# Patient Record
Sex: Female | Born: 1935 | Race: White | Hispanic: No | Smoking: Never smoker
Health system: Southern US, Community
[De-identification: ages and names within clinical notes are randomized; demographics above are authoritative.]

## PROBLEM LIST (undated history)

## (undated) DIAGNOSIS — C801 Malignant (primary) neoplasm, unspecified: Secondary | ICD-10-CM

## (undated) DIAGNOSIS — E785 Hyperlipidemia, unspecified: Secondary | ICD-10-CM

## (undated) DIAGNOSIS — Z973 Presence of spectacles and contact lenses: Secondary | ICD-10-CM

## (undated) DIAGNOSIS — K219 Gastro-esophageal reflux disease without esophagitis: Secondary | ICD-10-CM

## (undated) DIAGNOSIS — D649 Anemia, unspecified: Secondary | ICD-10-CM

## (undated) DIAGNOSIS — I10 Essential (primary) hypertension: Secondary | ICD-10-CM

## (undated) DIAGNOSIS — I2510 Atherosclerotic heart disease of native coronary artery without angina pectoris: Secondary | ICD-10-CM

## (undated) DIAGNOSIS — R0602 Shortness of breath: Secondary | ICD-10-CM

## (undated) DIAGNOSIS — I219 Acute myocardial infarction, unspecified: Secondary | ICD-10-CM

## (undated) HISTORY — PX: ABDOMINAL HYSTERECTOMY: SHX81

## (undated) HISTORY — DX: Acute myocardial infarction, unspecified: I21.9

## (undated) HISTORY — PX: APPENDECTOMY: SHX54

## (undated) HISTORY — DX: Hyperlipidemia, unspecified: E78.5

## (undated) HISTORY — DX: Anemia, unspecified: D64.9

## (undated) HISTORY — PX: TONSILLECTOMY: SUR1361

## (undated) HISTORY — PX: BACK SURGERY: SHX140

## (undated) HISTORY — PX: EYE SURGERY: SHX253

---

## 2001-05-16 HISTORY — PX: COLON SURGERY: SHX602

## 2001-08-14 HISTORY — PX: CORONARY ARTERY BYPASS GRAFT: SHX141

## 2007-06-19 ENCOUNTER — Ambulatory Visit: Payer: Self-pay | Admitting: Oncology

## 2007-06-28 LAB — CBC & DIFF AND RETIC
Basophils Absolute: 0 10*3/uL (ref 0.0–0.1)
Eosinophils Absolute: 0.1 10*3/uL (ref 0.0–0.5)
HGB: 8.9 g/dL — ABNORMAL LOW (ref 11.6–15.9)
IRF: 0.5 — ABNORMAL HIGH (ref 0.130–0.330)
MCV: 87.4 fL (ref 81.0–101.0)
MONO#: 0.8 10*3/uL (ref 0.1–0.9)
MONO%: 10.7 % (ref 0.0–13.0)
NEUT#: 4.3 10*3/uL (ref 1.5–6.5)
RDW: 20.5 % — ABNORMAL HIGH (ref 11.3–14.5)
RETIC #: 66 10*3/uL (ref 19.7–115.1)
Retic %: 2.3 % (ref 0.4–2.3)
WBC: 7.7 10*3/uL (ref 3.9–10.0)
lymph#: 2.4 10*3/uL (ref 0.9–3.3)

## 2007-06-28 LAB — CHCC SMEAR

## 2007-07-02 LAB — COMPREHENSIVE METABOLIC PANEL
ALT: 10 U/L (ref 0–35)
AST: 15 U/L (ref 0–37)
Albumin: 3.9 g/dL (ref 3.5–5.2)
Alkaline Phosphatase: 57 U/L (ref 39–117)
Calcium: 8.9 mg/dL (ref 8.4–10.5)
Chloride: 95 mEq/L — ABNORMAL LOW (ref 96–112)
Potassium: 5 mEq/L (ref 3.5–5.3)
Sodium: 128 mEq/L — ABNORMAL LOW (ref 135–145)
Total Protein: 7 g/dL (ref 6.0–8.3)

## 2007-07-02 LAB — IMMUNOFIXATION ELECTROPHORESIS
IgA: 1140 mg/dL — ABNORMAL HIGH (ref 68–378)
IgM, Serum: 8 mg/dL — ABNORMAL LOW (ref 60–263)
Total Protein, Serum Electrophoresis: 7 g/dL (ref 6.0–8.3)

## 2007-07-02 LAB — VITAMIN B12: Vitamin B-12: 506 pg/mL (ref 211–911)

## 2007-07-02 LAB — FERRITIN: Ferritin: 241 ng/mL (ref 10–291)

## 2007-07-19 LAB — CBC WITH DIFFERENTIAL/PLATELET
BASO%: 0.6 % (ref 0.0–2.0)
EOS%: 1.8 % (ref 0.0–7.0)
LYMPH%: 37.5 % (ref 14.0–48.0)
MCH: 30.4 pg (ref 26.0–34.0)
MCHC: 33.7 g/dL (ref 32.0–36.0)
MCV: 90.1 fL (ref 81.0–101.0)
MONO%: 8.5 % (ref 0.0–13.0)
Platelets: 236 10*3/uL (ref 145–400)
RBC: 3.31 10*6/uL — ABNORMAL LOW (ref 3.70–5.32)

## 2007-07-19 LAB — FECAL OCCULT BLOOD, GUAIAC: Occult Blood: NEGATIVE

## 2007-07-23 ENCOUNTER — Ambulatory Visit (HOSPITAL_COMMUNITY): Admission: RE | Admit: 2007-07-23 | Discharge: 2007-07-23 | Payer: Self-pay | Admitting: Oncology

## 2007-07-27 LAB — UIFE/LIGHT CHAINS/TP QN, 24-HR UR
Free Kappa Lt Chains,Ur: 5.39 mg/dL — ABNORMAL HIGH (ref 0.04–1.51)
Free Kappa/Lambda Ratio: 35.93 ratio — ABNORMAL HIGH (ref 0.46–4.00)
Free Lt Chn Excr Rate: 61.99 mg/d
Gamma Globulin, Urine: DETECTED — AB
Time: 24 hours
Total Protein, Urine: 6.1 mg/dL

## 2007-07-27 LAB — CREATININE CLEARANCE, URINE, 24 HOUR
Collection Interval-CRCL: 24 hours
Creatinine Clearance: 58 mL/min — ABNORMAL LOW (ref 75–115)
Creatinine, 24H Ur: 489 mg/d — ABNORMAL LOW (ref 700–1800)
Urine Total Volume-CRCL: 1150 mL

## 2007-08-16 ENCOUNTER — Ambulatory Visit: Payer: Self-pay | Admitting: Oncology

## 2007-08-20 LAB — CBC WITH DIFFERENTIAL/PLATELET
Basophils Absolute: 0 10*3/uL (ref 0.0–0.1)
EOS%: 1.8 % (ref 0.0–7.0)
HCT: 28.1 % — ABNORMAL LOW (ref 34.8–46.6)
HGB: 9.5 g/dL — ABNORMAL LOW (ref 11.6–15.9)
MCH: 30.1 pg (ref 26.0–34.0)
MONO#: 0.5 10*3/uL (ref 0.1–0.9)
NEUT#: 3.6 10*3/uL (ref 1.5–6.5)
NEUT%: 55.9 % (ref 39.6–76.8)
RDW: 20.7 % — ABNORMAL HIGH (ref 11.3–14.5)
WBC: 6.4 10*3/uL (ref 3.9–10.0)
lymph#: 2.2 10*3/uL (ref 0.9–3.3)

## 2007-08-20 LAB — ERYTHROCYTE SEDIMENTATION RATE: Sed Rate: 66 mm/hr — ABNORMAL HIGH (ref 0–30)

## 2007-08-22 LAB — COMPREHENSIVE METABOLIC PANEL
ALT: 10 U/L (ref 0–35)
Albumin: 4.1 g/dL (ref 3.5–5.2)
Alkaline Phosphatase: 46 U/L (ref 39–117)
CO2: 23 mEq/L (ref 19–32)
Potassium: 4.6 mEq/L (ref 3.5–5.3)
Sodium: 135 mEq/L (ref 135–145)
Total Bilirubin: 0.4 mg/dL (ref 0.3–1.2)
Total Protein: 7.1 g/dL (ref 6.0–8.3)

## 2007-08-22 LAB — IGG, IGA, IGM
IgA: 1250 mg/dL — ABNORMAL HIGH (ref 68–378)
IgM, Serum: 8 mg/dL — ABNORMAL LOW (ref 60–263)

## 2007-08-22 LAB — TRANSFERRIN RECEPTOR, SOLUABLE: Transferrin Receptor, Soluble: 43.5 nmol/L

## 2007-08-22 LAB — FERRITIN: Ferritin: 109 ng/mL (ref 10–291)

## 2007-08-22 LAB — IRON AND TIBC: %SAT: 28 % (ref 20–55)

## 2007-08-31 ENCOUNTER — Encounter (HOSPITAL_COMMUNITY): Payer: Self-pay | Admitting: Oncology

## 2007-08-31 ENCOUNTER — Ambulatory Visit (HOSPITAL_COMMUNITY): Admission: RE | Admit: 2007-08-31 | Discharge: 2007-08-31 | Payer: Self-pay | Admitting: Oncology

## 2007-08-31 ENCOUNTER — Ambulatory Visit: Payer: Self-pay | Admitting: Oncology

## 2007-09-20 LAB — CBC WITH DIFFERENTIAL/PLATELET
Basophils Absolute: 0 10*3/uL (ref 0.0–0.1)
Eosinophils Absolute: 0.1 10*3/uL (ref 0.0–0.5)
HGB: 9.3 g/dL — ABNORMAL LOW (ref 11.6–15.9)
MCV: 89.1 fL (ref 81.0–101.0)
MONO#: 0.6 10*3/uL (ref 0.1–0.9)
MONO%: 8.9 % (ref 0.0–13.0)
NEUT#: 3.5 10*3/uL (ref 1.5–6.5)
Platelets: 234 10*3/uL (ref 145–400)
RBC: 3.09 10*6/uL — ABNORMAL LOW (ref 3.70–5.32)
RDW: 20.4 % — ABNORMAL HIGH (ref 11.3–14.5)
WBC: 6.8 10*3/uL (ref 3.9–10.0)

## 2007-09-22 LAB — TRANSFERRIN RECEPTOR, SOLUABLE: Transferrin Receptor, Soluble: 44.1 nmol/L

## 2007-09-22 LAB — IRON AND TIBC
%SAT: 28 % (ref 20–55)
Iron: 77 ug/dL (ref 42–145)
UIBC: 197 ug/dL

## 2007-09-22 LAB — COMPREHENSIVE METABOLIC PANEL
Albumin: 3.9 g/dL (ref 3.5–5.2)
Alkaline Phosphatase: 61 U/L (ref 39–117)
BUN: 13 mg/dL (ref 6–23)
CO2: 22 mEq/L (ref 19–32)
Calcium: 8.8 mg/dL (ref 8.4–10.5)
Glucose, Bld: 109 mg/dL — ABNORMAL HIGH (ref 70–99)
Potassium: 5 mEq/L (ref 3.5–5.3)
Sodium: 132 mEq/L — ABNORMAL LOW (ref 135–145)
Total Protein: 7 g/dL (ref 6.0–8.3)

## 2007-09-22 LAB — FERRITIN: Ferritin: 114 ng/mL (ref 10–291)

## 2007-10-25 ENCOUNTER — Ambulatory Visit: Payer: Self-pay | Admitting: Oncology

## 2007-10-30 LAB — CBC WITH DIFFERENTIAL/PLATELET
Basophils Absolute: 0.1 10*3/uL (ref 0.0–0.1)
Eosinophils Absolute: 0.1 10*3/uL (ref 0.0–0.5)
HGB: 10.6 g/dL — ABNORMAL LOW (ref 11.6–15.9)
NEUT#: 3.4 10*3/uL (ref 1.5–6.5)
RBC: 3.49 10*6/uL — ABNORMAL LOW (ref 3.70–5.32)
RDW: 18 % — ABNORMAL HIGH (ref 11.3–14.5)
WBC: 6.6 10*3/uL (ref 3.9–10.0)
lymph#: 2.5 10*3/uL (ref 0.9–3.3)

## 2007-11-01 LAB — IGA: IgA: 1260 mg/dL — ABNORMAL HIGH (ref 68–378)

## 2007-11-01 LAB — COMPREHENSIVE METABOLIC PANEL
AST: 15 U/L (ref 0–37)
Alkaline Phosphatase: 52 U/L (ref 39–117)
BUN: 11 mg/dL (ref 6–23)
Creatinine, Ser: 0.64 mg/dL (ref 0.40–1.20)
Potassium: 4.8 mEq/L (ref 3.5–5.3)

## 2007-11-01 LAB — FERRITIN: Ferritin: 209 ng/mL (ref 10–291)

## 2007-11-01 LAB — IRON AND TIBC
%SAT: 31 % (ref 20–55)
Iron: 81 ug/dL (ref 42–145)
UIBC: 183 ug/dL

## 2007-11-01 LAB — TRANSFERRIN RECEPTOR, SOLUABLE: Transferrin Receptor, Soluble: 37 nmol/L

## 2007-11-22 LAB — CBC WITH DIFFERENTIAL/PLATELET
BASO%: 1.1 % (ref 0.0–2.0)
Basophils Absolute: 0.1 10*3/uL (ref 0.0–0.1)
EOS%: 1.8 % (ref 0.0–7.0)
HGB: 10.5 g/dL — ABNORMAL LOW (ref 11.6–15.9)
MCH: 30.7 pg (ref 26.0–34.0)
MCHC: 33.1 g/dL (ref 32.0–36.0)
MCV: 93 fL (ref 81.0–101.0)
MONO%: 8.2 % (ref 0.0–13.0)
RBC: 3.42 10*6/uL — ABNORMAL LOW (ref 3.70–5.32)
RDW: 18.2 % — ABNORMAL HIGH (ref 11.3–14.5)
lymph#: 2.1 10*3/uL (ref 0.9–3.3)

## 2007-11-22 LAB — COMPREHENSIVE METABOLIC PANEL
Albumin: 4 g/dL (ref 3.5–5.2)
Alkaline Phosphatase: 46 U/L (ref 39–117)
CO2: 23 mEq/L (ref 19–32)
Calcium: 9.1 mg/dL (ref 8.4–10.5)
Chloride: 102 mEq/L (ref 96–112)
Glucose, Bld: 158 mg/dL — ABNORMAL HIGH (ref 70–99)
Potassium: 4.4 mEq/L (ref 3.5–5.3)
Sodium: 134 mEq/L — ABNORMAL LOW (ref 135–145)
Total Protein: 7.1 g/dL (ref 6.0–8.3)

## 2007-11-22 LAB — IGA: IgA: 1150 mg/dL — ABNORMAL HIGH (ref 68–378)

## 2007-11-22 LAB — VITAMIN B12: Vitamin B-12: 353 pg/mL (ref 211–911)

## 2007-11-22 LAB — IRON AND TIBC: %SAT: 43 % (ref 20–55)

## 2007-11-22 LAB — LACTATE DEHYDROGENASE: LDH: 167 U/L (ref 94–250)

## 2007-12-17 ENCOUNTER — Ambulatory Visit: Payer: Self-pay | Admitting: Oncology

## 2007-12-20 LAB — CBC WITH DIFFERENTIAL/PLATELET
Basophils Absolute: 0 10*3/uL (ref 0.0–0.1)
EOS%: 0.9 % (ref 0.0–7.0)
Eosinophils Absolute: 0.1 10*3/uL (ref 0.0–0.5)
HGB: 10.8 g/dL — ABNORMAL LOW (ref 11.6–15.9)
MCV: 93.2 fL (ref 81.0–101.0)
MONO%: 12.1 % (ref 0.0–13.0)
NEUT#: 3.7 10*3/uL (ref 1.5–6.5)
RBC: 3.41 10*6/uL — ABNORMAL LOW (ref 3.70–5.32)
RDW: 19.8 % — ABNORMAL HIGH (ref 11.3–14.5)
lymph#: 1.8 10*3/uL (ref 0.9–3.3)

## 2007-12-20 LAB — VITAMIN B12: Vitamin B-12: 345 pg/mL (ref 211–911)

## 2007-12-20 LAB — COMPREHENSIVE METABOLIC PANEL
AST: 16 U/L (ref 0–37)
Albumin: 4.2 g/dL (ref 3.5–5.2)
Alkaline Phosphatase: 49 U/L (ref 39–117)
BUN: 15 mg/dL (ref 6–23)
Calcium: 8.7 mg/dL (ref 8.4–10.5)
Chloride: 98 mEq/L (ref 96–112)
Glucose, Bld: 138 mg/dL — ABNORMAL HIGH (ref 70–99)
Potassium: 4.3 mEq/L (ref 3.5–5.3)
Sodium: 128 mEq/L — ABNORMAL LOW (ref 135–145)
Total Protein: 7.4 g/dL (ref 6.0–8.3)

## 2007-12-20 LAB — FERRITIN: Ferritin: 589 ng/mL — ABNORMAL HIGH (ref 10–291)

## 2007-12-20 LAB — IRON AND TIBC
%SAT: 28 % (ref 20–55)
Iron: 69 ug/dL (ref 42–145)
TIBC: 246 ug/dL — ABNORMAL LOW (ref 250–470)

## 2008-01-18 LAB — COMPREHENSIVE METABOLIC PANEL
BUN: 9 mg/dL (ref 6–23)
CO2: 23 mEq/L (ref 19–32)
Calcium: 9.2 mg/dL (ref 8.4–10.5)
Chloride: 103 mEq/L (ref 96–112)
Creatinine, Ser: 0.66 mg/dL (ref 0.40–1.20)
Glucose, Bld: 207 mg/dL — ABNORMAL HIGH (ref 70–99)
Total Bilirubin: 0.4 mg/dL (ref 0.3–1.2)

## 2008-01-18 LAB — CBC WITH DIFFERENTIAL/PLATELET
Basophils Absolute: 0 10*3/uL (ref 0.0–0.1)
EOS%: 2.4 % (ref 0.0–7.0)
Eosinophils Absolute: 0.1 10*3/uL (ref 0.0–0.5)
HCT: 30.3 % — ABNORMAL LOW (ref 34.8–46.6)
HGB: 10.4 g/dL — ABNORMAL LOW (ref 11.6–15.9)
MCH: 32.5 pg (ref 26.0–34.0)
MONO#: 0.4 10*3/uL (ref 0.1–0.9)
NEUT#: 3 10*3/uL (ref 1.5–6.5)
NEUT%: 57.5 % (ref 39.6–76.8)
RDW: 19.3 % — ABNORMAL HIGH (ref 11.3–14.5)
lymph#: 1.6 10*3/uL (ref 0.9–3.3)

## 2008-01-18 LAB — LACTATE DEHYDROGENASE: LDH: 165 U/L (ref 94–250)

## 2008-01-18 LAB — IGA: IgA: 1090 mg/dL — ABNORMAL HIGH (ref 68–378)

## 2008-02-13 ENCOUNTER — Ambulatory Visit: Payer: Self-pay | Admitting: Oncology

## 2008-03-13 LAB — CBC WITH DIFFERENTIAL/PLATELET
Eosinophils Absolute: 0.1 10*3/uL (ref 0.0–0.5)
HCT: 27.8 % — ABNORMAL LOW (ref 34.8–46.6)
LYMPH%: 27.6 % (ref 14.0–48.0)
MONO#: 0.4 10*3/uL (ref 0.1–0.9)
NEUT#: 3.5 10*3/uL (ref 1.5–6.5)
NEUT%: 64 % (ref 39.6–76.8)
Platelets: 170 10*3/uL (ref 145–400)
WBC: 5.5 10*3/uL (ref 3.9–10.0)

## 2008-03-13 LAB — COMPREHENSIVE METABOLIC PANEL
ALT: 12 U/L (ref 0–35)
Albumin: 4.1 g/dL (ref 3.5–5.2)
Alkaline Phosphatase: 55 U/L (ref 39–117)
CO2: 23 mEq/L (ref 19–32)
Glucose, Bld: 206 mg/dL — ABNORMAL HIGH (ref 70–99)
Potassium: 4.6 mEq/L (ref 3.5–5.3)
Sodium: 134 mEq/L — ABNORMAL LOW (ref 135–145)
Total Protein: 7 g/dL (ref 6.0–8.3)

## 2008-03-13 LAB — IRON AND TIBC: UIBC: 141 ug/dL

## 2008-03-13 LAB — FERRITIN: Ferritin: 586 ng/mL — ABNORMAL HIGH (ref 10–291)

## 2008-03-13 LAB — LACTATE DEHYDROGENASE: LDH: 157 U/L (ref 94–250)

## 2008-03-13 LAB — IGA: IgA: 1240 mg/dL — ABNORMAL HIGH (ref 68–378)

## 2008-04-08 ENCOUNTER — Ambulatory Visit: Payer: Self-pay | Admitting: Oncology

## 2008-05-13 LAB — COMPREHENSIVE METABOLIC PANEL
Albumin: 4.2 g/dL (ref 3.5–5.2)
Alkaline Phosphatase: 57 U/L (ref 39–117)
BUN: 12 mg/dL (ref 6–23)
CO2: 22 mEq/L (ref 19–32)
Calcium: 8.7 mg/dL (ref 8.4–10.5)
Chloride: 103 mEq/L (ref 96–112)
Glucose, Bld: 202 mg/dL — ABNORMAL HIGH (ref 70–99)
Potassium: 4.4 mEq/L (ref 3.5–5.3)
Total Protein: 7.6 g/dL (ref 6.0–8.3)

## 2008-05-13 LAB — IRON AND TIBC
Iron: 79 ug/dL (ref 42–145)
UIBC: 160 ug/dL

## 2008-05-13 LAB — CBC WITH DIFFERENTIAL/PLATELET
Basophils Absolute: 0 10*3/uL (ref 0.0–0.1)
Eosinophils Absolute: 0.1 10*3/uL (ref 0.0–0.5)
HGB: 10.8 g/dL — ABNORMAL LOW (ref 11.6–15.9)
MCV: 95.9 fL (ref 81.0–101.0)
MONO#: 0.4 10*3/uL (ref 0.1–0.9)
MONO%: 7.4 % (ref 0.0–13.0)
NEUT#: 3.1 10*3/uL (ref 1.5–6.5)
RDW: 17.7 % — ABNORMAL HIGH (ref 11.3–14.5)
WBC: 5.2 10*3/uL (ref 3.9–10.0)

## 2008-05-13 LAB — LACTATE DEHYDROGENASE: LDH: 148 U/L (ref 94–250)

## 2008-05-13 LAB — FERRITIN: Ferritin: 449 ng/mL — ABNORMAL HIGH (ref 10–291)

## 2008-05-14 ENCOUNTER — Ambulatory Visit (HOSPITAL_COMMUNITY): Admission: RE | Admit: 2008-05-14 | Discharge: 2008-05-14 | Payer: Self-pay | Admitting: Oncology

## 2008-05-30 ENCOUNTER — Ambulatory Visit: Payer: Self-pay | Admitting: Oncology

## 2008-06-30 LAB — COMPREHENSIVE METABOLIC PANEL
ALT: 13 U/L (ref 0–35)
AST: 15 U/L (ref 0–37)
Albumin: 4.4 g/dL (ref 3.5–5.2)
Alkaline Phosphatase: 62 U/L (ref 39–117)
BUN: 13 mg/dL (ref 6–23)
CO2: 23 mEq/L (ref 19–32)
Calcium: 9.5 mg/dL (ref 8.4–10.5)
Creatinine, Ser: 0.77 mg/dL (ref 0.40–1.20)
Glucose, Bld: 186 mg/dL — ABNORMAL HIGH (ref 70–99)
Sodium: 136 mEq/L (ref 135–145)
Total Bilirubin: 0.4 mg/dL (ref 0.3–1.2)

## 2008-06-30 LAB — CBC WITH DIFFERENTIAL/PLATELET
Eosinophils Absolute: 0.1 10*3/uL (ref 0.0–0.5)
HCT: 30.6 % — ABNORMAL LOW (ref 34.8–46.6)
LYMPH%: 38.5 % (ref 14.0–48.0)
MCV: 94.6 fL (ref 81.0–101.0)
MONO#: 0.4 10*3/uL (ref 0.1–0.9)
MONO%: 8.4 % (ref 0.0–13.0)
NEUT#: 2.8 10*3/uL (ref 1.5–6.5)
NEUT%: 51.6 % (ref 39.6–76.8)
Platelets: 173 10*3/uL (ref 145–400)
RBC: 3.24 10*6/uL — ABNORMAL LOW (ref 3.70–5.32)

## 2008-06-30 LAB — LACTATE DEHYDROGENASE: LDH: 141 U/L (ref 94–250)

## 2008-07-25 ENCOUNTER — Ambulatory Visit: Payer: Self-pay | Admitting: Oncology

## 2008-07-29 LAB — CBC WITH DIFFERENTIAL/PLATELET
Basophils Absolute: 0 10*3/uL (ref 0.0–0.1)
Eosinophils Absolute: 0.1 10*3/uL (ref 0.0–0.5)
HCT: 29.2 % — ABNORMAL LOW (ref 34.8–46.6)
HGB: 9.8 g/dL — ABNORMAL LOW (ref 11.6–15.9)
LYMPH%: 35.3 % (ref 14.0–49.7)
MCV: 94.8 fL (ref 79.5–101.0)
MONO%: 9.2 % (ref 0.0–14.0)
NEUT#: 3.1 10*3/uL (ref 1.5–6.5)
NEUT%: 53.7 % (ref 38.4–76.8)
Platelets: 177 10*3/uL (ref 145–400)

## 2008-07-29 LAB — COMPREHENSIVE METABOLIC PANEL
ALT: 17 U/L (ref 0–35)
AST: 15 U/L (ref 0–37)
Albumin: 4.2 g/dL (ref 3.5–5.2)
Alkaline Phosphatase: 58 U/L (ref 39–117)
Calcium: 9.3 mg/dL (ref 8.4–10.5)
Chloride: 97 mEq/L (ref 96–112)
Potassium: 4.7 mEq/L (ref 3.5–5.3)

## 2008-08-26 LAB — COMPREHENSIVE METABOLIC PANEL
ALT: 17 U/L (ref 0–35)
Albumin: 3.5 g/dL (ref 3.5–5.2)
CO2: 26 mEq/L (ref 19–32)
Calcium: 9.1 mg/dL (ref 8.4–10.5)
Chloride: 97 mEq/L (ref 96–112)
Creatinine, Ser: 0.71 mg/dL (ref 0.40–1.20)
Total Protein: 7.6 g/dL (ref 6.0–8.3)

## 2008-08-26 LAB — CBC WITH DIFFERENTIAL/PLATELET
Basophils Absolute: 0 10*3/uL (ref 0.0–0.1)
EOS%: 2.2 % (ref 0.0–7.0)
Eosinophils Absolute: 0.1 10*3/uL (ref 0.0–0.5)
HCT: 27 % — ABNORMAL LOW (ref 34.8–46.6)
HGB: 9.3 g/dL — ABNORMAL LOW (ref 11.6–15.9)
MCH: 32.4 pg (ref 25.1–34.0)
MCV: 93.6 fL (ref 79.5–101.0)
MONO%: 8.6 % (ref 0.0–14.0)
NEUT%: 52.1 % (ref 38.4–76.8)

## 2008-08-27 LAB — IGA: IgA: 1630 mg/dL — ABNORMAL HIGH (ref 68–378)

## 2008-08-27 LAB — KAPPA/LAMBDA LIGHT CHAINS: Lambda Free Lght Chn: 0.74 mg/dL (ref 0.57–2.63)

## 2008-08-31 ENCOUNTER — Emergency Department (HOSPITAL_COMMUNITY): Admission: EM | Admit: 2008-08-31 | Discharge: 2008-08-31 | Payer: Self-pay | Admitting: Emergency Medicine

## 2008-09-23 ENCOUNTER — Ambulatory Visit: Payer: Self-pay | Admitting: Oncology

## 2008-09-25 LAB — CBC WITH DIFFERENTIAL/PLATELET
BASO%: 0.5 % (ref 0.0–2.0)
MCHC: 33.7 g/dL (ref 31.5–36.0)
MONO#: 0.5 10*3/uL (ref 0.1–0.9)
RBC: 2.77 10*6/uL — ABNORMAL LOW (ref 3.70–5.45)
WBC: 5.4 10*3/uL (ref 3.9–10.3)
lymph#: 1.7 10*3/uL (ref 0.9–3.3)

## 2008-09-26 LAB — COMPREHENSIVE METABOLIC PANEL
Albumin: 4 g/dL (ref 3.5–5.2)
Alkaline Phosphatase: 50 U/L (ref 39–117)
BUN: 7 mg/dL (ref 6–23)
CO2: 24 mEq/L (ref 19–32)
Chloride: 100 mEq/L (ref 96–112)
Creatinine, Ser: 0.64 mg/dL (ref 0.40–1.20)
Glucose, Bld: 156 mg/dL — ABNORMAL HIGH (ref 70–99)
Sodium: 134 mEq/L — ABNORMAL LOW (ref 135–145)

## 2008-09-26 LAB — IGA: IgA: 1540 mg/dL — ABNORMAL HIGH (ref 68–378)

## 2008-09-26 LAB — BETA 2 MICROGLOBULIN, SERUM: Beta-2 Microglobulin: 3.42 mg/L — ABNORMAL HIGH (ref 1.01–1.73)

## 2008-10-16 LAB — CBC WITH DIFFERENTIAL/PLATELET
Basophils Absolute: 0.1 10*3/uL (ref 0.0–0.1)
EOS%: 1.8 % (ref 0.0–7.0)
Eosinophils Absolute: 0.1 10*3/uL (ref 0.0–0.5)
HGB: 10.2 g/dL — ABNORMAL LOW (ref 11.6–15.9)
LYMPH%: 34.7 % (ref 14.0–49.7)
MCH: 30.3 pg (ref 25.1–34.0)
MCV: 94.1 fL (ref 79.5–101.0)
MONO%: 8.3 % (ref 0.0–14.0)
NEUT#: 4 10*3/uL (ref 1.5–6.5)
Platelets: 148 10*3/uL (ref 145–400)
RBC: 3.37 10*6/uL — ABNORMAL LOW (ref 3.70–5.45)

## 2008-11-03 ENCOUNTER — Ambulatory Visit (HOSPITAL_COMMUNITY): Admission: RE | Admit: 2008-11-03 | Discharge: 2008-11-03 | Payer: Self-pay | Admitting: Oncology

## 2008-11-04 ENCOUNTER — Ambulatory Visit: Payer: Self-pay | Admitting: Oncology

## 2008-11-06 LAB — CBC WITH DIFFERENTIAL/PLATELET
BASO%: 0.5 % (ref 0.0–2.0)
EOS%: 0.8 % (ref 0.0–7.0)
HCT: 30.8 % — ABNORMAL LOW (ref 34.8–46.6)
LYMPH%: 25.2 % (ref 14.0–49.7)
MCH: 29.6 pg (ref 25.1–34.0)
MCHC: 32.1 g/dL (ref 31.5–36.0)
MCV: 92.2 fL (ref 79.5–101.0)
MONO#: 0.6 10*3/uL (ref 0.1–0.9)
MONO%: 10.3 % (ref 0.0–14.0)
NEUT%: 63.2 % (ref 38.4–76.8)
Platelets: 120 10*3/uL — ABNORMAL LOW (ref 145–400)

## 2008-11-07 LAB — COMPREHENSIVE METABOLIC PANEL
ALT: 12 U/L (ref 0–35)
CO2: 22 mEq/L (ref 19–32)
Creatinine, Ser: 0.62 mg/dL (ref 0.40–1.20)
Total Bilirubin: 0.4 mg/dL (ref 0.3–1.2)

## 2008-11-07 LAB — LACTATE DEHYDROGENASE: LDH: 136 U/L (ref 94–250)

## 2008-11-07 LAB — KAPPA/LAMBDA LIGHT CHAINS
Kappa:Lambda Ratio: 15.12 — ABNORMAL HIGH (ref 0.26–1.65)
Lambda Free Lght Chn: 0.84 mg/dL (ref 0.57–2.63)

## 2008-11-10 LAB — UIFE/LIGHT CHAINS/TP QN, 24-HR UR
Free Kappa Lt Chains,Ur: 2.07 mg/dL — ABNORMAL HIGH (ref 0.04–1.51)
Free Kappa/Lambda Ratio: 34.5 ratio — ABNORMAL HIGH (ref 0.46–4.00)
Free Lt Chn Excr Rate: 32.09 mg/d
Total Protein, Urine: 2.6 mg/dL

## 2008-11-10 LAB — CREATININE CLEARANCE, URINE, 24 HOUR
Collection Interval-CRCL: 24 hours
Creatinine Clearance: 65 mL/min — ABNORMAL LOW (ref 75–115)
Creatinine, 24H Ur: 559 mg/d — ABNORMAL LOW (ref 700–1800)
Creatinine, Urine: 36.1 mg/dL

## 2008-11-27 LAB — CBC WITH DIFFERENTIAL/PLATELET
BASO%: 0.3 % (ref 0.0–2.0)
Basophils Absolute: 0 10*3/uL (ref 0.0–0.1)
EOS%: 1.8 % (ref 0.0–7.0)
HCT: 30.9 % — ABNORMAL LOW (ref 34.8–46.6)
HGB: 10.4 g/dL — ABNORMAL LOW (ref 11.6–15.9)
LYMPH%: 39 % (ref 14.0–49.7)
MCH: 30.8 pg (ref 25.1–34.0)
MCHC: 33.7 g/dL (ref 31.5–36.0)
NEUT%: 49 % (ref 38.4–76.8)
Platelets: 157 10*3/uL (ref 145–400)
lymph#: 1.9 10*3/uL (ref 0.9–3.3)

## 2008-12-16 ENCOUNTER — Ambulatory Visit: Payer: Self-pay | Admitting: Oncology

## 2008-12-18 LAB — CBC WITH DIFFERENTIAL/PLATELET
BASO%: 0.4 % (ref 0.0–2.0)
Basophils Absolute: 0 10*3/uL (ref 0.0–0.1)
HCT: 30 % — ABNORMAL LOW (ref 34.8–46.6)
HGB: 10.2 g/dL — ABNORMAL LOW (ref 11.6–15.9)
MCHC: 34 g/dL (ref 31.5–36.0)
MONO#: 0.3 10*3/uL (ref 0.1–0.9)
NEUT#: 3 10*3/uL (ref 1.5–6.5)
NEUT%: 56.7 % (ref 38.4–76.8)
WBC: 5.3 10*3/uL (ref 3.9–10.3)
lymph#: 1.8 10*3/uL (ref 0.9–3.3)

## 2008-12-18 LAB — COMPREHENSIVE METABOLIC PANEL
ALT: 12 U/L (ref 0–35)
CO2: 23 mEq/L (ref 19–32)
Calcium: 9.5 mg/dL (ref 8.4–10.5)
Chloride: 97 mEq/L (ref 96–112)
Creatinine, Ser: 0.64 mg/dL (ref 0.40–1.20)

## 2008-12-18 LAB — KAPPA/LAMBDA LIGHT CHAINS: Kappa:Lambda Ratio: 20.21 — ABNORMAL HIGH (ref 0.26–1.65)

## 2008-12-18 LAB — LACTATE DEHYDROGENASE: LDH: 188 U/L (ref 94–250)

## 2009-01-08 LAB — CBC WITH DIFFERENTIAL/PLATELET
BASO%: 0.4 % (ref 0.0–2.0)
Basophils Absolute: 0 10*3/uL (ref 0.0–0.1)
Eosinophils Absolute: 0.1 10*3/uL (ref 0.0–0.5)
HCT: 27.8 % — ABNORMAL LOW (ref 34.8–46.6)
HGB: 9.4 g/dL — ABNORMAL LOW (ref 11.6–15.9)
LYMPH%: 37.7 % (ref 14.0–49.7)
MONO#: 0.5 10*3/uL (ref 0.1–0.9)
NEUT#: 3.1 10*3/uL (ref 1.5–6.5)
NEUT%: 51.8 % (ref 38.4–76.8)
Platelets: 130 10*3/uL — ABNORMAL LOW (ref 145–400)
WBC: 6 10*3/uL (ref 3.9–10.3)
lymph#: 2.3 10*3/uL (ref 0.9–3.3)

## 2009-01-27 ENCOUNTER — Ambulatory Visit: Payer: Self-pay | Admitting: Oncology

## 2009-01-29 LAB — CBC WITH DIFFERENTIAL/PLATELET
BASO%: 2.4 % — ABNORMAL HIGH (ref 0.0–2.0)
Eosinophils Absolute: 0.1 10*3/uL (ref 0.0–0.5)
HCT: 29.7 % — ABNORMAL LOW (ref 34.8–46.6)
MCHC: 32.3 g/dL (ref 31.5–36.0)
MONO#: 0.5 10*3/uL (ref 0.1–0.9)
NEUT#: 2.5 10*3/uL (ref 1.5–6.5)
NEUT%: 50 % (ref 38.4–76.8)
RBC: 3.3 10*6/uL — ABNORMAL LOW (ref 3.70–5.45)
WBC: 5.1 10*3/uL (ref 3.9–10.3)
lymph#: 1.8 10*3/uL (ref 0.9–3.3)
nRBC: 9 % — ABNORMAL HIGH (ref 0–0)

## 2009-01-30 LAB — COMPREHENSIVE METABOLIC PANEL
ALT: 14 U/L (ref 0–35)
AST: 19 U/L (ref 0–37)
Albumin: 4.2 g/dL (ref 3.5–5.2)
Alkaline Phosphatase: 54 U/L (ref 39–117)
Potassium: 4.7 mEq/L (ref 3.5–5.3)
Sodium: 133 mEq/L — ABNORMAL LOW (ref 135–145)
Total Protein: 7.4 g/dL (ref 6.0–8.3)

## 2009-01-30 LAB — VITAMIN B12: Vitamin B-12: 374 pg/mL (ref 211–911)

## 2009-02-19 LAB — CBC WITH DIFFERENTIAL/PLATELET
Eosinophils Absolute: 0.1 10*3/uL (ref 0.0–0.5)
HCT: 28.4 % — ABNORMAL LOW (ref 34.8–46.6)
LYMPH%: 35.5 % (ref 14.0–49.7)
MCHC: 33.2 g/dL (ref 31.5–36.0)
MCV: 89.8 fL (ref 79.5–101.0)
MONO%: 6.1 % (ref 0.0–14.0)
NEUT#: 2.9 10*3/uL (ref 1.5–6.5)
NEUT%: 56.5 % (ref 38.4–76.8)
Platelets: 130 10*3/uL — ABNORMAL LOW (ref 145–400)
RBC: 3.16 10*6/uL — ABNORMAL LOW (ref 3.70–5.45)

## 2009-03-09 ENCOUNTER — Ambulatory Visit: Payer: Self-pay | Admitting: Oncology

## 2009-03-12 LAB — CBC WITH DIFFERENTIAL/PLATELET
BASO%: 0.7 % (ref 0.0–2.0)
EOS%: 1.4 % (ref 0.0–7.0)
HCT: 29 % — ABNORMAL LOW (ref 34.8–46.6)
LYMPH%: 33 % (ref 14.0–49.7)
MCH: 30.3 pg (ref 25.1–34.0)
MCHC: 33.4 g/dL (ref 31.5–36.0)
MCV: 90.7 fL (ref 79.5–101.0)
MONO#: 0.4 10*3/uL (ref 0.1–0.9)
MONO%: 7.9 % (ref 0.0–14.0)
NEUT%: 57 % (ref 38.4–76.8)
Platelets: 159 10*3/uL (ref 145–400)
RBC: 3.2 10*6/uL — ABNORMAL LOW (ref 3.70–5.45)
WBC: 5.4 10*3/uL (ref 3.9–10.3)

## 2009-03-12 LAB — COMPREHENSIVE METABOLIC PANEL
AST: 15 U/L (ref 0–37)
Albumin: 4.3 g/dL (ref 3.5–5.2)
Alkaline Phosphatase: 56 U/L (ref 39–117)
BUN: 10 mg/dL (ref 6–23)
Potassium: 4.4 mEq/L (ref 3.5–5.3)

## 2009-03-12 LAB — KAPPA/LAMBDA LIGHT CHAINS
Kappa:Lambda Ratio: 11.76 — ABNORMAL HIGH (ref 0.26–1.65)
Lambda Free Lght Chn: 0.83 mg/dL (ref 0.57–2.63)

## 2009-03-12 LAB — VITAMIN B12: Vitamin B-12: 384 pg/mL (ref 211–911)

## 2009-04-02 ENCOUNTER — Ambulatory Visit: Payer: Self-pay | Admitting: Oncology

## 2009-04-07 LAB — CBC WITH DIFFERENTIAL/PLATELET
Basophils Absolute: 0 10*3/uL (ref 0.0–0.1)
Eosinophils Absolute: 0.1 10*3/uL (ref 0.0–0.5)
HGB: 9.5 g/dL — ABNORMAL LOW (ref 11.6–15.9)
LYMPH%: 35.1 % (ref 14.0–49.7)
MCV: 91.1 fL (ref 79.5–101.0)
MONO#: 0.3 10*3/uL (ref 0.1–0.9)
MONO%: 6.4 % (ref 0.0–14.0)
NEUT#: 3.1 10*3/uL (ref 1.5–6.5)
Platelets: 167 10*3/uL (ref 145–400)

## 2009-05-05 ENCOUNTER — Ambulatory Visit: Payer: Self-pay | Admitting: Oncology

## 2009-05-05 LAB — CBC WITH DIFFERENTIAL/PLATELET
Basophils Absolute: 0 10*3/uL (ref 0.0–0.1)
Eosinophils Absolute: 0.1 10*3/uL (ref 0.0–0.5)
HCT: 28.3 % — ABNORMAL LOW (ref 34.8–46.6)
HGB: 9.4 g/dL — ABNORMAL LOW (ref 11.6–15.9)
LYMPH%: 35 % (ref 14.0–49.7)
MONO#: 0.4 10*3/uL (ref 0.1–0.9)
NEUT#: 2.9 10*3/uL (ref 1.5–6.5)
NEUT%: 55.4 % (ref 38.4–76.8)
Platelets: 151 10*3/uL (ref 145–400)
WBC: 5.2 10*3/uL (ref 3.9–10.3)
lymph#: 1.8 10*3/uL (ref 0.9–3.3)

## 2009-05-05 LAB — COMPREHENSIVE METABOLIC PANEL
ALT: 12 U/L (ref 0–35)
Albumin: 4.1 g/dL (ref 3.5–5.2)
CO2: 22 mEq/L (ref 19–32)
Calcium: 9.1 mg/dL (ref 8.4–10.5)
Chloride: 100 mEq/L (ref 96–112)
Potassium: 4.5 mEq/L (ref 3.5–5.3)
Sodium: 135 mEq/L (ref 135–145)
Total Protein: 7.7 g/dL (ref 6.0–8.3)

## 2009-05-05 LAB — IGG, IGA, IGM: IgG (Immunoglobin G), Serum: 408 mg/dL — ABNORMAL LOW (ref 694–1618)

## 2009-06-02 LAB — CBC WITH DIFFERENTIAL/PLATELET
Basophils Absolute: 0 10*3/uL (ref 0.0–0.1)
EOS%: 1.1 % (ref 0.0–7.0)
HGB: 9.1 g/dL — ABNORMAL LOW (ref 11.6–15.9)
MCH: 30.2 pg (ref 25.1–34.0)
MONO#: 0.3 10*3/uL (ref 0.1–0.9)
NEUT#: 2.9 10*3/uL (ref 1.5–6.5)
RDW: 21.9 % — ABNORMAL HIGH (ref 11.2–14.5)
WBC: 5.3 10*3/uL (ref 3.9–10.3)
lymph#: 2 10*3/uL (ref 0.9–3.3)

## 2009-06-03 LAB — IGG, IGA, IGM: IgA: 1850 mg/dL — ABNORMAL HIGH (ref 68–378)

## 2009-06-03 LAB — COMPREHENSIVE METABOLIC PANEL
ALT: 10 U/L (ref 0–35)
AST: 15 U/L (ref 0–37)
Albumin: 4.3 g/dL (ref 3.5–5.2)
BUN: 14 mg/dL (ref 6–23)
Calcium: 8.9 mg/dL (ref 8.4–10.5)
Chloride: 97 mEq/L (ref 96–112)
Potassium: 4.6 mEq/L (ref 3.5–5.3)

## 2009-06-03 LAB — KAPPA/LAMBDA LIGHT CHAINS: Kappa:Lambda Ratio: 23.88 — ABNORMAL HIGH (ref 0.26–1.65)

## 2009-06-26 ENCOUNTER — Ambulatory Visit: Payer: Self-pay | Admitting: Oncology

## 2009-06-30 LAB — CBC WITH DIFFERENTIAL/PLATELET
BASO%: 0.5 % (ref 0.0–2.0)
EOS%: 1.7 % (ref 0.0–7.0)
HCT: 28.4 % — ABNORMAL LOW (ref 34.8–46.6)
LYMPH%: 35.9 % (ref 14.0–49.7)
MCH: 30.3 pg (ref 25.1–34.0)
MCHC: 33.6 g/dL (ref 31.5–36.0)
MONO#: 0.4 10*3/uL (ref 0.1–0.9)
NEUT%: 54.9 % (ref 38.4–76.8)
Platelets: 166 10*3/uL (ref 145–400)
RBC: 3.15 10*6/uL — ABNORMAL LOW (ref 3.70–5.45)
WBC: 5.3 10*3/uL (ref 3.9–10.3)

## 2009-07-01 LAB — COMPREHENSIVE METABOLIC PANEL
ALT: 11 U/L (ref 0–35)
AST: 17 U/L (ref 0–37)
Alkaline Phosphatase: 57 U/L (ref 39–117)
BUN: 10 mg/dL (ref 6–23)
Creatinine, Ser: 0.61 mg/dL (ref 0.40–1.20)
Total Bilirubin: 0.5 mg/dL (ref 0.3–1.2)

## 2009-07-01 LAB — LACTATE DEHYDROGENASE: LDH: 219 U/L (ref 94–250)

## 2009-07-01 LAB — KAPPA/LAMBDA LIGHT CHAINS: Lambda Free Lght Chn: <0.47 mg/dL — ABNORMAL LOW (ref 0.57–2.63)

## 2009-07-01 LAB — VITAMIN B12: Vitamin B-12: 450 pg/mL (ref 211–911)

## 2009-07-24 ENCOUNTER — Ambulatory Visit: Payer: Self-pay | Admitting: Oncology

## 2009-07-28 LAB — COMPREHENSIVE METABOLIC PANEL
ALT: 12 U/L (ref 0–35)
AST: 17 U/L (ref 0–37)
Albumin: 4.2 g/dL (ref 3.5–5.2)
BUN: 10 mg/dL (ref 6–23)
Calcium: 8.7 mg/dL (ref 8.4–10.5)
Chloride: 100 mEq/L (ref 96–112)
Glucose, Bld: 155 mg/dL — ABNORMAL HIGH (ref 70–99)
Potassium: 4.4 mEq/L (ref 3.5–5.3)
Sodium: 133 mEq/L — ABNORMAL LOW (ref 135–145)
Total Bilirubin: 0.5 mg/dL (ref 0.3–1.2)
Total Protein: 7.2 g/dL (ref 6.0–8.3)

## 2009-07-28 LAB — CBC WITH DIFFERENTIAL/PLATELET
Eosinophils Absolute: 0.1 10*3/uL (ref 0.0–0.5)
HCT: 27.5 % — ABNORMAL LOW (ref 34.8–46.6)
HGB: 8.8 g/dL — ABNORMAL LOW (ref 11.6–15.9)
LYMPH%: 33.5 % (ref 14.0–49.7)
MONO#: 0.5 10*3/uL (ref 0.1–0.9)
NEUT#: 2.4 10*3/uL (ref 1.5–6.5)
NEUT%: 52 % (ref 38.4–76.8)
Platelets: 110 10*3/uL — ABNORMAL LOW (ref 145–400)
WBC: 4.7 10*3/uL (ref 3.9–10.3)

## 2009-07-28 LAB — VITAMIN B12: Vitamin B-12: 443 pg/mL (ref 211–911)

## 2009-07-29 LAB — KAPPA/LAMBDA LIGHT CHAINS: Kappa free light chain: 11.3 mg/dL — ABNORMAL HIGH (ref 0.33–1.94)

## 2009-08-05 ENCOUNTER — Ambulatory Visit: Payer: Self-pay | Admitting: Oncology

## 2009-08-05 ENCOUNTER — Ambulatory Visit (HOSPITAL_COMMUNITY): Admission: RE | Admit: 2009-08-05 | Discharge: 2009-08-05 | Payer: Self-pay | Admitting: Oncology

## 2009-08-11 LAB — CBC WITH DIFFERENTIAL/PLATELET
BASO%: 1.8 % (ref 0.0–2.0)
EOS%: 1.1 % (ref 0.0–7.0)
HCT: 26.9 % — ABNORMAL LOW (ref 34.8–46.6)
LYMPH%: 34.9 % (ref 14.0–49.7)
MCH: 28.4 pg (ref 25.1–34.0)
MCHC: 32 g/dL (ref 31.5–36.0)
MCV: 88.8 fL (ref 79.5–101.0)
MONO#: 0.7 10*3/uL (ref 0.1–0.9)
MONO%: 11.3 % (ref 0.0–14.0)
NEUT%: 50.9 % (ref 38.4–76.8)
Platelets: 109 10*3/uL — ABNORMAL LOW (ref 145–400)
RBC: 3.03 10*6/uL — ABNORMAL LOW (ref 3.70–5.45)

## 2009-08-11 LAB — COMPREHENSIVE METABOLIC PANEL
Albumin: 4.1 g/dL (ref 3.5–5.2)
BUN: 11 mg/dL (ref 6–23)
CO2: 20 mEq/L (ref 19–32)
Glucose, Bld: 122 mg/dL — ABNORMAL HIGH (ref 70–99)
Sodium: 130 mEq/L — ABNORMAL LOW (ref 135–145)
Total Bilirubin: 0.6 mg/dL (ref 0.3–1.2)
Total Protein: 7.7 g/dL (ref 6.0–8.3)

## 2009-08-11 LAB — IGA: IgA: 2080 mg/dL — ABNORMAL HIGH (ref 68–378)

## 2009-08-13 LAB — CREATININE CLEARANCE, URINE, 24 HOUR
Creatinine, Urine: 45.5 mg/dL
Creatinine: 0.69 mg/dL (ref 0.40–1.20)
Urine Total Volume-CRCL: 1200 mL

## 2009-08-13 LAB — UIFE/LIGHT CHAINS/TP QN, 24-HR UR
Albumin, U: DETECTED
Alpha 1, Urine: DETECTED — AB
Free Kappa/Lambda Ratio: 82 ratio — ABNORMAL HIGH (ref 0.46–4.00)
Free Lambda Excretion/Day: 1.08 mg/d
Time: 24 hours
Total Protein, Urine: 8 mg/dL

## 2009-08-21 LAB — CBC WITH DIFFERENTIAL/PLATELET
Eosinophils Absolute: 0.1 10*3/uL (ref 0.0–0.5)
HCT: 29.3 % — ABNORMAL LOW (ref 34.8–46.6)
HGB: 9.8 g/dL — ABNORMAL LOW (ref 11.6–15.9)
LYMPH%: 28.2 % (ref 14.0–49.7)
MONO#: 0.5 10*3/uL (ref 0.1–0.9)
NEUT#: 3.5 10*3/uL (ref 1.5–6.5)
NEUT%: 60.3 % (ref 38.4–76.8)
Platelets: 129 10*3/uL — ABNORMAL LOW (ref 145–400)
WBC: 5.9 10*3/uL (ref 3.9–10.3)
lymph#: 1.7 10*3/uL (ref 0.9–3.3)

## 2009-08-23 LAB — FERRITIN: Ferritin: 278 ng/mL (ref 10–291)

## 2009-08-23 LAB — TRANSFERRIN RECEPTOR, SOLUABLE: Transferrin Receptor, Soluble: 60.6 nmol/L

## 2009-08-23 LAB — IRON AND TIBC
%SAT: 27 % (ref 20–55)
Iron: 83 ug/dL (ref 42–145)
UIBC: 226 ug/dL

## 2009-08-25 ENCOUNTER — Ambulatory Visit: Payer: Self-pay | Admitting: Oncology

## 2009-08-27 LAB — CBC WITH DIFFERENTIAL/PLATELET
Basophils Absolute: 0 10*3/uL (ref 0.0–0.1)
EOS%: 1.4 % (ref 0.0–7.0)
Eosinophils Absolute: 0.1 10*3/uL (ref 0.0–0.5)
HGB: 9.9 g/dL — ABNORMAL LOW (ref 11.6–15.9)
LYMPH%: 35.3 % (ref 14.0–49.7)
MCH: 28.5 pg (ref 25.1–34.0)
MCV: 86.2 fL (ref 79.5–101.0)
MONO%: 15 % — ABNORMAL HIGH (ref 0.0–14.0)
NEUT#: 2 10*3/uL (ref 1.5–6.5)
Platelets: 118 10*3/uL — ABNORMAL LOW (ref 145–400)
RBC: 3.47 10*6/uL — ABNORMAL LOW (ref 3.70–5.45)
RDW: 19.9 % — ABNORMAL HIGH (ref 11.2–14.5)

## 2009-08-27 LAB — TECHNOLOGIST REVIEW

## 2009-09-03 LAB — CBC WITH DIFFERENTIAL/PLATELET
BASO%: 1.2 % (ref 0.0–2.0)
Basophils Absolute: 0 10*3/uL (ref 0.0–0.1)
EOS%: 2.7 % (ref 0.0–7.0)
HCT: 28.7 % — ABNORMAL LOW (ref 34.8–46.6)
HGB: 9.3 g/dL — ABNORMAL LOW (ref 11.6–15.9)
LYMPH%: 47.7 % (ref 14.0–49.7)
MCH: 28.6 pg (ref 25.1–34.0)
MCHC: 32.4 g/dL (ref 31.5–36.0)
MCV: 88.3 fL (ref 79.5–101.0)
MONO%: 12.4 % (ref 0.0–14.0)
NEUT%: 36 % — ABNORMAL LOW (ref 38.4–76.8)

## 2009-09-03 LAB — TECHNOLOGIST REVIEW

## 2009-09-06 LAB — COMPREHENSIVE METABOLIC PANEL
ALT: 15 U/L (ref 0–35)
CO2: 22 mEq/L (ref 19–32)
Calcium: 8.4 mg/dL (ref 8.4–10.5)
Chloride: 97 mEq/L (ref 96–112)
Creatinine, Ser: 0.64 mg/dL (ref 0.40–1.20)
Sodium: 131 mEq/L — ABNORMAL LOW (ref 135–145)
Total Protein: 6.5 g/dL (ref 6.0–8.3)

## 2009-09-06 LAB — LACTATE DEHYDROGENASE: LDH: 124 U/L (ref 94–250)

## 2009-09-14 LAB — CBC WITH DIFFERENTIAL/PLATELET
BASO%: 1.1 % (ref 0.0–2.0)
LYMPH%: 41.1 % (ref 14.0–49.7)
MCHC: 33.5 g/dL (ref 31.5–36.0)
MONO#: 0.7 10*3/uL (ref 0.1–0.9)
MONO%: 12.7 % (ref 0.0–14.0)
Platelets: 171 10*3/uL (ref 145–400)
RBC: 3.18 10*6/uL — ABNORMAL LOW (ref 3.70–5.45)
WBC: 5.2 10*3/uL (ref 3.9–10.3)

## 2009-09-22 LAB — CBC WITH DIFFERENTIAL/PLATELET
BASO%: 0.7 % (ref 0.0–2.0)
HCT: 27.5 % — ABNORMAL LOW (ref 34.8–46.6)
MCHC: 33.7 g/dL (ref 31.5–36.0)
MONO#: 0.3 10*3/uL (ref 0.1–0.9)
RBC: 3.05 10*6/uL — ABNORMAL LOW (ref 3.70–5.45)
WBC: 4.5 10*3/uL (ref 3.9–10.3)
lymph#: 1.4 10*3/uL (ref 0.9–3.3)

## 2009-09-23 LAB — VITAMIN D 25 HYDROXY (VIT D DEFICIENCY, FRACTURES): Vit D, 25-Hydroxy: 35 ng/mL (ref 30–89)

## 2009-09-23 LAB — COMPREHENSIVE METABOLIC PANEL
ALT: 10 U/L (ref 0–35)
AST: 13 U/L (ref 0–37)
Albumin: 4 g/dL (ref 3.5–5.2)
Calcium: 8.6 mg/dL (ref 8.4–10.5)
Chloride: 100 mEq/L (ref 96–112)
Potassium: 4.5 mEq/L (ref 3.5–5.3)
Sodium: 136 mEq/L (ref 135–145)

## 2009-09-23 LAB — KAPPA/LAMBDA LIGHT CHAINS: Lambda Free Lght Chn: 0.43 mg/dL — ABNORMAL LOW (ref 0.57–2.63)

## 2009-09-30 ENCOUNTER — Ambulatory Visit: Payer: Self-pay | Admitting: Oncology

## 2009-10-01 LAB — CBC WITH DIFFERENTIAL/PLATELET
Basophils Absolute: 0.1 10*3/uL (ref 0.0–0.1)
Eosinophils Absolute: 0.2 10*3/uL (ref 0.0–0.5)
HGB: 9.5 g/dL — ABNORMAL LOW (ref 11.6–15.9)
MCV: 89.4 fL (ref 79.5–101.0)
MONO#: 0.4 10*3/uL (ref 0.1–0.9)
MONO%: 7.6 % (ref 0.0–14.0)
NEUT#: 3.1 10*3/uL (ref 1.5–6.5)
RBC: 3.29 10*6/uL — ABNORMAL LOW (ref 3.70–5.45)
RDW: 20.3 % — ABNORMAL HIGH (ref 11.2–14.5)
WBC: 5.8 10*3/uL (ref 3.9–10.3)
lymph#: 2 10*3/uL (ref 0.9–3.3)
nRBC: 3 % — ABNORMAL HIGH (ref 0–0)

## 2009-10-22 LAB — CBC WITH DIFFERENTIAL/PLATELET
BASO%: 0.9 % (ref 0.0–2.0)
Basophils Absolute: 0 10*3/uL (ref 0.0–0.1)
EOS%: 2.3 % (ref 0.0–7.0)
Eosinophils Absolute: 0.1 10*3/uL (ref 0.0–0.5)
HCT: 29 % — ABNORMAL LOW (ref 34.8–46.6)
HGB: 9.8 g/dL — ABNORMAL LOW (ref 11.6–15.9)
LYMPH%: 40.7 % (ref 14.0–49.7)
MCH: 30.6 pg (ref 25.1–34.0)
MCHC: 33.7 g/dL (ref 31.5–36.0)
MCV: 90.7 fL (ref 79.5–101.0)
MONO#: 0.3 10*3/uL (ref 0.1–0.9)
MONO%: 6.4 % (ref 0.0–14.0)
NEUT#: 2 10*3/uL (ref 1.5–6.5)
NEUT%: 49.7 % (ref 38.4–76.8)
Platelets: 187 10*3/uL (ref 145–400)
RBC: 3.2 10*6/uL — ABNORMAL LOW (ref 3.70–5.45)
RDW: 21.7 % — ABNORMAL HIGH (ref 11.2–14.5)
WBC: 4 10*3/uL (ref 3.9–10.3)
lymph#: 1.6 10*3/uL (ref 0.9–3.3)

## 2009-10-22 LAB — COMPREHENSIVE METABOLIC PANEL
ALT: 14 U/L (ref 0–35)
AST: 15 U/L (ref 0–37)
Albumin: 4 g/dL (ref 3.5–5.2)
Alkaline Phosphatase: 50 U/L (ref 39–117)
BUN: 10 mg/dL (ref 6–23)
CO2: 23 mEq/L (ref 19–32)
Calcium: 8.7 mg/dL (ref 8.4–10.5)
Chloride: 95 mEq/L — ABNORMAL LOW (ref 96–112)
Creatinine, Ser: 0.71 mg/dL (ref 0.40–1.20)
Glucose, Bld: 122 mg/dL — ABNORMAL HIGH (ref 70–99)
Potassium: 4.6 mEq/L (ref 3.5–5.3)
Sodium: 129 mEq/L — ABNORMAL LOW (ref 135–145)
Total Bilirubin: 0.4 mg/dL (ref 0.3–1.2)
Total Protein: 6.9 g/dL (ref 6.0–8.3)

## 2009-10-22 LAB — IGA: IgA: 1010 mg/dL — ABNORMAL HIGH (ref 68–378)

## 2009-10-22 LAB — LACTATE DEHYDROGENASE: LDH: 115 U/L (ref 94–250)

## 2009-10-23 LAB — KAPPA/LAMBDA LIGHT CHAINS
Kappa:Lambda Ratio: 7.07 — ABNORMAL HIGH (ref 0.26–1.65)
Lambda Free Lght Chn: 1 mg/dL (ref 0.57–2.63)

## 2009-11-03 ENCOUNTER — Ambulatory Visit: Payer: Self-pay | Admitting: Oncology

## 2009-11-05 LAB — CBC WITH DIFFERENTIAL/PLATELET
Eosinophils Absolute: 0.1 10*3/uL (ref 0.0–0.5)
LYMPH%: 49.2 % (ref 14.0–49.7)
MCH: 30.2 pg (ref 25.1–34.0)
MCHC: 33.3 g/dL (ref 31.5–36.0)
MCV: 90.5 fL (ref 79.5–101.0)
MONO%: 17.2 % — ABNORMAL HIGH (ref 0.0–14.0)
NEUT#: 1.4 10*3/uL — ABNORMAL LOW (ref 1.5–6.5)
Platelets: 122 10*3/uL — ABNORMAL LOW (ref 145–400)
RBC: 3.2 10*6/uL — ABNORMAL LOW (ref 3.70–5.45)

## 2009-11-19 LAB — CBC WITH DIFFERENTIAL/PLATELET
Basophils Absolute: 0 10*3/uL (ref 0.0–0.1)
Eosinophils Absolute: 0.1 10*3/uL (ref 0.0–0.5)
HCT: 32.6 % — ABNORMAL LOW (ref 34.8–46.6)
HGB: 10.9 g/dL — ABNORMAL LOW (ref 11.6–15.9)
MCH: 30.4 pg (ref 25.1–34.0)
MCV: 90.9 fL (ref 79.5–101.0)
MONO%: 11.7 % (ref 0.0–14.0)
NEUT#: 2.4 10*3/uL (ref 1.5–6.5)
NEUT%: 51.3 % (ref 38.4–76.8)
RDW: 21.2 % — ABNORMAL HIGH (ref 11.2–14.5)

## 2009-11-19 LAB — COMPREHENSIVE METABOLIC PANEL
Albumin: 4.3 g/dL (ref 3.5–5.2)
BUN: 12 mg/dL (ref 6–23)
Calcium: 9 mg/dL (ref 8.4–10.5)
Chloride: 99 mEq/L (ref 96–112)
Creatinine, Ser: 0.75 mg/dL (ref 0.40–1.20)
Glucose, Bld: 169 mg/dL — ABNORMAL HIGH (ref 70–99)
Potassium: 4.4 mEq/L (ref 3.5–5.3)

## 2009-11-19 LAB — IGA: IgA: 885 mg/dL — ABNORMAL HIGH (ref 68–378)

## 2009-12-03 ENCOUNTER — Ambulatory Visit: Payer: Self-pay | Admitting: Oncology

## 2009-12-03 LAB — CBC WITH DIFFERENTIAL/PLATELET
Basophils Absolute: 0 10*3/uL (ref 0.0–0.1)
EOS%: 3.5 % (ref 0.0–7.0)
Eosinophils Absolute: 0.2 10*3/uL (ref 0.0–0.5)
HGB: 10.3 g/dL — ABNORMAL LOW (ref 11.6–15.9)
LYMPH%: 43.6 % (ref 14.0–49.7)
MCH: 29.4 pg (ref 25.1–34.0)
MCV: 91.4 fL (ref 79.5–101.0)
MONO%: 7.3 % (ref 0.0–14.0)
NEUT%: 44.9 % (ref 38.4–76.8)
Platelets: 167 10*3/uL (ref 145–400)
RDW: 20.3 % — ABNORMAL HIGH (ref 11.2–14.5)

## 2009-12-22 LAB — CBC WITH DIFFERENTIAL/PLATELET
BASO%: 0.8 % (ref 0.0–2.0)
EOS%: 4.4 % (ref 0.0–7.0)
LYMPH%: 45.3 % (ref 14.0–49.7)
MCH: 31 pg (ref 25.1–34.0)
MCHC: 33.9 g/dL (ref 31.5–36.0)
MCV: 91.4 fL (ref 79.5–101.0)
MONO%: 8 % (ref 0.0–14.0)
NEUT#: 1.4 10*3/uL — ABNORMAL LOW (ref 1.5–6.5)
Platelets: 160 10*3/uL (ref 145–400)
RBC: 3.39 10*6/uL — ABNORMAL LOW (ref 3.70–5.45)
RDW: 20.2 % — ABNORMAL HIGH (ref 11.2–14.5)

## 2009-12-23 LAB — COMPREHENSIVE METABOLIC PANEL
ALT: 17 U/L (ref 0–35)
AST: 19 U/L (ref 0–37)
Albumin: 4.4 g/dL (ref 3.5–5.2)
Alkaline Phosphatase: 40 U/L (ref 39–117)
Glucose, Bld: 123 mg/dL — ABNORMAL HIGH (ref 70–99)
Potassium: 3.9 mEq/L (ref 3.5–5.3)
Sodium: 134 mEq/L — ABNORMAL LOW (ref 135–145)
Total Bilirubin: 0.4 mg/dL (ref 0.3–1.2)
Total Protein: 6.9 g/dL (ref 6.0–8.3)

## 2009-12-23 LAB — IGA: IgA: 872 mg/dL — ABNORMAL HIGH (ref 68–378)

## 2010-01-12 ENCOUNTER — Ambulatory Visit: Payer: Self-pay | Admitting: Oncology

## 2010-01-14 LAB — CBC WITH DIFFERENTIAL/PLATELET
BASO%: 0.8 % (ref 0.0–2.0)
EOS%: 3.5 % (ref 0.0–7.0)
HCT: 33.1 % — ABNORMAL LOW (ref 34.8–46.6)
MCH: 29 pg (ref 25.1–34.0)
MCHC: 31.4 g/dL — ABNORMAL LOW (ref 31.5–36.0)
MONO%: 8 % (ref 0.0–14.0)
NEUT%: 50.9 % (ref 38.4–76.8)
lymph#: 1.5 10*3/uL (ref 0.9–3.3)

## 2010-02-09 ENCOUNTER — Ambulatory Visit (HOSPITAL_COMMUNITY): Admission: RE | Admit: 2010-02-09 | Discharge: 2010-02-09 | Payer: Self-pay | Admitting: Oncology

## 2010-02-09 LAB — COMPREHENSIVE METABOLIC PANEL
ALT: 17 U/L (ref 0–35)
AST: 19 U/L (ref 0–37)
Albumin: 3.7 g/dL (ref 3.5–5.2)
CO2: 25 mEq/L (ref 19–32)
Calcium: 8.5 mg/dL (ref 8.4–10.5)
Chloride: 92 mEq/L — ABNORMAL LOW (ref 96–112)
Creatinine, Ser: 0.67 mg/dL (ref 0.40–1.20)
Potassium: 4.7 mEq/L (ref 3.5–5.3)

## 2010-02-09 LAB — CBC WITH DIFFERENTIAL/PLATELET
BASO%: 0.5 % (ref 0.0–2.0)
Eosinophils Absolute: 0.1 10*3/uL (ref 0.0–0.5)
HCT: 27.9 % — ABNORMAL LOW (ref 34.8–46.6)
LYMPH%: 31.5 % (ref 14.0–49.7)
MONO#: 0.5 10*3/uL (ref 0.1–0.9)
NEUT#: 3.1 10*3/uL (ref 1.5–6.5)
NEUT%: 56 % (ref 38.4–76.8)
Platelets: 183 10*3/uL (ref 145–400)
WBC: 5.4 10*3/uL (ref 3.9–10.3)
lymph#: 1.7 10*3/uL (ref 0.9–3.3)

## 2010-02-09 LAB — IGA: IgA: 655 mg/dL — ABNORMAL HIGH (ref 68–378)

## 2010-02-09 LAB — LACTATE DEHYDROGENASE: LDH: 99 U/L (ref 94–250)

## 2010-03-09 ENCOUNTER — Ambulatory Visit: Payer: Self-pay | Admitting: Oncology

## 2010-03-11 LAB — CBC WITH DIFFERENTIAL/PLATELET
Basophils Absolute: 0 10*3/uL (ref 0.0–0.1)
HCT: 30.1 % — ABNORMAL LOW (ref 34.8–46.6)
HGB: 10.2 g/dL — ABNORMAL LOW (ref 11.6–15.9)
MONO#: 0.6 10*3/uL (ref 0.1–0.9)
NEUT%: 31.7 % — ABNORMAL LOW (ref 38.4–76.8)
WBC: 4 10*3/uL (ref 3.9–10.3)
lymph#: 2.1 10*3/uL (ref 0.9–3.3)

## 2010-03-11 LAB — COMPREHENSIVE METABOLIC PANEL
ALT: 19 U/L (ref 0–35)
BUN: 10 mg/dL (ref 6–23)
CO2: 24 mEq/L (ref 19–32)
Calcium: 8.8 mg/dL (ref 8.4–10.5)
Chloride: 102 mEq/L (ref 96–112)
Creatinine, Ser: 0.67 mg/dL (ref 0.40–1.20)

## 2010-03-11 LAB — LACTATE DEHYDROGENASE: LDH: 105 U/L (ref 94–250)

## 2010-03-12 LAB — IGA: IgA: 680 mg/dL — ABNORMAL HIGH (ref 68–378)

## 2010-03-12 LAB — VITAMIN D 25 HYDROXY (VIT D DEFICIENCY, FRACTURES): Vit D, 25-Hydroxy: 34 ng/mL (ref 30–89)

## 2010-03-18 LAB — CBC WITH DIFFERENTIAL/PLATELET
BASO%: 1.1 % (ref 0.0–2.0)
Eosinophils Absolute: 0.1 10*3/uL (ref 0.0–0.5)
HCT: 32.4 % — ABNORMAL LOW (ref 34.8–46.6)
LYMPH%: 37.2 % (ref 14.0–49.7)
MCHC: 34 g/dL (ref 31.5–36.0)
MONO#: 0.8 10*3/uL (ref 0.1–0.9)
NEUT#: 2.8 10*3/uL (ref 1.5–6.5)
NEUT%: 46.8 % (ref 38.4–76.8)
Platelets: 221 10*3/uL (ref 145–400)
WBC: 5.9 10*3/uL (ref 3.9–10.3)
lymph#: 2.2 10*3/uL (ref 0.9–3.3)

## 2010-04-09 ENCOUNTER — Ambulatory Visit: Payer: Self-pay | Admitting: Oncology

## 2010-04-13 LAB — CBC WITH DIFFERENTIAL/PLATELET
Basophils Absolute: 0 10*3/uL (ref 0.0–0.1)
HCT: 32.8 % — ABNORMAL LOW (ref 34.8–46.6)
HGB: 10.7 g/dL — ABNORMAL LOW (ref 11.6–15.9)
LYMPH%: 34.4 % (ref 14.0–49.7)
MCH: 30.5 pg (ref 25.1–34.0)
MCHC: 32.6 g/dL (ref 31.5–36.0)
MONO#: 0.8 10*3/uL (ref 0.1–0.9)
NEUT%: 46.7 % (ref 38.4–76.8)
Platelets: 186 10*3/uL (ref 145–400)
WBC: 5.1 10*3/uL (ref 3.9–10.3)
lymph#: 1.8 10*3/uL (ref 0.9–3.3)

## 2010-04-13 LAB — COMPREHENSIVE METABOLIC PANEL
ALT: 20 U/L (ref 0–35)
Albumin: 3.5 g/dL (ref 3.5–5.2)
CO2: 25 mEq/L (ref 19–32)
Calcium: 8.5 mg/dL (ref 8.4–10.5)
Chloride: 102 mEq/L (ref 96–112)
Glucose, Bld: 135 mg/dL — ABNORMAL HIGH (ref 70–99)
Sodium: 135 mEq/L (ref 135–145)
Total Protein: 6.8 g/dL (ref 6.0–8.3)

## 2010-04-13 LAB — LACTATE DEHYDROGENASE: LDH: 317 U/L — ABNORMAL HIGH (ref 94–250)

## 2010-04-14 LAB — VITAMIN D 25 HYDROXY (VIT D DEFICIENCY, FRACTURES): Vit D, 25-Hydroxy: 32 ng/mL (ref 30–89)

## 2010-04-29 LAB — CBC WITH DIFFERENTIAL/PLATELET
Basophils Absolute: 0.1 10*3/uL (ref 0.0–0.1)
Eosinophils Absolute: 0.1 10*3/uL (ref 0.0–0.5)
HGB: 9.8 g/dL — ABNORMAL LOW (ref 11.6–15.9)
MCV: 90.6 fL (ref 79.5–101.0)
MONO#: 1.1 10*3/uL — ABNORMAL HIGH (ref 0.1–0.9)
NEUT#: 1.8 10*3/uL (ref 1.5–6.5)
RBC: 3.31 10*6/uL — ABNORMAL LOW (ref 3.70–5.45)
RDW: 16.9 % — ABNORMAL HIGH (ref 11.2–14.5)
WBC: 5.4 10*3/uL (ref 3.9–10.3)
lymph#: 2.3 10*3/uL (ref 0.9–3.3)
nRBC: 0 % (ref 0–0)

## 2010-05-12 ENCOUNTER — Ambulatory Visit: Payer: Self-pay | Admitting: Oncology

## 2010-05-14 LAB — CBC WITH DIFFERENTIAL/PLATELET
BASO%: 1.9 % (ref 0.0–2.0)
EOS%: 3.8 % (ref 0.0–7.0)
LYMPH%: 38.1 % (ref 14.0–49.7)
MCH: 29.6 pg (ref 25.1–34.0)
MCHC: 32.4 g/dL (ref 31.5–36.0)
MONO#: 0.7 10*3/uL (ref 0.1–0.9)
Platelets: 176 10*3/uL (ref 145–400)
RBC: 3.61 10*6/uL — ABNORMAL LOW (ref 3.70–5.45)
WBC: 4.2 10*3/uL (ref 3.9–10.3)

## 2010-05-14 LAB — BASIC METABOLIC PANEL
CO2: 21 mEq/L (ref 19–32)
Calcium: 9.1 mg/dL (ref 8.4–10.5)
Creatinine, Ser: 0.81 mg/dL (ref 0.40–1.20)
Sodium: 137 mEq/L (ref 135–145)

## 2010-05-14 LAB — SEDIMENTATION RATE: Sed Rate: 42 mm/hr — ABNORMAL HIGH (ref 0–22)

## 2010-05-20 LAB — CBC WITH DIFFERENTIAL/PLATELET
BASO%: 0.5 % (ref 0.0–2.0)
Basophils Absolute: 0 10*3/uL (ref 0.0–0.1)
EOS%: 2.9 % (ref 0.0–7.0)
Eosinophils Absolute: 0.1 10*3/uL (ref 0.0–0.5)
HCT: 31.9 % — ABNORMAL LOW (ref 34.8–46.6)
HGB: 10.2 g/dL — ABNORMAL LOW (ref 11.6–15.9)
LYMPH%: 37.9 % (ref 14.0–49.7)
MCH: 29.3 pg (ref 25.1–34.0)
MCHC: 32 g/dL (ref 31.5–36.0)
MCV: 91.7 fL (ref 79.5–101.0)
MONO#: 0.8 10*3/uL (ref 0.1–0.9)
MONO%: 19.9 % — ABNORMAL HIGH (ref 0.0–14.0)
NEUT#: 1.6 10*3/uL (ref 1.5–6.5)
NEUT%: 38.8 % (ref 38.4–76.8)
Platelets: 99 10*3/uL — ABNORMAL LOW (ref 145–400)
RBC: 3.48 10*6/uL — ABNORMAL LOW (ref 3.70–5.45)
RDW: 18 % — ABNORMAL HIGH (ref 11.2–14.5)
WBC: 4.2 10*3/uL (ref 3.9–10.3)
lymph#: 1.6 10*3/uL (ref 0.9–3.3)

## 2010-05-21 LAB — COMPREHENSIVE METABOLIC PANEL
ALT: 17 U/L (ref 0–35)
AST: 15 U/L (ref 0–37)
Albumin: 4.1 g/dL (ref 3.5–5.2)
Alkaline Phosphatase: 60 U/L (ref 39–117)
BUN: 13 mg/dL (ref 6–23)
CO2: 23 mEq/L (ref 19–32)
Calcium: 8.4 mg/dL (ref 8.4–10.5)
Chloride: 103 mEq/L (ref 96–112)
Creatinine, Ser: 0.81 mg/dL (ref 0.40–1.20)
Glucose, Bld: 186 mg/dL — ABNORMAL HIGH (ref 70–99)
Potassium: 4.6 mEq/L (ref 3.5–5.3)
Sodium: 137 mEq/L (ref 135–145)
Total Bilirubin: 0.4 mg/dL (ref 0.3–1.2)
Total Protein: 6.9 g/dL (ref 6.0–8.3)

## 2010-05-21 LAB — LACTATE DEHYDROGENASE: LDH: 128 U/L (ref 94–250)

## 2010-05-21 LAB — VITAMIN D 25 HYDROXY (VIT D DEFICIENCY, FRACTURES): Vit D, 25-Hydroxy: 36 ng/mL (ref 30–89)

## 2010-05-21 LAB — IGA: IgA: 574 mg/dL — ABNORMAL HIGH (ref 68–378)

## 2010-05-21 LAB — VITAMIN B12: Vitamin B-12: 869 pg/mL (ref 211–911)

## 2010-06-06 ENCOUNTER — Encounter (HOSPITAL_COMMUNITY): Payer: Self-pay | Admitting: Oncology

## 2010-06-08 LAB — CBC WITH DIFFERENTIAL/PLATELET
BASO%: 0.4 % (ref 0.0–2.0)
EOS%: 2.6 % (ref 0.0–7.0)
MCH: 29.3 pg (ref 25.1–34.0)
MCHC: 32.7 g/dL (ref 31.5–36.0)
NEUT%: 40.3 % (ref 38.4–76.8)
RBC: 3.34 10*6/uL — ABNORMAL LOW (ref 3.70–5.45)
RDW: 17.5 % — ABNORMAL HIGH (ref 11.2–14.5)
WBC: 6.9 10*3/uL (ref 3.9–10.3)
lymph#: 2.9 10*3/uL (ref 0.9–3.3)

## 2010-06-22 ENCOUNTER — Encounter (HOSPITAL_BASED_OUTPATIENT_CLINIC_OR_DEPARTMENT_OTHER): Payer: Medicare Other | Admitting: Oncology

## 2010-06-22 ENCOUNTER — Other Ambulatory Visit (HOSPITAL_COMMUNITY): Payer: Self-pay | Admitting: Oncology

## 2010-06-22 DIAGNOSIS — R197 Diarrhea, unspecified: Secondary | ICD-10-CM

## 2010-06-22 DIAGNOSIS — D509 Iron deficiency anemia, unspecified: Secondary | ICD-10-CM

## 2010-06-22 DIAGNOSIS — E538 Deficiency of other specified B group vitamins: Secondary | ICD-10-CM

## 2010-06-22 DIAGNOSIS — C9000 Multiple myeloma not having achieved remission: Secondary | ICD-10-CM

## 2010-06-22 LAB — CBC WITH DIFFERENTIAL/PLATELET
BASO%: 0.7 % (ref 0.0–2.0)
EOS%: 3.5 % (ref 0.0–7.0)
Eosinophils Absolute: 0.3 10*3/uL (ref 0.0–0.5)
MCH: 29.6 pg (ref 25.1–34.0)
MCHC: 32.3 g/dL (ref 31.5–36.0)
MCV: 91.7 fL (ref 79.5–101.0)
MONO%: 4.7 % (ref 0.0–14.0)
NEUT#: 4.6 10*3/uL (ref 1.5–6.5)
RBC: 3.51 10*6/uL — ABNORMAL LOW (ref 3.70–5.45)
RDW: 18.6 % — ABNORMAL HIGH (ref 11.2–14.5)

## 2010-06-30 ENCOUNTER — Other Ambulatory Visit (HOSPITAL_COMMUNITY): Payer: Self-pay | Admitting: Oncology

## 2010-06-30 ENCOUNTER — Ambulatory Visit (HOSPITAL_COMMUNITY)
Admission: RE | Admit: 2010-06-30 | Discharge: 2010-06-30 | Disposition: A | Discharge status: Home / Self Care | Payer: Medicare Other | Source: Ambulatory Visit | Attending: Oncology | Admitting: Oncology

## 2010-06-30 DIAGNOSIS — C9000 Multiple myeloma not having achieved remission: Secondary | ICD-10-CM

## 2010-06-30 DIAGNOSIS — M129 Arthropathy, unspecified: Secondary | ICD-10-CM | POA: Insufficient documentation

## 2010-06-30 DIAGNOSIS — M19019 Primary osteoarthritis, unspecified shoulder: Secondary | ICD-10-CM | POA: Insufficient documentation

## 2010-06-30 DIAGNOSIS — M5030 Other cervical disc degeneration, unspecified cervical region: Secondary | ICD-10-CM | POA: Insufficient documentation

## 2010-06-30 DIAGNOSIS — M47812 Spondylosis without myelopathy or radiculopathy, cervical region: Secondary | ICD-10-CM | POA: Insufficient documentation

## 2010-06-30 DIAGNOSIS — Q762 Congenital spondylolisthesis: Secondary | ICD-10-CM | POA: Insufficient documentation

## 2010-07-06 ENCOUNTER — Encounter (HOSPITAL_BASED_OUTPATIENT_CLINIC_OR_DEPARTMENT_OTHER): Payer: Medicare Other | Admitting: Oncology

## 2010-07-06 ENCOUNTER — Other Ambulatory Visit (HOSPITAL_COMMUNITY): Payer: Self-pay | Admitting: Oncology

## 2010-07-06 DIAGNOSIS — R197 Diarrhea, unspecified: Secondary | ICD-10-CM

## 2010-07-06 DIAGNOSIS — D630 Anemia in neoplastic disease: Secondary | ICD-10-CM

## 2010-07-06 DIAGNOSIS — D509 Iron deficiency anemia, unspecified: Secondary | ICD-10-CM

## 2010-07-06 DIAGNOSIS — C9000 Multiple myeloma not having achieved remission: Secondary | ICD-10-CM

## 2010-07-06 DIAGNOSIS — E538 Deficiency of other specified B group vitamins: Secondary | ICD-10-CM

## 2010-07-06 LAB — CBC WITH DIFFERENTIAL/PLATELET
BASO%: 0.8 % (ref 0.0–2.0)
Eosinophils Absolute: 0.1 10*3/uL (ref 0.0–0.5)
MCHC: 31.7 g/dL (ref 31.5–36.0)
MONO#: 0.8 10*3/uL (ref 0.1–0.9)
NEUT#: 1.2 10*3/uL — ABNORMAL LOW (ref 1.5–6.5)
RBC: 3.05 10*6/uL — ABNORMAL LOW (ref 3.70–5.45)
RDW: 17.9 % — ABNORMAL HIGH (ref 11.2–14.5)
WBC: 4 10*3/uL (ref 3.9–10.3)
lymph#: 1.9 10*3/uL (ref 0.9–3.3)

## 2010-07-06 LAB — COMPREHENSIVE METABOLIC PANEL
Albumin: 3.3 g/dL — ABNORMAL LOW (ref 3.5–5.2)
Alkaline Phosphatase: 52 U/L (ref 39–117)
BUN: 12 mg/dL (ref 6–23)
Calcium: 8.5 mg/dL (ref 8.4–10.5)
Chloride: 99 mEq/L (ref 96–112)
Glucose, Bld: 134 mg/dL — ABNORMAL HIGH (ref 70–99)
Potassium: 4 mEq/L (ref 3.5–5.3)
Sodium: 134 mEq/L — ABNORMAL LOW (ref 135–145)
Total Protein: 6.3 g/dL (ref 6.0–8.3)

## 2010-07-06 LAB — VITAMIN D 25 HYDROXY (VIT D DEFICIENCY, FRACTURES): Vit D, 25-Hydroxy: 36 ng/mL (ref 30–89)

## 2010-07-13 ENCOUNTER — Other Ambulatory Visit (HOSPITAL_COMMUNITY): Payer: Self-pay | Admitting: Oncology

## 2010-07-13 ENCOUNTER — Encounter (HOSPITAL_BASED_OUTPATIENT_CLINIC_OR_DEPARTMENT_OTHER): Payer: Medicare Other | Admitting: Oncology

## 2010-07-13 DIAGNOSIS — E538 Deficiency of other specified B group vitamins: Secondary | ICD-10-CM

## 2010-07-13 DIAGNOSIS — D630 Anemia in neoplastic disease: Secondary | ICD-10-CM

## 2010-07-13 LAB — CBC WITH DIFFERENTIAL/PLATELET
Basophils Absolute: 0.1 10*3/uL (ref 0.0–0.1)
Eosinophils Absolute: 0.1 10*3/uL (ref 0.0–0.5)
HGB: 9 g/dL — ABNORMAL LOW (ref 11.6–15.9)
LYMPH%: 35.4 % (ref 14.0–49.7)
MCV: 92.5 fL (ref 79.5–101.0)
MONO%: 6.8 % (ref 0.0–14.0)
NEUT#: 3.7 10*3/uL (ref 1.5–6.5)
NEUT%: 54.1 % (ref 38.4–76.8)
Platelets: 259 10*3/uL (ref 145–400)

## 2010-07-16 ENCOUNTER — Encounter (HOSPITAL_COMMUNITY): Payer: Self-pay | Admitting: Radiology

## 2010-07-16 ENCOUNTER — Emergency Department (HOSPITAL_COMMUNITY): Payer: Medicare Other

## 2010-07-16 ENCOUNTER — Emergency Department (HOSPITAL_COMMUNITY)
Admission: EM | Admit: 2010-07-16 | Discharge: 2010-07-16 | Disposition: A | Discharge status: Home / Self Care | Payer: Medicare Other | Attending: Emergency Medicine | Admitting: Emergency Medicine

## 2010-07-16 DIAGNOSIS — S0003XA Contusion of scalp, initial encounter: Secondary | ICD-10-CM | POA: Insufficient documentation

## 2010-07-16 DIAGNOSIS — Z87898 Personal history of other specified conditions: Secondary | ICD-10-CM | POA: Insufficient documentation

## 2010-07-16 DIAGNOSIS — S7000XA Contusion of unspecified hip, initial encounter: Secondary | ICD-10-CM | POA: Insufficient documentation

## 2010-07-16 DIAGNOSIS — E119 Type 2 diabetes mellitus without complications: Secondary | ICD-10-CM | POA: Insufficient documentation

## 2010-07-16 DIAGNOSIS — Y92009 Unspecified place in unspecified non-institutional (private) residence as the place of occurrence of the external cause: Secondary | ICD-10-CM | POA: Insufficient documentation

## 2010-07-16 DIAGNOSIS — W010XXA Fall on same level from slipping, tripping and stumbling without subsequent striking against object, initial encounter: Secondary | ICD-10-CM | POA: Insufficient documentation

## 2010-07-16 DIAGNOSIS — I10 Essential (primary) hypertension: Secondary | ICD-10-CM | POA: Insufficient documentation

## 2010-07-16 DIAGNOSIS — I25810 Atherosclerosis of coronary artery bypass graft(s) without angina pectoris: Secondary | ICD-10-CM | POA: Insufficient documentation

## 2010-07-16 HISTORY — DX: Malignant (primary) neoplasm, unspecified: C80.1

## 2010-07-16 HISTORY — DX: Essential (primary) hypertension: I10

## 2010-08-08 LAB — DIFFERENTIAL
Basophils Absolute: 0 10*3/uL (ref 0.0–0.1)
Eosinophils Relative: 1 % (ref 0–5)
Lymphocytes Relative: 27 % (ref 12–46)
Monocytes Relative: 8 % (ref 3–12)
Neutrophils Relative %: 64 % (ref 43–77)

## 2010-08-08 LAB — CBC
HCT: 27.3 % — ABNORMAL LOW (ref 36.0–46.0)
Hemoglobin: 8.8 g/dL — ABNORMAL LOW (ref 12.0–15.0)
MCHC: 32.4 g/dL (ref 30.0–36.0)
MCV: 90.6 fL (ref 78.0–100.0)
RBC: 3.01 MIL/uL — ABNORMAL LOW (ref 3.87–5.11)

## 2010-08-08 LAB — BONE MARROW EXAM

## 2010-08-10 ENCOUNTER — Other Ambulatory Visit (HOSPITAL_COMMUNITY): Payer: Self-pay | Admitting: Oncology

## 2010-08-10 ENCOUNTER — Encounter (HOSPITAL_BASED_OUTPATIENT_CLINIC_OR_DEPARTMENT_OTHER): Payer: Medicare Other | Admitting: Oncology

## 2010-08-10 DIAGNOSIS — D630 Anemia in neoplastic disease: Secondary | ICD-10-CM

## 2010-08-10 DIAGNOSIS — D649 Anemia, unspecified: Secondary | ICD-10-CM

## 2010-08-10 DIAGNOSIS — E538 Deficiency of other specified B group vitamins: Secondary | ICD-10-CM

## 2010-08-10 DIAGNOSIS — C9000 Multiple myeloma not having achieved remission: Secondary | ICD-10-CM

## 2010-08-10 DIAGNOSIS — R634 Abnormal weight loss: Secondary | ICD-10-CM

## 2010-08-10 LAB — CBC WITH DIFFERENTIAL/PLATELET
BASO%: 1.1 % (ref 0.0–2.0)
Basophils Absolute: 0.1 10*3/uL (ref 0.0–0.1)
EOS%: 1.8 % (ref 0.0–7.0)
HCT: 30.2 % — ABNORMAL LOW (ref 34.8–46.6)
HGB: 9.8 g/dL — ABNORMAL LOW (ref 11.6–15.9)
MCH: 29.2 pg (ref 25.1–34.0)
MONO#: 0.4 10*3/uL (ref 0.1–0.9)
NEUT#: 3.1 10*3/uL (ref 1.5–6.5)
NEUT%: 56.3 % (ref 38.4–76.8)
RDW: 18.4 % — ABNORMAL HIGH (ref 11.2–14.5)
WBC: 5.5 10*3/uL (ref 3.9–10.3)
lymph#: 1.9 10*3/uL (ref 0.9–3.3)

## 2010-08-11 LAB — VITAMIN D 25 HYDROXY (VIT D DEFICIENCY, FRACTURES): Vit D, 25-Hydroxy: 40 ng/mL (ref 30–89)

## 2010-08-11 LAB — COMPREHENSIVE METABOLIC PANEL
AST: 15 U/L (ref 0–37)
Albumin: 4 g/dL (ref 3.5–5.2)
Alkaline Phosphatase: 78 U/L (ref 39–117)
BUN: 22 mg/dL (ref 6–23)
Calcium: 8.7 mg/dL (ref 8.4–10.5)
Chloride: 101 mEq/L (ref 96–112)
Glucose, Bld: 108 mg/dL — ABNORMAL HIGH (ref 70–99)
Potassium: 5.3 mEq/L (ref 3.5–5.3)
Sodium: 133 mEq/L — ABNORMAL LOW (ref 135–145)
Total Protein: 7.1 g/dL (ref 6.0–8.3)

## 2010-08-11 LAB — IGA: IgA: 685 mg/dL — ABNORMAL HIGH (ref 68–378)

## 2010-08-17 ENCOUNTER — Other Ambulatory Visit (HOSPITAL_COMMUNITY): Payer: Self-pay | Admitting: Oncology

## 2010-08-17 ENCOUNTER — Ambulatory Visit (HOSPITAL_COMMUNITY)
Admission: RE | Admit: 2010-08-17 | Discharge: 2010-08-17 | Disposition: A | Discharge status: Home / Self Care | Payer: Medicare Other | Source: Ambulatory Visit | Attending: Oncology | Admitting: Oncology

## 2010-08-17 DIAGNOSIS — C9000 Multiple myeloma not having achieved remission: Secondary | ICD-10-CM

## 2010-08-18 ENCOUNTER — Ambulatory Visit (HOSPITAL_COMMUNITY)
Admission: RE | Admit: 2010-08-18 | Discharge: 2010-08-18 | Disposition: A | Discharge status: Home / Self Care | Payer: Medicare Other | Source: Ambulatory Visit | Attending: Oncology | Admitting: Oncology

## 2010-08-18 DIAGNOSIS — R29898 Other symptoms and signs involving the musculoskeletal system: Secondary | ICD-10-CM | POA: Insufficient documentation

## 2010-08-18 DIAGNOSIS — C9000 Multiple myeloma not having achieved remission: Secondary | ICD-10-CM | POA: Insufficient documentation

## 2010-08-20 ENCOUNTER — Other Ambulatory Visit (HOSPITAL_COMMUNITY): Payer: Medicare Other

## 2010-09-07 ENCOUNTER — Encounter (HOSPITAL_BASED_OUTPATIENT_CLINIC_OR_DEPARTMENT_OTHER): Payer: Medicare Other | Admitting: Oncology

## 2010-09-07 ENCOUNTER — Other Ambulatory Visit (HOSPITAL_COMMUNITY): Payer: Self-pay | Admitting: Oncology

## 2010-09-07 DIAGNOSIS — E538 Deficiency of other specified B group vitamins: Secondary | ICD-10-CM

## 2010-09-07 DIAGNOSIS — C9000 Multiple myeloma not having achieved remission: Secondary | ICD-10-CM

## 2010-09-07 LAB — CBC WITH DIFFERENTIAL/PLATELET
BASO%: 0.5 % (ref 0.0–2.0)
Basophils Absolute: 0 10*3/uL (ref 0.0–0.1)
EOS%: 3.1 % (ref 0.0–7.0)
HCT: 32.1 % — ABNORMAL LOW (ref 34.8–46.6)
HGB: 10.7 g/dL — ABNORMAL LOW (ref 11.6–15.9)
LYMPH%: 35 % (ref 14.0–49.7)
MCH: 31.1 pg (ref 25.1–34.0)
MCHC: 33.3 g/dL (ref 31.5–36.0)
MCV: 93.5 fL (ref 79.5–101.0)
MONO%: 11.3 % (ref 0.0–14.0)
NEUT%: 50.1 % (ref 38.4–76.8)

## 2010-10-05 ENCOUNTER — Other Ambulatory Visit (HOSPITAL_COMMUNITY): Payer: Self-pay | Admitting: Oncology

## 2010-10-05 ENCOUNTER — Encounter (HOSPITAL_BASED_OUTPATIENT_CLINIC_OR_DEPARTMENT_OTHER): Payer: Medicare Other | Admitting: Oncology

## 2010-10-05 DIAGNOSIS — D649 Anemia, unspecified: Secondary | ICD-10-CM

## 2010-10-05 DIAGNOSIS — C9000 Multiple myeloma not having achieved remission: Secondary | ICD-10-CM

## 2010-10-05 LAB — CBC WITH DIFFERENTIAL/PLATELET
BASO%: 1 % (ref 0.0–2.0)
Basophils Absolute: 0.1 10*3/uL (ref 0.0–0.1)
EOS%: 2.2 % (ref 0.0–7.0)
MCH: 29.5 pg (ref 25.1–34.0)
MCHC: 31.3 g/dL — ABNORMAL LOW (ref 31.5–36.0)
MCV: 94 fL (ref 79.5–101.0)
MONO%: 17.4 % — ABNORMAL HIGH (ref 0.0–14.0)
RBC: 3.02 10*6/uL — ABNORMAL LOW (ref 3.70–5.45)
RDW: 18.3 % — ABNORMAL HIGH (ref 11.2–14.5)
lymph#: 2.4 10*3/uL (ref 0.9–3.3)
nRBC: 0 % (ref 0–0)

## 2010-10-05 LAB — COMPREHENSIVE METABOLIC PANEL
ALT: 15 U/L (ref 0–35)
Alkaline Phosphatase: 48 U/L (ref 39–117)
CO2: 22 mEq/L (ref 19–32)
Creatinine, Ser: 1.11 mg/dL (ref 0.40–1.20)
Total Bilirubin: 0.3 mg/dL (ref 0.3–1.2)

## 2010-10-05 LAB — VITAMIN D 25 HYDROXY (VIT D DEFICIENCY, FRACTURES): Vit D, 25-Hydroxy: 39 ng/mL (ref 30–89)

## 2010-10-05 LAB — LACTATE DEHYDROGENASE: LDH: 154 U/L (ref 94–250)

## 2010-10-05 LAB — MAGNESIUM: Magnesium: 1 mg/dL — ABNORMAL LOW (ref 1.5–2.5)

## 2010-10-14 ENCOUNTER — Other Ambulatory Visit (HOSPITAL_COMMUNITY): Payer: Self-pay | Admitting: Oncology

## 2010-10-14 ENCOUNTER — Encounter (HOSPITAL_BASED_OUTPATIENT_CLINIC_OR_DEPARTMENT_OTHER): Payer: Medicare Other | Admitting: Oncology

## 2010-10-14 DIAGNOSIS — D649 Anemia, unspecified: Secondary | ICD-10-CM

## 2010-10-14 DIAGNOSIS — R634 Abnormal weight loss: Secondary | ICD-10-CM

## 2010-10-14 DIAGNOSIS — E538 Deficiency of other specified B group vitamins: Secondary | ICD-10-CM

## 2010-10-14 DIAGNOSIS — D509 Iron deficiency anemia, unspecified: Secondary | ICD-10-CM

## 2010-10-14 DIAGNOSIS — C9000 Multiple myeloma not having achieved remission: Secondary | ICD-10-CM

## 2010-10-14 LAB — COMPREHENSIVE METABOLIC PANEL
ALT: 18 U/L (ref 0–35)
AST: 15 U/L (ref 0–37)
Albumin: 3.9 g/dL (ref 3.5–5.2)
Alkaline Phosphatase: 56 U/L (ref 39–117)
BUN: 12 mg/dL (ref 6–23)
Creatinine, Ser: 0.97 mg/dL (ref 0.40–1.20)
Potassium: 4.6 mEq/L (ref 3.5–5.3)

## 2010-10-14 LAB — CBC WITH DIFFERENTIAL/PLATELET
BASO%: 0.7 % (ref 0.0–2.0)
HCT: 30.5 % — ABNORMAL LOW (ref 34.8–46.6)
MCHC: 32.9 g/dL (ref 31.5–36.0)
MONO#: 0.4 10*3/uL (ref 0.1–0.9)
NEUT#: 3.1 10*3/uL (ref 1.5–6.5)
RBC: 3.16 10*6/uL — ABNORMAL LOW (ref 3.70–5.45)
WBC: 6.2 10*3/uL (ref 3.9–10.3)
lymph#: 2.5 10*3/uL (ref 0.9–3.3)

## 2010-11-02 ENCOUNTER — Other Ambulatory Visit (HOSPITAL_COMMUNITY): Payer: Self-pay | Admitting: Oncology

## 2010-11-02 ENCOUNTER — Encounter (HOSPITAL_BASED_OUTPATIENT_CLINIC_OR_DEPARTMENT_OTHER): Payer: Medicare Other | Admitting: Oncology

## 2010-11-02 DIAGNOSIS — C189 Malignant neoplasm of colon, unspecified: Secondary | ICD-10-CM

## 2010-11-02 LAB — CBC WITH DIFFERENTIAL/PLATELET
Basophils Absolute: 0 10*3/uL (ref 0.0–0.1)
Eosinophils Absolute: 0.1 10*3/uL (ref 0.0–0.5)
HGB: 10.1 g/dL — ABNORMAL LOW (ref 11.6–15.9)
LYMPH%: 57.6 % — ABNORMAL HIGH (ref 14.0–49.7)
MCV: 95.3 fL (ref 79.5–101.0)
MONO%: 8.8 % (ref 0.0–14.0)
NEUT#: 0.8 10*3/uL — ABNORMAL LOW (ref 1.5–6.5)
Platelets: 79 10*3/uL — ABNORMAL LOW (ref 145–400)

## 2010-11-09 ENCOUNTER — Other Ambulatory Visit (HOSPITAL_COMMUNITY): Payer: Self-pay | Admitting: Oncology

## 2010-11-09 ENCOUNTER — Encounter (HOSPITAL_BASED_OUTPATIENT_CLINIC_OR_DEPARTMENT_OTHER): Payer: Medicare Other | Admitting: Oncology

## 2010-11-09 DIAGNOSIS — C9000 Multiple myeloma not having achieved remission: Secondary | ICD-10-CM

## 2010-11-09 LAB — CBC WITH DIFFERENTIAL/PLATELET
BASO%: 1.1 % (ref 0.0–2.0)
EOS%: 2.8 % (ref 0.0–7.0)
HCT: 30.1 % — ABNORMAL LOW (ref 34.8–46.6)
LYMPH%: 58.1 % — ABNORMAL HIGH (ref 14.0–49.7)
MCH: 30.5 pg (ref 25.1–34.0)
MCHC: 32.2 g/dL (ref 31.5–36.0)
MCV: 94.7 fL (ref 79.5–101.0)
MONO%: 10 % (ref 0.0–14.0)
NEUT%: 28 % — ABNORMAL LOW (ref 38.4–76.8)
Platelets: 156 10*3/uL (ref 145–400)

## 2010-11-16 ENCOUNTER — Encounter (HOSPITAL_BASED_OUTPATIENT_CLINIC_OR_DEPARTMENT_OTHER): Payer: Medicare Other | Admitting: Oncology

## 2010-11-16 ENCOUNTER — Other Ambulatory Visit (HOSPITAL_COMMUNITY): Payer: Self-pay | Admitting: Oncology

## 2010-11-16 DIAGNOSIS — C9000 Multiple myeloma not having achieved remission: Secondary | ICD-10-CM

## 2010-11-16 LAB — CBC WITH DIFFERENTIAL/PLATELET
Basophils Absolute: 0 10*3/uL (ref 0.0–0.1)
EOS%: 0.7 % (ref 0.0–7.0)
HGB: 10.2 g/dL — ABNORMAL LOW (ref 11.6–15.9)
MCH: 31 pg (ref 25.1–34.0)
MCV: 94.5 fL (ref 79.5–101.0)
MONO%: 9.5 % (ref 0.0–14.0)
RDW: 16.9 % — ABNORMAL HIGH (ref 11.2–14.5)

## 2010-11-23 ENCOUNTER — Other Ambulatory Visit (HOSPITAL_COMMUNITY): Payer: Self-pay | Admitting: Oncology

## 2010-11-23 ENCOUNTER — Encounter (HOSPITAL_BASED_OUTPATIENT_CLINIC_OR_DEPARTMENT_OTHER): Payer: Medicare Other | Admitting: Oncology

## 2010-11-23 DIAGNOSIS — C9000 Multiple myeloma not having achieved remission: Secondary | ICD-10-CM

## 2010-11-23 LAB — CBC WITH DIFFERENTIAL/PLATELET
BASO%: 0.5 % (ref 0.0–2.0)
EOS%: 2 % (ref 0.0–7.0)
HCT: 28.1 % — ABNORMAL LOW (ref 34.8–46.6)
MCH: 32.5 pg (ref 25.1–34.0)
MCHC: 33.9 g/dL (ref 31.5–36.0)
MONO#: 0.4 10*3/uL (ref 0.1–0.9)
NEUT%: 47.9 % (ref 38.4–76.8)
RBC: 2.93 10*6/uL — ABNORMAL LOW (ref 3.70–5.45)
RDW: 18.6 % — ABNORMAL HIGH (ref 11.2–14.5)
WBC: 4.7 10*3/uL (ref 3.9–10.3)
lymph#: 1.9 10*3/uL (ref 0.9–3.3)

## 2010-11-30 ENCOUNTER — Other Ambulatory Visit (HOSPITAL_COMMUNITY): Payer: Self-pay | Admitting: Oncology

## 2010-11-30 ENCOUNTER — Encounter (HOSPITAL_BASED_OUTPATIENT_CLINIC_OR_DEPARTMENT_OTHER): Payer: Medicare Other | Admitting: Oncology

## 2010-11-30 DIAGNOSIS — D630 Anemia in neoplastic disease: Secondary | ICD-10-CM

## 2010-11-30 DIAGNOSIS — R634 Abnormal weight loss: Secondary | ICD-10-CM

## 2010-11-30 DIAGNOSIS — E538 Deficiency of other specified B group vitamins: Secondary | ICD-10-CM

## 2010-11-30 DIAGNOSIS — C9000 Multiple myeloma not having achieved remission: Secondary | ICD-10-CM

## 2010-11-30 DIAGNOSIS — D649 Anemia, unspecified: Secondary | ICD-10-CM

## 2010-11-30 LAB — CBC WITH DIFFERENTIAL/PLATELET
BASO%: 0.4 % (ref 0.0–2.0)
Basophils Absolute: 0 10*3/uL (ref 0.0–0.1)
EOS%: 2.4 % (ref 0.0–7.0)
HGB: 9.4 g/dL — ABNORMAL LOW (ref 11.6–15.9)
MCH: 32.7 pg (ref 25.1–34.0)
MCHC: 33.6 g/dL (ref 31.5–36.0)
MONO#: 0.7 10*3/uL (ref 0.1–0.9)
RDW: 18 % — ABNORMAL HIGH (ref 11.2–14.5)
WBC: 4.8 10*3/uL (ref 3.9–10.3)
lymph#: 1.6 10*3/uL (ref 0.9–3.3)

## 2010-12-01 LAB — COMPREHENSIVE METABOLIC PANEL
Alkaline Phosphatase: 48 U/L (ref 39–117)
CO2: 21 mEq/L (ref 19–32)
Creatinine, Ser: 0.99 mg/dL (ref 0.50–1.10)
Glucose, Bld: 167 mg/dL — ABNORMAL HIGH (ref 70–99)
Sodium: 134 mEq/L — ABNORMAL LOW (ref 135–145)
Total Bilirubin: 0.3 mg/dL (ref 0.3–1.2)
Total Protein: 6.5 g/dL (ref 6.0–8.3)

## 2010-12-01 LAB — VITAMIN B12: Vitamin B-12: 524 pg/mL (ref 211–911)

## 2010-12-01 LAB — VITAMIN D 25 HYDROXY (VIT D DEFICIENCY, FRACTURES): Vit D, 25-Hydroxy: 39 ng/mL (ref 30–89)

## 2010-12-01 LAB — LACTATE DEHYDROGENASE: LDH: 113 U/L (ref 94–250)

## 2010-12-01 LAB — MAGNESIUM: Magnesium: 1.3 mg/dL — ABNORMAL LOW (ref 1.5–2.5)

## 2010-12-14 ENCOUNTER — Encounter (HOSPITAL_BASED_OUTPATIENT_CLINIC_OR_DEPARTMENT_OTHER): Payer: Medicare Other | Admitting: Oncology

## 2010-12-14 ENCOUNTER — Other Ambulatory Visit (HOSPITAL_COMMUNITY): Payer: Self-pay | Admitting: Oncology

## 2010-12-14 DIAGNOSIS — M810 Age-related osteoporosis without current pathological fracture: Secondary | ICD-10-CM

## 2010-12-14 DIAGNOSIS — C9000 Multiple myeloma not having achieved remission: Secondary | ICD-10-CM

## 2010-12-14 DIAGNOSIS — D649 Anemia, unspecified: Secondary | ICD-10-CM

## 2010-12-14 LAB — CBC WITH DIFFERENTIAL/PLATELET
Basophils Absolute: 0 10*3/uL (ref 0.0–0.1)
Eosinophils Absolute: 0.1 10*3/uL (ref 0.0–0.5)
HGB: 9.9 g/dL — ABNORMAL LOW (ref 11.6–15.9)
MONO#: 0.7 10*3/uL (ref 0.1–0.9)
MONO%: 13.5 % (ref 0.0–14.0)
NEUT#: 2.3 10*3/uL (ref 1.5–6.5)
RBC: 3.05 10*6/uL — ABNORMAL LOW (ref 3.70–5.45)
RDW: 18.8 % — ABNORMAL HIGH (ref 11.2–14.5)
WBC: 5.1 10*3/uL (ref 3.9–10.3)
lymph#: 2 10*3/uL (ref 0.9–3.3)

## 2010-12-14 LAB — MAGNESIUM: Magnesium: 1.4 mg/dL — ABNORMAL LOW (ref 1.5–2.5)

## 2010-12-14 LAB — BASIC METABOLIC PANEL
BUN: 12 mg/dL (ref 6–23)
Chloride: 99 mEq/L (ref 96–112)
Creatinine, Ser: 0.79 mg/dL (ref 0.50–1.10)
Glucose, Bld: 143 mg/dL — ABNORMAL HIGH (ref 70–99)

## 2010-12-28 ENCOUNTER — Encounter (HOSPITAL_BASED_OUTPATIENT_CLINIC_OR_DEPARTMENT_OTHER): Payer: Medicare Other | Admitting: Oncology

## 2010-12-28 ENCOUNTER — Other Ambulatory Visit (HOSPITAL_COMMUNITY): Payer: Self-pay | Admitting: Oncology

## 2010-12-28 DIAGNOSIS — C189 Malignant neoplasm of colon, unspecified: Secondary | ICD-10-CM

## 2010-12-28 LAB — CBC WITH DIFFERENTIAL/PLATELET
BASO%: 0.4 % (ref 0.0–2.0)
Eosinophils Absolute: 0.1 10*3/uL (ref 0.0–0.5)
MCHC: 33 g/dL (ref 31.5–36.0)
MCV: 97.6 fL (ref 79.5–101.0)
MONO%: 13.3 % (ref 0.0–14.0)
NEUT#: 2.8 10*3/uL (ref 1.5–6.5)
RBC: 3.46 10*6/uL — ABNORMAL LOW (ref 3.70–5.45)
RDW: 18 % — ABNORMAL HIGH (ref 11.2–14.5)
WBC: 5 10*3/uL (ref 3.9–10.3)

## 2010-12-28 LAB — BASIC METABOLIC PANEL
Glucose, Bld: 222 mg/dL — ABNORMAL HIGH (ref 70–99)
Potassium: 4.3 mEq/L (ref 3.5–5.3)
Sodium: 134 mEq/L — ABNORMAL LOW (ref 135–145)

## 2011-01-11 ENCOUNTER — Other Ambulatory Visit (HOSPITAL_COMMUNITY): Payer: Self-pay | Admitting: Oncology

## 2011-01-11 ENCOUNTER — Encounter (HOSPITAL_BASED_OUTPATIENT_CLINIC_OR_DEPARTMENT_OTHER): Payer: Medicare Other | Admitting: Oncology

## 2011-01-11 DIAGNOSIS — C9000 Multiple myeloma not having achieved remission: Secondary | ICD-10-CM

## 2011-01-11 LAB — CBC WITH DIFFERENTIAL/PLATELET
BASO%: 0.5 % (ref 0.0–2.0)
Basophils Absolute: 0 10*3/uL (ref 0.0–0.1)
EOS%: 1.6 % (ref 0.0–7.0)
HCT: 32.2 % — ABNORMAL LOW (ref 34.8–46.6)
HGB: 10.4 g/dL — ABNORMAL LOW (ref 11.6–15.9)
LYMPH%: 32.5 % (ref 14.0–49.7)
MCH: 30.5 pg (ref 25.1–34.0)
MCHC: 32.3 g/dL (ref 31.5–36.0)
MONO#: 1.1 10*3/uL — ABNORMAL HIGH (ref 0.1–0.9)
NEUT%: 48.9 % (ref 38.4–76.8)
Platelets: 163 10*3/uL (ref 145–400)
lymph#: 2.1 10*3/uL (ref 0.9–3.3)

## 2011-01-11 LAB — BASIC METABOLIC PANEL
BUN: 19 mg/dL (ref 6–23)
Calcium: 9.3 mg/dL (ref 8.4–10.5)
Glucose, Bld: 156 mg/dL — ABNORMAL HIGH (ref 70–99)
Potassium: 5.2 mEq/L (ref 3.5–5.3)

## 2011-01-24 ENCOUNTER — Other Ambulatory Visit (HOSPITAL_COMMUNITY): Payer: Self-pay | Admitting: Oncology

## 2011-01-24 ENCOUNTER — Encounter (HOSPITAL_BASED_OUTPATIENT_CLINIC_OR_DEPARTMENT_OTHER): Payer: Medicare Other | Admitting: Oncology

## 2011-01-24 DIAGNOSIS — D649 Anemia, unspecified: Secondary | ICD-10-CM

## 2011-01-24 DIAGNOSIS — M810 Age-related osteoporosis without current pathological fracture: Secondary | ICD-10-CM

## 2011-01-24 DIAGNOSIS — C9000 Multiple myeloma not having achieved remission: Secondary | ICD-10-CM

## 2011-01-24 LAB — CBC WITH DIFFERENTIAL/PLATELET
BASO%: 0.7 % (ref 0.0–2.0)
Eosinophils Absolute: 0.2 10*3/uL (ref 0.0–0.5)
MCHC: 34.1 g/dL (ref 31.5–36.0)
MONO#: 0.5 10*3/uL (ref 0.1–0.9)
NEUT#: 4.3 10*3/uL (ref 1.5–6.5)
Platelets: 187 10*3/uL (ref 145–400)
RBC: 3.25 10*6/uL — ABNORMAL LOW (ref 3.70–5.45)
RDW: 17.5 % — ABNORMAL HIGH (ref 11.2–14.5)
WBC: 7 10*3/uL (ref 3.9–10.3)
lymph#: 2 10*3/uL (ref 0.9–3.3)

## 2011-01-24 LAB — LACTATE DEHYDROGENASE: LDH: 146 U/L (ref 94–250)

## 2011-01-24 LAB — COMPREHENSIVE METABOLIC PANEL
ALT: 17 U/L (ref 0–35)
AST: 18 U/L (ref 0–37)
Albumin: 3.7 g/dL (ref 3.5–5.2)
CO2: 23 mEq/L (ref 19–32)
Calcium: 9.8 mg/dL (ref 8.4–10.5)
Chloride: 92 mEq/L — ABNORMAL LOW (ref 96–112)
Creatinine, Ser: 0.89 mg/dL (ref 0.50–1.10)
Potassium: 4.8 mEq/L (ref 3.5–5.3)
Sodium: 125 mEq/L — ABNORMAL LOW (ref 135–145)
Total Protein: 7.3 g/dL (ref 6.0–8.3)

## 2011-01-24 LAB — URIC ACID: Uric Acid, Serum: 4.2 mg/dL (ref 2.4–7.0)

## 2011-01-24 LAB — MAGNESIUM: Magnesium: 1.6 mg/dL (ref 1.5–2.5)

## 2011-01-25 LAB — IGA: IgA: 831 mg/dL — ABNORMAL HIGH (ref 69–380)

## 2011-02-07 ENCOUNTER — Other Ambulatory Visit (HOSPITAL_COMMUNITY): Payer: Self-pay | Admitting: Oncology

## 2011-02-07 ENCOUNTER — Encounter (HOSPITAL_BASED_OUTPATIENT_CLINIC_OR_DEPARTMENT_OTHER): Payer: Medicare Other | Admitting: Oncology

## 2011-02-07 DIAGNOSIS — M810 Age-related osteoporosis without current pathological fracture: Secondary | ICD-10-CM

## 2011-02-07 DIAGNOSIS — C9000 Multiple myeloma not having achieved remission: Secondary | ICD-10-CM

## 2011-02-07 DIAGNOSIS — D649 Anemia, unspecified: Secondary | ICD-10-CM

## 2011-02-07 LAB — CBC WITH DIFFERENTIAL/PLATELET
Basophils Absolute: 0 10*3/uL (ref 0.0–0.1)
EOS%: 3.1 % (ref 0.0–7.0)
HGB: 10.6 g/dL — ABNORMAL LOW (ref 11.6–15.9)
MCH: 30.9 pg (ref 25.1–34.0)
MCV: 91.5 fL (ref 79.5–101.0)
MONO%: 14.5 % — ABNORMAL HIGH (ref 0.0–14.0)
RBC: 3.43 10*6/uL — ABNORMAL LOW (ref 3.70–5.45)
RDW: 16 % — ABNORMAL HIGH (ref 11.2–14.5)

## 2011-02-08 LAB — DIFFERENTIAL
Basophils Absolute: 0
Basophils Relative: 0
Eosinophils Absolute: 0.2
Eosinophils Relative: 2
Monocytes Absolute: 0.8
Monocytes Relative: 10

## 2011-02-08 LAB — CBC
Hemoglobin: 9.5 — ABNORMAL LOW
MCHC: 33
MCV: 89.5
RBC: 3.21 — ABNORMAL LOW
RDW: 20.3 — ABNORMAL HIGH

## 2011-02-14 ENCOUNTER — Other Ambulatory Visit: Payer: Self-pay | Admitting: Physician Assistant

## 2011-02-14 ENCOUNTER — Encounter (HOSPITAL_BASED_OUTPATIENT_CLINIC_OR_DEPARTMENT_OTHER): Payer: Medicare Other | Admitting: Oncology

## 2011-02-14 DIAGNOSIS — C9000 Multiple myeloma not having achieved remission: Secondary | ICD-10-CM

## 2011-02-14 DIAGNOSIS — D630 Anemia in neoplastic disease: Secondary | ICD-10-CM

## 2011-02-14 DIAGNOSIS — M810 Age-related osteoporosis without current pathological fracture: Secondary | ICD-10-CM

## 2011-02-14 LAB — CBC WITH DIFFERENTIAL/PLATELET
Basophils Absolute: 0 10*3/uL (ref 0.0–0.1)
EOS%: 3.4 % (ref 0.0–7.0)
HCT: 28.4 % — ABNORMAL LOW (ref 34.8–46.6)
HGB: 9.3 g/dL — ABNORMAL LOW (ref 11.6–15.9)
LYMPH%: 41.4 % (ref 14.0–49.7)
MCH: 30.1 pg (ref 25.1–34.0)
MCHC: 32.7 g/dL (ref 31.5–36.0)
MCV: 91.9 fL (ref 79.5–101.0)
MONO%: 6.6 % (ref 0.0–14.0)
NEUT%: 48.3 % (ref 38.4–76.8)
Platelets: 139 10*3/uL — ABNORMAL LOW (ref 145–400)
lymph#: 1.5 10*3/uL (ref 0.9–3.3)

## 2011-02-14 LAB — COMPREHENSIVE METABOLIC PANEL
AST: 18 U/L (ref 0–37)
BUN: 18 mg/dL (ref 6–23)
Calcium: 8.7 mg/dL (ref 8.4–10.5)
Chloride: 96 mEq/L (ref 96–112)
Creatinine, Ser: 1.31 mg/dL — ABNORMAL HIGH (ref 0.50–1.10)
Total Bilirubin: 0.4 mg/dL (ref 0.3–1.2)

## 2011-02-22 ENCOUNTER — Encounter (HOSPITAL_BASED_OUTPATIENT_CLINIC_OR_DEPARTMENT_OTHER): Payer: Medicare Other | Admitting: Oncology

## 2011-02-22 ENCOUNTER — Other Ambulatory Visit (HOSPITAL_COMMUNITY): Payer: Self-pay | Admitting: Oncology

## 2011-02-22 DIAGNOSIS — C9000 Multiple myeloma not having achieved remission: Secondary | ICD-10-CM

## 2011-02-22 DIAGNOSIS — D649 Anemia, unspecified: Secondary | ICD-10-CM

## 2011-02-22 DIAGNOSIS — M810 Age-related osteoporosis without current pathological fracture: Secondary | ICD-10-CM

## 2011-02-22 LAB — CBC WITH DIFFERENTIAL/PLATELET
Eosinophils Absolute: 0.1 10*3/uL (ref 0.0–0.5)
HGB: 8.9 g/dL — ABNORMAL LOW (ref 11.6–15.9)
MONO#: 0.5 10*3/uL (ref 0.1–0.9)
NEUT#: 2 10*3/uL (ref 1.5–6.5)
Platelets: 145 10*3/uL (ref 145–400)
RBC: 2.79 10*6/uL — ABNORMAL LOW (ref 3.70–5.45)
RDW: 18.1 % — ABNORMAL HIGH (ref 11.2–14.5)
WBC: 3.9 10*3/uL (ref 3.9–10.3)

## 2011-02-22 LAB — BASIC METABOLIC PANEL
CO2: 24 mEq/L (ref 19–32)
Glucose, Bld: 120 mg/dL — ABNORMAL HIGH (ref 70–99)
Potassium: 4.4 mEq/L (ref 3.5–5.3)
Sodium: 135 mEq/L (ref 135–145)

## 2011-03-08 ENCOUNTER — Encounter (HOSPITAL_BASED_OUTPATIENT_CLINIC_OR_DEPARTMENT_OTHER): Payer: Medicare Other | Admitting: Oncology

## 2011-03-08 ENCOUNTER — Other Ambulatory Visit (HOSPITAL_COMMUNITY): Payer: Self-pay | Admitting: Oncology

## 2011-03-08 DIAGNOSIS — C9000 Multiple myeloma not having achieved remission: Secondary | ICD-10-CM

## 2011-03-08 DIAGNOSIS — E538 Deficiency of other specified B group vitamins: Secondary | ICD-10-CM

## 2011-03-08 DIAGNOSIS — M810 Age-related osteoporosis without current pathological fracture: Secondary | ICD-10-CM

## 2011-03-08 DIAGNOSIS — D649 Anemia, unspecified: Secondary | ICD-10-CM

## 2011-03-08 LAB — CBC WITH DIFFERENTIAL/PLATELET
BASO%: 0.6 % (ref 0.0–2.0)
EOS%: 1.1 % (ref 0.0–7.0)
HCT: 29 % — ABNORMAL LOW (ref 34.8–46.6)
LYMPH%: 29.6 % (ref 14.0–49.7)
MCH: 31.9 pg (ref 25.1–34.0)
MCHC: 32.7 g/dL (ref 31.5–36.0)
MCV: 97.6 fL (ref 79.5–101.0)
NEUT%: 61.2 % (ref 38.4–76.8)
Platelets: 200 10*3/uL (ref 145–400)

## 2011-03-09 LAB — LACTATE DEHYDROGENASE: LDH: 120 U/L (ref 94–250)

## 2011-03-09 LAB — COMPREHENSIVE METABOLIC PANEL
ALT: 10 U/L (ref 0–35)
AST: 16 U/L (ref 0–37)
Albumin: 3.9 g/dL (ref 3.5–5.2)
Alkaline Phosphatase: 47 U/L (ref 39–117)
Chloride: 102 mEq/L (ref 96–112)
Potassium: 4.5 mEq/L (ref 3.5–5.3)
Sodium: 135 mEq/L (ref 135–145)
Total Protein: 6.9 g/dL (ref 6.0–8.3)

## 2011-03-09 LAB — KAPPA/LAMBDA LIGHT CHAINS: Kappa:Lambda Ratio: 11.74 — ABNORMAL HIGH (ref 0.26–1.65)

## 2011-03-14 ENCOUNTER — Other Ambulatory Visit (HOSPITAL_COMMUNITY): Payer: Self-pay | Admitting: Oncology

## 2011-03-14 ENCOUNTER — Encounter (HOSPITAL_BASED_OUTPATIENT_CLINIC_OR_DEPARTMENT_OTHER): Payer: Medicare Other | Admitting: Oncology

## 2011-03-14 DIAGNOSIS — C9000 Multiple myeloma not having achieved remission: Secondary | ICD-10-CM

## 2011-03-14 DIAGNOSIS — D649 Anemia, unspecified: Secondary | ICD-10-CM

## 2011-03-14 DIAGNOSIS — M810 Age-related osteoporosis without current pathological fracture: Secondary | ICD-10-CM

## 2011-03-14 LAB — CBC WITH DIFFERENTIAL/PLATELET
Basophils Absolute: 0 10*3/uL (ref 0.0–0.1)
Eosinophils Absolute: 0.2 10*3/uL (ref 0.0–0.5)
HGB: 10.4 g/dL — ABNORMAL LOW (ref 11.6–15.9)
MCV: 97.9 fL (ref 79.5–101.0)
MONO%: 7.5 % (ref 0.0–14.0)
NEUT#: 2.4 10*3/uL (ref 1.5–6.5)
RDW: 20.5 % — ABNORMAL HIGH (ref 11.2–14.5)
lymph#: 1.8 10*3/uL (ref 0.9–3.3)

## 2011-03-22 ENCOUNTER — Ambulatory Visit: Payer: Medicare Other

## 2011-03-22 ENCOUNTER — Other Ambulatory Visit (HOSPITAL_COMMUNITY): Payer: Self-pay | Admitting: Oncology

## 2011-03-22 ENCOUNTER — Other Ambulatory Visit (HOSPITAL_BASED_OUTPATIENT_CLINIC_OR_DEPARTMENT_OTHER): Payer: Medicare Other | Admitting: Lab

## 2011-03-22 ENCOUNTER — Other Ambulatory Visit: Payer: Self-pay | Admitting: Medical Oncology

## 2011-03-22 DIAGNOSIS — C9000 Multiple myeloma not having achieved remission: Secondary | ICD-10-CM

## 2011-03-22 DIAGNOSIS — D649 Anemia, unspecified: Secondary | ICD-10-CM

## 2011-03-22 LAB — CBC WITH DIFFERENTIAL/PLATELET
Basophils Absolute: 0 10*3/uL (ref 0.0–0.1)
Eosinophils Absolute: 0.1 10*3/uL (ref 0.0–0.5)
HGB: 10.6 g/dL — ABNORMAL LOW (ref 11.6–15.9)
LYMPH%: 25.5 % (ref 14.0–49.7)
MCV: 96.4 fL (ref 79.5–101.0)
MONO%: 17.1 % — ABNORMAL HIGH (ref 0.0–14.0)
NEUT#: 3.4 10*3/uL (ref 1.5–6.5)
NEUT%: 55.4 % (ref 38.4–76.8)
Platelets: 141 10*3/uL — ABNORMAL LOW (ref 145–400)

## 2011-03-22 LAB — BASIC METABOLIC PANEL
CO2: 24 mEq/L (ref 19–32)
Calcium: 9 mg/dL (ref 8.4–10.5)
Chloride: 101 mEq/L (ref 96–112)
Glucose, Bld: 159 mg/dL — ABNORMAL HIGH (ref 70–99)
Sodium: 134 mEq/L — ABNORMAL LOW (ref 135–145)

## 2011-03-22 MED ORDER — DARBEPOETIN ALFA-POLYSORBATE 300 MCG/0.6ML IJ SOLN
300.0000 ug | Freq: Once | INTRAMUSCULAR | Status: DC
  Filled 2011-03-22: qty 0.6

## 2011-03-29 ENCOUNTER — Other Ambulatory Visit (HOSPITAL_BASED_OUTPATIENT_CLINIC_OR_DEPARTMENT_OTHER): Payer: Medicare Other | Admitting: Lab

## 2011-03-29 ENCOUNTER — Other Ambulatory Visit (HOSPITAL_COMMUNITY): Payer: Self-pay | Admitting: Oncology

## 2011-03-29 DIAGNOSIS — D649 Anemia, unspecified: Secondary | ICD-10-CM

## 2011-03-29 DIAGNOSIS — M810 Age-related osteoporosis without current pathological fracture: Secondary | ICD-10-CM

## 2011-03-29 DIAGNOSIS — C9000 Multiple myeloma not having achieved remission: Secondary | ICD-10-CM

## 2011-03-29 LAB — CBC WITH DIFFERENTIAL/PLATELET
BASO%: 0.5 % (ref 0.0–2.0)
Basophils Absolute: 0 10*3/uL (ref 0.0–0.1)
EOS%: 4.1 % (ref 0.0–7.0)
HCT: 32.8 % — ABNORMAL LOW (ref 34.8–46.6)
LYMPH%: 44.4 % (ref 14.0–49.7)
MCH: 31.5 pg (ref 25.1–34.0)
MCHC: 32.7 g/dL (ref 31.5–36.0)
MCV: 96.6 fL (ref 79.5–101.0)
MONO%: 6.3 % (ref 0.0–14.0)
NEUT%: 44.7 % (ref 38.4–76.8)
Platelets: 134 10*3/uL — ABNORMAL LOW (ref 145–400)
lymph#: 1.8 10*3/uL (ref 0.9–3.3)

## 2011-03-31 ENCOUNTER — Telehealth: Payer: Self-pay | Admitting: *Deleted

## 2011-03-31 NOTE — Telephone Encounter (Signed)
RECEIVED A FAX FROM [facility] CONCERNING A PRESCRIPTION REFILL REQUEST FOR REVLIMID. THIS REQUEST WAS GIVEN TO DR.MURINSON'S NURSE, KATHY BUYCK,RN. 

## 2011-04-01 ENCOUNTER — Other Ambulatory Visit: Payer: Self-pay

## 2011-04-01 ENCOUNTER — Telehealth: Payer: Self-pay

## 2011-04-01 DIAGNOSIS — D630 Anemia in neoplastic disease: Secondary | ICD-10-CM

## 2011-04-01 MED ORDER — LENALIDOMIDE 10 MG PO CAPS: 10.0000 mg | ORAL_CAPSULE | ORAL | Status: DC

## 2011-04-01 NOTE — Telephone Encounter (Signed)
Christina Hopkins called to pt is due to start Revlimid next Tues and have not received call yet from pharmacy. Told her Dr Arline Asp signed the form today and she should be getting call today or Monday.

## 2011-04-02 ENCOUNTER — Other Ambulatory Visit: Payer: Self-pay | Admitting: Oncology

## 2011-04-02 DIAGNOSIS — D649 Anemia, unspecified: Secondary | ICD-10-CM

## 2011-04-02 DIAGNOSIS — C9000 Multiple myeloma not having achieved remission: Secondary | ICD-10-CM | POA: Insufficient documentation

## 2011-04-02 DIAGNOSIS — D509 Iron deficiency anemia, unspecified: Secondary | ICD-10-CM

## 2011-04-02 DIAGNOSIS — D638 Anemia in other chronic diseases classified elsewhere: Secondary | ICD-10-CM | POA: Insufficient documentation

## 2011-04-02 DIAGNOSIS — E559 Vitamin D deficiency, unspecified: Secondary | ICD-10-CM | POA: Insufficient documentation

## 2011-04-02 DIAGNOSIS — D519 Vitamin B12 deficiency anemia, unspecified: Secondary | ICD-10-CM | POA: Insufficient documentation

## 2011-04-04 ENCOUNTER — Encounter: Payer: Self-pay | Admitting: Medical Oncology

## 2011-04-05 ENCOUNTER — Other Ambulatory Visit: Payer: Self-pay | Admitting: Medical Oncology

## 2011-04-05 ENCOUNTER — Ambulatory Visit (HOSPITAL_BASED_OUTPATIENT_CLINIC_OR_DEPARTMENT_OTHER): Payer: Medicare Other | Admitting: Oncology

## 2011-04-05 ENCOUNTER — Other Ambulatory Visit (HOSPITAL_COMMUNITY): Payer: Self-pay | Admitting: Oncology

## 2011-04-05 ENCOUNTER — Ambulatory Visit (HOSPITAL_BASED_OUTPATIENT_CLINIC_OR_DEPARTMENT_OTHER): Payer: Medicare Other

## 2011-04-05 ENCOUNTER — Other Ambulatory Visit (HOSPITAL_BASED_OUTPATIENT_CLINIC_OR_DEPARTMENT_OTHER): Payer: Medicare Other

## 2011-04-05 ENCOUNTER — Encounter: Payer: Self-pay | Admitting: *Deleted

## 2011-04-05 DIAGNOSIS — C9000 Multiple myeloma not having achieved remission: Secondary | ICD-10-CM

## 2011-04-05 DIAGNOSIS — E119 Type 2 diabetes mellitus without complications: Secondary | ICD-10-CM | POA: Insufficient documentation

## 2011-04-05 DIAGNOSIS — E785 Hyperlipidemia, unspecified: Secondary | ICD-10-CM

## 2011-04-05 DIAGNOSIS — I2510 Atherosclerotic heart disease of native coronary artery without angina pectoris: Secondary | ICD-10-CM | POA: Insufficient documentation

## 2011-04-05 DIAGNOSIS — D509 Iron deficiency anemia, unspecified: Secondary | ICD-10-CM

## 2011-04-05 DIAGNOSIS — D519 Vitamin B12 deficiency anemia, unspecified: Secondary | ICD-10-CM

## 2011-04-05 DIAGNOSIS — E538 Deficiency of other specified B group vitamins: Secondary | ICD-10-CM

## 2011-04-05 DIAGNOSIS — I10 Essential (primary) hypertension: Secondary | ICD-10-CM

## 2011-04-05 DIAGNOSIS — E612 Magnesium deficiency: Secondary | ICD-10-CM

## 2011-04-05 LAB — CBC WITH DIFFERENTIAL/PLATELET
Basophils Absolute: 0 10*3/uL (ref 0.0–0.1)
EOS%: 1.4 % (ref 0.0–7.0)
HCT: 32.6 % — ABNORMAL LOW (ref 34.8–46.6)
HGB: 10.4 g/dL — ABNORMAL LOW (ref 11.6–15.9)
MCH: 30.2 pg (ref 25.1–34.0)
MONO#: 0.9 10*3/uL (ref 0.1–0.9)
NEUT#: 2.8 10*3/uL (ref 1.5–6.5)
RDW: 16.5 % — ABNORMAL HIGH (ref 11.2–14.5)
WBC: 6.3 10*3/uL (ref 3.9–10.3)
lymph#: 2.6 10*3/uL (ref 0.9–3.3)

## 2011-04-05 MED ORDER — ZOLEDRONIC ACID 4 MG/5ML IV CONC
3.5000 mg | Freq: Once | INTRAVENOUS | Status: AC
  Administered 2011-04-05: 3.5 mg via INTRAVENOUS
  Filled 2011-04-05: qty 4.38

## 2011-04-05 MED ORDER — PROCHLORPERAZINE MALEATE 10 MG PO TABS: 10.0000 mg | ORAL_TABLET | Freq: Four times a day (QID) | ORAL | Status: DC | PRN

## 2011-04-05 NOTE — Progress Notes (Signed)
RECEIVED A FAX FROM [facility] CONCERNING A CONFIRMATION OF FACSIMILE RECEIPT AND PRESCRIPTION SHIPMENT. THESE NOTICES WERE GIVEN TO DR.MURINSON'S NURSE, ROBIN BASS,RN.

## 2011-04-05 NOTE — Patient Instructions (Signed)
Pt discharged at 1410.  Reviewed AVS with patient.

## 2011-04-05 NOTE — Progress Notes (Signed)
CC:   [name], M.D.  HISTORY:  Christina Hopkins was seen today for followup of her IgA kappa multiple myeloma.  Christina Hopkins is accompanied by her daughter, Christina Hopkins.  She was last seen by [place] on 03/08/2011.  There has not been much change in the patient's overall condition.  She takes Revlimid 10 mg 1 week on 1 week off.  Unfortunately, there been some delays in the patient's Revlimid treatments due to some vacation time, and the patient is reluctant to take the Revlimid during or near those times.  I do not think she has significant side effects from the Revlimid, but I do recall that she sometimes feels tired with not very much energy.  Other treatments consist of Zometa 3.5 mg being given every 2 months, also Aranesp 300 mcg subcutaneous every 2 weeks whenever the hemoglobin is less than 10.  Christina Hopkins denies any major changes in her condition.  MEDICAL PROBLEMS:  Are as follows: 1. Multiple myeloma initially presenting as an IgA kappa monoclonal     gammopathy in February 2009.  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  Bone marrow on 08/31/2007 showed     46% plasma cells.  There was 73% plasma cells on repeat bone marrow     08/05/2009.  We initiated treatment with Revlimid, Aranesp, and     subsequently Zometa in April of 2011.  The patient also had     received vitamin B12 shots for a while, but she is now taking oral     vitamin B12.  It appears the patient may have started getting     Zometa in October 2011. 2. Anemia secondary vitamin B12 deficiency. 3. Anemia secondary to iron deficiency. 4. Vitamin D deficiency. 5. Magnesium deficiency. 6. Diabetes mellitus. 7. Hypertension. 8. Dyslipidemia. 9. Coronary artery disease.  MEDICINES:  Reviewed and recorded.  The patient is taking vitamin B12 1000 mcg daily, vitamin D at least 1000 units by mouth, Aldactone 25 mg as  needed, magnesium oxide 400 mg 4 times a day, and of course, Revlimid 10 mg daily 1 week on 1 week off.  PHYSICAL EXAMINATION:  General: The patient shows little change.  Vital signs: Weight is 117 pounds 9.6 ounces, height 4 feet 10 inches, body surface area 1.48 m squared.  Blood pressure 111/61 on the right arm sitting.  Other vital signs are normal.  HEENT: There is no scleral icterus.  Mouth and pharynx are benign.  No peripheral adenopathy palpable.  Heart and lungs are normal.  The patient has a marked dorsal kyphosis.  Abdomen:  Benign.  Extremities: No peripheral edema.  The patient uses a 4-pronged cane which she holds in the right hand.  LABORATORY DATA:  Today, white count 6.3, ANC 2.8, hemoglobin 10.4, hematocrit 32.6, and platelets a 143,000.  Chemistries today are pending.  Chemistries from 03/08/2011 notable for a magnesium of 1.2 and a glucose of 148, otherwise normal.  Calcium was 9.4, total protein 6.9, and albumin 3.9.  The patient has had low magnesium levels in the past. On 01/24/2011, her magnesium was 1.6.  IgA level, unfortunately, has been rising with a level of 938 on 03/08/2011.  Prior to starting treatment on this patient her IgA level had been over 2000.  On 03/08/2011 the kappa lambda ratio was 11.74.  On 11/07/2010, 24-hour urine protein  was 40 mg.  Urine immunofixation electrophoresis was positive for monoclonal protein.  We have a urine kappa lambda ratio of 34.5.  In the urine, the immunofixation showed a monoclonal IgA heavy chain with associated kappa light chains.  The most recent metastatic bone survey goes back to 08/05/2009, although we do have other x-rays available.  IMPRESSION AND PLAN:  Christina Hopkins's clinical condition seems to be stable.  We are going to go ahead with Zometa today 3.5 mg IV.  The patient does not need Aranesp.  She apparently has started taking Revlimid again 10 mg daily 1 week on, 1 week off.  She is going to start her  Revlimid tonight.  I am concerned about the rising IgA level.  If this continues then the patient will more than likely need a program consisting of Velcade and possibly Decadron.  Other options would include melphalan, Cytoxan, and Doxil.  Aranesp is being given 300 mcg subcutaneous for hemoglobin less than 10, and because the patient's hemoglobin today is 10.4 we can hold off on Aranesp.  We will plan to see Christina Hopkins again in 4 weeks which should be around December 18th.  At that time we will check CBC, Chemistries, LDH, IgA level, magnesium, uric acid, vitamin B12 level, and serum kappa lambda light chains.    ______________________________ Samul Dada, M.D. DSM/MEDQ  D:  04/05/2011  T:  04/05/2011  Job:  [id number]

## 2011-04-05 NOTE — Telephone Encounter (Signed)
gve the pt's dtr the dec,jan 2013 appt calendar.

## 2011-04-05 NOTE — Progress Notes (Signed)
This office note has been dictated.  #[id number]

## 2011-04-08 LAB — IGA: IgA: 940 mg/dL — ABNORMAL HIGH (ref 69–380)

## 2011-04-08 LAB — KAPPA/LAMBDA LIGHT CHAINS
Kappa:Lambda Ratio: 6.52 — ABNORMAL HIGH (ref 0.26–1.65)
Lambda Free Lght Chn: 1.58 mg/dL (ref 0.57–2.63)

## 2011-04-08 LAB — COMPREHENSIVE METABOLIC PANEL
ALT: 12 U/L (ref 0–35)
AST: 21 U/L (ref 0–37)
Calcium: 9.4 mg/dL (ref 8.4–10.5)
Chloride: 98 mEq/L (ref 96–112)
Creatinine, Ser: 0.95 mg/dL (ref 0.50–1.10)
Potassium: 5 mEq/L (ref 3.5–5.3)
Sodium: 132 mEq/L — ABNORMAL LOW (ref 135–145)
Total Protein: 7 g/dL (ref 6.0–8.3)

## 2011-04-14 ENCOUNTER — Other Ambulatory Visit: Payer: Self-pay

## 2011-04-14 DIAGNOSIS — C189 Malignant neoplasm of colon, unspecified: Secondary | ICD-10-CM

## 2011-04-14 MED ORDER — MAGNESIUM OXIDE -MG SUPPLEMENT 400 MG PO CAPS: 400.0000 mg | ORAL_CAPSULE | Freq: Four times a day (QID) | ORAL | Status: DC

## 2011-04-15 ENCOUNTER — Other Ambulatory Visit: Payer: Self-pay | Admitting: Nurse Practitioner

## 2011-04-19 ENCOUNTER — Other Ambulatory Visit (HOSPITAL_BASED_OUTPATIENT_CLINIC_OR_DEPARTMENT_OTHER): Payer: Medicare Other | Admitting: Lab

## 2011-04-19 ENCOUNTER — Ambulatory Visit (HOSPITAL_BASED_OUTPATIENT_CLINIC_OR_DEPARTMENT_OTHER): Payer: Medicare Other

## 2011-04-19 ENCOUNTER — Other Ambulatory Visit (HOSPITAL_COMMUNITY): Payer: Self-pay | Admitting: Oncology

## 2011-04-19 DIAGNOSIS — D649 Anemia, unspecified: Secondary | ICD-10-CM

## 2011-04-19 DIAGNOSIS — C9000 Multiple myeloma not having achieved remission: Secondary | ICD-10-CM

## 2011-04-19 LAB — CBC WITH DIFFERENTIAL/PLATELET
BASO%: 0.7 % (ref 0.0–2.0)
Basophils Absolute: 0.1 10*3/uL (ref 0.0–0.1)
EOS%: 1.9 % (ref 0.0–7.0)
HGB: 9.6 g/dL — ABNORMAL LOW (ref 11.6–15.9)
MCH: 30.1 pg (ref 25.1–34.0)
MCHC: 32.1 g/dL (ref 31.5–36.0)
RDW: 16.6 % — ABNORMAL HIGH (ref 11.2–14.5)
lymph#: 2.4 10*3/uL (ref 0.9–3.3)

## 2011-04-19 MED ORDER — DARBEPOETIN ALFA-POLYSORBATE 500 MCG/ML IJ SOLN
300.0000 ug | Freq: Once | INTRAMUSCULAR | Status: AC
  Administered 2011-04-19: 300 ug via SUBCUTANEOUS
  Filled 2011-04-19: qty 1

## 2011-04-25 ENCOUNTER — Other Ambulatory Visit: Payer: Self-pay | Admitting: *Deleted

## 2011-04-25 NOTE — Telephone Encounter (Signed)
THIS REQUEST WAS GIVEN TO DR.MURINSON'S NURSE, ROBIN BASS,RN.

## 2011-04-26 ENCOUNTER — Encounter: Payer: Self-pay | Admitting: *Deleted

## 2011-04-26 ENCOUNTER — Other Ambulatory Visit: Payer: Self-pay | Admitting: *Deleted

## 2011-04-26 DIAGNOSIS — D630 Anemia in neoplastic disease: Secondary | ICD-10-CM

## 2011-04-26 MED ORDER — LENALIDOMIDE 10 MG PO CAPS: 10.0000 mg | ORAL_CAPSULE | ORAL | Status: DC

## 2011-04-26 NOTE — Progress Notes (Signed)
RECEIVED A FAX FROM [facility] CONCERNING A CONFIRMATION OF FACSIMILE RECEIPT. THIS NOTICE WAS GIVEN TO DR. MURINSON'S NURSE, ROBIN BASS,RN. 

## 2011-04-28 ENCOUNTER — Encounter: Payer: Self-pay | Admitting: *Deleted

## 2011-04-28 NOTE — Progress Notes (Signed)
RECEIVED A FAX FROM [facility] CONCERNING A CONFIRMATION OF PRESCRIPTION SHIPMENT. THIS NOTICE WAS GIVEN TO DR.MURINSON'S NURSE, KATHY BUYCK,RN. 

## 2011-05-03 ENCOUNTER — Telehealth: Payer: Self-pay | Admitting: Medical Oncology

## 2011-05-03 ENCOUNTER — Other Ambulatory Visit (HOSPITAL_COMMUNITY): Payer: Self-pay | Admitting: Oncology

## 2011-05-03 ENCOUNTER — Ambulatory Visit (HOSPITAL_BASED_OUTPATIENT_CLINIC_OR_DEPARTMENT_OTHER): Payer: Medicare Other | Admitting: Oncology

## 2011-05-03 ENCOUNTER — Other Ambulatory Visit: Payer: Self-pay | Admitting: Oncology

## 2011-05-03 ENCOUNTER — Other Ambulatory Visit (HOSPITAL_BASED_OUTPATIENT_CLINIC_OR_DEPARTMENT_OTHER): Payer: Medicare Other | Admitting: Lab

## 2011-05-03 DIAGNOSIS — C9000 Multiple myeloma not having achieved remission: Secondary | ICD-10-CM

## 2011-05-03 DIAGNOSIS — E559 Vitamin D deficiency, unspecified: Secondary | ICD-10-CM

## 2011-05-03 DIAGNOSIS — E612 Magnesium deficiency: Secondary | ICD-10-CM

## 2011-05-03 DIAGNOSIS — E538 Deficiency of other specified B group vitamins: Secondary | ICD-10-CM

## 2011-05-03 LAB — CBC WITH DIFFERENTIAL/PLATELET
BASO%: 0.9 % (ref 0.0–2.0)
Basophils Absolute: 0 10*3/uL (ref 0.0–0.1)
EOS%: 3.7 % (ref 0.0–7.0)
HGB: 10.4 g/dL — ABNORMAL LOW (ref 11.6–15.9)
MCH: 31.8 pg (ref 25.1–34.0)
MCHC: 33.3 g/dL (ref 31.5–36.0)
MCV: 95.5 fL (ref 79.5–101.0)
MONO%: 12 % (ref 0.0–14.0)
NEUT%: 43.7 % (ref 38.4–76.8)
RDW: 18.7 % — ABNORMAL HIGH (ref 11.2–14.5)
lymph#: 2.2 10*3/uL (ref 0.9–3.3)

## 2011-05-03 NOTE — Progress Notes (Signed)
Gave excuse to Dian Situ, Mrs Kilduff's daughter that she accompanied pt to her appt today

## 2011-05-03 NOTE — Progress Notes (Signed)
ONCOLOGY/HEMATOLOGY PROGRESS NOTE  Christina Hopkins  77 y.o. female  PRESENT HISTORY:      HISTORY: Christina Hopkins was seen today for followup of her IgA kappa multiple myeloma. Christina Hopkins is accompanied by her daughter, Christina Hopkins. She was last seen by [place] on 04/05/11. There has not been much change in the patient's overall condition. She takes Revlimid 10 mg 1 week on 1 week off.  She is due to start taking her Revlimid tonight.  Other  treatments consist of Zometa 3.5 mg IV being given every 2 months and last received on 04/05/11, also Aranesp 300 mcg subcutaneous every 2 weeks whenever the hemoglobin is less than 10. Christina Hopkins denies any major changes in her condition. She has some fatigue while taking Revlimid. She is without significant musculoskeletal pain. She has mild chronic diarrhea which is stable. This may be contributing to her magnesium deficiency state.  Christina Hopkins had an episode of bronchitis about November 30th and her symptoms lasted for about 2 weeks. She was treated with antibiotics. She denies any fever during that time.   PROBLEM LIST:  1. Multiple myeloma initially presenting as an IgA kappa monoclonal  gammopathy in February 2009. 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 Bone marrow on 08/31/2007 showed  46% plasma cells. There was 73% plasma cells on repeat bone marrow  08/05/2009. We initiated treatment with Revlimid and Aranesp in April of 2011. The patient also had received vitamin B12 shots for a while, but she is now taking oral vitamin B12. The patient started receiving Zometa in October 2011.  2. Anemia secondary vitamin B12 deficiency.  3. Anemia secondary to iron deficiency.  4. Vitamin D deficiency.  5. Magnesium deficiency.  6. Diabetes mellitus.  7. Hypertension.  8. Dyslipidemia.  9. Coronary artery disease. 10.  Right anterior thigh mass, most likely a  lipoma.  MEDICATIONS:  Reviewed and recorded. The patient is taking vitamin B12  1000 mcg daily, vitamin D at least 1000 units by mouth, Aldactone 25 mg  as needed, magnesium oxide 400 mg 4 times a day, and of course, Revlimid 10 mg daily 1 week on 1 week off.  She believes she received pneumovax a few years ago. A flu shot was received on 01/27/11.  ALLERGIES:   Allergies  Allergen Reactions  . Gemfibrozil Nausea And Vomiting  . Nifedipine Hives     PHYSICAL EXAM:  Vital signs:  BP 107/61  Pulse 76  Temp(Src) 97.7 F (36.5 C) (Oral)  Ht 4\' 10"  (1.473 m)  Wt 116 lb 11.2 oz (52.935 kg)  BMI 24.39 kg/m2  Wt Readings from Last 3 Encounters:  05/03/11 116 lb 11.2 oz (52.935 kg)  04/05/11 117 lb 9.6 oz (53.343 kg)  04/02/11 116 lb 14.4 oz (53.025 kg)    Body surface area is 1.47 meters squared.  Patient is frail and elderly.  Voice is somewhat hoarse. HEENT: There is no scleral icterus. Mouth and pharynx are benign. No peripheral adenopathy palpable. Heart and lungs are normal. The patient has a marked dorsal kyphosis. Abdomen: Benign. Extremities: No peripheral edema. The patient uses a 4-pronged cane which she holds in the right hand.   LABS:    CBC   Lab 05/03/11 1534  WBC 5.5  HGB 10.4*  HCT 31.4*  PLT 158  MCV 95.5  MCH 31.8  MCHC 33.3  RDW 18.7*  LYMPHSABS 2.2  MONOABS 0.7  EOSABS 0.2  BASOSABS 0.0  BANDABS --     Chemistry:   No results found for this basename: NA:5,K:5,CL:5,CO2:5,GLUCOSE:5,BUN:5,CREATININE:5,GFRCGP,:5,CALCIUM:5,MG:5,AST:5,ALT:5,ALKPHOS:5,BILITOT:5,LDH:5 in the last 168 hours  No results found for this basename: INR:5,PROTIME:5 in the last 168 hours  Chemistries, LDH, IgA level, magnesium, uric acid, vitamin B12 level and serum kappa:lambda light chains from today are all pending.   Chemistries from 04/05/2011 were notable for magnesium of 1.4. BUN is 15 creatinine 0.95. IgA level was 940. Prior to starting treatment he IgA  level had been over 2000. Kappa to lambda ratio on 04/05/2011 was 6.52 as compared with 16.60 on 09/22/2009. Vitamin B 12 level on 04/05/2011 784. Vitamin D level on 01/24/2011 was 32. On 11/07/2010, 24-hour urine protein was 40 mg. Urine immunofixation electrophoresis was positive for monoclonal protein. We have a urine kappa lambda ratio of 34.5. In the urine, the immunofixation showed a monoclonal IgA heavy chain with associated kappa light chains.   Imaging studies:  No results found. The most recent metastatic bone survey goes back to 08/05/2009, although we do have other x-rays available.   ASSESSMENT and PLAN:  Christina Hopkins condition remains stable on the current treatment program with Revlimid 10 mg daily, 1 week on and one-week off, Zometa 3.5 mg IV every 2 months and Aranesp 300 mcg subcutaneous every 2 weeks for hemoglobin less than 10.  We'll check a CBC in 2 weeks around January 2 with Aranesp needed. Will plan to see Christina Hopkins again on 05/31/2011 at which time to check the chemistries, LDH, magnesium, vitamin D level, IgA level and serum light chains. Patient will also be due for Zometa 3.5 mg IV.  I am concerned about the rising IgA level. If the IgA level continues to rise, we will give consideration to adding subcutaneous Velcade to the current treatment program.    Samul Dada, MD 05/03/2011 , 6:38 PM

## 2011-05-04 LAB — MAGNESIUM: Magnesium: 1.4 mg/dL — ABNORMAL LOW (ref 1.5–2.5)

## 2011-05-04 LAB — KAPPA/LAMBDA LIGHT CHAINS: Kappa free light chain: 19.4 mg/dL — ABNORMAL HIGH (ref 0.33–1.94)

## 2011-05-04 LAB — URIC ACID: Uric Acid, Serum: 3.8 mg/dL (ref 2.4–7.0)

## 2011-05-04 LAB — LACTATE DEHYDROGENASE: LDH: 127 U/L (ref 94–250)

## 2011-05-13 ENCOUNTER — Other Ambulatory Visit: Payer: Self-pay | Admitting: *Deleted

## 2011-05-13 NOTE — Telephone Encounter (Signed)
Received faxed request to refill flexeril.  Last prescribed in march 2012.   Called pt. And she has had ongoing back pain, possibly from osteoporosis, however she is not sure.  Actually, she is feeling better this afternoon.  Dr. Arline Asp is out of the office today.  Pt. Will contact her PCP on Monday (Dr. Tiburcio Pea) if she wants more flexeril.

## 2011-05-16 ENCOUNTER — Other Ambulatory Visit: Payer: Self-pay | Admitting: Oncology

## 2011-05-18 ENCOUNTER — Ambulatory Visit (HOSPITAL_BASED_OUTPATIENT_CLINIC_OR_DEPARTMENT_OTHER): Payer: Medicare Other

## 2011-05-18 ENCOUNTER — Telehealth: Payer: Self-pay

## 2011-05-18 ENCOUNTER — Other Ambulatory Visit: Payer: Medicare Other

## 2011-05-18 ENCOUNTER — Ambulatory Visit: Payer: Medicare Other

## 2011-05-18 DIAGNOSIS — D649 Anemia, unspecified: Secondary | ICD-10-CM

## 2011-05-18 DIAGNOSIS — C9000 Multiple myeloma not having achieved remission: Secondary | ICD-10-CM

## 2011-05-18 LAB — CBC WITH DIFFERENTIAL/PLATELET
BASO%: 0.8 % (ref 0.0–2.0)
Eosinophils Absolute: 0.2 10*3/uL (ref 0.0–0.5)
HCT: 30.2 % — ABNORMAL LOW (ref 34.8–46.6)
LYMPH%: 45.6 % (ref 14.0–49.7)
MCHC: 32.1 g/dL (ref 31.5–36.0)
MCV: 95.9 fL (ref 79.5–101.0)
MONO#: 0.7 10*3/uL (ref 0.1–0.9)
MONO%: 14.5 % — ABNORMAL HIGH (ref 0.0–14.0)
NEUT%: 35.4 % — ABNORMAL LOW (ref 38.4–76.8)
Platelets: 121 10*3/uL — ABNORMAL LOW (ref 145–400)
WBC: 4.8 10*3/uL (ref 3.9–10.3)

## 2011-05-18 MED ORDER — DARBEPOETIN ALFA-POLYSORBATE 500 MCG/ML IJ SOLN
300.0000 ug | Freq: Once | INTRAMUSCULAR | Status: AC
  Administered 2011-05-18: 300 ug via SUBCUTANEOUS
  Filled 2011-05-18: qty 1

## 2011-05-18 NOTE — Telephone Encounter (Signed)
S/w Christina Hopkins and said that per DSM the pt's labs were good and it was OK to continue with this cycle of Revlimid. Pt started 1 wk on on 05/17/11.

## 2011-05-24 ENCOUNTER — Other Ambulatory Visit: Payer: Self-pay | Admitting: *Deleted

## 2011-05-24 DIAGNOSIS — D630 Anemia in neoplastic disease: Secondary | ICD-10-CM

## 2011-05-24 MED ORDER — LENALIDOMIDE 10 MG PO CAPS: 10.0000 mg | ORAL_CAPSULE | ORAL | Status: DC

## 2011-05-24 NOTE — Telephone Encounter (Signed)
Addended by: Arvilla Meres on: 05/24/2011 03:47 PM   Modules accepted: Orders

## 2011-05-24 NOTE — Telephone Encounter (Signed)
THIS REQUEST WAS GIVEN TO DR.MURINSON'S NURSE, ROBIN BASS,RN. 

## 2011-05-27 ENCOUNTER — Encounter: Payer: Self-pay | Admitting: *Deleted

## 2011-05-27 NOTE — Progress Notes (Signed)
RECEIVED A FAX FROM [facility] CONCERNING A CONFIRMATION OF PRESCRIPTION SHIPMENT FOR REVLIMID. THIS NOTICE WAS GIVEN TO DR.MURINSON'S NURSE, KATHY BUYCK,RN.

## 2011-06-01 ENCOUNTER — Other Ambulatory Visit: Payer: Self-pay | Admitting: Oncology

## 2011-06-01 ENCOUNTER — Other Ambulatory Visit: Payer: Self-pay

## 2011-06-02 ENCOUNTER — Other Ambulatory Visit (HOSPITAL_BASED_OUTPATIENT_CLINIC_OR_DEPARTMENT_OTHER): Payer: Medicare Other | Admitting: Lab

## 2011-06-02 ENCOUNTER — Ambulatory Visit: Payer: Medicare Other

## 2011-06-02 ENCOUNTER — Other Ambulatory Visit: Payer: Self-pay | Admitting: Oncology

## 2011-06-02 ENCOUNTER — Ambulatory Visit (HOSPITAL_BASED_OUTPATIENT_CLINIC_OR_DEPARTMENT_OTHER): Payer: Medicare Other | Admitting: Oncology

## 2011-06-02 ENCOUNTER — Other Ambulatory Visit: Payer: Self-pay | Admitting: Medical Oncology

## 2011-06-02 ENCOUNTER — Telehealth: Payer: Self-pay | Admitting: Oncology

## 2011-06-02 ENCOUNTER — Ambulatory Visit (HOSPITAL_BASED_OUTPATIENT_CLINIC_OR_DEPARTMENT_OTHER): Payer: Medicare Other

## 2011-06-02 ENCOUNTER — Encounter: Payer: Self-pay | Admitting: Oncology

## 2011-06-02 DIAGNOSIS — R197 Diarrhea, unspecified: Secondary | ICD-10-CM

## 2011-06-02 DIAGNOSIS — C9000 Multiple myeloma not having achieved remission: Secondary | ICD-10-CM

## 2011-06-02 DIAGNOSIS — E612 Magnesium deficiency: Secondary | ICD-10-CM

## 2011-06-02 DIAGNOSIS — D649 Anemia, unspecified: Secondary | ICD-10-CM

## 2011-06-02 DIAGNOSIS — D519 Vitamin B12 deficiency anemia, unspecified: Secondary | ICD-10-CM

## 2011-06-02 DIAGNOSIS — E559 Vitamin D deficiency, unspecified: Secondary | ICD-10-CM

## 2011-06-02 LAB — CBC WITH DIFFERENTIAL/PLATELET
Eosinophils Absolute: 0.2 10*3/uL (ref 0.0–0.5)
LYMPH%: 39.5 % (ref 14.0–49.7)
MONO#: 0.7 10*3/uL (ref 0.1–0.9)
NEUT#: 1.7 10*3/uL (ref 1.5–6.5)
Platelets: 111 10*3/uL — ABNORMAL LOW (ref 145–400)
RBC: 3.35 10*6/uL — ABNORMAL LOW (ref 3.70–5.45)
WBC: 4.3 10*3/uL (ref 3.9–10.3)
nRBC: 0 % (ref 0–0)

## 2011-06-02 MED ORDER — DARBEPOETIN ALFA-POLYSORBATE 300 MCG/0.6ML IJ SOLN
300.0000 ug | Freq: Once | INTRAMUSCULAR | Status: DC
  Filled 2011-06-02: qty 1

## 2011-06-02 MED ORDER — ZOLEDRONIC ACID 4 MG/5ML IV CONC
3.5000 mg | Freq: Once | INTRAVENOUS | Status: AC
  Administered 2011-06-02: 3.5 mg via INTRAVENOUS
  Filled 2011-06-02: qty 4.38

## 2011-06-02 MED ORDER — HYDROCODONE-ACETAMINOPHEN 5-500 MG PO TABS: 1.0000 | ORAL_TABLET | Freq: Four times a day (QID) | ORAL | Status: DC | PRN

## 2011-06-02 NOTE — Progress Notes (Signed)
CC:   [name], M.D.  HISTORY:  I saw Demyah Tashanna Dolin today for followup of her IgG kappa multiple myeloma.  Ms. Menge is accompanied by her daughter, [name].  She was last seen by [place] on 05/03/2011.  There has been no significant change in her condition.  She continues to take Revlimid 10 mg 1 week on, 1 week off.  This was started on Tuesday, January 15th.  The patient also receives Zometa 3.5 mg IV every 2 months.  This was last given on 04/05/2011 and is due to be given today.  She also receives Aranesp 300 mcg subcu every 2 weeks whenever the hemoglobin is less than 10.  There have been no major changes in her condition.  She continues to have chronic diarrhea and low back pain.  The bronchitis that she was having has resolved.  PROBLEM LIST: 1. Multiple myeloma, initially presenting as an IgA kappa monoclonal     gammopathy in February 2009.  She had 13q- chromosomal abnormality,     which is felt to be an adverse prognostic determinant.  Bone marrow     08/31/2007 showed 46% plasma cells.  A repeat bone marrow on     08/05/2009 showed 73% plasma cells.  Treatment with Revlimid and     Aranesp were started in April 2011.  Zometa was started in October     2011.  The patient's last metastatic bone survey was on 08/05/2009.     Other x-rays are available.  Maximum IgA level was 2080 on     08/11/2009.  The lowest IgA level following treatment was 489 on     10/05/2010. 2. Anemia secondary to vitamin B12 deficiency.  The patient had     received vitamin B12 shots, but is now on oral vitamin B12. 3. Anemia secondary to iron deficiency. 4. Vitamin D deficiency. 5. Magnesium deficiency. 6. Diabetes mellitus. 7. Hypertension. 8. Dyslipidemia. 9. Coronary artery disease. 10.Right anterior thigh mass, most likely a lipoma.  MEDICATIONS: 1. Aspirin 81 mg daily. 2. Lipitor 40 mg daily. 3. Coreg 50 mg daily. 4. Vitamin D 3000 international units daily. 5. Vitamin B12  1000 mcg daily. 6. Vicodin 5/500 as needed. 7. Revlimid 10 mg daily 1 week on, 1 week off. 8. Lisinopril 10 mg daily. 9. Magnesium oxide 400 mg 4 times a day. 10.Glucophage 500 mg twice a day. 11.Remeron 15 mg at bedtime. 12.Multivitamins daily. 13.Protonix 40 mg daily. 14.Compazine 10 mg as needed. 15.Aldactone 25 mg every other day as needed.  PHYSICAL EXAMINATION:  General:  Ms. Fujita looks well.  Vital Signs: Weight is 118 pounds 6.4 ounces, height 4 feet 10 inches, body surface area 1.48 sq m .  Blood pressure 98/56.  Blood pressure is fairly stable.  Other vital signs are normal.  HEENT:  There is no scleral icterus.  Mouth and pharynx benign.  No peripheral adenopathy palpable. Heart and Lungs:  Normal.  The patient has a marked dorsal kyphosis. Abdomen:  Benign.  Extremities:  No peripheral edema.  The patient uses a 4-prong cane, which she holds in her right hand.  LABORATORY DATA:  Today white count 4.3, ANC 1.7, hemoglobin 10.3, hematocrit 32.2, and platelets 111,000.  Chemistries, LDH, magnesium, vitamin D level, IgA level, and serum for light chains are all pending. Chemistries from 04/05/2011 were normal except for a sodium of 132, magnesium 1.4.  IgA level on 12/18 was 892, as compared with 940 on 04/05/2011.  Vitamin B12  level on 12/18 was 382.  Kappa/lambda ratio on 12/18 was 10.90.  On 11/20 kappa/lambda ratio was 6.52.  On 04/05/2011 vitamin B12 level was 784.  IMAGING STUDIES: 1. Most recent metastatic bone survey was on 08/05/2009.  Prior to     that, we had a metastatic bone survey from 11/03/2008 and     07/23/2007.  The metastatic bone survey from 08/05/2009 stated that     there were no findings to strongly suggest metastatic disease.     There were multiple sites of degenerative change. 2. Chest x-ray from 02/09/2010 showed new or increased T6 and T7     compression fractures compared with the skeletal survey of     08/05/2009. 3. CT scan of head  without IV contrast showed no acute findings.  The     calvarium was intact.  There was a probable old lacunar infarct in     the right basal ganglia. 4. There are x-rays of the left hip and lumbar spine from 07/16/2010.     There are x-rays of the left humerus and cervical spine from     06/30/2010. 5. An ultrasound of the right lower extremity  was carried out and did     not show a discrete mass in the right thigh.  IMPRESSION AND PLAN:  Ms. Hoch condition remains generally stable on the current treatment program, specifically Revlimid 10 mg daily 1 week on, 1 week off, Zometa 3.5 mg IV every 2 months, and Aranesp 300 mcg subcu every 2 weeks for hemoglobin less than 10.  As stated, if the IgA level seems to be rising, then we will consider giving the patient subcutaneous Velcade as part of her treatment program, perhaps every week or 2.  We will plan to check CBCs every 2 weeks and give Aranesp as needed as described above.  The patient and [name] would like to get back to a Tuesday schedule.  We will plan to see Ms. Cho again in 2 months, which will be around March 12, at which time we will check CBC, chemistries, LDH, magnesium, and vitamin D levels, IgA level, and serum light chains.  The patient will also be due for Zometa 3.5 mg IV on that date.    ______________________________ Samul Dada, M.D. DSM/MEDQ  D:  06/02/2011  T:  06/02/2011  Job:  [id number]

## 2011-06-02 NOTE — Telephone Encounter (Signed)
gv pt appt schedule for jan thru march. Per DM ok to move lb/inj appts to Tuesday.

## 2011-06-02 NOTE — Progress Notes (Signed)
This office note has been dictated.  #[id number]

## 2011-06-03 ENCOUNTER — Encounter: Payer: Self-pay | Admitting: Oncology

## 2011-06-03 ENCOUNTER — Other Ambulatory Visit: Payer: Self-pay | Admitting: Oncology

## 2011-06-03 LAB — COMPREHENSIVE METABOLIC PANEL
ALT: 16 U/L (ref 0–35)
Albumin: 4.4 g/dL (ref 3.5–5.2)
CO2: 23 mEq/L (ref 19–32)
Calcium: 8.8 mg/dL (ref 8.4–10.5)
Chloride: 103 mEq/L (ref 96–112)
Creatinine, Ser: 0.95 mg/dL (ref 0.50–1.10)
Potassium: 4.3 mEq/L (ref 3.5–5.3)
Sodium: 137 mEq/L (ref 135–145)
Total Protein: 7.4 g/dL (ref 6.0–8.3)

## 2011-06-03 LAB — KAPPA/LAMBDA LIGHT CHAINS: Lambda Free Lght Chn: 1.5 mg/dL (ref 0.57–2.63)

## 2011-06-03 LAB — LACTATE DEHYDROGENASE: LDH: 150 U/L (ref 94–250)

## 2011-06-03 NOTE — Progress Notes (Signed)
IgA from 05/1711 was 1080--rising.  To start velcade sub q  2.0 mg every 2 weeks, starting on Tuesday 1/29.  To start acyclovir 400mg  2x/day.

## 2011-06-06 ENCOUNTER — Encounter: Payer: Self-pay | Admitting: Medical Oncology

## 2011-06-06 ENCOUNTER — Telehealth: Payer: Self-pay | Admitting: Medical Oncology

## 2011-06-06 NOTE — Telephone Encounter (Signed)
I attempted to call Mary-daughter to let her know about starting velcade. No one available. Will try to call later.

## 2011-06-07 ENCOUNTER — Telehealth: Payer: Self-pay | Admitting: Medical Oncology

## 2011-06-07 DIAGNOSIS — C9000 Multiple myeloma not having achieved remission: Secondary | ICD-10-CM

## 2011-06-07 MED ORDER — ACYCLOVIR 400 MG PO TABS: 400.0000 mg | ORAL_TABLET | Freq: Two times a day (BID) | ORAL | Status: AC

## 2011-06-07 NOTE — Telephone Encounter (Signed)
I called Christina Hopkins daughter to let her know that her mother's protein levels have increased. Dr.Murinson is going to start her on velcade but not until the 29 th. Christina Hopkins is in [place] and will call me to further discuss when she gets back.

## 2011-06-07 NOTE — Telephone Encounter (Signed)
I spoke with pt to inform her protein levels have increased. Dr.Murinson had discussed with patient and daughter [name] about starting velcade if they continues to rise. Dr.Murinson is going to start her on velcade 06/14/11. She already has an appointment for lab and injection. The schedulers should call her with an infusion appointment. I also let her know we will be calling Zovirax 400mg  BID. Pt finishes her revlimid today.She also wanted Dr. Arline Asp to know that she called her primary Dr. Tiburcio Pea and they can not find any record of her getting the pneumonia vaccine. I will let Dr. Arline Asp know and we can give that to her in our office.

## 2011-06-14 ENCOUNTER — Other Ambulatory Visit (HOSPITAL_BASED_OUTPATIENT_CLINIC_OR_DEPARTMENT_OTHER): Payer: Medicare Other | Admitting: Lab

## 2011-06-14 ENCOUNTER — Ambulatory Visit (HOSPITAL_BASED_OUTPATIENT_CLINIC_OR_DEPARTMENT_OTHER): Payer: Medicare Other

## 2011-06-14 DIAGNOSIS — C9000 Multiple myeloma not having achieved remission: Secondary | ICD-10-CM

## 2011-06-14 DIAGNOSIS — Z5112 Encounter for antineoplastic immunotherapy: Secondary | ICD-10-CM

## 2011-06-14 LAB — CBC WITH DIFFERENTIAL/PLATELET
BASO%: 0.6 % (ref 0.0–2.0)
Basophils Absolute: 0 10*3/uL (ref 0.0–0.1)
EOS%: 2 % (ref 0.0–7.0)
HGB: 10.2 g/dL — ABNORMAL LOW (ref 11.6–15.9)
MCH: 31.5 pg (ref 25.1–34.0)
RDW: 17.7 % — ABNORMAL HIGH (ref 11.2–14.5)
lymph#: 2.1 10*3/uL (ref 0.9–3.3)

## 2011-06-14 MED ORDER — BORTEZOMIB CHEMO SQ INJECTION 3.5 MG (2.5MG/ML)
1.3000 mg/m2 | Freq: Once | INTRAMUSCULAR | Status: AC
  Administered 2011-06-14: 2 mg via SUBCUTANEOUS
  Filled 2011-06-14: qty 2

## 2011-06-14 MED ORDER — ONDANSETRON HCL 8 MG PO TABS
8.0000 mg | ORAL_TABLET | Freq: Once | ORAL | Status: AC
  Administered 2011-06-14: 8 mg via ORAL

## 2011-06-15 ENCOUNTER — Telehealth: Payer: Self-pay | Admitting: *Deleted

## 2011-06-15 NOTE — Telephone Encounter (Signed)
Message copied by Augusto Garbe on Wed Jun 15, 2011 10:33 AM ------      Message from: [place], [place] P      Created: Tue Jun 14, 2011  3:35 PM      Regarding: Chemo Follow-up call      Contact: [phone]       1st Velcade SQ  Dr. Arline Asp

## 2011-06-15 NOTE — Telephone Encounter (Signed)
Spoke with patient who says she feels fine.  She has eaten and drinking fluids with no emesis.  Does feel nauseated.  Has compazine on hand but hasn't taken this yet.  Encouraged to take this if nauseated.  Denies pain.  Did experience abdominal cramps but they went away.  Denies constipation or diarrhea as bowels have moved today.  Denies questions so I encouraged her to call if any changes.

## 2011-06-16 ENCOUNTER — Ambulatory Visit: Payer: Medicare Other

## 2011-06-17 ENCOUNTER — Telehealth: Payer: Self-pay | Admitting: Oncology

## 2011-06-17 NOTE — Telephone Encounter (Signed)
Spoke with ms. Christina Hopkins today explained to her about the [employer], she and her daughter will go online and fill out application for co pay assistance, spoke also with her daughter Christina Hopkins and she did assure me that they would do this As soon  As possible.

## 2011-06-22 ENCOUNTER — Telehealth: Payer: Self-pay | Admitting: Medical Oncology

## 2011-06-22 ENCOUNTER — Other Ambulatory Visit: Payer: Self-pay | Admitting: Medical Oncology

## 2011-06-22 DIAGNOSIS — D630 Anemia in neoplastic disease: Secondary | ICD-10-CM

## 2011-06-22 MED ORDER — LENALIDOMIDE 10 MG PO CAPS: 10.0000 mg | ORAL_CAPSULE | ORAL | Status: DC

## 2011-06-22 NOTE — Telephone Encounter (Signed)
Revlimid authorization number [id number]. Faxed to [employer]

## 2011-06-22 NOTE — Telephone Encounter (Signed)
I spoke with Christina Hopkins regarding the assistance for velcade. She was asking does she include her income when it asks for household.  I spoke with [name] in financial counseling and she said to only list the pt's income. Pt is staying with her daughter.. She is going on line to apply and she just does no want to  Interfere with the assistance her mother gets for revlimid as well.

## 2011-06-22 NOTE — Telephone Encounter (Signed)
Christina Hopkins called and left a message stating  she went on line to fill out form for financial assistance for velcade. She has some questions and asked if we could call her back.

## 2011-06-24 ENCOUNTER — Encounter: Payer: Self-pay | Admitting: *Deleted

## 2011-06-24 NOTE — Progress Notes (Signed)
RECEIVED A FAX FROM [employer] CONCERNING A CONFIRMATION OF PRESCRIPTION SHIPMENT FOR REVLIMID. 

## 2011-06-27 ENCOUNTER — Other Ambulatory Visit: Payer: Self-pay | Admitting: Medical Oncology

## 2011-06-27 ENCOUNTER — Encounter: Payer: Self-pay | Admitting: Medical Oncology

## 2011-06-28 ENCOUNTER — Ambulatory Visit (HOSPITAL_BASED_OUTPATIENT_CLINIC_OR_DEPARTMENT_OTHER): Payer: Medicare Other

## 2011-06-28 ENCOUNTER — Other Ambulatory Visit: Payer: Medicare Other | Admitting: Lab

## 2011-06-28 DIAGNOSIS — Z5112 Encounter for antineoplastic immunotherapy: Secondary | ICD-10-CM

## 2011-06-28 DIAGNOSIS — C9000 Multiple myeloma not having achieved remission: Secondary | ICD-10-CM

## 2011-06-28 LAB — CBC WITH DIFFERENTIAL/PLATELET
Basophils Absolute: 0 10*3/uL (ref 0.0–0.1)
Eosinophils Absolute: 0.1 10*3/uL (ref 0.0–0.5)
HGB: 10 g/dL — ABNORMAL LOW (ref 11.6–15.9)
LYMPH%: 37.5 % (ref 14.0–49.7)
MCV: 93.6 fL (ref 79.5–101.0)
MONO#: 1 10*3/uL — ABNORMAL HIGH (ref 0.1–0.9)
NEUT#: 2.5 10*3/uL (ref 1.5–6.5)
Platelets: 121 10*3/uL — ABNORMAL LOW (ref 145–400)
RBC: 3.27 10*6/uL — ABNORMAL LOW (ref 3.70–5.45)
WBC: 5.9 10*3/uL (ref 3.9–10.3)
nRBC: 0 % (ref 0–0)

## 2011-06-28 MED ORDER — BORTEZOMIB CHEMO SQ INJECTION 3.5 MG (2.5MG/ML)
1.3000 mg/m2 | Freq: Once | INTRAMUSCULAR | Status: AC
  Administered 2011-06-28: 2 mg via SUBCUTANEOUS
  Filled 2011-06-28: qty 2

## 2011-06-28 MED ORDER — ONDANSETRON HCL 8 MG PO TABS
8.0000 mg | ORAL_TABLET | Freq: Once | ORAL | Status: AC
  Administered 2011-06-28: 8 mg via ORAL

## 2011-06-28 NOTE — Patient Instructions (Signed)
[facility] Discharge Instructions for Patients Receiving Chemotherapy  Today you received the following chemotherapy agents Velcade  If you develop nausea and vomiting that is not controlled by your nausea medication, call the clinic. If it is after clinic hours your family physician or the after hours number for the clinic or go to the Emergency Department.   BELOW ARE SYMPTOMS THAT SHOULD BE REPORTED IMMEDIATELY:  *FEVER GREATER THAN 100.5 F  *CHILLS WITH OR WITHOUT FEVER  NAUSEA AND VOMITING THAT IS NOT CONTROLLED WITH YOUR NAUSEA MEDICATION  *UNUSUAL SHORTNESS OF BREATH  *UNUSUAL BRUISING OR BLEEDING  TENDERNESS IN MOUTH AND THROAT WITH OR WITHOUT PRESENCE OF ULCERS  *URINARY PROBLEMS  *BOWEL PROBLEMS  UNUSUAL RASH Items with * indicate a potential emergency and should be followed up as soon as possible.   The clinic phone number is [phone].   I have been informed and understand all the instructions given to me. I know to contact the clinic, my physician, or go to the Emergency Department if any problems should occur. I do not have any questions at this time, but understand that I may call the clinic during office hours   should I have any questions or need assistance in obtaining follow up care.    __________________________________________  _____________  __________ Signature of Patient or Authorized Representative            Date                   Time    __________________________________________ Nurse's Signature

## 2011-06-30 ENCOUNTER — Ambulatory Visit: Payer: Medicare Other

## 2011-07-07 ENCOUNTER — Encounter: Payer: Self-pay | Admitting: Oncology

## 2011-07-07 NOTE — Progress Notes (Signed)
Called Christina Hopkins to check if she submitted her income information to [name]. Christina Hopkins stated she faxed her Social Security Award letter and [phone] to [name] on Monday July 04, 2011.

## 2011-07-12 ENCOUNTER — Other Ambulatory Visit: Payer: Medicare Other

## 2011-07-12 ENCOUNTER — Other Ambulatory Visit: Payer: Self-pay

## 2011-07-12 ENCOUNTER — Ambulatory Visit (HOSPITAL_BASED_OUTPATIENT_CLINIC_OR_DEPARTMENT_OTHER): Payer: Medicare Other

## 2011-07-12 ENCOUNTER — Telehealth: Payer: Self-pay

## 2011-07-12 DIAGNOSIS — Z5112 Encounter for antineoplastic immunotherapy: Secondary | ICD-10-CM

## 2011-07-12 DIAGNOSIS — C9000 Multiple myeloma not having achieved remission: Secondary | ICD-10-CM

## 2011-07-12 LAB — CBC WITH DIFFERENTIAL/PLATELET
Basophils Absolute: 0 10*3/uL (ref 0.0–0.1)
HCT: 27.7 % — ABNORMAL LOW (ref 34.8–46.6)
HGB: 8.9 g/dL — ABNORMAL LOW (ref 11.6–15.9)
LYMPH%: 41.6 % (ref 14.0–49.7)
MCH: 30.2 pg (ref 25.1–34.0)
MONO#: 0.8 10*3/uL (ref 0.1–0.9)
NEUT%: 38.4 % (ref 38.4–76.8)
Platelets: 128 10*3/uL — ABNORMAL LOW (ref 145–400)
WBC: 4.7 10*3/uL (ref 3.9–10.3)
lymph#: 2 10*3/uL (ref 0.9–3.3)

## 2011-07-12 MED ORDER — BORTEZOMIB CHEMO SQ INJECTION 3.5 MG (2.5MG/ML)
1.3000 mg/m2 | Freq: Once | INTRAMUSCULAR | Status: AC
  Administered 2011-07-12: 2 mg via SUBCUTANEOUS
  Filled 2011-07-12: qty 2

## 2011-07-12 MED ORDER — DARBEPOETIN ALFA-POLYSORBATE 300 MCG/0.6ML IJ SOLN
300.0000 ug | Freq: Once | INTRAMUSCULAR | Status: AC
  Administered 2011-07-12: 300 ug via SUBCUTANEOUS
  Filled 2011-07-12: qty 0.6

## 2011-07-12 MED ORDER — ONDANSETRON HCL 8 MG PO TABS
8.0000 mg | ORAL_TABLET | Freq: Once | ORAL | Status: AC
  Administered 2011-07-12: 8 mg via ORAL

## 2011-07-12 NOTE — Telephone Encounter (Signed)
S/w pt that Dr Arline Asp said to continue with starting Revlimid tonight. Dr Arline Asp wants her to have an extra CBC drawn next week. Pt expressed understanding. Call forwarded to scheduler

## 2011-07-13 ENCOUNTER — Telehealth: Payer: Self-pay | Admitting: Oncology

## 2011-07-13 NOTE — Telephone Encounter (Signed)
Christina Hopkins l.m to set up 3/5 lab,called and sch for 3/5 at 1:15  aom

## 2011-07-14 ENCOUNTER — Other Ambulatory Visit: Payer: Self-pay

## 2011-07-14 DIAGNOSIS — D630 Anemia in neoplastic disease: Secondary | ICD-10-CM

## 2011-07-14 MED ORDER — LENALIDOMIDE 10 MG PO CAPS: 10.0000 mg | ORAL_CAPSULE | ORAL | Status: DC

## 2011-07-19 ENCOUNTER — Other Ambulatory Visit (HOSPITAL_BASED_OUTPATIENT_CLINIC_OR_DEPARTMENT_OTHER): Payer: Medicare Other | Admitting: Lab

## 2011-07-19 DIAGNOSIS — C9000 Multiple myeloma not having achieved remission: Secondary | ICD-10-CM

## 2011-07-19 LAB — CBC WITH DIFFERENTIAL/PLATELET
BASO%: 0.5 % (ref 0.0–2.0)
Basophils Absolute: 0 10*3/uL (ref 0.0–0.1)
HCT: 28 % — ABNORMAL LOW (ref 34.8–46.6)
HGB: 9.3 g/dL — ABNORMAL LOW (ref 11.6–15.9)
LYMPH%: 42.5 % (ref 14.0–49.7)
MCHC: 33 g/dL (ref 31.5–36.0)
MONO#: 0.2 10*3/uL (ref 0.1–0.9)
NEUT%: 47.7 % (ref 38.4–76.8)
Platelets: 84 10*3/uL — ABNORMAL LOW (ref 145–400)
WBC: 3.4 10*3/uL — ABNORMAL LOW (ref 3.9–10.3)

## 2011-07-21 ENCOUNTER — Encounter: Payer: Self-pay | Admitting: *Deleted

## 2011-07-21 NOTE — Progress Notes (Signed)
RECEIVED A FAX FROM [employer] CONCERNING A CONFIRMATION OF PRESCRIPTION SHIPMENT. 

## 2011-07-26 ENCOUNTER — Encounter: Payer: Self-pay | Admitting: Oncology

## 2011-07-26 ENCOUNTER — Other Ambulatory Visit (HOSPITAL_BASED_OUTPATIENT_CLINIC_OR_DEPARTMENT_OTHER): Payer: Medicare Other | Admitting: Lab

## 2011-07-26 ENCOUNTER — Other Ambulatory Visit: Payer: Self-pay | Admitting: Oncology

## 2011-07-26 ENCOUNTER — Ambulatory Visit (HOSPITAL_BASED_OUTPATIENT_CLINIC_OR_DEPARTMENT_OTHER): Payer: Medicare Other | Admitting: Oncology

## 2011-07-26 ENCOUNTER — Ambulatory Visit (HOSPITAL_BASED_OUTPATIENT_CLINIC_OR_DEPARTMENT_OTHER): Payer: Medicare Other

## 2011-07-26 ENCOUNTER — Ambulatory Visit: Payer: Medicare Other

## 2011-07-26 DIAGNOSIS — E559 Vitamin D deficiency, unspecified: Secondary | ICD-10-CM

## 2011-07-26 DIAGNOSIS — D518 Other vitamin B12 deficiency anemias: Secondary | ICD-10-CM

## 2011-07-26 DIAGNOSIS — E612 Magnesium deficiency: Secondary | ICD-10-CM

## 2011-07-26 DIAGNOSIS — D509 Iron deficiency anemia, unspecified: Secondary | ICD-10-CM

## 2011-07-26 DIAGNOSIS — Z5112 Encounter for antineoplastic immunotherapy: Secondary | ICD-10-CM

## 2011-07-26 DIAGNOSIS — R634 Abnormal weight loss: Secondary | ICD-10-CM

## 2011-07-26 DIAGNOSIS — C9000 Multiple myeloma not having achieved remission: Secondary | ICD-10-CM

## 2011-07-26 DIAGNOSIS — D649 Anemia, unspecified: Secondary | ICD-10-CM

## 2011-07-26 DIAGNOSIS — D519 Vitamin B12 deficiency anemia, unspecified: Secondary | ICD-10-CM

## 2011-07-26 LAB — CBC WITH DIFFERENTIAL/PLATELET
BASO%: 0.2 % (ref 0.0–2.0)
EOS%: 1.4 % (ref 0.0–7.0)
HCT: 29.4 % — ABNORMAL LOW (ref 34.8–46.6)
LYMPH%: 38.5 % (ref 14.0–49.7)
MCH: 32.5 pg (ref 25.1–34.0)
MCHC: 33.3 g/dL (ref 31.5–36.0)
MCV: 97.5 fL (ref 79.5–101.0)
MONO%: 14.3 % — ABNORMAL HIGH (ref 0.0–14.0)
NEUT%: 45.6 % (ref 38.4–76.8)
Platelets: 140 10*3/uL — ABNORMAL LOW (ref 145–400)
lymph#: 1.7 10*3/uL (ref 0.9–3.3)

## 2011-07-26 MED ORDER — ONDANSETRON HCL 8 MG PO TABS
8.0000 mg | ORAL_TABLET | Freq: Once | ORAL | Status: AC
  Administered 2011-07-26: 8 mg via ORAL

## 2011-07-26 MED ORDER — ZOLEDRONIC ACID 4 MG/5ML IV CONC
3.5000 mg | Freq: Once | INTRAVENOUS | Status: AC
  Administered 2011-07-26: 3.5 mg via INTRAVENOUS
  Filled 2011-07-26: qty 4.38

## 2011-07-26 MED ORDER — DARBEPOETIN ALFA-POLYSORBATE 300 MCG/0.6ML IJ SOLN
300.0000 ug | Freq: Once | INTRAMUSCULAR | Status: AC
  Administered 2011-07-26: 300 ug via SUBCUTANEOUS
  Filled 2011-07-26: qty 0.6

## 2011-07-26 MED ORDER — BORTEZOMIB CHEMO SQ INJECTION 3.5 MG (2.5MG/ML)
1.3000 mg/m2 | Freq: Once | INTRAMUSCULAR | Status: AC
  Administered 2011-07-26: 2 mg via SUBCUTANEOUS
  Filled 2011-07-26: qty 0.8

## 2011-07-26 NOTE — Progress Notes (Signed)
This office note has been dictated.  #[id number]

## 2011-07-26 NOTE — Progress Notes (Signed)
CC:   Christina Hopkins, M.D.  PROBLEM LIST: 1. Multiple myeloma, initially presenting as an IgA kappa monoclonal     gammopathy in February 2009.  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  This was felt to be an adverse prognostic     determinant.  Bone marrow on 08/31/2007 showed 46% plasma cells.  A     repeat bone marrow on 08/05/2009 showed 73% plasma cells.     Treatment with Revlimid was started in April 2011.  Zometa was     started in October 2011.  We had previously started Aranesp for the     patient's anemia in May of 2010.  Most recent metastatic bone     survey was on 08/05/2009.  Other x-rays since then are available.     Maximum IgA level was 2080 on 08/11/2009.  The lowest IgA level     following treatment was 489 on 10/05/2010. 2. Anemia secondary to vitamin B12 deficiency. The patient had  received vitamin B12 shots, but is now on oral vitamin B12.  3. Anemia secondary to iron deficiency.  4. Vitamin D deficiency.  5. Magnesium deficiency.  6. Diabetes mellitus.  7. Hypertension.  8. Dyslipidemia.  9. Coronary artery disease.  10.Right anterior thigh mass, most likely a lipoma.    MEDICATIONS:  1. Aspirin 81 mg daily.  2. Lipitor 40 mg daily.  3. Coreg 50 mg daily.  4. Vitamin D 3000 international units daily.  5. Vitamin B12 1000 mcg daily.  6. Vicodin 5/500 as needed.  7. Revlimid 10 mg daily 1 week on, 1 week off.  8. Lisinopril 10 mg daily.  9. Magnesium oxide 400 mg 4 times a day.  10.Glucophage 500 mg twice a day.  11.Remeron 15 mg at bedtime.  12.Multivitamins daily.  13.Protonix 40 mg daily.  14.Compazine 10 mg as needed.  15.Aldactone 25 mg every other day as needed.   HISTORY:  Christina Hopkins was seen today for followup of her IgG kappa multiple myeloma.  Ms. Christina Hopkins was accompanied by her daughter, Christina Hopkins.  She was last seen by [place] on 06/02/2011.  Following that visit the patient's IgA level came back at 1080.  The IgA level has  progressively risen.  It was at that point that we decided to add Velcade subcutaneously to the patient's treatment program which consists of Revlimid p.o. 10 mg daily 1 week on/1 week off, Zometa given approximately every 2 months in a slightly modified dose of 3.5 mg and Aranesp 300 mcg subcu given every 2 weeks for hemoglobin less than or equal to 10.  Ms. Christina Hopkins received her first dose of Velcade at a dose of 1.3 mg per meter squared equal to 2 mg on 01/29 with subsequent doses on 02/12, 02/26.  These are being given every 2 weeks.  The patient seems to be tolerating her treatments well.  There is a little bit of fatigue and decreased energy.  The patient thinks she may be having some nausea which she relates to starting the acyclovir with her Velcade treatments.  Aside from this, there have been no significant changes.  The patient has no new musculoskeletal pains.  In general, she seems to be tolerating the treatment program well.  She is due to start her Revlimid today for the next cycle which is 2 weeks i.e., 1 week on/1 week off.  PHYSICAL EXAMINATION:  There is little change.  Weight is 119.6 pounds, height  4 feet 10 inches.  Body surface area 1.49 meter squared.  Blood pressure 112/59.  Other vital signs are normal.  There is thinning of the scalp hair.  No scleral icterus.  Mouth and pharynx are benign.  No peripheral adenopathy palpable.  Lungs:  There were some bibasilar rales.  Cardiac:  Regular rhythm without murmur or rub.  The patient has a marked dorsal kyphosis.  Abdomen:  Benign.  Extremities:  No peripheral edema.  The patient uses a 4 pronged cane which she holds in the right hand.  LABORATORY DATA:  Today, white count 4.5, ANC 2.1, hemoglobin 9.8, hematocrit 29.4, platelets 140,000.  On 03/05 platelet count was 84,000 and hemoglobin 9.3, ANC 1.6.  White count 3.4.  On 02/26 the hemoglobin was 8.9, hematocrit 27.7, platelets 128,000, white count 4.7, ANC  1.8. Chemistries, LDH, magnesium, IgA, kappa lambda ratio, vitamin D level and vitamin B12 level are all pending today.  Chemistries from 06/02/2011 notable for BUN of 13, creatinine 0.95, glucose slightly elevated at 116, magnesium slightly low at 1.4.  Albumin was 4.4, liver function tests were normal including an LDH of 150.  IgA level was 1080. Kappa lambda ratio was 10.33.  Vitamin D level was 32 on 01/17.  Vitamin B12 level was 382 on 05/03/2011.   IMAGING STUDIES:  1. Most recent metastatic bone survey was on 08/05/2009. Prior to  that, we had a metastatic bone survey from 11/03/2008 and  07/23/2007. The metastatic bone survey from 08/05/2009 stated that  there were no findings to strongly suggest metastatic disease.  There were multiple sites of degenerative change.  2. Chest x-ray from 02/09/2010 showed new or increased T6 and T7  compression fractures compared with the skeletal survey of  08/05/2009.  3. CT scan of head without IV contrast showed no acute findings. The  calvarium was intact. There was a probable old lacunar infarct in  the right basal ganglia.  4. There are x-rays of the left hip and lumbar spine from 07/16/2010.  There are x-rays of the left humerus and cervical spine from  06/30/2010.  5. An ultrasound of the right lower extremity was carried out and did  not show a discrete mass in the right thigh.    IMPRESSION AND PLAN:  Ms. Christina Hopkins condition remains stable.  We await with interest today's IgA level to see if the addition of Velcade has been beneficial.  Today the patient will start her next cycle of Revlimid 10 mg daily 1 week on/1 week off.  She will receive Zometa 3.5 mg IV, and we will continue this every 2 months.  The patient will also receive subcutaneous Velcade 2.0 mg today and this will be continued every 2 weeks.  The patient will receive Aranesp today 300 mcg subcu in view of her hemoglobin that is 9.8.  Ms. Christina Hopkins will come in every  2 weeks for CBC and Velcade 2 mg subcu and Aranesp 300 mcg subcu if the hemoglobin is less than or equal to 10. Every 2 weeks she will also start her Revlimid at 10 mg daily.  We will plan to see Ms. Christina Hopkins again in 8 weeks which will be around May 7, at which time we will check CBC, chemistries, LDH, magnesium IgA level, serum kappa lambda ratio, vitamin B12 level and vitamin D level.    ______________________________ Samul Dada, M.D. DSM/MEDQ  D:  07/26/2011  T:  07/26/2011  Job:  [id number]

## 2011-07-26 NOTE — Progress Notes (Signed)
Patient was seen today.  She has been taking oral vitamin B12 daily for a few months.  Vitamin B12 level have been gradually dropping, today 284.  I have ordered vitamin B12 shots for her--3/26, 4/23 and 5/21.

## 2011-07-26 NOTE — Patient Instructions (Signed)
Call if any problems. Pt and daughter aware of next appt

## 2011-07-27 ENCOUNTER — Telehealth: Payer: Self-pay | Admitting: Medical Oncology

## 2011-07-27 ENCOUNTER — Encounter: Payer: Self-pay | Admitting: Medical Oncology

## 2011-07-27 LAB — COMPREHENSIVE METABOLIC PANEL
ALT: 12 U/L (ref 0–35)
AST: 16 U/L (ref 0–37)
Alkaline Phosphatase: 42 U/L (ref 39–117)
BUN: 14 mg/dL (ref 6–23)
Creatinine, Ser: 1.05 mg/dL (ref 0.50–1.10)
Total Bilirubin: 0.4 mg/dL (ref 0.3–1.2)

## 2011-07-27 LAB — LACTATE DEHYDROGENASE: LDH: 122 U/L (ref 94–250)

## 2011-07-27 LAB — VITAMIN B12: Vitamin B-12: 284 pg/mL (ref 211–911)

## 2011-07-27 LAB — KAPPA/LAMBDA LIGHT CHAINS
Kappa free light chain: 11.6 mg/dL — ABNORMAL HIGH (ref 0.33–1.94)
Lambda Free Lght Chn: 1.17 mg/dL (ref 0.57–2.63)

## 2011-07-27 NOTE — Telephone Encounter (Signed)
I called pt per Dr. Arline Asp to let her know that her Vit B 12 level is low. He is going to give her Vit B 12 injections with her treatment 3/26,4/23 and 5/21. I also informed her that her IgA level dropped from 1080 to 695. I explained that Dr. Arline Asp is very pleased and pt was happy also.

## 2011-07-27 NOTE — Progress Notes (Signed)
Labs from 07/26/11 visit faxed to Dr. Leron Croak  fx [phone] per pt request

## 2011-08-09 ENCOUNTER — Other Ambulatory Visit: Payer: Medicare Other | Admitting: Lab

## 2011-08-09 ENCOUNTER — Ambulatory Visit (HOSPITAL_BASED_OUTPATIENT_CLINIC_OR_DEPARTMENT_OTHER): Payer: Medicare Other

## 2011-08-09 DIAGNOSIS — Z5112 Encounter for antineoplastic immunotherapy: Secondary | ICD-10-CM

## 2011-08-09 DIAGNOSIS — C9000 Multiple myeloma not having achieved remission: Secondary | ICD-10-CM

## 2011-08-09 LAB — CBC WITH DIFFERENTIAL/PLATELET
BASO%: 0.4 % (ref 0.0–2.0)
EOS%: 1.8 % (ref 0.0–7.0)
HCT: 31.6 % — ABNORMAL LOW (ref 34.8–46.6)
LYMPH%: 41.1 % (ref 14.0–49.7)
MCH: 32.8 pg (ref 25.1–34.0)
MCHC: 33 g/dL (ref 31.5–36.0)
MCV: 99.5 fL (ref 79.5–101.0)
MONO%: 14.2 % — ABNORMAL HIGH (ref 0.0–14.0)
NEUT%: 42.5 % (ref 38.4–76.8)
lymph#: 2 10*3/uL (ref 0.9–3.3)

## 2011-08-09 MED ORDER — BORTEZOMIB CHEMO SQ INJECTION 3.5 MG (2.5MG/ML)
1.3000 mg/m2 | Freq: Once | INTRAMUSCULAR | Status: AC
  Administered 2011-08-09: 2 mg via SUBCUTANEOUS
  Filled 2011-08-09: qty 2

## 2011-08-09 MED ORDER — ONDANSETRON HCL 8 MG PO TABS
8.0000 mg | ORAL_TABLET | Freq: Once | ORAL | Status: AC
  Administered 2011-08-09: 8 mg via ORAL

## 2011-08-10 ENCOUNTER — Other Ambulatory Visit: Payer: Self-pay | Admitting: *Deleted

## 2011-08-10 DIAGNOSIS — D630 Anemia in neoplastic disease: Secondary | ICD-10-CM

## 2011-08-10 MED ORDER — LENALIDOMIDE 10 MG PO CAPS: 10.0000 mg | ORAL_CAPSULE | ORAL | Status: DC

## 2011-08-11 ENCOUNTER — Encounter: Payer: Self-pay | Admitting: *Deleted

## 2011-08-11 NOTE — Progress Notes (Signed)
RECEIVED A FAX FROM [facility] CONCERNING A CONFIRMATION OF FACSIMILE.

## 2011-08-17 ENCOUNTER — Other Ambulatory Visit: Payer: Self-pay | Admitting: *Deleted

## 2011-08-22 ENCOUNTER — Telehealth: Payer: Self-pay | Admitting: Medical Oncology

## 2011-08-22 ENCOUNTER — Other Ambulatory Visit: Payer: Self-pay | Admitting: Medical Oncology

## 2011-08-22 NOTE — Telephone Encounter (Signed)
I left Christina Hopkins a message that Dr. Arline Asp would like to get a MRI of her Mother's lumbar spine to evaluate the pain. He feels the MRI will be a better study than just basic xray studies. We will set this up and call her with appointments.

## 2011-08-22 NOTE — Telephone Encounter (Signed)
Christina Hopkins-daughter called stating that her mother has been having low back pain over the week-end. She has been taking her flexeril and her vicodin for pain but nothing seems to help. She did apply cold. She is not sure if Dr. Arline Asp would like to get some x-ray studies to evaluate. I will speak with Dr. Arline Asp and call her back.

## 2011-08-23 ENCOUNTER — Encounter: Payer: Self-pay | Admitting: Medical Oncology

## 2011-08-23 ENCOUNTER — Telehealth: Payer: Self-pay | Admitting: Oncology

## 2011-08-23 ENCOUNTER — Ambulatory Visit (HOSPITAL_BASED_OUTPATIENT_CLINIC_OR_DEPARTMENT_OTHER): Payer: Medicare Other

## 2011-08-23 ENCOUNTER — Other Ambulatory Visit (HOSPITAL_BASED_OUTPATIENT_CLINIC_OR_DEPARTMENT_OTHER): Payer: Medicare Other

## 2011-08-23 DIAGNOSIS — C9000 Multiple myeloma not having achieved remission: Secondary | ICD-10-CM

## 2011-08-23 DIAGNOSIS — D509 Iron deficiency anemia, unspecified: Secondary | ICD-10-CM

## 2011-08-23 DIAGNOSIS — Z5112 Encounter for antineoplastic immunotherapy: Secondary | ICD-10-CM

## 2011-08-23 DIAGNOSIS — D518 Other vitamin B12 deficiency anemias: Secondary | ICD-10-CM

## 2011-08-23 LAB — CBC WITH DIFFERENTIAL/PLATELET
BASO%: 0.6 % (ref 0.0–2.0)
Basophils Absolute: 0 10*3/uL (ref 0.0–0.1)
EOS%: 1.1 % (ref 0.0–7.0)
HCT: 29.9 % — ABNORMAL LOW (ref 34.8–46.6)
HGB: 9.8 g/dL — ABNORMAL LOW (ref 11.6–15.9)
LYMPH%: 30 % (ref 14.0–49.7)
MCH: 31.4 pg (ref 25.1–34.0)
MCHC: 32.8 g/dL (ref 31.5–36.0)
MCV: 95.8 fL (ref 79.5–101.0)
MONO%: 17.5 % — ABNORMAL HIGH (ref 0.0–14.0)
NEUT%: 50.8 % (ref 38.4–76.8)
lymph#: 2 10*3/uL (ref 0.9–3.3)

## 2011-08-23 MED ORDER — ONDANSETRON HCL 8 MG PO TABS
8.0000 mg | ORAL_TABLET | Freq: Once | ORAL | Status: AC
  Administered 2011-08-23: 8 mg via ORAL

## 2011-08-23 MED ORDER — DARBEPOETIN ALFA-POLYSORBATE 500 MCG/ML IJ SOLN
300.0000 ug | Freq: Once | INTRAMUSCULAR | Status: AC
  Administered 2011-08-23: 300 ug via SUBCUTANEOUS
  Filled 2011-08-23: qty 1

## 2011-08-23 MED ORDER — BORTEZOMIB CHEMO SQ INJECTION 3.5 MG (2.5MG/ML)
1.3000 mg/m2 | Freq: Once | INTRAMUSCULAR | Status: AC
  Administered 2011-08-23: 2 mg via SUBCUTANEOUS
  Filled 2011-08-23: qty 2

## 2011-08-23 NOTE — Telephone Encounter (Signed)
S/w the pt's dtr mary and she is aware of the mri appt on 08/27/2011@1 :45pm at [facility]

## 2011-08-25 ENCOUNTER — Other Ambulatory Visit: Payer: Self-pay | Admitting: Medical Oncology

## 2011-08-25 ENCOUNTER — Encounter: Payer: Self-pay | Admitting: Medical Oncology

## 2011-08-25 MED ORDER — CYCLOBENZAPRINE HCL 10 MG PO TABS: 10.0000 mg | ORAL_TABLET | Freq: Three times a day (TID) | ORAL | Status: DC | PRN

## 2011-08-27 ENCOUNTER — Ambulatory Visit (HOSPITAL_COMMUNITY)
Admission: RE | Admit: 2011-08-27 | Discharge: 2011-08-27 | Disposition: A | Discharge status: Home / Self Care | Payer: Medicare Other | Source: Ambulatory Visit | Attending: Oncology | Admitting: Oncology

## 2011-08-27 ENCOUNTER — Other Ambulatory Visit: Payer: Self-pay | Admitting: Oncology

## 2011-08-27 DIAGNOSIS — M8448XA Pathological fracture, other site, initial encounter for fracture: Secondary | ICD-10-CM | POA: Insufficient documentation

## 2011-08-27 DIAGNOSIS — M47817 Spondylosis without myelopathy or radiculopathy, lumbosacral region: Secondary | ICD-10-CM | POA: Insufficient documentation

## 2011-08-27 DIAGNOSIS — S32009A Unspecified fracture of unspecified lumbar vertebra, initial encounter for closed fracture: Secondary | ICD-10-CM | POA: Insufficient documentation

## 2011-08-27 DIAGNOSIS — X58XXXA Exposure to other specified factors, initial encounter: Secondary | ICD-10-CM | POA: Insufficient documentation

## 2011-08-27 DIAGNOSIS — M48061 Spinal stenosis, lumbar region without neurogenic claudication: Secondary | ICD-10-CM | POA: Insufficient documentation

## 2011-08-27 DIAGNOSIS — M40299 Other kyphosis, site unspecified: Secondary | ICD-10-CM | POA: Insufficient documentation

## 2011-08-27 DIAGNOSIS — C9000 Multiple myeloma not having achieved remission: Secondary | ICD-10-CM | POA: Insufficient documentation

## 2011-08-27 DIAGNOSIS — S22009A Unspecified fracture of unspecified thoracic vertebra, initial encounter for closed fracture: Secondary | ICD-10-CM | POA: Insufficient documentation

## 2011-08-27 MED ORDER — GADOBENATE DIMEGLUMINE 529 MG/ML IV SOLN
10.0000 mL | Freq: Once | INTRAVENOUS | Status: AC | PRN
  Administered 2011-08-27: 10 mL via INTRAVENOUS

## 2011-08-31 ENCOUNTER — Other Ambulatory Visit: Payer: Self-pay | Admitting: Medical Oncology

## 2011-08-31 ENCOUNTER — Telehealth: Payer: Self-pay | Admitting: Medical Oncology

## 2011-08-31 NOTE — Telephone Encounter (Signed)
I spoke with Christina Hopkins-daughter per Dr. Arline Asp to let her know that the MRI shows compression fractures L5 and T 11 and T12. Dr. Arline Asp is going to refer her to Dr. Corliss Skains in IR to see if she is a candidate for a vertebroplasty. She is aware that our office will set this up and call her with appointment.

## 2011-09-01 ENCOUNTER — Other Ambulatory Visit: Payer: Self-pay

## 2011-09-05 ENCOUNTER — Telehealth (HOSPITAL_COMMUNITY): Payer: Self-pay

## 2011-09-05 NOTE — Telephone Encounter (Signed)
I spoke with Mrs. Devine and her daughter to set up her consult appt with Dr. Corliss Skains for Thursday 25th/

## 2011-09-06 ENCOUNTER — Ambulatory Visit (HOSPITAL_BASED_OUTPATIENT_CLINIC_OR_DEPARTMENT_OTHER): Payer: Medicare Other

## 2011-09-06 ENCOUNTER — Other Ambulatory Visit (HOSPITAL_BASED_OUTPATIENT_CLINIC_OR_DEPARTMENT_OTHER): Payer: Medicare Other | Admitting: Lab

## 2011-09-06 DIAGNOSIS — D509 Iron deficiency anemia, unspecified: Secondary | ICD-10-CM

## 2011-09-06 DIAGNOSIS — D518 Other vitamin B12 deficiency anemias: Secondary | ICD-10-CM

## 2011-09-06 DIAGNOSIS — C9000 Multiple myeloma not having achieved remission: Secondary | ICD-10-CM

## 2011-09-06 DIAGNOSIS — D519 Vitamin B12 deficiency anemia, unspecified: Secondary | ICD-10-CM

## 2011-09-06 LAB — CBC WITH DIFFERENTIAL/PLATELET
BASO%: 0.4 % (ref 0.0–2.0)
Basophils Absolute: 0 10*3/uL (ref 0.0–0.1)
EOS%: 1 % (ref 0.0–7.0)
MCH: 31.5 pg (ref 25.1–34.0)
MCHC: 32.8 g/dL (ref 31.5–36.0)
MCV: 96.1 fL (ref 79.5–101.0)
MONO%: 17 % — ABNORMAL HIGH (ref 0.0–14.0)
NEUT%: 63 % (ref 38.4–76.8)
RDW: 17.4 % — ABNORMAL HIGH (ref 11.2–14.5)
lymph#: 1.5 10*3/uL (ref 0.9–3.3)

## 2011-09-06 MED ORDER — BORTEZOMIB CHEMO SQ INJECTION 3.5 MG (2.5MG/ML)
1.3000 mg/m2 | Freq: Once | INTRAMUSCULAR | Status: AC
  Administered 2011-09-06: 2 mg via SUBCUTANEOUS
  Filled 2011-09-06: qty 2

## 2011-09-06 MED ORDER — CYANOCOBALAMIN 1000 MCG/ML IJ SOLN
1000.0000 ug | Freq: Once | INTRAMUSCULAR | Status: AC
  Administered 2011-09-06: 1000 ug via INTRAMUSCULAR

## 2011-09-06 MED ORDER — ONDANSETRON HCL 8 MG PO TABS
8.0000 mg | ORAL_TABLET | Freq: Once | ORAL | Status: AC
  Administered 2011-09-06: 8 mg via ORAL

## 2011-09-08 ENCOUNTER — Ambulatory Visit (HOSPITAL_COMMUNITY)
Admission: RE | Admit: 2011-09-08 | Discharge: 2011-09-08 | Disposition: A | Discharge status: Home / Self Care | Payer: Medicare Other | Source: Ambulatory Visit | Attending: Oncology | Admitting: Oncology

## 2011-09-14 ENCOUNTER — Other Ambulatory Visit: Payer: Self-pay | Admitting: *Deleted

## 2011-09-14 DIAGNOSIS — D630 Anemia in neoplastic disease: Secondary | ICD-10-CM

## 2011-09-14 MED ORDER — LENALIDOMIDE 10 MG PO CAPS: 10.0000 mg | ORAL_CAPSULE | ORAL | Status: DC

## 2011-09-14 NOTE — Telephone Encounter (Signed)
Called pt. And requested that she call [employer] and do the patient survey.  Gave her the [employer] phone number.

## 2011-09-14 NOTE — Telephone Encounter (Signed)
[facility] faxed confirmation of facsimile receipt for revlimid refill.

## 2011-09-20 ENCOUNTER — Ambulatory Visit: Payer: Medicare Other | Admitting: Oncology

## 2011-09-20 ENCOUNTER — Ambulatory Visit (HOSPITAL_BASED_OUTPATIENT_CLINIC_OR_DEPARTMENT_OTHER): Payer: Medicare Other

## 2011-09-20 ENCOUNTER — Other Ambulatory Visit (HOSPITAL_BASED_OUTPATIENT_CLINIC_OR_DEPARTMENT_OTHER): Payer: Medicare Other | Admitting: Lab

## 2011-09-20 ENCOUNTER — Encounter: Payer: Self-pay | Admitting: Oncology

## 2011-09-20 DIAGNOSIS — D519 Vitamin B12 deficiency anemia, unspecified: Secondary | ICD-10-CM

## 2011-09-20 DIAGNOSIS — E612 Magnesium deficiency: Secondary | ICD-10-CM

## 2011-09-20 DIAGNOSIS — E559 Vitamin D deficiency, unspecified: Secondary | ICD-10-CM

## 2011-09-20 DIAGNOSIS — Z5112 Encounter for antineoplastic immunotherapy: Secondary | ICD-10-CM

## 2011-09-20 DIAGNOSIS — C9000 Multiple myeloma not having achieved remission: Secondary | ICD-10-CM

## 2011-09-20 DIAGNOSIS — D518 Other vitamin B12 deficiency anemias: Secondary | ICD-10-CM

## 2011-09-20 LAB — CBC WITH DIFFERENTIAL/PLATELET
BASO%: 0.7 % (ref 0.0–2.0)
Eosinophils Absolute: 0.1 10*3/uL (ref 0.0–0.5)
LYMPH%: 27.4 % (ref 14.0–49.7)
MCHC: 32.7 g/dL (ref 31.5–36.0)
MCV: 98.3 fL (ref 79.5–101.0)
MONO%: 10.4 % (ref 0.0–14.0)
NEUT#: 3.1 10*3/uL (ref 1.5–6.5)
Platelets: 145 10*3/uL (ref 145–400)
RBC: 3.43 10*6/uL — ABNORMAL LOW (ref 3.70–5.45)
RDW: 18.5 % — ABNORMAL HIGH (ref 11.2–14.5)
WBC: 5.1 10*3/uL (ref 3.9–10.3)
nRBC: 0 % (ref 0–0)

## 2011-09-20 MED ORDER — ONDANSETRON HCL 8 MG PO TABS
8.0000 mg | ORAL_TABLET | Freq: Once | ORAL | Status: AC
  Administered 2011-09-20: 8 mg via ORAL

## 2011-09-20 MED ORDER — ZOLEDRONIC ACID 4 MG/5ML IV CONC
3.5000 mg | Freq: Once | INTRAVENOUS | Status: AC
  Administered 2011-09-20: 3.5 mg via INTRAVENOUS
  Filled 2011-09-20: qty 4.38

## 2011-09-20 MED ORDER — BORTEZOMIB CHEMO SQ INJECTION 3.5 MG (2.5MG/ML)
1.3000 mg/m2 | Freq: Once | INTRAMUSCULAR | Status: AC
  Administered 2011-09-20: 2 mg via SUBCUTANEOUS
  Filled 2011-09-20: qty 2

## 2011-09-20 NOTE — Progress Notes (Signed)
CC:   [name], M.D.   PROBLEM LIST:  1. Multiple myeloma, initially presenting as an IgA kappa monoclonal  gammopathy in February 2009. 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 This was felt to be an adverse prognostic  determinant. Bone marrow on 08/31/2007 showed 46% plasma cells. A  repeat bone marrow on 08/05/2009 showed 73% plasma cells.  Treatment with Revlimid was started in April 2011. Zometa was  started in October 2011. We had previously started Aranesp for the  patient's anemia in May of 2010. Most recent metastatic bone  survey was on 08/05/2009. Other x-rays since then are available.  Maximum IgA level was 2080 on 08/11/2009. The lowest IgA level  following treatment was 489 on 10/05/2010. Subcutaneous Velcade was added to the patient's treatment program on 06/14/2011 because of her rising IgA level 1080 on 06/02/2011.  The patient is receiving Velcade every 2 weeks in combination with Revlimid being given 10 mg 1 week on 1 week off, Zometa 3.5 mg IV every 2 months and Aranesp 300 mcg subcu every 2 weeks for hemoglobin less than or equal to 10.    2. Anemia secondary to vitamin B12 deficiency.   The patient had been on oral vitamin B12.  However, her vitamin B12 level fell to 284 on 07/26/2011 and vitamin B12 shots 1000 mcg given IM every month was resumed on 09/06/2011. 3. Anemia secondary to iron deficiency.  4. Vitamin D deficiency.  5. Magnesium deficiency.  6. Diabetes mellitus.  7. Hypertension.  8. Dyslipidemia.  9. Coronary artery disease.  10.Right anterior thigh mass, most likely a lipoma.  11.Multiple vertebral compression fractures as seen on MRI of the lumbar spine on 08/27/2011 with vertebroplasty planned for May 24.   MEDICATIONS:  1. Aspirin 81 mg daily.  2. Lipitor 40 mg daily.  3. Coreg 50 mg daily.  4. Vitamin D 3000 international units daily.  5. Vitamin B12 1000 mcg IM monthly, restarted on 09/06/11. 6. Vicodin 5/500  as needed.  7. Acyclovir 400 mg p.o. b.i.d..  8. Lisinopril 10 mg daily.  9. Magnesium oxide 400 mg 4 times a day.  10.Glucophage 500 mg twice a day.  11.Remeron 15 mg at bedtime.  12.Multivitamins daily.  13.Protonix 40 mg daily.  14.Compazine 10 mg as needed.  15.Aldactone 25 mg every other day as needed 16.Revlimid 10 mg daily 1 week on, 1 week off 17.Velcade 2 mg subcu every 2 weeks started on 06/14/2011, on     09/20/2011 the patient will be receiving her eighth Velcade     injection. 18.Aranesp 300 mcg subcu every 2 weeks for hemoglobin less than or     equal to 10. 19.Zometa 3.5 mg IV every 2 months.   HISTORY:  I saw Christina Hopkins today for followup of her IgG kappa multiple myeloma.  [name] was accompanied by her daughter, Christina Hopkins.  She was last seen by [place] on 07/26/2011.  She has continued to undergo her treatments with oral Revlimid 10 mg daily 1 week on 1 week off, subcutaneous Velcade every 2 weeks and Zometa 3.5 mg IV every 2 months. The patient is due to restart her Revlimid today.  In general, she is tolerating her treatments well.  She has some fatigue for a couple of days after her Velcade injections.  She may be having a little bit more neuropathy in her legs.  That is currently unclear.  Her overall clinical status is unchanged.  We restarted her  on vitamin B12 shots a few weeks ago because her vitamin B12 level came back 284 on 04/23 after the patient had been on oral vitamin B12.  She has also undergone an MRI of the lumbar spine on 08/27/2011 which showed compression fractures. She has seen Dr. Julieanne Cotton on 09/08/2011 and will be undergoing vertebroplasty of T11, T12 and L5 planned for May 24.  All things  considered, the patient is doing well.  She is here today for subcutaneous Velcade 2 mg and Zometa 3.5 mg IV.  In view of her hemoglobin of 11.0 she does not require Aranesp today.  PHYSICAL EXAMINATION:  [name] looks well.  Weight is 117.4  pounds, height 4 feet 10 inches, body surface area 1.48 meters squared.  Blood pressure 130/70.  Other vital signs are normal.  There is no scleral icterus.  Mouth and pharynx are benign.  No peripheral adenopathy palpable.  Heart and lungs were normal.  The patient has a marked dorsal kyphosis.  No Port-A-Cath or central catheter.  Abdomen:  Benign. Extremities:  No peripheral edema.  She has what appears to be a lipoma in the right lower proximal thigh.  She uses a 4 pronged cane which she holds in her right hand.  LABORATORY DATA:  Today, white count 5.1, ANC 3.1, hemoglobin 11.0, hematocrit 33.7, platelets 145,000.  Chemistries, vitamin D level, vitamin B12 level, IgA level, magnesium, LDH and kappa light chains are pending.  Chemistries from 07/26/2011 notable for a glucose of 162, magnesium 1.4 with normal being 1.5-2.5.  BUN was 14, creatinine 1.05, albumin 3.9.  Liver function tests including LDH were normal.  IgA level on 03/12 had fallen to 695 from 1080 on 06/02/2011 prior to starting subcutaneous Velcade.  Kappa lambda ratio was 9.91.  Vitamin D level 36, vitamin B12 284, all on 07/26/2011.  IMAGING STUDIES:  1. Most recent metastatic bone survey was on 08/05/2009. Prior to  that, we had a metastatic bone survey from 11/03/2008 and  07/23/2007. The metastatic bone survey from 08/05/2009 stated that  there were no findings to strongly suggest metastatic disease.  There were multiple sites of degenerative change.  2. Chest x-ray from 02/09/2010 showed new or increased T6 and T7  compression fractures compared with the skeletal survey of  08/05/2009.  3. CT scan of head without IV contrast showed no acute findings. The  calvarium was intact. There was a probable old lacunar infarct in  the right basal ganglia.  4. There are x-rays of the left hip and lumbar spine from 07/16/2010.  There are x-rays of the left humerus and cervical spine from  06/30/2010.  5. An ultrasound  of the right lower extremity was carried out on 08/18/2010  and did not show a discrete mass in the right thigh. 6. Screening mammogram from 02/01/2011 carried out at [facility] was negative. 7. MRI of the lumbar spine with and without IV contrast on 08/27/2011     showed acute/subacute superior endplate fracture of L5 with minimal     loss of vertebral body height and no significant retropulsion.     There was a nonhealed superior endplate compression fracture at T11     and T12 with associated kyphosis.  There was central canal stenosis     greatest at L4-5, foraminal stenosis greatest at L2-3 and L3-4, and     nonhealed sacral insufficiency fractures.  There was multilevel     spondylosis of the lumbar  spine.  IMPRESSION AND PLAN:  Ms. Reis condition remains stable.  Since starting subcutaneous Velcade in combination with Revlimid and Zometa her IgA level has decreased.  Velcade was started on 06/14/2011.  The patient will receive subcutaneous Velcade and IV Zometa today.  She is scheduled for vertebroplasty with Dr. Corliss Skains on 10/07/2011.  She is scheduled to restart her Revlimid today.  We will check CBC, and the patient will have subcutaneous Velcade, Aranesp as needed on May 21 and again on June 4.  Will plan to see Ms. Gilkey again on June 18 at which time she is again scheduled for subcutaneous Velcade.  She is scheduled for intramuscular vitamin B12 on May 21 and June 18 and every 4 weeks thereafter.  On June 18 will be checking CBC, chemistries, LDH, magnesium, vitamin B12 and IgA levels.  We will give consideration to stretching out the treatments with subcutaneous Velcade if the patient seems to be doing well and the IgA level is dropping.  We will consider increasing the interval between treatments with Velcade to every 3 or 4 weeks.  Clinically the patient continues to do well.    ______________________________ Samul Dada, M.D. DSM/MEDQ  D:   09/20/2011  T:  09/20/2011  Job:  [id number]

## 2011-09-20 NOTE — Progress Notes (Signed)
This office note has been dictated.  #[id number]

## 2011-09-21 ENCOUNTER — Telehealth: Payer: Self-pay | Admitting: *Deleted

## 2011-09-21 ENCOUNTER — Telehealth: Payer: Self-pay | Admitting: Oncology

## 2011-09-21 LAB — COMPREHENSIVE METABOLIC PANEL
ALT: 9 U/L (ref 0–35)
AST: 14 U/L (ref 0–37)
Alkaline Phosphatase: 44 U/L (ref 39–117)
Creatinine, Ser: 0.75 mg/dL (ref 0.50–1.10)
Sodium: 134 mEq/L — ABNORMAL LOW (ref 135–145)
Total Bilirubin: 0.4 mg/dL (ref 0.3–1.2)
Total Protein: 6 g/dL (ref 6.0–8.3)

## 2011-09-21 LAB — KAPPA/LAMBDA LIGHT CHAINS
Kappa free light chain: 6.38 mg/dL — ABNORMAL HIGH (ref 0.33–1.94)
Kappa:Lambda Ratio: 2.16 — ABNORMAL HIGH (ref 0.26–1.65)
Lambda Free Lght Chn: 2.95 mg/dL — ABNORMAL HIGH (ref 0.57–2.63)

## 2011-09-21 LAB — VITAMIN D 25 HYDROXY (VIT D DEFICIENCY, FRACTURES): Vit D, 25-Hydroxy: 37 ng/mL (ref 30–89)

## 2011-09-21 LAB — VITAMIN B12: Vitamin B-12: 691 pg/mL (ref 211–911)

## 2011-09-21 NOTE — Telephone Encounter (Signed)
pt aware of 5/21 appts and will p/u a sch for all other appts at that time  aom

## 2011-09-21 NOTE — Telephone Encounter (Signed)
Per staff message from Anne, I have scheduled treatment appts for the patient. Appts in computer and Anne aware.  [name]  

## 2011-09-22 ENCOUNTER — Telehealth: Payer: Self-pay

## 2011-09-22 NOTE — Telephone Encounter (Signed)
Increase her magnesium to qid from tid per her [facility] and Dr Arline Asp

## 2011-09-23 ENCOUNTER — Other Ambulatory Visit (HOSPITAL_COMMUNITY): Payer: Self-pay | Admitting: Interventional Radiology

## 2011-09-23 DIAGNOSIS — IMO0002 Reserved for concepts with insufficient information to code with codable children: Secondary | ICD-10-CM

## 2011-09-27 ENCOUNTER — Encounter (HOSPITAL_COMMUNITY): Payer: Self-pay | Admitting: Pharmacy Technician

## 2011-09-27 ENCOUNTER — Telehealth (HOSPITAL_COMMUNITY): Payer: Self-pay

## 2011-09-27 NOTE — Telephone Encounter (Signed)
09-27-11  I spoke with Christina Hopkins today.  She stated that her pain level was at a 5 or a 6.  She is still taking her pain medication but, she isn't seeing much relief.  She stated that if she doesn't take it the pain is sharper.  I told the pt that I would make Dr. Corliss Skains aware of what she has stated.

## 2011-09-28 ENCOUNTER — Other Ambulatory Visit: Payer: Self-pay | Admitting: Radiology

## 2011-10-04 ENCOUNTER — Telehealth: Payer: Self-pay | Admitting: Oncology

## 2011-10-04 ENCOUNTER — Other Ambulatory Visit (HOSPITAL_BASED_OUTPATIENT_CLINIC_OR_DEPARTMENT_OTHER): Payer: Medicare Other | Admitting: Lab

## 2011-10-04 ENCOUNTER — Ambulatory Visit (HOSPITAL_BASED_OUTPATIENT_CLINIC_OR_DEPARTMENT_OTHER): Payer: Medicare Other

## 2011-10-04 DIAGNOSIS — D518 Other vitamin B12 deficiency anemias: Secondary | ICD-10-CM

## 2011-10-04 DIAGNOSIS — D519 Vitamin B12 deficiency anemia, unspecified: Secondary | ICD-10-CM

## 2011-10-04 DIAGNOSIS — C9000 Multiple myeloma not having achieved remission: Secondary | ICD-10-CM

## 2011-10-04 DIAGNOSIS — Z5112 Encounter for antineoplastic immunotherapy: Secondary | ICD-10-CM

## 2011-10-04 LAB — CBC WITH DIFFERENTIAL/PLATELET
BASO%: 0.5 % (ref 0.0–2.0)
EOS%: 1.8 % (ref 0.0–7.0)
HCT: 33.1 % — ABNORMAL LOW (ref 34.8–46.6)
MCH: 31.6 pg (ref 25.1–34.0)
MCHC: 32.6 g/dL (ref 31.5–36.0)
NEUT%: 54.2 % (ref 38.4–76.8)
RBC: 3.42 10*6/uL — ABNORMAL LOW (ref 3.70–5.45)
WBC: 5.7 10*3/uL (ref 3.9–10.3)
lymph#: 1.6 10*3/uL (ref 0.9–3.3)
nRBC: 0 % (ref 0–0)

## 2011-10-04 MED ORDER — CYANOCOBALAMIN 1000 MCG/ML IJ SOLN
1000.0000 ug | Freq: Once | INTRAMUSCULAR | Status: AC
  Administered 2011-10-04: 1000 ug via INTRAMUSCULAR

## 2011-10-04 MED ORDER — ONDANSETRON HCL 8 MG PO TABS
8.0000 mg | ORAL_TABLET | Freq: Once | ORAL | Status: AC
  Administered 2011-10-04: 8 mg via ORAL

## 2011-10-04 MED ORDER — BORTEZOMIB CHEMO SQ INJECTION 3.5 MG (2.5MG/ML)
1.3000 mg/m2 | Freq: Once | INTRAMUSCULAR | Status: AC
  Administered 2011-10-04: 2 mg via SUBCUTANEOUS
  Filled 2011-10-04: qty 2

## 2011-10-04 NOTE — Telephone Encounter (Signed)
Gave appt calendar to pt's daughter today for May, June  And July 2013

## 2011-10-05 ENCOUNTER — Other Ambulatory Visit: Payer: Self-pay | Admitting: Physician Assistant

## 2011-10-06 ENCOUNTER — Encounter (HOSPITAL_COMMUNITY): Payer: Self-pay | Admitting: Pharmacy Technician

## 2011-10-07 ENCOUNTER — Ambulatory Visit (HOSPITAL_COMMUNITY)
Admission: RE | Admit: 2011-10-07 | Discharge: 2011-10-07 | Disposition: A | Discharge status: Home / Self Care | Payer: Medicare Other | Source: Ambulatory Visit | Attending: Interventional Radiology | Admitting: Interventional Radiology

## 2011-10-07 ENCOUNTER — Other Ambulatory Visit (HOSPITAL_COMMUNITY): Payer: Self-pay | Admitting: Interventional Radiology

## 2011-10-07 ENCOUNTER — Encounter (HOSPITAL_COMMUNITY): Payer: Self-pay

## 2011-10-07 DIAGNOSIS — IMO0002 Reserved for concepts with insufficient information to code with codable children: Secondary | ICD-10-CM

## 2011-10-07 DIAGNOSIS — S32009A Unspecified fracture of unspecified lumbar vertebra, initial encounter for closed fracture: Secondary | ICD-10-CM | POA: Insufficient documentation

## 2011-10-07 DIAGNOSIS — D649 Anemia, unspecified: Secondary | ICD-10-CM | POA: Insufficient documentation

## 2011-10-07 DIAGNOSIS — Z951 Presence of aortocoronary bypass graft: Secondary | ICD-10-CM | POA: Insufficient documentation

## 2011-10-07 DIAGNOSIS — E785 Hyperlipidemia, unspecified: Secondary | ICD-10-CM | POA: Insufficient documentation

## 2011-10-07 DIAGNOSIS — I10 Essential (primary) hypertension: Secondary | ICD-10-CM | POA: Insufficient documentation

## 2011-10-07 DIAGNOSIS — X58XXXA Exposure to other specified factors, initial encounter: Secondary | ICD-10-CM | POA: Insufficient documentation

## 2011-10-07 DIAGNOSIS — I252 Old myocardial infarction: Secondary | ICD-10-CM | POA: Insufficient documentation

## 2011-10-07 DIAGNOSIS — Z79899 Other long term (current) drug therapy: Secondary | ICD-10-CM | POA: Insufficient documentation

## 2011-10-07 DIAGNOSIS — M810 Age-related osteoporosis without current pathological fracture: Secondary | ICD-10-CM | POA: Insufficient documentation

## 2011-10-07 DIAGNOSIS — Z85038 Personal history of other malignant neoplasm of large intestine: Secondary | ICD-10-CM | POA: Insufficient documentation

## 2011-10-07 DIAGNOSIS — S22009A Unspecified fracture of unspecified thoracic vertebra, initial encounter for closed fracture: Secondary | ICD-10-CM | POA: Insufficient documentation

## 2011-10-07 DIAGNOSIS — E119 Type 2 diabetes mellitus without complications: Secondary | ICD-10-CM | POA: Insufficient documentation

## 2011-10-07 DIAGNOSIS — Z90710 Acquired absence of both cervix and uterus: Secondary | ICD-10-CM | POA: Insufficient documentation

## 2011-10-07 LAB — BASIC METABOLIC PANEL
BUN: 13 mg/dL (ref 6–23)
CO2: 24 mEq/L (ref 19–32)
Chloride: 95 mEq/L — ABNORMAL LOW (ref 96–112)
Creatinine, Ser: 0.94 mg/dL (ref 0.50–1.10)
GFR calc Af Amer: 63 mL/min — ABNORMAL LOW (ref >90)
Glucose, Bld: 129 mg/dL — ABNORMAL HIGH (ref 70–99)

## 2011-10-07 LAB — CBC
HCT: 30.8 % — ABNORMAL LOW (ref 36.0–46.0)
Hemoglobin: 10.2 g/dL — ABNORMAL LOW (ref 12.0–15.0)
MCH: 31.5 pg (ref 26.0–34.0)
MCHC: 33.1 g/dL (ref 30.0–36.0)
MCV: 95.1 fL (ref 78.0–100.0)
RDW: 16.4 % — ABNORMAL HIGH (ref 11.5–15.5)

## 2011-10-07 LAB — APTT: aPTT: 29 seconds (ref 24–37)

## 2011-10-07 LAB — GLUCOSE, CAPILLARY: Glucose-Capillary: 109 mg/dL — ABNORMAL HIGH (ref 70–99)

## 2011-10-07 MED ORDER — MIDAZOLAM HCL 5 MG/5ML IJ SOLN
INTRAMUSCULAR | Status: DC | PRN
  Administered 2011-10-07 (×2): 1 mg via INTRAVENOUS

## 2011-10-07 MED ORDER — NALOXONE HCL 0.4 MG/ML IJ SOLN
INTRAMUSCULAR | Status: DC | PRN
  Administered 2011-10-07: 0.2 mg via INTRAVENOUS

## 2011-10-07 MED ORDER — TOBRAMYCIN SULFATE 1.2 G IJ SOLR
INTRAMUSCULAR | Status: AC
  Filled 2011-10-07: qty 1.2

## 2011-10-07 MED ORDER — MIDAZOLAM HCL 2 MG/2ML IJ SOLN
INTRAMUSCULAR | Status: AC
  Filled 2011-10-07: qty 6

## 2011-10-07 MED ORDER — FENTANYL CITRATE 0.05 MG/ML IJ SOLN
INTRAMUSCULAR | Status: AC
  Filled 2011-10-07: qty 6

## 2011-10-07 MED ORDER — CEFAZOLIN SODIUM 1-5 GM-% IV SOLN
INTRAVENOUS | Status: AC
  Filled 2011-10-07: qty 50

## 2011-10-07 MED ORDER — SODIUM CHLORIDE 0.9 % IV SOLN: Freq: Once | INTRAVENOUS | Status: DC

## 2011-10-07 MED ORDER — HYDROMORPHONE HCL PF 1 MG/ML IJ SOLN
INTRAMUSCULAR | Status: AC
  Filled 2011-10-07: qty 2

## 2011-10-07 MED ORDER — SODIUM CHLORIDE 0.9 % IV SOLN: INTRAVENOUS | Status: AC

## 2011-10-07 MED ORDER — CEFAZOLIN SODIUM 1-5 GM-% IV SOLN
1.0000 g | Freq: Once | INTRAVENOUS | Status: AC
  Administered 2011-10-07: 1 g via INTRAVENOUS

## 2011-10-07 MED ORDER — IOHEXOL 300 MG/ML  SOLN: 50.0000 mL | Freq: Once | INTRAMUSCULAR | Status: AC | PRN

## 2011-10-07 MED ORDER — FLUMAZENIL 0.5 MG/5ML IV SOLN
INTRAVENOUS | Status: AC
  Filled 2011-10-07: qty 5

## 2011-10-07 MED ORDER — FENTANYL CITRATE 0.05 MG/ML IJ SOLN
INTRAMUSCULAR | Status: DC | PRN
  Administered 2011-10-07 (×2): 25 ug via INTRAVENOUS

## 2011-10-07 MED ORDER — NALOXONE HCL 0.4 MG/ML IJ SOLN
INTRAMUSCULAR | Status: AC
  Filled 2011-10-07: qty 1

## 2011-10-07 MED ORDER — HYDROMORPHONE HCL PF 1 MG/ML IJ SOLN
INTRAMUSCULAR | Status: DC | PRN
  Administered 2011-10-07: 1 mg

## 2011-10-07 NOTE — Discharge Instructions (Signed)
1. Use walker for 2 weeks. 2.No stooping,bending,lifting more than 10 lbs for 2 weeks .3.No driving for 2 weeks.4RTC in 2  Weeks.      KYPHOPLASTY/VERTEBROPLASTY DISCHARGE INSTRUCTIONS  Medications: (check all that apply)     Resume all home medications as before procedure.       Resume your (aspirin/Plavix/Coumadin) on 10/07/11.                  Continue your pain medications as prescribed as needed.  Over the next 3-5 days, decrease your pain medication as tolerated.  Over the counter medications (i.e. Tylenol, ibuprofen, and aleve) may be substituted once severe/moderate pain symptoms have subsided.   Wound Care:   Bandages may be removed the day following your procedure.  You may get your incision wet once bandages are removed.  Bandaids may be used to cover the incisions until scab formation.  Topical ointments are optional.    If you develop a fever greater than 101 degrees, have increased skin redness at the incision sites or pus-like oozing from incisions occurring within 1 week of the procedure, contact radiology at [phone] or [phone].    Ice pack to back for 15-20 minutes 2-3 time per day for first 2-3 days post procedure.  The ice will expedite muscle healing and help with the pain from the incisions.   Activity:   Bedrest today with limited activity for 24 hours post procedure.    No driving for 48 hours.    Increase your activity as tolerated after bedrest (with assistance if necessary).    Refrain from any strenuous activity or heavy lifting (greater than 10 lbs.).   Follow up:   Contact radiology at [phone] or [phone] if any questions/concerns.    A physician assistant from radiology will contact you in approximately 1 week.    If a biopsy was performed at the time of your procedure, your referring physician should receive the results in usually 2-3 days.

## 2011-10-07 NOTE — H&P (Signed)
Chief Complaint: Multiple vertebral fractures Referring Physician:Dr. Murinson HPI: Christina Hopkins is an 78 y.o. female with hx of multiple myeloma and back pain found to have comp fx at T11, T12, and L5 levels. Saw Dr. Corliss Skains in office who offered treatment. She initially felt okay with her pain level but after increasing her activity, her pain level increased. She has now decided to proceed with treatment.  She is otherwise well, no recent illnesses, diarrhea, UTI No recent CP, SOB, palpitations.  Past Medical History:  Past Medical History  Diagnosis Date  . colon ca dx'd 2003    surg only  . Multiple myeloma dx'd 2009    oral chemo ongoing  . Hypertension   . Diabetes mellitus   . Anemia   . Osteoporosis   . Dyslipidemia   . Myocardial infarction     Past Surgical History:  Past Surgical History  Procedure Date  . Coronary artery bypass graft 08/2001    4 vessel  . Colon surgery 2003  . Tonsillectomy   . Appendectomy   . Abdominal hysterectomy     Family History:  Family History  Problem Relation Age of Onset  . Heart disease Brother   . Diabetes Brother     Social History:  reports that she has never smoked. She does not have any smokeless tobacco history on file. She reports that she drinks about .6 ounces of alcohol per week. Her drug history not on file.  Allergies:  Allergies  Allergen Reactions  . Gemfibrozil Nausea And Vomiting  . Nifedipine Hives    Medications: acyclovir (ZOVIRAX) 400 MG tablet (Taking) Sig - Route: Take 400 mg by mouth 2 (two) times daily. - aspirin 81 MG tablet (Taking) Sig - Route: Take 81 mg by mouth daily. -  atorvastatin (LIPITOR) 40 MG tablet (Taking) Sig - Route: Take 40 mg by mouth daily. -carvedilol (COREG) 25 MG tablet (Taking) Sig - Route: Take 25 mg by mouth 2 (two) times daily with a meal. -  cholecalciferol (VITAMIN D) 1000 UNITS tablet (Taking) Sig - Route: Take 1,000 Units by mouth 3 (three) times daily. -   cyanocobalamin 1000 MCG tablet (Taking) Sig - Route: Take 1,000 mcg by mouth daily. -  cyclobenzaprine (FLEXERIL) 10 MG tablet (Taking) Sig - Route: Take 10 mg by mouth 3 (three) times daily as needed. For muscle spasm - HYDROcodone-acetaminophen (VICODIN) 5-500 MG per tablet (Taking) 06/02/2011 Sig - Route: Take 1 tablet by mouth every 6 (six) hours as needed. - lenalidomide (REVLIMID) 10 MG capsule (Taking) 09/14/2011 Sig - Route: Take 10 mg by mouth See admin instructions. Take 1 po daily x 7 days on, then 7 days off. Auth # [id number] 09/14/11 -  lisinopril (PRINIVIL,ZESTRIL) 10 MG tablet (Taking) Sig - Route: Take 10 mg by mouth daily. -Magnesium 400 MG CAPS (Taking) Sig - Route: Take 1 capsule by mouth 4 (four) times daily. -  metFORMIN (GLUCOPHAGE) 500 MG tablet (Taking) Sig - Route: Take 500 mg by mouth 2 (two) times daily with a meal. -  mirtazapine (REMERON) 15 MG tablet (Taking) Sig - Route: Take 15 mg by mouth at bedtime. -  Multiple Vitamins-Minerals (CENTRUM SILVER PO) (Taking) Sig - Route: Take 1 tablet by mouth daily. -  pantoprazole (PROTONIX) 40 MG tablet (Taking) Sig - Route: Take 40 mg by mouth daily. - Polyethyl Glycol-Propyl Glycol (SYSTANE OP) (Taking) Sig - Route: Place 2 drops into both eyes daily. - Both Eyes  prochlorperazine (COMPAZINE) 10 MG tablet (  Taking) 04/05/2011 Sig - Route: Take 10 mg by mouth every 6 (six) hours as needed. For nausea -  spironolactone (ALDACTONE) 25 MG tablet (Taking) Sig - Route: Take 25 mg by mouth every other day. -   Please HPI for pertinent positives, otherwise complete 10 system ROS negative.  Physical Exam: Blood pressure 118/63, pulse 67, temperature 96.8 F (36 C), temperature source Oral, resp. rate 18, height 4\' 11"  (1.499 m), weight 118 lb (53.524 kg), SpO2 93.00%. Body mass index is 23.83 kg/(m^2).   General Appearance:  Alert, cooperative, no distress, appears stated age  Head:  Normocephalic, without obvious abnormality, atraumatic  ENT:  Unremarkable  Neck: Supple, symmetrical, trachea midline, no adenopathy, thyroid: not enlarged, symmetric, no tenderness/mass/nodules  Lungs:   Clear to auscultation bilaterally, no w/r/r, respirations unlabored without use of accessory muscles.  Chest Wall:  No tenderness or deformity, sternotomy scar noted  Heart:  Regular rate and rhythm, S1, S2 normal, no murmur, rub or gallop. Carotids 2+ without bruit.  Abdomen:   Soft, non-tender, non distended. Bowel sounds active all four quadrants,  no masses, no organomegaly.  Extremities: Extremities normal, atraumatic, no cyanosis or edema  Pulses: 2+ and symmetric  Skin: Skin color, texture, turgor normal, no rashes or lesions  Neurologic: Normal affect, no gross deficits.   Results for orders placed during the hospital encounter of 10/07/11 (from the past 48 hour(s))  APTT     Status: Normal   Collection Time   10/07/11  7:20 AM      Component Value Range Comment   aPTT 29  24 - 37 (seconds)   CBC     Status: Abnormal (Preliminary result)   Collection Time   10/07/11  7:20 AM      Component Value Range Comment   WBC 3.5 (*) 4.0 - 10.5 (K/uL)    RBC 3.24 (*) 3.87 - 5.11 (MIL/uL)    Hemoglobin 10.2 (*) 12.0 - 15.0 (g/dL)    HCT 16.1 (*) 09.6 - 46.0 (%)    MCV 95.1  78.0 - 100.0 (fL)    MCH 31.5  26.0 - 34.0 (pg)    MCHC 33.1  30.0 - 36.0 (g/dL)    RDW 04.5 (*) 40.9 - 15.5 (%)    Platelets PENDING  150 - 400 (K/uL)   PROTIME-INR     Status: Normal   Collection Time   10/07/11  7:20 AM      Component Value Range Comment   Prothrombin Time 14.6  11.6 - 15.2 (seconds)    INR 1.12  0.00 - 1.49    GLUCOSE, CAPILLARY     Status: Abnormal   Collection Time   10/07/11  7:25 AM      Component Value Range Comment   Glucose-Capillary 125 (*) 70 - 99 (mg/dL)    No results found.  Assessment/Plan Compression fractures at T11, T12, and L5 Hx of multiple myeloma. Labs ok. Proceed with with VP/KP possible ablation. Reviewed procedure  including risks, complications, post procedure limitations. Consent signed in chart.  Brayton El PA-C 10/07/2011, 8:00 AM

## 2011-10-07 NOTE — Procedures (Signed)
S/P T11,T12 and L5 KPs.

## 2011-10-12 ENCOUNTER — Other Ambulatory Visit: Payer: Self-pay | Admitting: *Deleted

## 2011-10-12 MED ORDER — LENALIDOMIDE 10 MG PO CAPS: 10.0000 mg | ORAL_CAPSULE | ORAL | Status: DC

## 2011-10-17 NOTE — Telephone Encounter (Signed)
[facility] faxed confirmation of prescription shipment.  Revlimid was shipped 10-14-2011 with next business day delivery.

## 2011-10-18 ENCOUNTER — Other Ambulatory Visit: Payer: Self-pay | Admitting: Oncology

## 2011-10-18 ENCOUNTER — Ambulatory Visit (HOSPITAL_BASED_OUTPATIENT_CLINIC_OR_DEPARTMENT_OTHER): Payer: Medicare Other

## 2011-10-18 ENCOUNTER — Other Ambulatory Visit (HOSPITAL_BASED_OUTPATIENT_CLINIC_OR_DEPARTMENT_OTHER): Payer: Medicare Other | Admitting: Lab

## 2011-10-18 DIAGNOSIS — E612 Magnesium deficiency: Secondary | ICD-10-CM

## 2011-10-18 DIAGNOSIS — D509 Iron deficiency anemia, unspecified: Secondary | ICD-10-CM

## 2011-10-18 DIAGNOSIS — I10 Essential (primary) hypertension: Secondary | ICD-10-CM

## 2011-10-18 DIAGNOSIS — I2510 Atherosclerotic heart disease of native coronary artery without angina pectoris: Secondary | ICD-10-CM

## 2011-10-18 DIAGNOSIS — D519 Vitamin B12 deficiency anemia, unspecified: Secondary | ICD-10-CM

## 2011-10-18 DIAGNOSIS — C9000 Multiple myeloma not having achieved remission: Secondary | ICD-10-CM

## 2011-10-18 DIAGNOSIS — E538 Deficiency of other specified B group vitamins: Secondary | ICD-10-CM

## 2011-10-18 DIAGNOSIS — Z5112 Encounter for antineoplastic immunotherapy: Secondary | ICD-10-CM

## 2011-10-18 DIAGNOSIS — D649 Anemia, unspecified: Secondary | ICD-10-CM

## 2011-10-18 DIAGNOSIS — E785 Hyperlipidemia, unspecified: Secondary | ICD-10-CM

## 2011-10-18 LAB — CBC WITH DIFFERENTIAL/PLATELET
BASO%: 0.4 % (ref 0.0–2.0)
EOS%: 1.5 % (ref 0.0–7.0)
HCT: 30.1 % — ABNORMAL LOW (ref 34.8–46.6)
LYMPH%: 29 % (ref 14.0–49.7)
MCH: 31.2 pg (ref 25.1–34.0)
MCHC: 32.9 g/dL (ref 31.5–36.0)
NEUT%: 51.9 % (ref 38.4–76.8)
lymph#: 1.5 10*3/uL (ref 0.9–3.3)

## 2011-10-18 MED ORDER — DARBEPOETIN ALFA-POLYSORBATE 300 MCG/0.6ML IJ SOLN
300.0000 ug | Freq: Once | INTRAMUSCULAR | Status: AC
  Administered 2011-10-18: 300 ug via SUBCUTANEOUS
  Filled 2011-10-18: qty 0.6

## 2011-10-18 MED ORDER — ONDANSETRON HCL 8 MG PO TABS
8.0000 mg | ORAL_TABLET | Freq: Once | ORAL | Status: AC
  Administered 2011-10-18: 8 mg via ORAL

## 2011-10-18 MED ORDER — BORTEZOMIB CHEMO SQ INJECTION 3.5 MG (2.5MG/ML)
1.3000 mg/m2 | Freq: Once | INTRAMUSCULAR | Status: AC
  Administered 2011-10-18: 2 mg via SUBCUTANEOUS
  Filled 2011-10-18: qty 2

## 2011-10-18 NOTE — Patient Instructions (Signed)

## 2011-10-21 ENCOUNTER — Ambulatory Visit (HOSPITAL_COMMUNITY)
Admission: RE | Admit: 2011-10-21 | Discharge: 2011-10-21 | Disposition: A | Discharge status: Home / Self Care | Payer: Medicare Other | Source: Ambulatory Visit | Attending: Interventional Radiology | Admitting: Interventional Radiology

## 2011-10-21 DIAGNOSIS — IMO0002 Reserved for concepts with insufficient information to code with codable children: Secondary | ICD-10-CM

## 2011-10-26 ENCOUNTER — Other Ambulatory Visit: Payer: Self-pay | Admitting: Oncology

## 2011-11-01 ENCOUNTER — Encounter: Payer: Self-pay | Admitting: Oncology

## 2011-11-01 ENCOUNTER — Ambulatory Visit (HOSPITAL_BASED_OUTPATIENT_CLINIC_OR_DEPARTMENT_OTHER): Payer: Medicare Other

## 2011-11-01 ENCOUNTER — Telehealth: Payer: Self-pay | Admitting: *Deleted

## 2011-11-01 ENCOUNTER — Ambulatory Visit (HOSPITAL_BASED_OUTPATIENT_CLINIC_OR_DEPARTMENT_OTHER): Payer: Medicare Other | Admitting: Oncology

## 2011-11-01 ENCOUNTER — Other Ambulatory Visit (HOSPITAL_BASED_OUTPATIENT_CLINIC_OR_DEPARTMENT_OTHER): Payer: Medicare Other | Admitting: Lab

## 2011-11-01 ENCOUNTER — Other Ambulatory Visit: Payer: Self-pay | Admitting: Oncology

## 2011-11-01 ENCOUNTER — Telehealth: Payer: Self-pay | Admitting: Oncology

## 2011-11-01 VITALS — BP 102/58 | HR 75 | Temp 98.1°F | Ht 59.0 in | Wt 115.8 lb

## 2011-11-01 DIAGNOSIS — D519 Vitamin B12 deficiency anemia, unspecified: Secondary | ICD-10-CM

## 2011-11-01 DIAGNOSIS — Z5112 Encounter for antineoplastic immunotherapy: Secondary | ICD-10-CM

## 2011-11-01 DIAGNOSIS — C9000 Multiple myeloma not having achieved remission: Secondary | ICD-10-CM

## 2011-11-01 DIAGNOSIS — D518 Other vitamin B12 deficiency anemias: Secondary | ICD-10-CM

## 2011-11-01 DIAGNOSIS — E559 Vitamin D deficiency, unspecified: Secondary | ICD-10-CM

## 2011-11-01 DIAGNOSIS — E612 Magnesium deficiency: Secondary | ICD-10-CM

## 2011-11-01 LAB — COMPREHENSIVE METABOLIC PANEL
AST: 17 U/L (ref 0–37)
Alkaline Phosphatase: 51 U/L (ref 39–117)
BUN: 14 mg/dL (ref 6–23)
Creatinine, Ser: 1.01 mg/dL (ref 0.50–1.10)
Potassium: 4.7 mEq/L (ref 3.5–5.3)

## 2011-11-01 LAB — CBC WITH DIFFERENTIAL/PLATELET
Basophils Absolute: 0 10*3/uL (ref 0.0–0.1)
EOS%: 1.4 % (ref 0.0–7.0)
Eosinophils Absolute: 0.1 10*3/uL (ref 0.0–0.5)
HGB: 11.2 g/dL — ABNORMAL LOW (ref 11.6–15.9)
NEUT#: 3 10*3/uL (ref 1.5–6.5)
RDW: 19.2 % — ABNORMAL HIGH (ref 11.2–14.5)
lymph#: 1.1 10*3/uL (ref 0.9–3.3)

## 2011-11-01 LAB — VITAMIN B12: Vitamin B-12: 636 pg/mL (ref 211–911)

## 2011-11-01 LAB — IGA: IgA: 248 mg/dL (ref 69–380)

## 2011-11-01 MED ORDER — BORTEZOMIB CHEMO SQ INJECTION 3.5 MG (2.5MG/ML)
1.3000 mg/m2 | Freq: Once | INTRAMUSCULAR | Status: AC
  Administered 2011-11-01: 2 mg via SUBCUTANEOUS
  Filled 2011-11-01: qty 2

## 2011-11-01 MED ORDER — ONDANSETRON HCL 8 MG PO TABS
8.0000 mg | ORAL_TABLET | Freq: Once | ORAL | Status: AC
  Administered 2011-11-01: 8 mg via ORAL

## 2011-11-01 MED ORDER — CYANOCOBALAMIN 1000 MCG/ML IJ SOLN
1000.0000 ug | Freq: Once | INTRAMUSCULAR | Status: AC
  Administered 2011-11-01: 1000 ug via INTRAMUSCULAR

## 2011-11-01 NOTE — Telephone Encounter (Signed)
gve the pt her July,aug 2013 appt calendar. They are aware the chemo will be added for aug. Sent michelle a staff message

## 2011-11-01 NOTE — Progress Notes (Signed)
CC:   [name], M.D.  PROBLEM LIST:  1. Multiple myeloma, initially presenting as an IgA kappa monoclonal  gammopathy in February 2009. 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 This was felt to be an adverse prognostic  determinant. Bone marrow on 08/31/2007 showed 46% plasma cells. A  repeat bone marrow on 08/05/2009 showed 73% plasma cells.  Treatment with Revlimid was started in April 2011. Zometa was  started in October 2011. We had previously started Aranesp for the  patient's anemia in May of 2010. Most recent metastatic bone  survey was on 08/05/2009. Other x-rays since then are available.  Maximum IgA level was 2080 on 08/11/2009. The lowest IgA level  following treatment was 489 on 10/05/2010.  Subcutaneous Velcade was added  to the patient's treatment program on 06/14/2011 because of her  rising IgA level 1080 on 06/02/2011. The patient is receiving  Velcade every 2 weeks in combination with Revlimid being given 10  mg 1 week on 1 week off, Zometa 3.5 mg IV every 2 months and  Aranesp 300 mcg subcu every 2 weeks for hemoglobin less than or  equal to 10.  2. Anemia secondary to vitamin B12 deficiency.  The patient had been on oral vitamin B12. However, her vitamin B12  level fell to 284 on 07/26/2011 and vitamin B12 shots 1000 mcg  given IM every month was resumed on 09/06/2011.  3. Anemia secondary to iron deficiency.  4. Vitamin D deficiency.  5. Magnesium deficiency.  6. Diabetes mellitus.  7. Hypertension.  8. Dyslipidemia.  9. Coronary artery disease.  10.Right anterior thigh mass, most likely a lipoma.  11.Multiple vertebral compression fractures as seen  on MRI of the lumbar spine on 08/27/2011. Kyphoplasty was carried out at L5, T11 and T12 on 10/07/2011 by Dr. Julieanne Cotton.   MEDICATIONS:  1. Aspirin 81 mg daily.  2. Lipitor 40 mg daily.  3. Coreg 50 mg daily.  4. Vitamin D 3000 international units daily.  5. Vitamin B12 1000 mcg IM  monthly, restarted on 09/06/11.  6. Vicodin 5/500 as needed.  7. Acyclovir 400 mg p.o. b.i.d..  8. Lisinopril 10 mg daily.  9. Magnesium oxide 400 mg 4 times a day.  10.Glucophage 500 mg twice a day.  11.Remeron 15 mg at bedtime.  12.Multivitamins daily.  13.Protonix 40 mg daily.  14.Compazine 10 mg as needed.  15.Aldactone 25 mg every other day as needed  16.Revlimid 10 mg daily 1 week on, 1 week off  17.Velcade 2 mg subcu every 2 weeks which was started on 06/14/2011.     Starting today, 11/01/2011 we will be increasing the interval to     every 4 weeks. 18.Aranesp 300 mcg subcu every 2 weeks for hemoglobin less than or     equal to 10.  Starting today, 11/01/2011 we will be increasing the     interval to every 4 weeks. 19.Zometa 3.5 mg IV every 2 months.    HISTORY:  Christina Hopkins was seen today for followup of her IgA kappa multiple myeloma.  Mrs. Behler was accompanied by her daughter Christina Hopkins.  She was last seen by [place] on 09/20/2011.  Currently she is doing quite well. She has continued to undergo treatment with oral Revlimid 10 mg daily 1 week on 1 week off.  The patient will be restarting her Revlimid today after a 1 week break.  She has been receiving subcutaneous Velcade every 2 weeks, intramuscular vitamin B12 level every  4 weeks and Zometa 3.5 mg IV every 2 months.  Starting today we are going to give the Velcade, vitamin B12, and Aranesp every 4 weeks.  Zometa will continue to be given IV every 2 months.  The patient has been receiving Aranesp 300 mcg subcu every 2 weeks for hemoglobin less than or equal to 10.  The patient has tolerated her treatments well.  She underwent kyphoplasty on May 24th by Dr. Corliss Skains.  He did kyphoplasty on L5, T11 and T12.  Biopsies were also obtained which showed increased numbers of plasma cells.  The patient apparently tolerated these procedures well, and reports marked improvement in her back pain.  She is without any major complaints  today.  She does have some sense of fatigue following her Velcade.  PHYSICAL EXAM:  There is little change.  Weight is 115.8 pounds, height 4 feet 11 inches, body surface area 1.48 m2.  Blood pressure 102/58. Other vital signs are normal.  There is no scleral icterus.  Mouth and pharynx are benign.  No peripheral adenopathy palpable.  Heart/Lungs: Normal.  The patient has marked dorsal kyphosis.  No Port-A-Cath or central catheter.  Abdomen:  Distended and tympanitic.  The complains of some bloating.  Abdomen is nontender, but somewhat tight.  Extremities: No peripheral edema.  She uses a 4 pronged cane which she holds in her right hand.  In the past we have noted an apparent lipoma in the right lower proximal thigh.  Neurologic:  Nonfocal.  LABORATORY DATA:  Today, white count 5.0, ANC 3.0, hemoglobin 11.2, hematocrit 33.9, platelets 167,000.  Chemistries including LDH, magnesium, IgA level, vitamin B12 are pending.  Chemistries from 09/20/2011 were notable for sodium of 134, magnesium 1.2, otherwise normal.  Albumin was 3.7, BUN 14, creatinine 0.75.  Liver function tests were normal.  IgA level was down to 272 from 695 on 07/26/2011 and 1080 on 06/02/2011.  The kappa lambda ratio also had decreased down to 2.16 from 10.33 on 06/02/2011.  Vitamin B12 level on 05/07 was 691.  Vitamin D level on 05/07 was 37.   IMAGING STUDIES:  1. Most recent metastatic bone survey was on 08/05/2009. Prior to  that, we had a metastatic bone survey from 11/03/2008 and  07/23/2007. The metastatic bone survey from 08/05/2009 stated that  there were no findings to strongly suggest metastatic disease.  There were multiple sites of degenerative change.  2. Chest x-ray from 02/09/2010 showed new or increased T6 and T7  compression fractures compared with the skeletal survey of  08/05/2009.  3. CT scan of head without IV contrast showed no acute findings. The  calvarium was intact. There was a probable old  lacunar infarct in  the right basal ganglia.  4. There are x-rays of the left hip and lumbar spine from 07/16/2010.  There are x-rays of the left humerus and cervical spine from  06/30/2010.  5. An ultrasound of the right lower extremity was carried out on 08/18/2010  and did not show a discrete mass in the right thigh.  6. Screening mammogram from 02/01/2011 carried out at [facility] was negative.  7. MRI of the lumbar spine with and without IV contrast on 08/27/2011  showed acute/subacute superior endplate fracture of L5 with minimal  loss of vertebral body height and no significant retropulsion.  There was a nonhealed superior endplate compression fracture at T11  and T12 with associated kyphosis. There was central canal stenosis  greatest at L4-5, foraminal stenosis  greatest at L2-3 and L3-4, and  nonhealed sacral insufficiency fractures. There was multilevel  spondylosis of the lumbar spine. 8. Kyphoplasty at L5, T11 and T12 carried out on 10/07/2011 by Dr.     Julieanne Cotton was carried out successfully.  Biopsies showed     blood clot and hypercellular marrow with increased plasma cell     infiltrates at L5, T11 and T12.  IMPRESSION AND PLAN:  Mrs. Schoenfelder continues to do well.  I am happy to see that her IgA level and kappa lambda ratio are decreasing.  Mrs. Tworek is due for subcutaneous Velcade today 2 mg.  Also vitamin B12 1000 mcg IM.  We had been treating her with Velcade and checking CBCs every 2 weeks.  We are going to try to move everything out to 4 weeks.  The patient was scheduled for Zometa to be given on July 2nd.  We are going to postpone that to July 13th when the patient will be due for CBC.  She will receive additional Velcade and vitamin B12 on July 16th.  She will receive Aranesp 300 mcg subcu if her hemoglobin is less than or equal to 10.  She will continue with Revlimid 10 mg daily 1 week on 1 week off. The patient will also be due for Zometa 3.5  mg IV on July 16th.  Will plan to see Ms. Anker again on or about August 13th at which time she will have CBC, chemistries, LDH, magnesium, IgA level, vitamin B12 level and vitamin D level.  She will not be due for Zometa at that time, but will receive Velcade and a vitamin B12 shot.  She will receive Aranesp if her hemoglobin is less than or equal to 10.  The patient was started on Zometa in October 2011.  We will probably continue Zometa through 2013 and then consider stopping Zometa at that time.    ______________________________ Samul Dada, M.D. DSM/MEDQ  D:  11/01/2011  T:  11/01/2011  Job:  [id number]

## 2011-11-01 NOTE — Telephone Encounter (Signed)
Per staff message I have scheduled appts. [name]  

## 2011-11-01 NOTE — Progress Notes (Signed)
This office note has been dictated.  #[id number]

## 2011-11-04 ENCOUNTER — Other Ambulatory Visit: Payer: Self-pay | Admitting: *Deleted

## 2011-11-04 NOTE — Telephone Encounter (Signed)
Rx refill from [facility] for revlimid given to Dr Murinson's RN for review.  SLJ

## 2011-11-07 ENCOUNTER — Other Ambulatory Visit: Payer: Self-pay | Admitting: *Deleted

## 2011-11-07 ENCOUNTER — Telehealth: Payer: Self-pay | Admitting: *Deleted

## 2011-11-07 DIAGNOSIS — C9000 Multiple myeloma not having achieved remission: Secondary | ICD-10-CM

## 2011-11-07 MED ORDER — LENALIDOMIDE 10 MG PO CAPS: 10.0000 mg | ORAL_CAPSULE | ORAL | Status: DC

## 2011-11-07 NOTE — Telephone Encounter (Signed)
Collaborative nurse filled this medicine.  Triage received fax from [facility] confirming receipt of facsimile for this referral.

## 2011-11-07 NOTE — Telephone Encounter (Signed)
[facility] faxed Revlimid refill request.  Request to MD for review.  

## 2011-11-11 NOTE — Telephone Encounter (Signed)
[facility] faxed confirmation of prescription shipment.  Revlimid was shipped 11-10-2011 with next Business day delivery.

## 2011-11-15 ENCOUNTER — Ambulatory Visit: Payer: Medicare Other

## 2011-11-15 ENCOUNTER — Other Ambulatory Visit: Payer: Medicare Other | Admitting: Lab

## 2011-11-29 ENCOUNTER — Ambulatory Visit (HOSPITAL_BASED_OUTPATIENT_CLINIC_OR_DEPARTMENT_OTHER): Payer: Medicare Other

## 2011-11-29 ENCOUNTER — Other Ambulatory Visit (HOSPITAL_BASED_OUTPATIENT_CLINIC_OR_DEPARTMENT_OTHER): Payer: Medicare Other | Admitting: Lab

## 2011-11-29 VITALS — BP 95/62 | HR 73 | Temp 98.1°F

## 2011-11-29 DIAGNOSIS — D519 Vitamin B12 deficiency anemia, unspecified: Secondary | ICD-10-CM

## 2011-11-29 DIAGNOSIS — C9000 Multiple myeloma not having achieved remission: Secondary | ICD-10-CM

## 2011-11-29 DIAGNOSIS — D509 Iron deficiency anemia, unspecified: Secondary | ICD-10-CM

## 2011-11-29 DIAGNOSIS — I2510 Atherosclerotic heart disease of native coronary artery without angina pectoris: Secondary | ICD-10-CM

## 2011-11-29 DIAGNOSIS — Z5112 Encounter for antineoplastic immunotherapy: Secondary | ICD-10-CM

## 2011-11-29 DIAGNOSIS — E785 Hyperlipidemia, unspecified: Secondary | ICD-10-CM

## 2011-11-29 DIAGNOSIS — I10 Essential (primary) hypertension: Secondary | ICD-10-CM

## 2011-11-29 DIAGNOSIS — E612 Magnesium deficiency: Secondary | ICD-10-CM

## 2011-11-29 DIAGNOSIS — D518 Other vitamin B12 deficiency anemias: Secondary | ICD-10-CM

## 2011-11-29 DIAGNOSIS — E538 Deficiency of other specified B group vitamins: Secondary | ICD-10-CM

## 2011-11-29 LAB — CBC WITH DIFFERENTIAL/PLATELET
Basophils Absolute: 0 10*3/uL (ref 0.0–0.1)
Eosinophils Absolute: 0.1 10*3/uL (ref 0.0–0.5)
HGB: 10.8 g/dL — ABNORMAL LOW (ref 11.6–15.9)
MCV: 94.2 fL (ref 79.5–101.0)
MONO#: 0.7 10*3/uL (ref 0.1–0.9)
MONO%: 12.2 % (ref 0.0–14.0)
NEUT#: 3.1 10*3/uL (ref 1.5–6.5)
Platelets: 137 10*3/uL — ABNORMAL LOW (ref 145–400)
RDW: 16.1 % — ABNORMAL HIGH (ref 11.2–14.5)

## 2011-11-29 MED ORDER — BORTEZOMIB CHEMO SQ INJECTION 3.5 MG (2.5MG/ML)
1.3000 mg/m2 | Freq: Once | INTRAMUSCULAR | Status: AC
  Administered 2011-11-29: 2 mg via SUBCUTANEOUS
  Filled 2011-11-29: qty 2

## 2011-11-29 MED ORDER — ZOLEDRONIC ACID 4 MG/5ML IV CONC
3.5000 mg | Freq: Once | INTRAVENOUS | Status: AC
  Administered 2011-11-29: 3.5 mg via INTRAVENOUS
  Filled 2011-11-29: qty 4.38

## 2011-11-29 MED ORDER — ONDANSETRON HCL 8 MG PO TABS
8.0000 mg | ORAL_TABLET | Freq: Once | ORAL | Status: AC
  Administered 2011-11-29: 8 mg via ORAL

## 2011-11-29 MED ORDER — CYANOCOBALAMIN 1000 MCG/ML IJ SOLN
1000.0000 ug | Freq: Once | INTRAMUSCULAR | Status: AC
  Administered 2011-11-29: 1000 ug via INTRAMUSCULAR

## 2011-11-29 NOTE — Patient Instructions (Signed)
[facility] Discharge Instructions for Patients Receiving Chemotherapy  Today you received the following chemotherapy agents  Velcade/zometa  To help prevent nausea and vomiting after your treatment, we encourage you to take your nausea medication as prescribed. If you develop nausea and vomiting that is not controlled by your nausea medication, call the clinic. If it is after clinic hours your family physician or the after hours number for the clinic or go to the Emergency Department.   BELOW ARE SYMPTOMS THAT SHOULD BE REPORTED IMMEDIATELY:  *FEVER GREATER THAN 100.5 F  *CHILLS WITH OR WITHOUT FEVER  NAUSEA AND VOMITING THAT IS NOT CONTROLLED WITH YOUR NAUSEA MEDICATION  *UNUSUAL SHORTNESS OF BREATH  *UNUSUAL BRUISING OR BLEEDING  TENDERNESS IN MOUTH AND THROAT WITH OR WITHOUT PRESENCE OF ULCERS  *URINARY PROBLEMS  *BOWEL PROBLEMS  UNUSUAL RASH Items with * indicate a potential emergency and should be followed up as soon as possible.  One of the nurses will contact you 24 hours after your treatment. Please let the nurse know about any problems that you may have experienced. Feel free to call the clinic you have any questions or concerns. The clinic phone number is [phone].   I have been informed and understand all the instructions given to me. I know to contact the clinic, my physician, or go to the Emergency Department if any problems should occur. I do not have any questions at this time, but understand that I may call the clinic during office hours   should I have any questions or need assistance in obtaining follow up care.    __________________________________________  _____________  __________ Signature of Patient or Authorized Representative            Date                   Time    __________________________________________ Nurse's Signature

## 2011-12-01 ENCOUNTER — Other Ambulatory Visit: Payer: Self-pay | Admitting: *Deleted

## 2011-12-01 ENCOUNTER — Other Ambulatory Visit: Payer: Self-pay | Admitting: Nurse Practitioner

## 2011-12-01 DIAGNOSIS — C9000 Multiple myeloma not having achieved remission: Secondary | ICD-10-CM

## 2011-12-01 MED ORDER — LENALIDOMIDE 10 MG PO CAPS: 10.0000 mg | ORAL_CAPSULE | ORAL | Status: DC

## 2011-12-01 NOTE — Telephone Encounter (Signed)
THIS REFILL REQUEST FOR REVLIMID WAS GIVEN TO DR.MURINSON'S NURSE, KASIE MAHAN,RN.

## 2011-12-09 NOTE — Telephone Encounter (Signed)
RECEIVED A FAX FROM [facility] CONCERNING A CONFIRMATION OF PRESCRIPTION SHIPMENT FOR REVLIMID ON 12/08/11.

## 2011-12-13 ENCOUNTER — Ambulatory Visit: Payer: Medicare Other

## 2011-12-13 ENCOUNTER — Other Ambulatory Visit: Payer: Medicare Other | Admitting: Lab

## 2011-12-29 ENCOUNTER — Ambulatory Visit (HOSPITAL_BASED_OUTPATIENT_CLINIC_OR_DEPARTMENT_OTHER): Payer: Medicare Other | Admitting: Oncology

## 2011-12-29 ENCOUNTER — Other Ambulatory Visit: Payer: Self-pay | Admitting: Oncology

## 2011-12-29 ENCOUNTER — Telehealth: Payer: Self-pay | Admitting: Oncology

## 2011-12-29 ENCOUNTER — Other Ambulatory Visit (HOSPITAL_BASED_OUTPATIENT_CLINIC_OR_DEPARTMENT_OTHER): Payer: Medicare Other | Admitting: Lab

## 2011-12-29 ENCOUNTER — Encounter: Payer: Self-pay | Admitting: Oncology

## 2011-12-29 ENCOUNTER — Ambulatory Visit (HOSPITAL_BASED_OUTPATIENT_CLINIC_OR_DEPARTMENT_OTHER): Payer: Medicare Other

## 2011-12-29 ENCOUNTER — Other Ambulatory Visit: Payer: Self-pay | Admitting: *Deleted

## 2011-12-29 VITALS — BP 108/55 | HR 61 | Temp 98.9°F | Resp 20 | Ht 59.0 in | Wt 114.7 lb

## 2011-12-29 DIAGNOSIS — E785 Hyperlipidemia, unspecified: Secondary | ICD-10-CM

## 2011-12-29 DIAGNOSIS — C9000 Multiple myeloma not having achieved remission: Secondary | ICD-10-CM

## 2011-12-29 DIAGNOSIS — D519 Vitamin B12 deficiency anemia, unspecified: Secondary | ICD-10-CM

## 2011-12-29 DIAGNOSIS — E559 Vitamin D deficiency, unspecified: Secondary | ICD-10-CM

## 2011-12-29 DIAGNOSIS — I2510 Atherosclerotic heart disease of native coronary artery without angina pectoris: Secondary | ICD-10-CM

## 2011-12-29 DIAGNOSIS — E612 Magnesium deficiency: Secondary | ICD-10-CM

## 2011-12-29 DIAGNOSIS — D518 Other vitamin B12 deficiency anemias: Secondary | ICD-10-CM

## 2011-12-29 DIAGNOSIS — Z5112 Encounter for antineoplastic immunotherapy: Secondary | ICD-10-CM

## 2011-12-29 DIAGNOSIS — D509 Iron deficiency anemia, unspecified: Secondary | ICD-10-CM

## 2011-12-29 DIAGNOSIS — I10 Essential (primary) hypertension: Secondary | ICD-10-CM

## 2011-12-29 DIAGNOSIS — E538 Deficiency of other specified B group vitamins: Secondary | ICD-10-CM

## 2011-12-29 LAB — CBC WITH DIFFERENTIAL/PLATELET
BASO%: 0.8 % (ref 0.0–2.0)
Basophils Absolute: 0 10*3/uL (ref 0.0–0.1)
Eosinophils Absolute: 0.1 10*3/uL (ref 0.0–0.5)
HCT: 29.9 % — ABNORMAL LOW (ref 34.8–46.6)
HGB: 10 g/dL — ABNORMAL LOW (ref 11.6–15.9)
LYMPH%: 23.3 % (ref 14.0–49.7)
MCHC: 33.5 g/dL (ref 31.5–36.0)
MONO#: 0.6 10*3/uL (ref 0.1–0.9)
NEUT%: 60.4 % (ref 38.4–76.8)
Platelets: 145 10*3/uL (ref 145–400)
WBC: 4.5 10*3/uL (ref 3.9–10.3)

## 2011-12-29 MED ORDER — BORTEZOMIB CHEMO SQ INJECTION 3.5 MG (2.5MG/ML)
1.3000 mg/m2 | Freq: Once | INTRAMUSCULAR | Status: AC
  Administered 2011-12-29: 2 mg via SUBCUTANEOUS
  Filled 2011-12-29: qty 2

## 2011-12-29 MED ORDER — DARBEPOETIN ALFA-POLYSORBATE 500 MCG/ML IJ SOLN: 300.0000 ug | Freq: Once | INTRAMUSCULAR | Status: DC

## 2011-12-29 MED ORDER — ONDANSETRON HCL 8 MG PO TABS
8.0000 mg | ORAL_TABLET | Freq: Once | ORAL | Status: AC
  Administered 2011-12-29: 8 mg via ORAL

## 2011-12-29 MED ORDER — CYANOCOBALAMIN 1000 MCG/ML IJ SOLN
1000.0000 ug | Freq: Once | INTRAMUSCULAR | Status: AC
  Administered 2011-12-29: 1000 ug via INTRAMUSCULAR

## 2011-12-29 MED ORDER — LENALIDOMIDE 10 MG PO CAPS: 10.0000 mg | ORAL_CAPSULE | ORAL | Status: DC

## 2011-12-29 NOTE — Telephone Encounter (Signed)
THIS REFILL REQUEST FOR REVLIMID WAS GIVEN TO DR.MURINSON'S NURSE, ROBIN BASS,RN. 

## 2011-12-29 NOTE — Telephone Encounter (Signed)
gve the pt her sept/oct 2013 appt calendar °

## 2011-12-29 NOTE — Progress Notes (Signed)
CC:   [name], M.D.  PROBLEM LIST:  1. Multiple myeloma, initially presenting as an IgA kappa monoclonal  gammopathy in February 2009. 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 This was felt to be an adverse prognostic  determinant. Bone marrow on 08/31/2007 showed 46% plasma cells. A  repeat bone marrow on 08/05/2009 showed 73% plasma cells.  Treatment with Revlimid was started in April 2011. Zometa was  started in October 2011. We had previously started Aranesp for the  patient's anemia in May of 2010. Most recent metastatic bone  survey was on 08/05/2009. Other x-rays since then are available.  Maximum IgA level was 2080 on 08/11/2009. The lowest IgA level  following treatment was 489 on 10/05/2010.  Subcutaneous Velcade was added  to the patient's treatment program on 06/14/2011 because of her  rising IgA level 1080 on 06/02/2011. The patient is receiving  Velcade every 4 weeks in combination with Revlimid being given 10  mg 1 week on 1 week off, Zometa 3.5 mg IV every 2 months and  Aranesp 300 mcg subcu every 4 weeks for hemoglobin less than or  equal to 10.  2. Anemia secondary to vitamin B12 deficiency.  The patient had been on oral vitamin B12. However, her vitamin B12  level fell to 284 on 07/26/2011 and vitamin B12 shots 1000 mcg  given IM every month was resumed on 09/06/2011.  3. Anemia secondary to iron deficiency.  4. Vitamin D deficiency.  5. Magnesium deficiency.  6. Diabetes mellitus.  7. Hypertension.  8. Dyslipidemia.  9. Coronary artery disease.  10.Right anterior thigh mass, most likely a lipoma.  11.Multiple vertebral compression fractures as seen  on MRI of the lumbar spine on 08/27/2011. Kyphoplasty was carried out at L5, T11 and T12 on 10/07/2011 by Dr. Julieanne Cotton.    MEDICATIONS:  1. Aspirin 81 mg daily.  2. Lipitor 40 mg daily.  3. Coreg 50 mg daily.  4. Vitamin D 3000 international units daily.  5. Vitamin B12 1000 mcg IM  monthly, restarted on 09/06/11.  6. Vicodin 5/500 as needed.  7. Acyclovir 400 mg p.o. b.i.d..  8. Lisinopril 10 mg daily.  9. Magnesium oxide 400 mg 4 times a day.  10.Glucophage 500 mg twice a day.  11.Remeron 15 mg at bedtime.  12.Multivitamins daily.  13.Protonix 40 mg daily.  14.Compazine 10 mg as needed.  15.Aldactone 25 mg every other day as needed  16.Revlimid 10 mg daily 1 week on, 1 week off  17.Velcade 2 mg subcu every 2 weeks which was started on 06/14/2011.  Starting 11/01/2011 the interval was increased to every 4 weeks.  18.Aranesp 300 mcg subcu every 2 weeks for hemoglobin less than or  equal to 10. Starting 11/01/2011 we increased the interval to every 4 weeks.  19.Zometa 3.5 mg IV every 2 months. Zometa was started on 02/14/2010.    HISTORY:  I saw Christina Hopkins today for followup of her IgA kappa multiple myeloma.  Christina Hopkins was accompanied by her daughter.  She was last seen by [place] on 11/01/2011.  Currently she continues to do well. There has been no major change in her condition.  Back pain has improved since she underwent kyphoplasty by Dr. Corliss Skains on 10/07/2011.  Those procedures involved L5, T11 and T12.  The patient is on a multidrug program.  She receives subcutaneous Velcade every month.  She received this on her last visit on 06/18 and again on 07/16.  Also, on those dates she received intramuscular vitamin B12 one thousand micrograms.  Her last dose of Aranesp 300 mcg subcu was given on June 4th.  She has not required Aranesp since then.  Our cutoff is for hemoglobin of 10.  Christina Hopkins received Zometa on  July 16th, 3.5 mg IV.  She takes Revlimid 10 mg p.o. daily, 1 week on, 1 week off. This was started a couple of days ago on August 13th.  There has been no change in the patient's condition.  She gets around with a rolling walker.  There have been no falls.  She is planning to go to [facility] and then to [place], [place], sometime in the  next few weeks for vacation.  PHYSICAL EXAMINATION:  There is little change.  Weight is 114.7 pounds. Height 4 feet 11 inches.  Body surface area 1.47 sq m.  Blood pressure 108/55.  Other vital signs are normal.  There is no scleral icterus. Mouth and pharynx are benign.  No peripheral adenopathy palpable.  There are few rales at the bases.  Cardiac:  Regular rhythm without murmur or rub.  The patient has a marked dorsal kyphosis.  No Port-A-Cath or central catheter.  Abdomen remains distended and tympanitic without organomegaly or masses.  Abdomen:  Nontender.  Extremities.  No peripheral edema.  As stated, the patient is here today with her rolling walker.  When she was here 2 months ago she had a 4-pronged cane.  The patient has what appears to be a lipoma in the right lower proximal thigh.  Neurologic:  Nonfocal.  LABORATORY DATA:  Today, white count 4.5, ANC 2.7, hemoglobin 10.0, hematocrit 29.9, platelets 145,000.  Chemistries,  IgA level, vitamin B12 level and vitamin D level are all pending.  Chemistries from 11/01/2011 essentially normal.  Albumin was 3.8.  IgA level was 248, down from 1080 back on June 02, 2011.  Vitamin B12 level was 636 on 11/01/2011.  Vitamin D level on 09/20/2011 was 37.  IMAGING STUDIES:  1. Most recent metastatic bone survey was on 08/05/2009. Prior to  that, we had a metastatic bone survey from 11/03/2008 and  07/23/2007. The metastatic bone survey from 08/05/2009 stated that  there were no findings to strongly suggest metastatic disease.  There were multiple sites of degenerative change.  2. Chest x-ray from 02/09/2010 showed new or increased T6 and T7  compression fractures compared with the skeletal survey of  08/05/2009.  3. CT scan of head without IV contrast showed no acute findings. The  calvarium was intact. There was a probable old lacunar infarct in  the right basal ganglia.  4. There are x-rays of the left hip and lumbar spine from  07/16/2010.  There are x-rays of the left humerus and cervical spine from  06/30/2010.  5. An ultrasound of the right lower extremity was carried out on 08/18/2010  and did not show a discrete mass in the right thigh.  6. Screening mammogram from 02/01/2011 carried out at [facility] was negative.  7. MRI of the lumbar spine with and without IV contrast on 08/27/2011  showed acute/subacute superior endplate fracture of L5 with minimal  loss of vertebral body height and no significant retropulsion.  There was a nonhealed superior endplate compression fracture at T11  and T12 with associated kyphosis. There was central canal stenosis  greatest at L4-5, foraminal stenosis greatest at L2-3 and L3-4, and  nonhealed sacral insufficiency fractures. There was multilevel  spondylosis of the lumbar spine.  8. Kyphoplasty at L5, T11 and T12 carried out on 10/07/2011 by Dr.  Julieanne Cotton was carried out successfully. Biopsies showed  blood clot and hypercellular marrow with increased plasma cell  infiltrates at L5, T11 and T12.   IMPRESSION/PLAN:  Ms. Brau continues to do well.  IgA level has decreased nicely.  The patient will be due for Velcade 2 mg subcutaneously, vitamin B12 one thousand micrograms intramuscularly, and Aranesp today 300 mcg subcutaneously for her hemoglobin of 10.  The patient will continue on Revlimid 10 mg daily for 1 week and then 1 week off.  As stated, the patient's last dose of Zometa was back on 11/29/2011.  Mrs. Westervelt will come in in 1 month around September 12th, at which time she will have a CBC.  She will be due for Velcade and vitamin B12 shot, possibly Aranesp if her hemoglobin is less than or equal to 10.  We will plan to see her again in 2 months, which will be around October 10th, at which time we will check CBC, chemistries, IgA level and vitamin B12 level.  On that date she will be due for Velcade, vitamin B12 and Zometa 3.5 mg IV.  We  were planning to treat the patient for 2 years with Zometa, and that may be her last treatment since Zometa was started in October 2011.  The patient also will be due to have mammograms as her last mammograms were carried out on 02/01/2011.    ______________________________ Samul Dada, M.D. DSM/MEDQ  D:  12/29/2011  T:  12/29/2011  Job:  [id number]

## 2011-12-29 NOTE — Progress Notes (Signed)
This office note has been dictated.  #[id number]

## 2011-12-29 NOTE — Addendum Note (Signed)
Addended by: Arvilla Meres on: 12/29/2011 12:01 PM   Modules accepted: Orders

## 2011-12-29 NOTE — Patient Instructions (Addendum)
[facility] Discharge Instructions for Patients Receiving Chemotherapy  Today you received the following chemotherapy agents Velcade.  To help prevent nausea and vomiting after your treatment, we encourage you to take your nausea medication.   If you develop nausea and vomiting that is not controlled by your nausea medication, call the clinic. If it is after clinic hours your family physician or the after hours number for the clinic or go to the Emergency Department.   BELOW ARE SYMPTOMS THAT SHOULD BE REPORTED IMMEDIATELY:  *FEVER GREATER THAN 100.5 F  *CHILLS WITH OR WITHOUT FEVER  NAUSEA AND VOMITING THAT IS NOT CONTROLLED WITH YOUR NAUSEA MEDICATION  *UNUSUAL SHORTNESS OF BREATH  *UNUSUAL BRUISING OR BLEEDING  TENDERNESS IN MOUTH AND THROAT WITH OR WITHOUT PRESENCE OF ULCERS  *URINARY PROBLEMS  *BOWEL PROBLEMS  UNUSUAL RASH Items with * indicate a potential emergency and should be followed up as soon as possible.  One of the nurses will contact you 24 hours after your treatment. Please let the nurse know about any problems that you may have experienced. Feel free to call the clinic you have any questions or concerns. The clinic phone number is [phone].   I have been informed and understand all the instructions given to me. I know to contact the clinic, my physician, or go to the Emergency Department if any problems should occur. I do not have any questions at this time, but understand that I may call the clinic during office hours   should I have any questions or need assistance in obtaining follow up care.    __________________________________________  _____________  __________ Signature of Patient or Authorized Representative            Date                   Time    __________________________________________ Nurse's Signature    

## 2011-12-29 NOTE — Telephone Encounter (Signed)
RECEIVED A FAX FROM [employer] CONCERNING A CONFIRMATION OF FACSIMILE RECEIPT FOR PT.'S REFERRAL. 

## 2011-12-30 LAB — COMPREHENSIVE METABOLIC PANEL
BUN: 13 mg/dL (ref 6–23)
CO2: 24 mEq/L (ref 19–32)
Calcium: 8.6 mg/dL (ref 8.4–10.5)
Chloride: 97 mEq/L (ref 96–112)
Creatinine, Ser: 0.98 mg/dL (ref 0.50–1.10)
Glucose, Bld: 128 mg/dL — ABNORMAL HIGH (ref 70–99)
Total Bilirubin: 0.6 mg/dL (ref 0.3–1.2)

## 2011-12-30 LAB — MAGNESIUM: Magnesium: 1.6 mg/dL (ref 1.5–2.5)

## 2011-12-30 LAB — VITAMIN B12: Vitamin B-12: 780 pg/mL (ref 211–911)

## 2011-12-30 LAB — LACTATE DEHYDROGENASE: LDH: 121 U/L (ref 94–250)

## 2011-12-30 LAB — IGA: IgA: 261 mg/dL (ref 69–380)

## 2011-12-30 LAB — VITAMIN D 25 HYDROXY (VIT D DEFICIENCY, FRACTURES): Vit D, 25-Hydroxy: 35 ng/mL (ref 30–89)

## 2012-01-09 ENCOUNTER — Other Ambulatory Visit: Payer: Self-pay | Admitting: *Deleted

## 2012-01-09 NOTE — Telephone Encounter (Signed)
RECEIVED A FAX FROM [employer] CONCERNING A CONFIRMATION OF PRESCRIPTION SHIPMENT FOR REVLIMID ON 01/06/12.

## 2012-01-13 ENCOUNTER — Encounter: Payer: Self-pay | Admitting: Oncology

## 2012-01-13 ENCOUNTER — Telehealth: Payer: Self-pay

## 2012-01-13 NOTE — Progress Notes (Signed)
Put daughter's fmla paper on nurse's desk °

## 2012-01-13 NOTE — Telephone Encounter (Signed)
Daughter faxed FMLA papers. Forwarded to [employer]. She requests call when ready

## 2012-01-18 ENCOUNTER — Other Ambulatory Visit: Payer: Self-pay | Admitting: Medical Oncology

## 2012-01-18 MED ORDER — ACYCLOVIR 400 MG PO TABS: 400.0000 mg | ORAL_TABLET | Freq: Two times a day (BID) | ORAL | Status: DC

## 2012-01-19 ENCOUNTER — Other Ambulatory Visit: Payer: Self-pay | Admitting: *Deleted

## 2012-01-19 DIAGNOSIS — D649 Anemia, unspecified: Secondary | ICD-10-CM

## 2012-01-19 MED ORDER — MAGNESIUM 400 MG PO CAPS: 1.0000 | ORAL_CAPSULE | Freq: Four times a day (QID) | ORAL | Status: DC

## 2012-01-24 ENCOUNTER — Telehealth: Payer: Self-pay

## 2012-01-24 NOTE — Telephone Encounter (Signed)
refaxed FMLA papers to [phone]

## 2012-01-26 ENCOUNTER — Other Ambulatory Visit (HOSPITAL_BASED_OUTPATIENT_CLINIC_OR_DEPARTMENT_OTHER): Payer: Medicare Other | Admitting: Lab

## 2012-01-26 ENCOUNTER — Ambulatory Visit (HOSPITAL_BASED_OUTPATIENT_CLINIC_OR_DEPARTMENT_OTHER): Payer: Medicare Other

## 2012-01-26 VITALS — BP 93/54 | HR 68 | Temp 97.5°F

## 2012-01-26 DIAGNOSIS — C9000 Multiple myeloma not having achieved remission: Secondary | ICD-10-CM

## 2012-01-26 DIAGNOSIS — E785 Hyperlipidemia, unspecified: Secondary | ICD-10-CM

## 2012-01-26 DIAGNOSIS — I10 Essential (primary) hypertension: Secondary | ICD-10-CM

## 2012-01-26 DIAGNOSIS — D509 Iron deficiency anemia, unspecified: Secondary | ICD-10-CM

## 2012-01-26 DIAGNOSIS — E538 Deficiency of other specified B group vitamins: Secondary | ICD-10-CM

## 2012-01-26 DIAGNOSIS — D518 Other vitamin B12 deficiency anemias: Secondary | ICD-10-CM

## 2012-01-26 DIAGNOSIS — D519 Vitamin B12 deficiency anemia, unspecified: Secondary | ICD-10-CM

## 2012-01-26 DIAGNOSIS — I2510 Atherosclerotic heart disease of native coronary artery without angina pectoris: Secondary | ICD-10-CM

## 2012-01-26 DIAGNOSIS — D649 Anemia, unspecified: Secondary | ICD-10-CM

## 2012-01-26 DIAGNOSIS — Z5112 Encounter for antineoplastic immunotherapy: Secondary | ICD-10-CM

## 2012-01-26 DIAGNOSIS — E612 Magnesium deficiency: Secondary | ICD-10-CM

## 2012-01-26 LAB — CBC WITH DIFFERENTIAL/PLATELET
Basophils Absolute: 0 10*3/uL (ref 0.0–0.1)
EOS%: 2.1 % (ref 0.0–7.0)
Eosinophils Absolute: 0.1 10*3/uL (ref 0.0–0.5)
HGB: 9.5 g/dL — ABNORMAL LOW (ref 11.6–15.9)
MCH: 32.2 pg (ref 25.1–34.0)
MCV: 99.3 fL (ref 79.5–101.0)
MONO%: 15.3 % — ABNORMAL HIGH (ref 0.0–14.0)
NEUT#: 2.2 10*3/uL (ref 1.5–6.5)
RBC: 2.95 10*6/uL — ABNORMAL LOW (ref 3.70–5.45)
RDW: 15.8 % — ABNORMAL HIGH (ref 11.2–14.5)
lymph#: 1.3 10*3/uL (ref 0.9–3.3)

## 2012-01-26 MED ORDER — CYANOCOBALAMIN 1000 MCG/ML IJ SOLN
1000.0000 ug | Freq: Once | INTRAMUSCULAR | Status: AC
  Administered 2012-01-26: 1000 ug via INTRAMUSCULAR

## 2012-01-26 MED ORDER — BORTEZOMIB CHEMO SQ INJECTION 3.5 MG (2.5MG/ML)
1.3000 mg/m2 | Freq: Once | INTRAMUSCULAR | Status: AC
  Administered 2012-01-26: 2 mg via SUBCUTANEOUS
  Filled 2012-01-26: qty 2

## 2012-01-26 MED ORDER — ONDANSETRON HCL 8 MG PO TABS
8.0000 mg | ORAL_TABLET | Freq: Once | ORAL | Status: AC
  Administered 2012-01-26: 8 mg via ORAL

## 2012-01-26 MED ORDER — DARBEPOETIN ALFA-POLYSORBATE 500 MCG/ML IJ SOLN
300.0000 ug | Freq: Once | INTRAMUSCULAR | Status: AC
  Administered 2012-01-26: 300 ug via SUBCUTANEOUS
  Filled 2012-01-26: qty 1

## 2012-01-26 NOTE — Patient Instructions (Signed)
[facility] Discharge Instructions for Patients Receiving Chemotherapy  Today you received the following chemotherapy agents Velcade.  To help prevent nausea and vomiting after your treatment, we encourage you to take your nausea medication as prescribed.   If you develop nausea and vomiting that is not controlled by your nausea medication, call the clinic. If it is after clinic hours your family physician or the after hours number for the clinic or go to the Emergency Department.   BELOW ARE SYMPTOMS THAT SHOULD BE REPORTED IMMEDIATELY:  *FEVER GREATER THAN 100.5 F  *CHILLS WITH OR WITHOUT FEVER  NAUSEA AND VOMITING THAT IS NOT CONTROLLED WITH YOUR NAUSEA MEDICATION  *UNUSUAL SHORTNESS OF BREATH  *UNUSUAL BRUISING OR BLEEDING  TENDERNESS IN MOUTH AND THROAT WITH OR WITHOUT PRESENCE OF ULCERS  *URINARY PROBLEMS  *BOWEL PROBLEMS  UNUSUAL RASH Items with * indicate a potential emergency and should be followed up as soon as possible.  One of the nurses will contact you 24 hours after your treatment. Please let the nurse know about any problems that you may have experienced. Feel free to call the clinic you have any questions or concerns. The clinic phone number is [phone].   I have been informed and understand all the instructions given to me. I know to contact the clinic, my physician, or go to the Emergency Department if any problems should occur. I do not have any questions at this time, but understand that I may call the clinic during office hours   should I have any questions or need assistance in obtaining follow up care.    __________________________________________  _____________  __________ Signature of Patient or Authorized Representative            Date                   Time    __________________________________________ Nurse's Signature    

## 2012-02-01 ENCOUNTER — Other Ambulatory Visit: Payer: Self-pay | Admitting: *Deleted

## 2012-02-01 DIAGNOSIS — C9000 Multiple myeloma not having achieved remission: Secondary | ICD-10-CM

## 2012-02-01 MED ORDER — LENALIDOMIDE 10 MG PO CAPS: 10.0000 mg | ORAL_CAPSULE | ORAL | Status: DC

## 2012-02-01 NOTE — Telephone Encounter (Signed)
[employer] faxed confirmation of facsimile receipt for Revlimid.  Insurance will be verified and delivery arrangements made with pt.

## 2012-02-06 NOTE — Telephone Encounter (Signed)
RECEIVED A FAX FROM [employer] CONCERNING A CONFIRMATION OF PRESCRIPTION SHIPMENT FOR REVLIMID ON 02/03/12.

## 2012-02-23 ENCOUNTER — Other Ambulatory Visit (HOSPITAL_BASED_OUTPATIENT_CLINIC_OR_DEPARTMENT_OTHER): Payer: Medicare Other | Admitting: Lab

## 2012-02-23 ENCOUNTER — Ambulatory Visit (HOSPITAL_BASED_OUTPATIENT_CLINIC_OR_DEPARTMENT_OTHER): Payer: Medicare Other

## 2012-02-23 ENCOUNTER — Telehealth: Payer: Self-pay | Admitting: *Deleted

## 2012-02-23 ENCOUNTER — Encounter: Payer: Self-pay | Admitting: Oncology

## 2012-02-23 ENCOUNTER — Ambulatory Visit (HOSPITAL_BASED_OUTPATIENT_CLINIC_OR_DEPARTMENT_OTHER): Payer: Medicare Other | Admitting: Oncology

## 2012-02-23 VITALS — BP 92/50 | HR 67 | Temp 97.1°F | Resp 20 | Ht 59.0 in | Wt 111.6 lb

## 2012-02-23 DIAGNOSIS — C9000 Multiple myeloma not having achieved remission: Secondary | ICD-10-CM

## 2012-02-23 DIAGNOSIS — D519 Vitamin B12 deficiency anemia, unspecified: Secondary | ICD-10-CM

## 2012-02-23 DIAGNOSIS — I10 Essential (primary) hypertension: Secondary | ICD-10-CM

## 2012-02-23 DIAGNOSIS — E559 Vitamin D deficiency, unspecified: Secondary | ICD-10-CM

## 2012-02-23 DIAGNOSIS — E538 Deficiency of other specified B group vitamins: Secondary | ICD-10-CM

## 2012-02-23 DIAGNOSIS — I2510 Atherosclerotic heart disease of native coronary artery without angina pectoris: Secondary | ICD-10-CM

## 2012-02-23 DIAGNOSIS — Z5112 Encounter for antineoplastic immunotherapy: Secondary | ICD-10-CM

## 2012-02-23 DIAGNOSIS — E785 Hyperlipidemia, unspecified: Secondary | ICD-10-CM

## 2012-02-23 DIAGNOSIS — D509 Iron deficiency anemia, unspecified: Secondary | ICD-10-CM

## 2012-02-23 DIAGNOSIS — E612 Magnesium deficiency: Secondary | ICD-10-CM

## 2012-02-23 DIAGNOSIS — D518 Other vitamin B12 deficiency anemias: Secondary | ICD-10-CM

## 2012-02-23 LAB — COMPREHENSIVE METABOLIC PANEL (CC13)
ALT: 17 U/L (ref 0–55)
AST: 17 U/L (ref 5–34)
Creatinine: 0.9 mg/dL (ref 0.6–1.1)
Total Bilirubin: 0.5 mg/dL (ref 0.20–1.20)

## 2012-02-23 LAB — CBC WITH DIFFERENTIAL/PLATELET
BASO%: 1.1 % (ref 0.0–2.0)
EOS%: 2.1 % (ref 0.0–7.0)
HCT: 33.7 % — ABNORMAL LOW (ref 34.8–46.6)
LYMPH%: 29.5 % (ref 14.0–49.7)
MCH: 32.6 pg (ref 25.1–34.0)
MCHC: 32.6 g/dL (ref 31.5–36.0)
MCV: 100 fL (ref 79.5–101.0)
NEUT%: 52.7 % (ref 38.4–76.8)
Platelets: 138 10*3/uL — ABNORMAL LOW (ref 145–400)

## 2012-02-23 LAB — LACTATE DEHYDROGENASE (CC13): LDH: 129 U/L (ref 125–220)

## 2012-02-23 MED ORDER — CYANOCOBALAMIN 1000 MCG/ML IJ SOLN
1000.0000 ug | Freq: Once | INTRAMUSCULAR | Status: AC
  Administered 2012-02-23: 1000 ug via INTRAMUSCULAR

## 2012-02-23 MED ORDER — BORTEZOMIB CHEMO SQ INJECTION 3.5 MG (2.5MG/ML)
1.3000 mg/m2 | Freq: Once | INTRAMUSCULAR | Status: AC
  Administered 2012-02-23: 2 mg via SUBCUTANEOUS
  Filled 2012-02-23: qty 2

## 2012-02-23 MED ORDER — ZOLEDRONIC ACID 4 MG/5ML IV CONC
3.5000 mg | Freq: Once | INTRAVENOUS | Status: AC
  Administered 2012-02-23: 3.5 mg via INTRAVENOUS
  Filled 2012-02-23: qty 4.38

## 2012-02-23 MED ORDER — ONDANSETRON HCL 8 MG PO TABS
8.0000 mg | ORAL_TABLET | Freq: Once | ORAL | Status: AC
  Administered 2012-02-23: 8 mg via ORAL

## 2012-02-23 NOTE — Patient Instructions (Addendum)
[facility] Discharge Instructions for Patients Receiving Chemotherapy  Today you received the following chemotherapy agents Velcade, Zometa and Vit B12  To help prevent nausea and vomiting after your treatment, we encourage you to take your nausea medication as perescribed.    If you develop nausea and vomiting that is not controlled by your nausea medication, call the clinic. If it is after clinic hours your family physician or the after hours number for the clinic or go to the Emergency Department.   BELOW ARE SYMPTOMS THAT SHOULD BE REPORTED IMMEDIATELY:  *FEVER GREATER THAN 100.5 F  *CHILLS WITH OR WITHOUT FEVER  NAUSEA AND VOMITING THAT IS NOT CONTROLLED WITH YOUR NAUSEA MEDICATION  *UNUSUAL SHORTNESS OF BREATH  *UNUSUAL BRUISING OR BLEEDING  TENDERNESS IN MOUTH AND THROAT WITH OR WITHOUT PRESENCE OF ULCERS  *URINARY PROBLEMS  *BOWEL PROBLEMS  UNUSUAL RASH Items with * indicate a potential emergency and should be followed up as soon as possible.  One of the nurses will contact you 24 hours after your treatment. Please let the nurse know about any problems that you may have experienced. Feel free to call the clinic you have any questions or concerns. The clinic phone number is [phone].   I have been informed and understand all the instructions given to me. I know to contact the clinic, my physician, or go to the Emergency Department if any problems should occur. I do not have any questions at this time, but understand that I may call the clinic during office hours   should I have any questions or need assistance in obtaining follow up care.    __________________________________________  _____________  __________ Signature of Patient or Authorized Representative            Date                   Time    __________________________________________ Nurse's Signature

## 2012-02-23 NOTE — Telephone Encounter (Signed)
Per staff message and POF I have scheduled appts.  [name]  

## 2012-02-23 NOTE — Progress Notes (Signed)
This office note has been dictated.  #[id number]

## 2012-02-23 NOTE — Patient Instructions (Signed)
Continue Revlimid 10 mg daily 1 week on and 1 week off.  Double your dose of vitamin D.  Next labs and chemo--11/7 and next appointment with me will be 04/19/12.

## 2012-02-23 NOTE — Progress Notes (Signed)
CC:   [name], M.D.   PROBLEM LIST:  1. Multiple myeloma, initially presenting as an IgA kappa monoclonal  gammopathy in February 2009. 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 This was felt to be an adverse prognostic  determinant. Bone marrow on 08/31/2007 showed 46% plasma cells. A  repeat bone marrow on 08/05/2009 showed 73% plasma cells.  Treatment with Revlimid was started in April 2011. Zometa was  started in October 2011. We had previously started Aranesp for the  patient's anemia in May of 2010. Most recent metastatic bone  survey was on 08/05/2009. Other x-rays since then are available.  Maximum IgA level was 2080 on 08/11/2009.   Subcutaneous Velcade was added to the patient's treatment program on 06/14/2011 because of a rising IgA level 1080 on 06/02/2011. The patient is now receiving  Velcade every 4 weeks in combination with Revlimid being given 10  mg 1 week on 1 week off, Zometa 3.5 mg IV every 2 months and  Aranesp 300 mcg subcu every 4 weeks for hemoglobin less than or  equal to 10.  2. Anemia secondary to vitamin B12 deficiency.  The patient had been on oral vitamin B12. However, her vitamin B12  level fell to 284 on 07/26/2011 and vitamin B12 shots 1000 mcg  given IM every month was resumed on 09/06/2011.  3. Anemia secondary to iron deficiency.  4. Vitamin D deficiency.  5. Magnesium deficiency.  6. Diabetes mellitus.  7. Hypertension.  8. Dyslipidemia.  9. Coronary artery disease.  10.Right anterior thigh mass, most likely a lipoma.  11.Multiple vertebral compression fractures as seen  on MRI of the lumbar spine on 08/27/2011. Kyphoplasty was carried out at L5, T11 and T12 on 10/07/2011 by Dr. Julieanne Cotton.     MEDICATIONS:  1. Aspirin 81 mg daily.  2. Lipitor 40 mg daily.  3. Coreg 25 mg twice daily.  4. Vitamin D to be increased to 6000 international units daily.  5. Aldactone 25 mg every other day.  6. Vicodin 5/500 as  needed.  7. Acyclovir 400 mg p.o. b.i.d..  8. Lisinopril 10 mg daily.  9. Magnesium oxide 400 mg 4 times a day.  10.Glucophage 500 mg twice a day.  11.Remeron 15 mg at bedtime.  12.Multivitamins to be increased to 2 pills daily.  13.Protonix 40 mg daily.  14.Compazine 10 mg as needed.  15.Vitamin B12 1000 mcg IM monthly, restarted on 09/06/11. 16.Revlimid 10 mg daily 1 week on, 1 week off  17.Velcade 2 mg subcu every 4 weeks. Velcade was started on 06/14/2011.  18.Aranesp 300 mcg subcu every 4 weeks for hemoglobin less than or  equal to 10.  19.Zometa 3.5 mg IV every 2 months. Zometa was started on 02/14/2010.   IMMUNIZATION HISTORY:  Reviewed.  We need to check on whether the patient has had Pneumovax.  Flu shots were given on 02/09/2011  and 01/27/2012.  SMOKING HISTORY:  The patient has never smoked cigarettes.   HISTORY:  Christina Hopkins was seen today for followup of her IgA kappa multiple myeloma.  Christina Hopkins was accompanied by her daughter [name].  Christina Hopkins was last seen by [place] on 12/29/2011.  There have been no major changes in her condition other than some weight loss.  Her appetite is good.  She denies any difficulty eating.  She eats small portions.  Her low back pain is better.  She continues to have some pain in between her shoulder blades.  This is not new.  The patient was able to go to [facility] and fly to [place] in recent weeks.  Christina Hopkins takes Revlimid 10 mg daily 1 week on, 1 week off.  She will be concluding this course of Revlimid on Monday, October 14th, and resuming it on Tuesday, October 22nd.  She continues to receive Velcade 2 mg subcu every 4 weeks.  Velcade was last given on 01/26/2012.  The patient also has been receiving Zometa 3.5 mg every 2 months.  She has received this for 2 years now and today's dose will be her last dose of Zometa that we have planned.  Lastly, the patient receives Aranesp 300 mcg subcu every 4 weeks as needed for hemoglobin  less than or equal to 10. On 01/26/2012, hemoglobin was 9.5 and the patient did receive 300 mcg subcu of Aranesp.  As stated, her condition remains fairly stable except for the ongoing weight loss.  The patient gets around with a rolling walker.  PHYSICAL EXAMINATION:  Weight today is 111.6 pounds, height 4 feet 11 inches, body surface area 1.45 sq m.  Blood pressure today 92/50.  Other vital signs are normal.  There is no scleral icterus.  Mouth and pharynx are benign.  No peripheral adenopathy palpable.  Heart and lungs were normal.  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  The patient does not have a Port-A-Cath or central catheter.  Abdomen, with the patient sitting, is benign.  Extremities:  No peripheral edema or clubbing.  She is here today with a rolling walker.  The patient has a lipoma in her right lower proximal thigh which is stable.  Neurologic:  Nonfocal.  LABORATORY DATA:  Today, white count 4.4, ANC 2.3 hemoglobin 11.0, hematocrit 33.7, platelets 138,000.  Chemistries today sodium 134, glucose 138, BUN 13.0, creatinine 0.9, albumin 3.4.  Vitamin B12 and IgA level are pending.  On 12/29/2011  vitamin D level was 35, vitamin B12 level 780 and the IgA level was 261.  IMPRESSION AND PLAN:  As stated above, the patient continues on Revlimid 10 mg daily, 2 weeks on 2 weeks off.  She will conclude this course of Revlimid on Monday, October 14th.  She is due for Velcade today 2 mg subcu.  We continue this every 4 weeks.  She will also be receiving Zometa 3.5 mg IV.  This will be her last dose of Zometa that we have planned.  The patient does not require Aranesp today as her hemoglobin is 11.0.  In view of the borderline low vitamin D level, I have instructed the patient to double her vitamin D from about 3500 units daily to 7000 units daily.  The patient will come in on November 7th for CBC and another dose  of subcutaneous Velcade as well as a vitamin B12 shot 1000 mcg IM.  I should mention that she is getting a vitamin B12 shot today as well. She gets this every month with her Velcade.  We will plan to see Christina. Harter again in 2 months, which should be around December 5th, at which time we will check CBC, chemistries, LDH, vitamin D level, vitamin B12 level and an IgA level.  In looking through the patient's labs, her IgA level back in the spring of 2011 before we started her on Revlimid was over 2000.    ______________________________ Samul Dada, M.D. DSM/MEDQ  D:  02/23/2012  T:  02/23/2012  Job:  [id number]

## 2012-02-27 ENCOUNTER — Other Ambulatory Visit: Payer: Self-pay | Admitting: *Deleted

## 2012-02-27 DIAGNOSIS — C9000 Multiple myeloma not having achieved remission: Secondary | ICD-10-CM

## 2012-02-27 MED ORDER — LENALIDOMIDE 10 MG PO CAPS: 10.0000 mg | ORAL_CAPSULE | ORAL | Status: DC

## 2012-02-27 NOTE — Telephone Encounter (Signed)
RECEIVED A FAX FROM [employer] CONCERNING A CONFIRMATION OF FACSIMILE RECEIPT FOR PT.'S REFERRAL. 

## 2012-02-27 NOTE — Telephone Encounter (Signed)
THIS REFILL REQUEST FOR REVLIMID WAS GIVEN TO DR.MURINSON'S NURSE, ROBIN BASS,RN. 

## 2012-02-27 NOTE — Addendum Note (Signed)
Addended by: Arvilla Meres on: 02/27/2012 12:37 PM   Modules accepted: Orders

## 2012-03-02 NOTE — Telephone Encounter (Signed)
RECEIVED A FAX FROM [employer] CONCERNING A CONFIRMATION OF PRESCRIPTION SHIPMENT FOR REVLIMID ON 03/01/12.

## 2012-03-22 ENCOUNTER — Ambulatory Visit (HOSPITAL_BASED_OUTPATIENT_CLINIC_OR_DEPARTMENT_OTHER): Payer: Medicare Other

## 2012-03-22 ENCOUNTER — Other Ambulatory Visit (HOSPITAL_BASED_OUTPATIENT_CLINIC_OR_DEPARTMENT_OTHER): Payer: Medicare Other | Admitting: Lab

## 2012-03-22 VITALS — BP 100/60 | HR 81 | Temp 97.9°F

## 2012-03-22 DIAGNOSIS — Z5112 Encounter for antineoplastic immunotherapy: Secondary | ICD-10-CM

## 2012-03-22 DIAGNOSIS — C9000 Multiple myeloma not having achieved remission: Secondary | ICD-10-CM

## 2012-03-22 DIAGNOSIS — D519 Vitamin B12 deficiency anemia, unspecified: Secondary | ICD-10-CM

## 2012-03-22 DIAGNOSIS — E538 Deficiency of other specified B group vitamins: Secondary | ICD-10-CM

## 2012-03-22 LAB — CBC WITH DIFFERENTIAL/PLATELET
Basophils Absolute: 0 10*3/uL (ref 0.0–0.1)
EOS%: 3.2 % (ref 0.0–7.0)
HCT: 32.3 % — ABNORMAL LOW (ref 34.8–46.6)
HGB: 10.6 g/dL — ABNORMAL LOW (ref 11.6–15.9)
MCH: 32 pg (ref 25.1–34.0)
MCHC: 32.8 g/dL (ref 31.5–36.0)
MCV: 97.6 fL (ref 79.5–101.0)
MONO%: 16.4 % — ABNORMAL HIGH (ref 0.0–14.0)
NEUT%: 48.1 % (ref 38.4–76.8)
RDW: 15.1 % — ABNORMAL HIGH (ref 11.2–14.5)

## 2012-03-22 MED ORDER — CYANOCOBALAMIN 1000 MCG/ML IJ SOLN
1000.0000 ug | Freq: Once | INTRAMUSCULAR | Status: AC
  Administered 2012-03-22: 1000 ug via INTRAMUSCULAR

## 2012-03-22 MED ORDER — ONDANSETRON HCL 8 MG PO TABS
8.0000 mg | ORAL_TABLET | Freq: Once | ORAL | Status: AC
  Administered 2012-03-22: 8 mg via ORAL

## 2012-03-22 MED ORDER — BORTEZOMIB CHEMO SQ INJECTION 3.5 MG (2.5MG/ML)
1.3000 mg/m2 | Freq: Once | INTRAMUSCULAR | Status: AC
  Administered 2012-03-22: 2 mg via SUBCUTANEOUS
  Filled 2012-03-22: qty 2

## 2012-03-22 NOTE — Patient Instructions (Signed)
[facility] Discharge Instructions for Patients Receiving Chemotherapy  Today you received the following chemotherapy agents Velcade  To help prevent nausea and vomiting after your treatment, we encourage you to take your nausea medication as prescribed.   If you develop nausea and vomiting that is not controlled by your nausea medication, call the clinic. If it is after clinic hours your family physician or the after hours number for the clinic or go to the Emergency Department.   BELOW ARE SYMPTOMS THAT SHOULD BE REPORTED IMMEDIATELY:  *FEVER GREATER THAN 100.5 F  *CHILLS WITH OR WITHOUT FEVER  NAUSEA AND VOMITING THAT IS NOT CONTROLLED WITH YOUR NAUSEA MEDICATION  *UNUSUAL SHORTNESS OF BREATH  *UNUSUAL BRUISING OR BLEEDING  TENDERNESS IN MOUTH AND THROAT WITH OR WITHOUT PRESENCE OF ULCERS  *URINARY PROBLEMS  *BOWEL PROBLEMS  UNUSUAL RASH Items with * indicate a potential emergency and should be followed up as soon as possible.  Feel free to call the clinic you have any questions or concerns. The clinic phone number is [phone].   I have been informed and understand all the instructions given to me. I know to contact the clinic, my physician, or go to the Emergency Department if any problems should occur. I do not have any questions at this time, but understand that I may call the clinic during office hours   should I have any questions or need assistance in obtaining follow up care.    

## 2012-03-26 ENCOUNTER — Other Ambulatory Visit: Payer: Self-pay | Admitting: *Deleted

## 2012-03-26 ENCOUNTER — Other Ambulatory Visit: Payer: Self-pay | Admitting: Nurse Practitioner

## 2012-03-26 DIAGNOSIS — C9000 Multiple myeloma not having achieved remission: Secondary | ICD-10-CM

## 2012-03-26 MED ORDER — LENALIDOMIDE 10 MG PO CAPS: 10.0000 mg | ORAL_CAPSULE | ORAL | Status: DC

## 2012-03-26 NOTE — Telephone Encounter (Signed)
THIS REFILL REQUEST FOR REVLIMID WAS GIVEN TO DR.MURINSON'S NURSE, ROBIN BASS,RN. 

## 2012-03-26 NOTE — Telephone Encounter (Signed)
RECEIVED A FAX FROM [employer] CONCERNING A CONFIRMATION OF FACSIMILE RECEIPT FOR PT.'S REFERRAL. 

## 2012-03-29 NOTE — Telephone Encounter (Signed)
[facility] faxed confirmation of prescription shipment.  Revlimid shipped 03-28-2012 with next business day delivery. 

## 2012-04-19 ENCOUNTER — Other Ambulatory Visit (HOSPITAL_BASED_OUTPATIENT_CLINIC_OR_DEPARTMENT_OTHER): Payer: Medicare Other | Admitting: Lab

## 2012-04-19 ENCOUNTER — Ambulatory Visit (HOSPITAL_BASED_OUTPATIENT_CLINIC_OR_DEPARTMENT_OTHER): Payer: Medicare Other | Admitting: Oncology

## 2012-04-19 ENCOUNTER — Other Ambulatory Visit: Payer: Self-pay | Admitting: Medical Oncology

## 2012-04-19 ENCOUNTER — Ambulatory Visit (HOSPITAL_BASED_OUTPATIENT_CLINIC_OR_DEPARTMENT_OTHER): Payer: Medicare Other

## 2012-04-19 ENCOUNTER — Telehealth: Payer: Self-pay | Admitting: *Deleted

## 2012-04-19 ENCOUNTER — Telehealth: Payer: Self-pay | Admitting: Medical Oncology

## 2012-04-19 ENCOUNTER — Encounter: Payer: Self-pay | Admitting: Oncology

## 2012-04-19 VITALS — BP 107/51 | HR 85 | Temp 98.2°F | Resp 18 | Ht 59.0 in | Wt 113.0 lb

## 2012-04-19 DIAGNOSIS — D630 Anemia in neoplastic disease: Secondary | ICD-10-CM

## 2012-04-19 DIAGNOSIS — E559 Vitamin D deficiency, unspecified: Secondary | ICD-10-CM

## 2012-04-19 DIAGNOSIS — D509 Iron deficiency anemia, unspecified: Secondary | ICD-10-CM

## 2012-04-19 DIAGNOSIS — E538 Deficiency of other specified B group vitamins: Secondary | ICD-10-CM

## 2012-04-19 DIAGNOSIS — C9000 Multiple myeloma not having achieved remission: Secondary | ICD-10-CM

## 2012-04-19 DIAGNOSIS — D518 Other vitamin B12 deficiency anemias: Secondary | ICD-10-CM

## 2012-04-19 DIAGNOSIS — D519 Vitamin B12 deficiency anemia, unspecified: Secondary | ICD-10-CM

## 2012-04-19 DIAGNOSIS — Z5112 Encounter for antineoplastic immunotherapy: Secondary | ICD-10-CM

## 2012-04-19 LAB — COMPREHENSIVE METABOLIC PANEL (CC13)
ALT: 16 U/L (ref 0–55)
Alkaline Phosphatase: 59 U/L (ref 40–150)
Sodium: 139 mEq/L (ref 136–145)
Total Bilirubin: 0.44 mg/dL (ref 0.20–1.20)
Total Protein: 6.9 g/dL (ref 6.4–8.3)

## 2012-04-19 LAB — CBC WITH DIFFERENTIAL/PLATELET
BASO%: 1.2 % (ref 0.0–2.0)
EOS%: 2.9 % (ref 0.0–7.0)
LYMPH%: 30.6 % (ref 14.0–49.7)
MCHC: 33.5 g/dL (ref 31.5–36.0)
MCV: 102.9 fL — ABNORMAL HIGH (ref 79.5–101.0)
MONO%: 13.5 % (ref 0.0–14.0)
Platelets: 131 10*3/uL — ABNORMAL LOW (ref 145–400)
RBC: 3.23 10*6/uL — ABNORMAL LOW (ref 3.70–5.45)

## 2012-04-19 MED ORDER — ONDANSETRON HCL 8 MG PO TABS
8.0000 mg | ORAL_TABLET | Freq: Once | ORAL | Status: AC
  Administered 2012-04-19: 8 mg via ORAL

## 2012-04-19 MED ORDER — BORTEZOMIB CHEMO SQ INJECTION 3.5 MG (2.5MG/ML)
1.3000 mg/m2 | Freq: Once | INTRAMUSCULAR | Status: AC
  Administered 2012-04-19: 2 mg via SUBCUTANEOUS
  Filled 2012-04-19: qty 2

## 2012-04-19 MED ORDER — HYDROCODONE-ACETAMINOPHEN 10-325 MG PO TABS: ORAL_TABLET | ORAL | Status: DC

## 2012-04-19 MED ORDER — CYANOCOBALAMIN 1000 MCG/ML IJ SOLN
1000.0000 ug | Freq: Once | INTRAMUSCULAR | Status: AC
  Administered 2012-04-19: 1000 ug via INTRAMUSCULAR

## 2012-04-19 NOTE — Telephone Encounter (Signed)
gv and printed appt schedule for Jan 2014....emailed michelle to add chemo..the patient aware °

## 2012-04-19 NOTE — Telephone Encounter (Signed)
Per staff message and POF I have scheduled appt.  [name]  

## 2012-04-19 NOTE — Progress Notes (Signed)
This office note has been dictated.  #[id number]

## 2012-04-19 NOTE — Patient Instructions (Signed)
[facility] Discharge Instructions for Patients Receiving Chemotherapy  Today you received the following chemotherapy agents Velcade To help prevent nausea and vomiting after your treatment, we encourage you to take your nausea medication as prescribed.  If you develop nausea and vomiting that is not controlled by your nausea medication, call the clinic. If it is after clinic hours your family physician or the after hours number for the clinic or go to the Emergency Department.   BELOW ARE SYMPTOMS THAT SHOULD BE REPORTED IMMEDIATELY:  *FEVER GREATER THAN 100.5 F  *CHILLS WITH OR WITHOUT FEVER  NAUSEA AND VOMITING THAT IS NOT CONTROLLED WITH YOUR NAUSEA MEDICATION  *UNUSUAL SHORTNESS OF BREATH  *UNUSUAL BRUISING OR BLEEDING  TENDERNESS IN MOUTH AND THROAT WITH OR WITHOUT PRESENCE OF ULCERS  *URINARY PROBLEMS  *BOWEL PROBLEMS  UNUSUAL RASH Items with * indicate a potential emergency and should be followed up as soon as possible.  One of the nurses will contact you 24 hours after your treatment. Please let the nurse know about any problems that you may have experienced. Feel free to call the clinic you have any questions or concerns. The clinic phone number is [phone].   I have been informed and understand all the instructions given to me. I know to contact the clinic, my physician, or go to the Emergency Department if any problems should occur. I do not have any questions at this time, but understand that I may call the clinic during office hours   should I have any questions or need assistance in obtaining follow up care.    __________________________________________  _____________  __________ Signature of Patient or Authorized Representative            Date                   Time    __________________________________________ Nurse's Signature  Cyanocobalamin, Vitamin B12 injection What is this medicine? CYANOCOBALAMIN (sye an oh koe BAL a min) is a  man made form of vitamin B12. Vitamin B12 is used in the growth of healthy blood cells, nerve cells, and proteins in the body. It also helps with the metabolism of fats and carbohydrates. This medicine is used to treat people who can not absorb vitamin B12. This medicine may be used for other purposes; ask your health care provider or pharmacist if you have questions. What should I tell my health care provider before I take this medicine? They need to know if you have any of these conditions: -kidney disease -Leber's disease -megaloblastic anemia -an unusual or allergic reaction to cyanocobalamin, cobalt, other medicines, foods, dyes, or preservatives -pregnant or trying to get pregnant -breast-feeding How should I use this medicine? This medicine is injected into a muscle or deeply under the skin. It is usually given by a health care professional in a clinic or doctor's office. However, your doctor may teach you how to inject yourself. Follow all instructions. Talk to your pediatrician regarding the use of this medicine in children. Special care may be needed. Overdosage: If you think you have taken too much of this medicine contact a poison control center or emergency room at once. NOTE: This medicine is only for you. Do not share this medicine with others. What if I miss a dose? If you are given your dose at a clinic or doctor's office, call to reschedule your appointment. If you give your own injections and you miss a dose, take it as soon as you  can. If it is almost time for your next dose, take only that dose. Do not take double or extra doses. What may interact with this medicine? -colchicine -heavy alcohol intake This list may not describe all possible interactions. Give your health care provider a list of all the medicines, herbs, non-prescription drugs, or dietary supplements you use. Also tell them if you smoke, drink alcohol, or use illegal drugs. Some items may interact with your  medicine. What should I watch for while using this medicine? Visit your doctor or health care professional regularly. You may need blood work done while you are taking this medicine. You may need to follow a special diet. Talk to your doctor. Limit your alcohol intake and avoid smoking to get the best benefit. What side effects may I notice from receiving this medicine? Side effects that you should report to your doctor or health care professional as soon as possible: -allergic reactions like skin rash, itching or hives, swelling of the face, lips, or tongue -blue tint to skin -chest tightness, pain -difficulty breathing, wheezing -dizziness -red, swollen painful area on the leg Side effects that usually do not require medical attention (report to your doctor or health care professional if they continue or are bothersome): -diarrhea -headache This list may not describe all possible side effects. Call your doctor for medical advice about side effects. You may report side effects to FDA at 1-800-FDA-1088. Where should I keep my medicine? Keep out of the reach of children. Store at room temperature between 15 and 30 degrees C (59 and 85 degrees F). Protect from light. Throw away any unused medicine after the expiration date. NOTE: This sheet is a summary. It may not cover all possible information. If you have questions about this medicine, talk to your doctor, pharmacist, or health care provider.  2013, [employer]. (08/13/2007 10:10:20 PM)

## 2012-04-20 LAB — VITAMIN B12: Vitamin B-12: 531 pg/mL (ref 211–911)

## 2012-04-20 NOTE — Progress Notes (Signed)
CC:   Christina Hopkins, M.D.  PROBLEM LIST:  1. Multiple myeloma, initially presenting as an IgA kappa monoclonal  gammopathy in February 2009. 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 This was felt to be an adverse prognostic  determinant. Bone marrow on 08/31/2007 showed 46% plasma cells. A  repeat bone marrow on 08/05/2009 showed 73% plasma cells.  Treatment with Revlimid was started in April 2011. Zometa was  started in October 2011. We had previously started Aranesp for the  patient's anemia in May of 2010. Most recent metastatic bone  survey was on 08/05/2009. Other x-rays since then are available.  Maximum IgA level was 2080 on 08/11/2009.  Subcutaneous Velcade was added to the patient's treatment program on 06/14/2011 because of a rising IgA level 1080 on 06/02/2011. The patient is now receiving  Velcade every 4 weeks in combination with Revlimid being given 10  mg 1 week on 1 week off and Aranesp 300 mcg subcu every 4 weeks for hemoglobin less than or equal to 10. The patient has completed a 2-year course of Zometa.  2. Anemia secondary to vitamin B12 deficiency.  The patient had been on oral vitamin B12. However, her vitamin B12  level fell to 284 on 07/26/2011 and vitamin B12 shots 1000 mcg  given IM every month was resumed on 09/06/2011.  3. Anemia secondary to iron deficiency.  4. Vitamin D deficiency.  5. Magnesium deficiency.  6. Diabetes mellitus.  7. Hypertension.  8. Dyslipidemia.  9. Coronary artery disease.  10.Right anterior thigh mass, most likely a lipoma.  11.Multiple vertebral compression fractures as seen  on MRI of the lumbar spine on 08/27/2011. Kyphoplasty was carried out at L5, T11 and T12 on 10/07/2011 by Dr. Julieanne Cotton.   MEDICATIONS:  1. Aspirin 81 mg daily.  2. Lipitor 40 mg daily.  3. Coreg 25 mg twice daily.  4. Vitamin D to be increased to 7000 international units daily.  5. Aldactone 25 mg every other day.  6. Vicodin 5/500  as needed.  7. Acyclovir 400 mg p.o. b.i.d..  8. Lisinopril 10 mg every daily.  9. Magnesium oxide 400 mg 4 times a day.  10.Glucophage 500 mg twice a day.  11.Remeron 15 mg at bedtime.  12.Multivitamins to be increased to 2 pills daily.  13.Protonix 40 mg daily.  14.Compazine 10 mg as needed.  15.Vitamin B12 1000 mcg IM monthly, restarted on 09/06/11.  16.Revlimid 10 mg daily 1 week on, 1 week off  17.Velcade 2 mg subcu every 4 weeks. Velcade was started on 06/14/2011.  18.Aranesp 300 mcg subcu every 4 weeks for hemoglobin less than or  equal to 10.  19.Zometa 3.5 mg IV administered from 02/14/2010 through 02/23/2012.     IMMUNIZATIONS: 1. Pneumovax was apparently given about 9 years ago when the patient     was 78 years old. 2. Flu shot was given on 01/27/2012.   SMOKING HISTORY:  The patient has never smoked cigarettes.   HISTORY:  I saw Christina Hopkins today for followup of her IgA kappa multiple myeloma.  Christina Hopkins was accompanied by her daughter Christina Hopkins.  She was last seen by [place] on 02/23/2012.  There have been no major changes in her condition.  She continues to do fairly well.  Weight is up a few pounds from a couple months ago.  The patient continues to have low back pain and recently some mild left hip pain.  The patient gets around with a  rolling walker.  Christina Hopkins has been on Revlimid 10 mg daily, 1 week on and 1 week off. She just started Revlimid again on Tuesday, December 3rd.  She also receives Velcade 2.0 mg subcutaneously every 4 weeks.  This was last administered on 03/22/2012.  Zometa was last given on 02/23/2012, and the patient has completed a 2-year course.  We will hold on further Zometa.  Patient also receives vitamin B12 injections 1,000 units every month.  These were restarted on 09/06/2011.  Finally, the patient receives Aranesp 300 mcg subcu every 4 weeks whenever the hemoglobin is less than or equal to 10.  The patient has not required any  Aranesp since 01/26/2012, when the hemoglobin was 9.5.  PHYSICAL EXAMINATION:  Christina Hopkins looks well and is in no acute distress.  Weight is 113 pounds.  Height 4 feet 11 inches.  Body surface area 1.46 m2.  Blood pressure 107/51.  Other vital signs are normal. There is no scleral icterus.  She does have an ulcer on the inside of her upper lip in the midline.  This is more like a fissure, does not really look like a chemotherapy ulcer.  The rest of the mouth and pharynx are benign.  No adenopathy.  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 Patient does not have a Port-A-Cath or central catheter.  Lungs:  Some end inspiratory rales at the bases.  Cardiac:  Regular rhythm without murmur or rub.  Abdomen:  With the patient sitting is benign. Extremities:  No peripheral edema or clubbing.  The patient has a rolling walker.  She has a lipoma involving the right lower proximal thigh, which is stable.  LABORATORY DATA:  Today:  White count 4.3, ANC 2.2, hemoglobin 11.1, hematocrit 33.2, platelets 131,000.  Chemistries, today:  Notable for a glucose of 206, otherwise normal.  Albumin is 3.5.  LDH 146, BUN 13, creatinine 1.0.  IgA level on 02/23/2012 was 449, up from 261 on 12/29/2011.  Vitamin B12 level on 02/23/2012 was 720, and the last vitamin D level was 35 on 12/29/2011.  Pending today are the IgA level, vitamin B12 and vitamin D levels.  IMAGING STUDIES:  1. Most recent metastatic bone survey was on 08/05/2009. Prior to  that, we had a metastatic bone survey from 11/03/2008 and  07/23/2007. The metastatic bone survey from 08/05/2009 stated that  there were no findings to strongly suggest metastatic disease.  There were multiple sites of degenerative change.  2. Chest x-ray from 02/09/2010 showed new or increased T6 and T7  compression fractures compared with the skeletal survey of  08/05/2009.  3. CT scan of head without IV  contrast showed no acute findings. The  calvarium was intact. There was a probable old lacunar infarct in  the right basal ganglia.  4. There are x-rays of the left hip and lumbar spine from 07/16/2010.  There are x-rays of the left humerus and cervical spine from  06/30/2010.  5. An ultrasound of the right lower extremity was carried out on 08/18/2010  and did not show a discrete mass in the right thigh.  6. Screening mammogram from 02/01/2011 carried out at [facility] was negative.  7. MRI of the lumbar spine with and without IV contrast on 08/27/2011  showed acute/subacute superior endplate fracture of L5 with minimal  loss of vertebral body height and no significant retropulsion.  There was a nonhealed superior endplate  compression fracture at T11  and T12 with associated kyphosis. There was central canal stenosis  greatest at L4-5, foraminal stenosis greatest at L2-3 and L3-4, and  nonhealed sacral insufficiency fractures. There was multilevel  spondylosis of the lumbar spine.  8. Kyphoplasty at L5, T11 and T12 carried out on 10/07/2011 by Dr.  Julieanne Cotton was carried out successfully. Biopsies showed  blood clot and hypercellular marrow with increased plasma cell  infiltrates at L5, T11 and T12.   IMPRESSION AND PLAN:  Clinically, Christina Hopkins seems to be doing well.  I am a little concerned about the increase in her IgA level to 449 on 02/23/2012, whereas it had been in the 200 range dating back to May. The patient had been receiving Velcade every 2 weeks, and we increased the Velcade interval to every 4 weeks.  We may need to shorten the interval if it appears that the IgA level is rising.  Christina Hopkins is due for Velcade 2 mg subcu today.  She will also receive a vitamin B12 injection 1,000 mcg IM.  Patient does not need Aranesp today, as her hemoglobin is greater than 10, which is our cutoff value. Christina Hopkins is taking Revlimid 10 mg daily, 1 week on and 1  week off.  She will return on January 2nd for CBC and to receive Velcade, vitamin B12.  She will also receive Aranesp if her hemoglobin is less than or equal to 10.  We will plan to see Christina Hopkins again around January 30th, at which time we will check CBC, chemistries, IgA level, and vitamin D level.    ______________________________ Samul Dada, M.D. DSM/MEDQ  D:  04/19/2012  T:  04/20/2012  Job:  [id number]

## 2012-04-24 ENCOUNTER — Encounter: Payer: Self-pay | Admitting: Oncology

## 2012-04-24 ENCOUNTER — Other Ambulatory Visit: Payer: Self-pay | Admitting: *Deleted

## 2012-04-24 ENCOUNTER — Other Ambulatory Visit: Payer: Self-pay | Admitting: Oncology

## 2012-04-24 DIAGNOSIS — C9000 Multiple myeloma not having achieved remission: Secondary | ICD-10-CM

## 2012-04-24 MED ORDER — LENALIDOMIDE 10 MG PO CAPS: 10.0000 mg | ORAL_CAPSULE | ORAL | Status: DC

## 2012-04-24 NOTE — Progress Notes (Signed)
This patient had been receiving subcutaneous Velcade every 4 weeks. We are going to change the treatment interval to every 2 weeks. Patient will receive treatment on 05/03/2012, 05/17/2012 and 05/31/2012. We will see her in the office on 06/14/2012.  IgA level has been rising. On the patient's last visit, 04/19/2012, IgA level was 704. On 02/23/2012, IgA level was 449 and on 12/29/2011 IgA level was 261.

## 2012-04-25 ENCOUNTER — Telehealth: Payer: Self-pay

## 2012-04-25 NOTE — Telephone Encounter (Signed)
S/w pt and daughter that IgA levels are rising and Dr Arline Asp wants pt to have SQ velcade q2wks. Scheduler will call with times for lab and appt. Both expressed understanding.

## 2012-04-26 NOTE — Telephone Encounter (Signed)
RECEIVED A FAX FROM [employer] CONCERNING A CONFIRMATION OF PRESCRIPTION SHIPMENT FOR REVLIMID ON 04/25/12.

## 2012-04-30 ENCOUNTER — Telehealth: Payer: Self-pay | Admitting: Oncology

## 2012-04-30 NOTE — Telephone Encounter (Signed)
S/w pt re next appt for 12/19. Pt to get new schedule when she comes in.

## 2012-05-03 ENCOUNTER — Ambulatory Visit (HOSPITAL_BASED_OUTPATIENT_CLINIC_OR_DEPARTMENT_OTHER): Payer: Medicare Other

## 2012-05-03 ENCOUNTER — Other Ambulatory Visit (HOSPITAL_BASED_OUTPATIENT_CLINIC_OR_DEPARTMENT_OTHER): Payer: Medicare Other | Admitting: Lab

## 2012-05-03 VITALS — BP 103/57 | HR 63 | Temp 97.8°F | Resp 20

## 2012-05-03 DIAGNOSIS — C9000 Multiple myeloma not having achieved remission: Secondary | ICD-10-CM

## 2012-05-03 DIAGNOSIS — E785 Hyperlipidemia, unspecified: Secondary | ICD-10-CM

## 2012-05-03 DIAGNOSIS — E612 Magnesium deficiency: Secondary | ICD-10-CM

## 2012-05-03 DIAGNOSIS — D649 Anemia, unspecified: Secondary | ICD-10-CM

## 2012-05-03 DIAGNOSIS — D509 Iron deficiency anemia, unspecified: Secondary | ICD-10-CM

## 2012-05-03 DIAGNOSIS — Z5112 Encounter for antineoplastic immunotherapy: Secondary | ICD-10-CM

## 2012-05-03 DIAGNOSIS — I10 Essential (primary) hypertension: Secondary | ICD-10-CM

## 2012-05-03 DIAGNOSIS — I2510 Atherosclerotic heart disease of native coronary artery without angina pectoris: Secondary | ICD-10-CM

## 2012-05-03 DIAGNOSIS — D519 Vitamin B12 deficiency anemia, unspecified: Secondary | ICD-10-CM

## 2012-05-03 DIAGNOSIS — E538 Deficiency of other specified B group vitamins: Secondary | ICD-10-CM

## 2012-05-03 LAB — CBC WITH DIFFERENTIAL/PLATELET
BASO%: 0.9 % (ref 0.0–2.0)
HCT: 30.1 % — ABNORMAL LOW (ref 34.8–46.6)
LYMPH%: 29.9 % (ref 14.0–49.7)
MCHC: 32.9 g/dL (ref 31.5–36.0)
MCV: 100.3 fL (ref 79.5–101.0)
MONO#: 1 10*3/uL — ABNORMAL HIGH (ref 0.1–0.9)
MONO%: 18.5 % — ABNORMAL HIGH (ref 0.0–14.0)
NEUT%: 47.7 % (ref 38.4–76.8)
Platelets: 148 10*3/uL (ref 145–400)
RBC: 3 10*6/uL — ABNORMAL LOW (ref 3.70–5.45)
nRBC: 0 % (ref 0–0)

## 2012-05-03 MED ORDER — BORTEZOMIB CHEMO SQ INJECTION 3.5 MG (2.5MG/ML)
1.3000 mg/m2 | Freq: Once | INTRAMUSCULAR | Status: AC
  Administered 2012-05-03: 2 mg via SUBCUTANEOUS
  Filled 2012-05-03: qty 2

## 2012-05-03 MED ORDER — ONDANSETRON HCL 8 MG PO TABS
8.0000 mg | ORAL_TABLET | Freq: Once | ORAL | Status: AC
  Administered 2012-05-03: 8 mg via ORAL

## 2012-05-03 MED ORDER — DARBEPOETIN ALFA-POLYSORBATE 500 MCG/ML IJ SOLN
300.0000 ug | Freq: Once | INTRAMUSCULAR | Status: AC
  Administered 2012-05-03: 300 ug via SUBCUTANEOUS
  Filled 2012-05-03: qty 1

## 2012-05-03 NOTE — Patient Instructions (Addendum)
[facility] Discharge Instructions for Patients Receiving Chemotherapy  Today you received the following chemotherapy agents velcade  To help prevent nausea and vomiting after your treatment, we encourage you to take your nausea medication and take it as often as prescribed   If you develop nausea and vomiting that is not controlled by your nausea medication, call the clinic. If it is after clinic hours your family physician or the after hours number for the clinic or go to the Emergency Department.   BELOW ARE SYMPTOMS THAT SHOULD BE REPORTED IMMEDIATELY:  *FEVER GREATER THAN 100.5 F  *CHILLS WITH OR WITHOUT FEVER  NAUSEA AND VOMITING THAT IS NOT CONTROLLED WITH YOUR NAUSEA MEDICATION  *UNUSUAL SHORTNESS OF BREATH  *UNUSUAL BRUISING OR BLEEDING  TENDERNESS IN MOUTH AND THROAT WITH OR WITHOUT PRESENCE OF ULCERS  *URINARY PROBLEMS  *BOWEL PROBLEMS  UNUSUAL RASH Items with * indicate a potential emergency and should be followed up as soon as possible.  One of the nurses will contact you 24 hours after your treatment. Please let the nurse know about any problems that you may have experienced. Feel free to call the clinic you have any questions or concerns. The clinic phone number is [phone].   I have been informed and understand all the instructions given to me. I know to contact the clinic, my physician, or go to the Emergency Department if any problems should occur. I do not have any questions at this time, but understand that I may call the clinic during office hours   should I have any questions or need assistance in obtaining follow up care.    __________________________________________  _____________  __________ Signature of Patient or Authorized Representative            Date                   Time    __________________________________________ Nurse's Signature    

## 2012-05-04 ENCOUNTER — Emergency Department (HOSPITAL_COMMUNITY): Payer: Medicare Other

## 2012-05-04 ENCOUNTER — Observation Stay (HOSPITAL_COMMUNITY)
Admission: EM | Admit: 2012-05-04 | Discharge: 2012-05-05 | Disposition: A | Discharge status: Home / Self Care | Payer: Medicare Other | Attending: Family Medicine | Admitting: Family Medicine

## 2012-05-04 ENCOUNTER — Encounter (HOSPITAL_COMMUNITY): Payer: Self-pay | Admitting: Emergency Medicine

## 2012-05-04 DIAGNOSIS — M810 Age-related osteoporosis without current pathological fracture: Secondary | ICD-10-CM | POA: Insufficient documentation

## 2012-05-04 DIAGNOSIS — I10 Essential (primary) hypertension: Secondary | ICD-10-CM | POA: Insufficient documentation

## 2012-05-04 DIAGNOSIS — E860 Dehydration: Secondary | ICD-10-CM | POA: Insufficient documentation

## 2012-05-04 DIAGNOSIS — Z79899 Other long term (current) drug therapy: Secondary | ICD-10-CM | POA: Insufficient documentation

## 2012-05-04 DIAGNOSIS — C9000 Multiple myeloma not having achieved remission: Secondary | ICD-10-CM

## 2012-05-04 DIAGNOSIS — X58XXXA Exposure to other specified factors, initial encounter: Secondary | ICD-10-CM | POA: Insufficient documentation

## 2012-05-04 DIAGNOSIS — D649 Anemia, unspecified: Secondary | ICD-10-CM | POA: Insufficient documentation

## 2012-05-04 DIAGNOSIS — F29 Unspecified psychosis not due to a substance or known physiological condition: Principal | ICD-10-CM | POA: Insufficient documentation

## 2012-05-04 DIAGNOSIS — Z85038 Personal history of other malignant neoplasm of large intestine: Secondary | ICD-10-CM | POA: Insufficient documentation

## 2012-05-04 DIAGNOSIS — E785 Hyperlipidemia, unspecified: Secondary | ICD-10-CM | POA: Insufficient documentation

## 2012-05-04 DIAGNOSIS — S22009A Unspecified fracture of unspecified thoracic vertebra, initial encounter for closed fracture: Secondary | ICD-10-CM | POA: Insufficient documentation

## 2012-05-04 DIAGNOSIS — I2510 Atherosclerotic heart disease of native coronary artery without angina pectoris: Secondary | ICD-10-CM | POA: Diagnosis present

## 2012-05-04 DIAGNOSIS — G9340 Encephalopathy, unspecified: Secondary | ICD-10-CM | POA: Insufficient documentation

## 2012-05-04 DIAGNOSIS — R4789 Other speech disturbances: Secondary | ICD-10-CM | POA: Insufficient documentation

## 2012-05-04 DIAGNOSIS — E119 Type 2 diabetes mellitus without complications: Secondary | ICD-10-CM | POA: Insufficient documentation

## 2012-05-04 DIAGNOSIS — I959 Hypotension, unspecified: Secondary | ICD-10-CM

## 2012-05-04 DIAGNOSIS — Z951 Presence of aortocoronary bypass graft: Secondary | ICD-10-CM | POA: Insufficient documentation

## 2012-05-04 DIAGNOSIS — I252 Old myocardial infarction: Secondary | ICD-10-CM | POA: Insufficient documentation

## 2012-05-04 DIAGNOSIS — I6529 Occlusion and stenosis of unspecified carotid artery: Secondary | ICD-10-CM | POA: Insufficient documentation

## 2012-05-04 DIAGNOSIS — R197 Diarrhea, unspecified: Secondary | ICD-10-CM | POA: Insufficient documentation

## 2012-05-04 DIAGNOSIS — G459 Transient cerebral ischemic attack, unspecified: Secondary | ICD-10-CM | POA: Insufficient documentation

## 2012-05-04 DIAGNOSIS — N179 Acute kidney failure, unspecified: Secondary | ICD-10-CM

## 2012-05-04 LAB — CBC WITH DIFFERENTIAL/PLATELET
Eosinophils Relative: 1 % (ref 0–5)
HCT: 25.5 % — ABNORMAL LOW (ref 36.0–46.0)
Hemoglobin: 8.6 g/dL — ABNORMAL LOW (ref 12.0–15.0)
Lymphocytes Relative: 28 % (ref 12–46)
Lymphs Abs: 1 10*3/uL (ref 0.7–4.0)
MCV: 98.8 fL (ref 78.0–100.0)
Monocytes Absolute: 0.7 10*3/uL (ref 0.1–1.0)
RBC: 2.58 MIL/uL — ABNORMAL LOW (ref 3.87–5.11)
WBC: 3.6 10*3/uL — ABNORMAL LOW (ref 4.0–10.5)

## 2012-05-04 LAB — COMPREHENSIVE METABOLIC PANEL
ALT: 10 U/L (ref 0–35)
CO2: 24 mEq/L (ref 19–32)
Calcium: 8.9 mg/dL (ref 8.4–10.5)
Chloride: 96 mEq/L (ref 96–112)
Creatinine, Ser: 1.21 mg/dL — ABNORMAL HIGH (ref 0.50–1.10)
GFR calc Af Amer: 46 mL/min — ABNORMAL LOW (ref >90)
GFR calc non Af Amer: 40 mL/min — ABNORMAL LOW (ref >90)
Glucose, Bld: 79 mg/dL (ref 70–99)
Total Bilirubin: 0.3 mg/dL (ref 0.3–1.2)

## 2012-05-04 LAB — URINALYSIS, ROUTINE W REFLEX MICROSCOPIC
Ketones, ur: NEGATIVE mg/dL
Leukocytes, UA: NEGATIVE
Nitrite: NEGATIVE
Urobilinogen, UA: 0.2 mg/dL (ref 0.0–1.0)
pH: 5 (ref 5.0–8.0)

## 2012-05-04 LAB — GLUCOSE, CAPILLARY
Glucose-Capillary: 60 mg/dL — ABNORMAL LOW (ref 70–99)
Glucose-Capillary: 108 mg/dL — ABNORMAL HIGH (ref 70–99)

## 2012-05-04 LAB — OCCULT BLOOD, POC DEVICE: Fecal Occult Bld: NEGATIVE

## 2012-05-04 LAB — LACTIC ACID, PLASMA: Lactic Acid, Venous: 0.4 mmol/L — ABNORMAL LOW (ref 0.5–2.2)

## 2012-05-04 MED ORDER — ACETAMINOPHEN 325 MG PO TABS
650.0000 mg | ORAL_TABLET | Freq: Four times a day (QID) | ORAL | Status: DC | PRN
  Administered 2012-05-05: 650 mg via ORAL
  Filled 2012-05-04: qty 2

## 2012-05-04 MED ORDER — SODIUM CHLORIDE 0.9 % IV BOLUS (SEPSIS)
1000.0000 mL | Freq: Once | INTRAVENOUS | Status: AC
  Administered 2012-05-04: 1000 mL via INTRAVENOUS

## 2012-05-04 MED ORDER — HYDROCODONE-ACETAMINOPHEN 10-325 MG PO TABS
1.0000 | ORAL_TABLET | Freq: Four times a day (QID) | ORAL | Status: DC | PRN
  Administered 2012-05-05: 1 via ORAL
  Filled 2012-05-04: qty 1

## 2012-05-04 MED ORDER — MIRTAZAPINE 15 MG PO TABS
15.0000 mg | ORAL_TABLET | Freq: Every day | ORAL | Status: DC
  Administered 2012-05-05: 15 mg via ORAL
  Filled 2012-05-04 (×2): qty 1

## 2012-05-04 MED ORDER — ATORVASTATIN CALCIUM 40 MG PO TABS
40.0000 mg | ORAL_TABLET | Freq: Every day | ORAL | Status: DC
  Administered 2012-05-05: 40 mg via ORAL
  Filled 2012-05-04: qty 1

## 2012-05-04 MED ORDER — SODIUM CHLORIDE 0.9 % IV SOLN
INTRAVENOUS | Status: DC
  Administered 2012-05-04: 16:00:00 via INTRAVENOUS
  Administered 2012-05-05: 100 mL/h via INTRAVENOUS
  Administered 2012-05-05: 12:00:00 via INTRAVENOUS

## 2012-05-04 MED ORDER — ACETAMINOPHEN 650 MG RE SUPP: 650.0000 mg | Freq: Four times a day (QID) | RECTAL | Status: DC | PRN

## 2012-05-04 MED ORDER — ONDANSETRON HCL 4 MG/2ML IJ SOLN: 4.0000 mg | Freq: Four times a day (QID) | INTRAMUSCULAR | Status: DC | PRN

## 2012-05-04 MED ORDER — ASPIRIN 81 MG PO CHEW
81.0000 mg | CHEWABLE_TABLET | Freq: Every day | ORAL | Status: DC
  Administered 2012-05-05: 81 mg via ORAL
  Filled 2012-05-04: qty 1

## 2012-05-04 MED ORDER — INSULIN ASPART 100 UNIT/ML ~~LOC~~ SOLN: 0.0000 [IU] | Freq: Every day | SUBCUTANEOUS | Status: DC

## 2012-05-04 MED ORDER — METFORMIN HCL 500 MG PO TABS
500.0000 mg | ORAL_TABLET | Freq: Two times a day (BID) | ORAL | Status: DC
  Administered 2012-05-05: 500 mg via ORAL
  Filled 2012-05-04 (×3): qty 1

## 2012-05-04 MED ORDER — ACYCLOVIR 400 MG PO TABS
400.0000 mg | ORAL_TABLET | Freq: Two times a day (BID) | ORAL | Status: DC
  Administered 2012-05-05 (×2): 400 mg via ORAL
  Filled 2012-05-04 (×3): qty 1

## 2012-05-04 MED ORDER — HYDROCODONE-ACETAMINOPHEN 5-325 MG PO TABS
1.0000 | ORAL_TABLET | Freq: Once | ORAL | Status: AC
  Administered 2012-05-04: 1 via ORAL

## 2012-05-04 MED ORDER — SENNOSIDES-DOCUSATE SODIUM 8.6-50 MG PO TABS
1.0000 | ORAL_TABLET | Freq: Every evening | ORAL | Status: DC | PRN
  Filled 2012-05-04: qty 1

## 2012-05-04 MED ORDER — INSULIN ASPART 100 UNIT/ML ~~LOC~~ SOLN: 0.0000 [IU] | Freq: Three times a day (TID) | SUBCUTANEOUS | Status: DC

## 2012-05-04 MED ORDER — ONDANSETRON HCL 4 MG PO TABS: 4.0000 mg | ORAL_TABLET | Freq: Four times a day (QID) | ORAL | Status: DC | PRN

## 2012-05-04 MED ORDER — HYDROCODONE-ACETAMINOPHEN 5-325 MG PO TABS
ORAL_TABLET | ORAL | Status: AC
  Filled 2012-05-04: qty 1

## 2012-05-04 MED ORDER — HEPARIN SODIUM (PORCINE) 5000 UNIT/ML IJ SOLN
5000.0000 [IU] | Freq: Three times a day (TID) | INTRAMUSCULAR | Status: DC
  Administered 2012-05-05 (×2): 5000 [IU] via SUBCUTANEOUS
  Filled 2012-05-04 (×4): qty 1

## 2012-05-04 MED ORDER — PANTOPRAZOLE SODIUM 40 MG PO TBEC
40.0000 mg | DELAYED_RELEASE_TABLET | Freq: Every day | ORAL | Status: DC
  Administered 2012-05-05: 40 mg via ORAL
  Filled 2012-05-04: qty 1

## 2012-05-04 MED ORDER — CYCLOBENZAPRINE HCL 10 MG PO TABS
10.0000 mg | ORAL_TABLET | Freq: Three times a day (TID) | ORAL | Status: DC | PRN
  Filled 2012-05-04: qty 1

## 2012-05-04 NOTE — ED Notes (Signed)
Patient dsat to 85 on room air

## 2012-05-04 NOTE — H&P (Signed)
PCP:   Johny Blamer, MD   Chief Complaint:  Confusion  HPI: This is 78 year old female who lives at home with her daughter, this morning she had that episode of confusion with slurred speech. This has long since resolved. During the course of the day she also had a low urine output. Early today she also had a severe episode of diarrhea, no evidence of blood. She did have a temperature to 101. Her daughter who is a nurse saw his the patient may have urinary tract infection and brought her to ER this evening. Here in the ER the patient was found to be hypotensive with systolic blood pressures in the 80s. She has received 3 L of normal saline her systolic blood pressure is now 95. The hospitalist was called and requested to admit. Per patient's daughter the patient's systolic blood pressure normally runs in the low 100s. She is on blood pressure medication, and they've recently changed her lisinopril to every other day. The patient does have a history of multiple myeloma for which she is on chemotherapy. History provided by the patient's daughter [name] who is present at the bedside.  Review of Systems:  The patient denies anorexia, weight loss,, vision loss, decreased hearing, hoarseness, chest pain, syncope, dyspnea on exertion, peripheral edema, balance deficits, hemoptysis, abdominal pain, melena, hematochezia, severe indigestion/heartburn, hematuria, incontinence, genital sores, muscle weakness, suspicious skin lesions, transient blindness, difficulty walking, depression, unusual weight change, abnormal bleeding, enlarged lymph nodes, angioedema, and breast masses.  Past Medical History: Past Medical History  Diagnosis Date  . colon ca dx'd 2003    surg only  . Multiple myeloma(203.0) dx'd 2009    oral chemo ongoing  . Hypertension   . Diabetes mellitus   . Anemia   . Osteoporosis   . Dyslipidemia   . Myocardial infarction    Past Surgical History  Procedure Date  . Coronary artery  bypass graft 08/2001    4 vessel  . Colon surgery 2003  . Tonsillectomy   . Appendectomy   . Abdominal hysterectomy     Medications: Prior to Admission medications   Medication Sig Start Date End Date Taking? Authorizing Provider  Acetaminophen (TYLENOL ARTHRITIS EXT RELIEF PO) Take by mouth.   Yes Historical Provider, MD  acyclovir (ZOVIRAX) 400 MG tablet Take 1 tablet (400 mg total) by mouth 2 (two) times daily. 01/18/12  Yes Samul Dada, MD  Alum & Mag Hydroxide-Simeth (MAGIC MOUTHWASH) SOLN Take by mouth as needed.   Yes Historical Provider, MD  aspirin 81 MG tablet Take 81 mg by mouth daily.     Yes Historical Provider, MD  atorvastatin (LIPITOR) 40 MG tablet Take 40 mg by mouth daily.     Yes Historical Provider, MD  carvedilol (COREG) 25 MG tablet Take 25 mg by mouth 2 (two) times daily with a meal.     Yes Historical Provider, MD  cholecalciferol (VITAMIN D) 1000 UNITS tablet Take 2,000-5,000 Units by mouth 2 (two) times daily. 5000 units at AM and 2000 units at night   Yes Historical Provider, MD  cyclobenzaprine (FLEXERIL) 10 MG tablet Take 10 mg by mouth 3 (three) times daily as needed. For muscle spasm   Yes Historical Provider, MD  HYDROcodone-acetaminophen (NORCO) 10-325 MG per tablet Take 1 tablet by mouth every 6 (six) hours as needed. 04/19/12  Yes Gerarda Fraction Murinson, MD  lenalidomide (REVLIMID) 10 MG capsule Take 10 mg by mouth See admin instructions. Take 1 po daily x 7 days  on, then 7 days off. 04/24/12  Yes Gerarda Fraction Murinson, MD  lisinopril (PRINIVIL,ZESTRIL) 10 MG tablet Take 10 mg by mouth every other day.    Yes Historical Provider, MD  Magnesium 400 MG CAPS Take 1 capsule by mouth 4 (four) times daily. 01/19/12  Yes Samul Dada, MD  metFORMIN (GLUCOPHAGE) 500 MG tablet Take 500 mg by mouth 2 (two) times daily with a meal.     Yes Historical Provider, MD  mirtazapine (REMERON) 15 MG tablet Take 15 mg by mouth at bedtime.     Yes Historical Provider, MD  Multiple  Vitamins-Minerals (CENTRUM SILVER PO) Take 1 tablet by mouth daily.    Yes Historical Provider, MD  pantoprazole (PROTONIX) 40 MG tablet Take 40 mg by mouth daily.     Yes Historical Provider, MD  Polyethyl Glycol-Propyl Glycol (SYSTANE OP) Place 2 drops into both eyes daily.   Yes Historical Provider, MD  prochlorperazine (COMPAZINE) 10 MG tablet Take 10 mg by mouth every 6 (six) hours as needed. For nausea 04/05/11  Yes Samul Dada, MD  Sodium Fluoride (PREVIDENT 5000 PLUS DT) Place onto teeth at bedtime.   Yes Historical Provider, MD  spironolactone (ALDACTONE) 25 MG tablet Take 25 mg by mouth every other day.    Yes Historical Provider, MD    Allergies:   Allergies  Allergen Reactions  . Gemfibrozil Nausea And Vomiting  . Nifedipine Hives  . Questran (Cholestyramine) Other (See Comments)    Do not remember    Social History:  reports that she has never smoked. She does not have any smokeless tobacco history on file. She reports that she drinks about .6 ounces of alcohol per week. Her drug history not on file. lives with daughter Dian Situ [phone]  Family History: Family History  Problem Relation Age of Onset  . Heart disease Brother   . Diabetes Brother     Physical Exam: Filed Vitals:   05/04/12 2030 05/04/12 2100 05/04/12 2130 05/04/12 2200  BP: 91/44 103/48 95/44 104/47  Pulse: 72 67    Temp: 98.4 F (36.9 C)     TempSrc:      Resp: 14 16 14 17   SpO2: 93% 93%      General:  Alert and oriented times three, well developed and nourished,weak Eyes: PERRLA, pink conjunctiva, no scleral icterus ENT: dry oral mucosa, neck supple, no thyromegaly Lungs: clear to ascultation, no wheeze, no crackles, no use of accessory muscles Cardiovascular: regular rate and rhythm, no regurgitation, no gallops, no murmurs. carotid bruits, no JVD Abdomen: soft, positive BS, non-tender, non-distended, no organomegaly, not an acute abdomen GU: not examined Neuro: CN II - XII  grossly intact, sensation intact Musculoskeletal: strength 5/5 all extremities, no clubbing, cyanosis or edema Skin: no rash, no subcutaneous crepitation, no decubitus Psych: appropriate patient   Labs on Admission:   Basename 05/04/12 1500  NA 130*  K 4.2  CL 96  CO2 24  GLUCOSE 79  BUN 21  CREATININE 1.21*  CALCIUM 8.9  MG --  PHOS --    Basename 05/04/12 1500  AST 16  ALT 10  ALKPHOS 40  BILITOT 0.3  PROT 6.3  ALBUMIN 3.0*   No results found for this basename: LIPASE:2,AMYLASE:2 in the last 72 hours  Basename 05/04/12 1500 05/03/12 1132  WBC 3.6* 5.4  NEUTROABS 1.9 2.6  HGB 8.6* 9.9*  HCT 25.5* 30.1*  MCV 98.8 100.3  PLT 112* 148    Micro Results:  Results  for AANSHI, BATCHELDER (MRN [id number]) as of 05/04/2012 22:45  Ref. Range 05/04/2012 14:24  Color, Urine Latest Range: YELLOW  YELLOW  APPearance Latest Range: CLEAR  CLEAR  Specific Gravity, Urine Latest Range: 1.005-1.030  1.023  pH Latest Range: 5.0-8.0  5.0  Glucose Latest Range: NEGATIVE mg/dL NEGATIVE  Bilirubin Urine Latest Range: NEGATIVE  SMALL (A)  Ketones, ur Latest Range: NEGATIVE mg/dL NEGATIVE  Protein Latest Range: NEGATIVE mg/dL NEGATIVE  Urobilinogen, UA Latest Range: 0.0-1.0 mg/dL 0.2  Nitrite Latest Range: NEGATIVE  NEGATIVE  Leukocytes, UA Latest Range: NEGATIVE  NEGATIVE  Hgb urine dipstick Latest Range: NEGATIVE  NEGATIVE    Radiological Exams on Admission: Dg Chest 2 View  05/04/2012  *RADIOLOGY REPORT*  Clinical Data: Altered mental status, fever, history of carcinoma of the colon and multiple myeloma on chemotherapy, history hypertension, diabetes  CHEST - 2 VIEW  Comparison: 02/09/2010  Findings: Enlargement of cardiac silhouette post CABG. Calcified tortuous thoracic aorta. Pulmonary vascular congestion. Chronic scarring left base. Chronic peribronchial thickening. No pulmonary infiltrate, pleural effusion or pneumothorax. Diffuse osseous demineralization with prior  vertebroplasties at adjacent levels of the lower thoracic spine. Additional chronic compression deformities of adjacent mid thoracic vertebrae again noted.  IMPRESSION: Enlargement of cardiac silhouette post CABG. Chronic bronchitic changes with left basilar scarring. No acute abnormalities. Osteoporosis with multiple thoracic spine compression fractures.   Original Report Authenticated By: Ulyses Southward, M.D.    Dg Thoracic Spine 2 View  05/04/2012  *RADIOLOGY REPORT*  Clinical Data: 78 year old female with back pain.  Multiple myeloma.  Fever, chemotherapy.  THORACIC SPINE - 2 VIEW  Comparison: Lumbar MRI 08/27/2011.  Chest radiographs 02/09/2010.  Findings: Interval augmentation of lower thoracic compression fractures which appear to be the T11-T12 levels.  Chronic T6 and T7 compression fractures.  Questionable mild T3 inferior endplate fracture which is new since 2011.  L1 inferior endplate mild compression deformity is new since April.  Cervicothoracic junction alignment is within normal limits. Sequelae median sternotomy.  Extensive aortic calcified atherosclerosis.  IMPRESSION: 1.    Evidence of mild T3 and L1 compression fractures which are new since 2011 and April 2013 respectively. If specific therapy such as vertebroplasty is desired, MRI or whole body bone scan would best determine acuity. 2.  Interval augmentation of the T11 and T12 fractures.  Chronic T6 and T7 compression fractures.   Original Report Authenticated By: Erskine Speed, M.D.    Ct Head Wo Contrast  05/04/2012  *RADIOLOGY REPORT*  Clinical Data: Altered mental status.  CT HEAD WITHOUT CONTRAST  Technique:  Contiguous axial images were obtained from the base of the skull through the vertex without contrast.  Comparison: Head CT 07/16/2010.  Findings: Mild cerebral and cerebellar atrophy is again noted. There are patchy and confluent areas of decreased attenuation throughout the deep and periventricular white matter of the cerebral  hemispheres bilaterally, most compatible with chronic microvascular ischemic changes.  Old lacunar infarction in the right basal ganglia is again noted.  No definite acute intracranial abnormalities.  Specifically, no definite signs to suggest acute/subacute cerebral ischemia, acute intracerebral hemorrhage, mass, mass effect, hydrocephalus or abnormal intra or extra-axial fluid collections.  Visualized paranasal sinuses and mastoids are well pneumatized.  No acute displaced skull fractures are identified.  IMPRESSION: 1.  No acute intracranial abnormalities. 2.  The appearance of the brain is unchanged, as discussed above.   Original Report Authenticated By: Trudie Reed, M.D.     Assessment/Plan Present on Admission:  . encephalopathy  Possible TIA, this has resolved, CT head is negative. We'll check a carotid ultrasound and lipid panel in the a.m. Patient has carotid bruits Aspirin daily  Hypotension  Currently doubtful that this is due to infection, DC blood pressure medication, continue IV fluid hydration Patient likely dehydrated with her severe diarrhea from this a.m. which resulted in hypotension for an already borderline blood pressure patient who is on on blood pressure medication.  Blood cultures collected but no antibiotics started. IV fluid hydration continued  Patient will be brought in overnight and monitored especially as she has multiple myeloma and on chemotherapy she is immunocompromised, there for this potential for infection Fever Suspect viral T3 and L1 fracture May be related to the patient's multiple myeloma Will order MRI of the thoracic spine . Multiple myeloma . Diabetes mellitus . Hypertension . Dyslipidemia . Coronary artery disease Stable resume home medication except for blood pressure medication ADA diet consent scale insulin  DO NOT RESUSCITATE DVT prophylaxis  Damien Cisar 05/04/2012, 10:45 PM

## 2012-05-04 NOTE — ED Notes (Signed)
MD at bedside. 

## 2012-05-04 NOTE — ED Notes (Signed)
Patient transported to CT 

## 2012-05-04 NOTE — ED Notes (Signed)
Bladder scan 120cc

## 2012-05-04 NOTE — ED Notes (Addendum)
Patient had confusion, difficulty urinating, and garbled speech this morning around 0500.  Patient is A/O upon arrival.  Negative stroke scale.  Patient hasn't voided since around midnight last night.  Patient was diagnosed with UTI several months ago.

## 2012-05-04 NOTE — ED Notes (Signed)
Family at bedside. 

## 2012-05-04 NOTE — ED Notes (Signed)
Pt requesting pain meds for her back pain

## 2012-05-04 NOTE — ED Notes (Signed)
[id number]<JY> Expected date:<BR> Expected time:<BR> Means of arrival:<BR> Comments:<BR> Triage 4

## 2012-05-04 NOTE — ED Provider Notes (Signed)
Care assumed at sign out from Dr. Lynelle Doctor. Patient is a 78 yo F here with weakness, confusion, low grade temp. Labs showed Hg 8.4, dec from 9.9 yesterday. No rectal bleed, occ neg. UA, CXR, CT head unremarkable. His BP still 90s systolic after 2 L bolus. Will admit to medicine for hypotension likely from dehydration. Sepsis workup initiated but I held off of abx given no source found.   CRITICAL CARE Performed by: Silverio Lay, Mahmoud Blazejewski   Total critical care time: 30 min   Critical care time was exclusive of separately billable procedures and treating other patients.  Critical care was necessary to treat or prevent imminent or life-threatening deterioration.  Critical care was time spent personally by me on the following activities: development of treatment plan with patient and/or surrogate as well as nursing, discussions with consultants, evaluation of patient's response to treatment, examination of patient, obtaining history from patient or surrogate, ordering and performing treatments and interventions, ordering and review of laboratory studies, ordering and review of radiographic studies, pulse oximetry and re-evaluation of patient's condition.   Richardean Canal, MD 05/04/12 2130

## 2012-05-04 NOTE — ED Provider Notes (Signed)
History     CSN: [id number]  Arrival date & time 05/04/12  1309   First MD Initiated Contact with Patient 05/04/12 1331      Chief Complaint  Patient presents with  . Urinary Tract Infection    (Consider location/radiation/quality/duration/timing/severity/associated sxs/prior treatment) HPI  Daughter reports about 5 AM patient was found turning on the lights and again and stated she got lost. She had gotten up to use the bathroom and she has run bathroom on her side of the house. Daughter states she noted some slurring of her speech and sometimes using inappropriate words about 5 AM and it went away and then it returned again about 7:30 AM and left again. She states about 5:30 this morning she had a temperature of 100.1. She states she is chronically weak which is not changed. She denies cough, sore throat, vomiting but does have some nausea. She relates she's been unable to urinate since about midnight. She did have some diarrhea a couple of episodes this morning. She also had some epigastric abdominal pain earlier today that is now gone. Patient has eaten her usual diet today. She reports she hasn't urinated since about MN.  SHe is also c/o pain in her upper back and has had kyphoplasty in the past.  Patient is currently getting treatment for multiple myeloma and had a treatment yesterday. She had a CBC done yesterday also. They were seen in the office this morning by Dr. Johnathan Hausen PA and were sent to the ED.  Oncologist Dr Arline Asp PCP Dr Henderson [place]   Past Medical History  Diagnosis Date  . colon ca dx'd 2003    surg only  . Multiple myeloma(203.0) dx'd 2009    oral chemo ongoing  . Hypertension   . Diabetes mellitus   . Anemia   . Osteoporosis   . Dyslipidemia   . Myocardial infarction     Past Surgical History  Procedure Date  . Coronary artery bypass graft 08/2001    4 vessel  . Colon surgery 2003  . Tonsillectomy   . Appendectomy   . Abdominal hysterectomy      Family History  Problem Relation Age of Onset  . Heart disease Brother   . Diabetes Brother     History  Substance Use Topics  . Smoking status: Never Smoker   . Smokeless tobacco: Not on file  . Alcohol Use: 0.6 oz/week    1 Glasses of wine per week  Lives at home Lives with daughter  OB History    Grav Para Term Preterm Abortions TAB SAB Ect Mult Living                  Review of Systems  All other systems reviewed and are negative.    Allergies  Gemfibrozil; Nifedipine; and Questran  Home Medications   Current Outpatient Rx  Name  Route  Sig  Dispense  Refill  . TYLENOL ARTHRITIS EXT RELIEF PO   Oral   Take by mouth.         . ACYCLOVIR 400 MG PO TABS   Oral   Take 1 tablet (400 mg total) by mouth 2 (two) times daily.   60 tablet   3   . MAGIC MOUTHWASH   Oral   Take by mouth as needed.         . ASPIRIN 81 MG PO TABS   Oral   Take 81 mg by mouth daily.           Marland Kitchen  ATORVASTATIN CALCIUM 40 MG PO TABS   Oral   Take 40 mg by mouth daily.           Marland Kitchen CARVEDILOL 25 MG PO TABS   Oral   Take 25 mg by mouth 2 (two) times daily with a meal.           . VITAMIN D 1000 UNITS PO TABS   Oral   Take 2,000-5,000 Units by mouth 2 (two) times daily. 5000 units at AM and 2000 units at night         . CYCLOBENZAPRINE HCL 10 MG PO TABS   Oral   Take 10 mg by mouth 3 (three) times daily as needed. For muscle spasm         . HYDROCODONE-ACETAMINOPHEN 10-325 MG PO TABS   Oral   Take 1 tablet by mouth every 6 (six) hours as needed.         Marland Kitchen LENALIDOMIDE 10 MG PO CAPS   Oral   Take 10 mg by mouth See admin instructions. Take 1 po daily x 7 days on, then 7 days off.         Marland Kitchen LISINOPRIL 10 MG PO TABS   Oral   Take 10 mg by mouth every other day.          Marland Kitchen MAGNESIUM 400 MG PO CAPS   Oral   Take 1 capsule by mouth 4 (four) times daily.   120 capsule   4   . METFORMIN HCL 500 MG PO TABS   Oral   Take 500 mg by mouth 2 (two)  times daily with a meal.           . MIRTAZAPINE 15 MG PO TABS   Oral   Take 15 mg by mouth at bedtime.           . CENTRUM SILVER PO   Oral   Take 1 tablet by mouth daily.          Marland Kitchen PANTOPRAZOLE SODIUM 40 MG PO TBEC   Oral   Take 40 mg by mouth daily.           Frazier Butt OP   Both Eyes   Place 2 drops into both eyes daily.         Marland Kitchen PROCHLORPERAZINE MALEATE 10 MG PO TABS   Oral   Take 10 mg by mouth every 6 (six) hours as needed. For nausea         . PREVIDENT 5000 PLUS DT   dental   Place onto teeth at bedtime.         . SPIRONOLACTONE 25 MG PO TABS   Oral   Take 25 mg by mouth every other day.            BP 102/44  Pulse 74  Temp 98.7 F (37.1 C) (Oral)  Resp 16  SpO2 95%  Vital signs normal    Physical Exam  Nursing note and vitals reviewed. Constitutional: She is oriented to person, place, and time. She appears well-developed and well-nourished.  Non-toxic appearance. She does not appear ill. No distress.  HENT:  Head: Normocephalic and atraumatic.  Right Ear: External ear normal.  Left Ear: External ear normal.  Nose: Nose normal. No mucosal edema or rhinorrhea.  Mouth/Throat: Oropharynx is clear and moist and mucous membranes are normal. No dental abscesses or uvula swelling.  Eyes: Conjunctivae normal and EOM are normal. Pupils are equal, round, and reactive  to light.  Neck: Normal range of motion and full passive range of motion without pain. Neck supple.  Cardiovascular: Normal rate, regular rhythm and normal heart sounds.  Exam reveals no gallop and no friction rub.   No murmur heard. Pulmonary/Chest: Effort normal and breath sounds normal. No respiratory distress. She has no wheezes. She has no rhonchi. She has no rales. She exhibits no tenderness and no crepitus.  Abdominal: Soft. Normal appearance and bowel sounds are normal. She exhibits no distension. There is no tenderness. There is no rebound and no guarding.   Musculoskeletal: Normal range of motion. She exhibits no edema and no tenderness.       Moves all extremities well. Patient's lumbar spine is nontender to palpation. Her thoracic spine is tender where it feels like she has an acute change in the angle of the spine.  Neurological: She is alert and oriented to person, place, and time. She has normal strength. No cranial nerve deficit.  Skin: Skin is warm, dry and intact. No rash noted. No erythema. There is pallor.  Psychiatric: She has a normal mood and affect. Her speech is normal and behavior is normal. Her mood appears not anxious.    ED Course  Procedures (including critical care time)  Bladder scan shows 120 cc urine  15:55 pt turned over to Dr Silverio Lay at change of shift to get rest of lab results and radiology studies  Results for orders placed during the hospital encounter of 05/04/12  URINALYSIS, ROUTINE W REFLEX MICROSCOPIC      Component Value Range   Color, Urine YELLOW  YELLOW   APPearance CLEAR  CLEAR   Specific Gravity, Urine 1.023  1.005 - 1.030   pH 5.0  5.0 - 8.0   Glucose, UA NEGATIVE  NEGATIVE mg/dL   Hgb urine dipstick NEGATIVE  NEGATIVE   Bilirubin Urine SMALL (*) NEGATIVE   Ketones, ur NEGATIVE  NEGATIVE mg/dL   Protein, ur NEGATIVE  NEGATIVE mg/dL   Urobilinogen, UA 0.2  0.0 - 1.0 mg/dL   Nitrite NEGATIVE  NEGATIVE   Leukocytes, UA NEGATIVE  NEGATIVE   Laboratory interpretation UA normal   Radiology studies pending.    Impression Fever after chemotherapy Back pain Confusion   Disposition per Dr Marya Landry, MD, FACEP     MDM          Ward Givens, MD 05/05/12 [phone]

## 2012-05-05 ENCOUNTER — Observation Stay (HOSPITAL_COMMUNITY): Payer: Medicare Other

## 2012-05-05 DIAGNOSIS — N179 Acute kidney failure, unspecified: Secondary | ICD-10-CM

## 2012-05-05 DIAGNOSIS — E860 Dehydration: Secondary | ICD-10-CM

## 2012-05-05 DIAGNOSIS — D649 Anemia, unspecified: Secondary | ICD-10-CM

## 2012-05-05 DIAGNOSIS — E119 Type 2 diabetes mellitus without complications: Secondary | ICD-10-CM

## 2012-05-05 DIAGNOSIS — G459 Transient cerebral ischemic attack, unspecified: Secondary | ICD-10-CM

## 2012-05-05 DIAGNOSIS — I10 Essential (primary) hypertension: Secondary | ICD-10-CM

## 2012-05-05 LAB — BASIC METABOLIC PANEL
CO2: 22 mEq/L (ref 19–32)
Calcium: 8.3 mg/dL — ABNORMAL LOW (ref 8.4–10.5)
Chloride: 108 mEq/L (ref 96–112)
GFR calc Af Amer: 60 mL/min — ABNORMAL LOW (ref >90)
Sodium: 137 mEq/L (ref 135–145)

## 2012-05-05 LAB — CBC
HCT: 25 % — ABNORMAL LOW (ref 36.0–46.0)
Hemoglobin: 8.3 g/dL — ABNORMAL LOW (ref 12.0–15.0)
MCH: 33.1 pg (ref 26.0–34.0)
MCHC: 33.2 g/dL (ref 30.0–36.0)
MCV: 99.6 fL (ref 78.0–100.0)
Platelets: 87 10*3/uL — ABNORMAL LOW (ref 150–400)
RBC: 2.51 MIL/uL — ABNORMAL LOW (ref 3.87–5.11)
RDW: 16 % — ABNORMAL HIGH (ref 11.5–15.5)
WBC: 2.1 10*3/uL — ABNORMAL LOW (ref 4.0–10.5)

## 2012-05-05 LAB — GLUCOSE, CAPILLARY: Glucose-Capillary: 79 mg/dL (ref 70–99)

## 2012-05-05 LAB — URINE CULTURE

## 2012-05-05 MED ORDER — LISINOPRIL 2.5 MG PO TABS: 2.5000 mg | ORAL_TABLET | Freq: Every day | ORAL | Status: DC

## 2012-05-05 MED ORDER — CARVEDILOL 6.25 MG PO TABS: 6.2500 mg | ORAL_TABLET | Freq: Two times a day (BID) | ORAL | Status: DC

## 2012-05-05 MED ORDER — SPIRONOLACTONE 12.5 MG HALF TABLET: 12.5000 mg | ORAL_TABLET | Freq: Every day | ORAL | Status: DC

## 2012-05-05 NOTE — Progress Notes (Signed)
VASCULAR LAB PRELIMINARY  PRELIMINARY  PRELIMINARY  PRELIMINARY  Carotid Dopplers completed.    Preliminary report:  There is no Right ICA stenosis.  There is >40% ICA stenosis on the left.  Bilateral vertebral artery flow is antegrade.  Kavish Lafitte, RVT 05/05/2012, 9:51 AM

## 2012-05-05 NOTE — Progress Notes (Signed)
Discharged via w/c to [facility]. Assessment unchanged from am.

## 2012-05-05 NOTE — Discharge Summary (Signed)
Physician Discharge Summary  Christina Hopkins WUJ:[id number] DOB: 05-Apr-1926 DOA: 05/04/2012  PCP: Johny Blamer, MD  Admit date: 05/04/2012 Discharge date: 05/05/2012  Time spent: >30 minutes  Recommendations for Outpatient Follow-up:  Follow UP BMET to check renal function and electrolytes Reevaluate BP and adjust medications if needed Check CBC to follow anemia  Discharge Diagnoses:  Active Problems:  Multiple myeloma  Diabetes mellitus  Hypertension  Dyslipidemia  Coronary artery disease  Hypotension  Encephalopathy  TIA (transient ischemic attack)   Discharge Condition: stable and improved. Patient will follow with PCP in 1-2 weeks. No acute neurologic abnormalities on exam and with negative findings on CT and dopplers. Continue ASA. Good hydration and follow adjustments on her BP medications.  Diet recommendation: heart healthy diet  Filed Weights   05/04/12 2338  Weight: 120 lb 5.9 oz (54.6 kg)    History of present illness:  78 year old female who lives at home with her daughter, this morning she had that episode of confusion with mild slurred speech. This has long since resolved. During the course of the day she also had a low urine output. Her daughter who is a Engineer, civil (consulting) thought she has urinary tract infection and brought her to ER this evening. Here in the ER the patient was found to be hypotensive with systolic blood pressures in the 80s. She has received 3 L of normal saline her systolic blood pressure is now 95. The hospitalist was called and requested to admit.  Per patient's daughter the patient's systolic blood pressure normally runs in the low 100s. She is on blood pressure medication, and they've recently adjusted due to soft BP (lisinopril to every other day). The patient does have a history of multiple myeloma for which she is on chemotherapy, and had chronic anemia.  Hospital Course:  1-Confusion/mild slurred speech: probably associated with episode of  hypotension. Symptoms resolved after IVF's resuscitation. Patient completely asymptomatic and w/o neurologic deficit. Will continue ASA daily. CT head negative and carotid dopplers not revealing significant CA stenosis.  2-Hypotension: due to dehydration, decrease PO intake and also continue use of antihypertensive drugs. BP meds adjusted and BP now stable. Patient asymptomatic. Will rather has patient taking low dose antihypertensive daily than higher doses every other day (to prevent drastic fluctuation). Patient will follow with PCP for further medication adjustments.  3-Diarrhea, low grade fever and abd discomfort: most likely viral in nature. Asymptomatic now and w/o further diarrhea. Patient advised to keep herself well hydrated.  4-Anemia: stable and without signs of bleeding. Decrease in her levels due to hemodilution effect. Received Aranesp according to family members prior to admission. Close follow up of her Hgb as an outpatient.  5-T3 subacute fracture: just 25% height loss and with no significant impact symptomatically. Will continue current pain meds and patient will follow with PCP. Most likely associated with her low vit D and MM.  6-DM: continue metformin  7-MM: to continue follow up with oncology service as an outpatient.  8-HTN: as above; medication adjusted. Advised to follow a heart healthy diet.  9-Carotid artery stenosis: continue ASA.  10-ARF: due to pre-renal azotemia, hypotensive episode and dehydration. Also playing at role was the continue use of spironolactone and lisinopril. Resolved with IVF's. BP meds adjusted. Patient advised to keep herself well hydrated.  Rest of medical problems remains stable and the plan is to continue same medication regimen.   Procedures: -Carotid dopplers: no RCA stenosis, 40% LCA stenosis; antegrade flow from vertebral arteries -CT head: no acute  intracranial abnormalities -MRI thoracic spine: essentially with subacute 25% height  loss end plate fracture on T3; there is also other multiple chronic fractures and augmentation on her spine from kyphoplasty procedures in the past.  Consultations:  none  Discharge Exam: Filed Vitals:   05/04/12 2338 05/05/12 0606 05/05/12 0612 05/05/12 1508  BP: 125/50 110/40 120/52 106/47  Pulse:    72  Temp: 98.4 F (36.9 C)   98.3 F (36.8 C)  TempSrc: Oral Oral  Oral  Resp: 18 18  16   Height: 4\' 9"  (1.448 m)     Weight: 120 lb 5.9 oz (54.6 kg)     SpO2: 97% 97%  93%    General: NAD, no significant pain on her back, breathing comfortable w/o oxygen Cardiovascular: regular rate, no rubs, no gallops Respiratory: CTA bilaterally Abdomen: soft, NT, ND, positive BS Extremities:no edema Neuro: no focal deficit  Discharge Instructions  Discharge Orders    Future Appointments: Provider: Department: Dept Phone: Center:   05/17/2012 9:00 AM [facility] Hum[facility]bird [facility] MEDICAL ONCOLOGY [phone] None   05/17/2012 9:30 AM [facility] [address] [place] [facility] [phone] None   05/31/2012 1:15 PM Radene Gunning [facility] [phone] None   05/31/2012 1:45 PM [facility] [address] [place] [facility] [phone] None   06/14/2012 10:30 AM [facility]mingbird [facility] [phone] None   06/14/2012 11:00 AM Samul Dada, MD [facility] MEDICAL ONCOLOGY [phone] None   06/14/2012 12:30 PM [facility] [address] [place] [facility] [phone] None     Future Orders Please Complete By Expires   Discharge instructions      Comments:   -Keep yourself well hydrated -Take medications as prescribed -Please arrange follow up with PCP in 1-2 weeks -Follow a low sodium heart healthy diet       Medication List     As of 05/05/2012  3:40 PM    TAKE these medications         acyclovir 400 MG tablet   Commonly known as:  ZOVIRAX   Take 1 tablet (400 mg total) by mouth 2 (two) times daily.      aspirin 81 MG tablet   Take 81 mg by mouth daily.      atorvastatin 40 MG tablet   Commonly known as: LIPITOR   Take 40 mg by mouth daily.      carvedilol 6.25 MG tablet   Commonly known as: COREG   Take 1 tablet (6.25 mg total) by mouth 2 (two) times daily with a meal.      CENTRUM SILVER PO   Take 1 tablet by mouth daily.      cholecalciferol 1000 UNITS tablet   Commonly known as: VITAMIN D   Take 2,000-5,000 Units by mouth 2 (two) times daily. 5000 units at AM and 2000 units at night      cyclobenzaprine 10 MG tablet   Commonly known as: FLEXERIL   Take 10 mg by mouth 3 (three) times daily as needed. For muscle spasm      HYDROcodone-acetaminophen 10-325 MG per tablet   Commonly known as: NORCO   Take 1 tablet by mouth every 6 (six) hours as needed.      lenalidomide 10 MG capsule   Commonly known as: REVLIMID   Take 10 mg by mouth See admin instructions. Take 1 po daily x 7 days on,  then 7 days off.      lisinopril 2.5 MG tablet   Commonly known as: PRINIVIL,ZESTRIL   Take 1 tablet (2.5 mg total) by mouth daily.   Start taking on: 05/06/2012      magic mouthwash Soln   Take by mouth as needed.      Magnesium 400 MG Caps   Take 1 capsule by mouth 4 (four) times daily.      metFORMIN 500 MG tablet   Commonly known as: GLUCOPHAGE   Take 500 mg by mouth 2 (two) times daily with a meal.      mirtazapine 15 MG tablet   Commonly known as: REMERON   Take 15 mg by mouth at bedtime.      pantoprazole 40 MG tablet   Commonly known as: PROTONIX   Take 40 mg by mouth daily.      PREVIDENT 5000 PLUS DT   Place onto teeth at bedtime.      prochlorperazine 10 MG tablet   Commonly known as: COMPAZINE   Take 10 mg by mouth every 6 (six) hours as needed. For nausea      spironolactone 12.5 mg Tabs   Commonly known as: ALDACTONE   Take 0.5 tablets (12.5 mg total) by mouth daily.   Start taking  on: 05/07/2012      SYSTANE OP   Place 2 drops into both eyes daily.      TYLENOL ARTHRITIS EXT RELIEF PO   Take by mouth.           Follow-up Information    Follow up with Johny Blamer, MD. Schedule an appointment as soon as possible for a visit in 1 week.   Contact information:   [facility] [address] [place] [address] [phone]           The results of significant diagnostics from this hospitalization (including imaging, microbiology, ancillary and laboratory) are listed below for reference.    Significant Diagnostic Studies: Dg Chest 2 View  05/04/2012  *RADIOLOGY REPORT*  Clinical Data: Altered mental status, fever, history of carcinoma of the colon and multiple myeloma on chemotherapy, history hypertension, diabetes  CHEST - 2 VIEW  Comparison: 02/09/2010  Findings: Enlargement of cardiac silhouette post CABG. Calcified tortuous thoracic aorta. Pulmonary vascular congestion. Chronic scarring left base. Chronic peribronchial thickening. No pulmonary infiltrate, pleural effusion or pneumothorax. Diffuse osseous demineralization with prior vertebroplasties at adjacent levels of the lower thoracic spine. Additional chronic compression deformities of adjacent mid thoracic vertebrae again noted.  IMPRESSION: Enlargement of cardiac silhouette post CABG. Chronic bronchitic changes with left basilar scarring. No acute abnormalities. Osteoporosis with multiple thoracic spine compression fractures.   Original Report Authenticated By: Ulyses Southward, M.D.    Dg Thoracic Spine 2 View  05/04/2012  *RADIOLOGY REPORT*  Clinical Data: 78 year old female with back pain.  Multiple myeloma.  Fever, chemotherapy.  THORACIC SPINE - 2 VIEW  Comparison: Lumbar MRI 08/27/2011.  Chest radiographs 02/09/2010.  Findings: Interval augmentation of lower thoracic compression fractures which appear to be the T11-T12 levels.  Chronic T6 and T7 compression fractures.   Questionable mild T3 inferior endplate fracture which is new since 2011.  L1 inferior endplate mild compression deformity is new since April.  Cervicothoracic junction alignment is within normal limits. Sequelae median sternotomy.  Extensive aortic calcified atherosclerosis.  IMPRESSION: 1.    Evidence of mild T3 and L1 compression fractures which are new since 2011 and April 2013 respectively. If  specific therapy such as vertebroplasty is desired, MRI or whole body bone scan would best determine acuity. 2.  Interval augmentation of the T11 and T12 fractures.  Chronic T6 and T7 compression fractures.   Original Report Authenticated By: Erskine Speed, M.D.    Ct Head Wo Contrast  05/04/2012  *RADIOLOGY REPORT*  Clinical Data: Altered mental status.  CT HEAD WITHOUT CONTRAST  Technique:  Contiguous axial images were obtained from the base of the skull through the vertex without contrast.  Comparison: Head CT 07/16/2010.  Findings: Mild cerebral and cerebellar atrophy is again noted. There are patchy and confluent areas of decreased attenuation throughout the deep and periventricular white matter of the cerebral hemispheres bilaterally, most compatible with chronic microvascular ischemic changes.  Old lacunar infarction in the right basal ganglia is again noted.  No definite acute intracranial abnormalities.  Specifically, no definite signs to suggest acute/subacute cerebral ischemia, acute intracerebral hemorrhage, mass, mass effect, hydrocephalus or abnormal intra or extra-axial fluid collections.  Visualized paranasal sinuses and mastoids are well pneumatized.  No acute displaced skull fractures are identified.  IMPRESSION: 1.  No acute intracranial abnormalities. 2.  The appearance of the brain is unchanged, as discussed above.   Original Report Authenticated By: Trudie Reed, M.D.    Mr Thoracic Spine Wo Contrast  05/05/2012  *RADIOLOGY REPORT*  Clinical Data: T3 compression fracture.  MRI THORACIC SPINE  WITHOUT CONTRAST  Technique:  Multiplanar and multiecho pulse sequences of the thoracic spine were obtained without intravenous contrast.  Comparison: Thoracic spine radiographs 05/04/2010.  Findings: Normal signal is present throughout the thoracic spinal cord to the conus medullaris which terminates at L1.  The patient is status post vertebral augmentation at T11 and T12.  Chronic inferior endplate compression fractures at T6 and T7 demonstrates some residual marrow edema along the inferior endplate, compatible with incompletely healed fractures.  There is a slight depression in the T10 superior endplate with marrow edema compatible with a compression fracture.  The inferior endplate compression fracture at T3 is confirmed with marrow edema compatible with an acute / subacute inferior endplate compression fracture.  There is approximately 25% loss of height anteriorly relative to the adjacent levels.  Bilateral pleural effusions are present.  Mild airspace disease the bases likely reflects atelectasis.  Mild disc bulging is present at to T1-2 and T2-3 without significant stenosis.  Multilevel facet hypertrophy is present.  Mild disc herniations are present at the fracture levels to T6-7 and T C7-8.  Right foraminal stenosis is present at T7-8 secondary to facet hypertrophy.  Mild foraminal narrowing at T8-9 is worse on the left due to facet disease.  Slight anterolisthesis is present at T10-11.  Facet hypertrophy contributes to bilateral foraminal stenosis.  Slight anterolisthesis and uncovering of the disc is present at T11- 12.  Mild right foraminal narrowing is present.  A mild disc herniation is acentric to the left at T12-L1 without significant stenosis.  IMPRESSION:  1.  Confirmed acute / subacute inferior endplate compression fracture at T3 with approximately 25% loss of height. 2.  The remote inferior endplate fractures at T6 and T7 demonstrates some residual edema suggesting these are incompletely healed  or have continued to fracture.  3.  Superior endplate compression fracture at T10 with minimal loss of height. 4.  Status post vertebral augmentation at T11 and T12 without residual edema. 5.  Mild spondylosis of the thoracic spine as described. 6.  Small bilateral pleural effusions and associated atelectasis.   Original  Report Authenticated By: Marin Roberts, M.D.     Microbiology: Recent Results (from the past 240 hour(s))  URINE CULTURE     Status: Normal   Collection Time   05/04/12  2:24 PM      Component Value Range Status Comment   Specimen Description URINE, CATHETERIZED   Final    Special Requests Immunocompromised   Final    Culture  Setup Time 05/04/2012 19:04   Final    Colony Count NO GROWTH   Final    Culture NO GROWTH   Final    Report Status 05/05/2012 FINAL   Final      Labs: Basic Metabolic Panel:  Lab 05/05/12 2130 05/04/12 1500  NA 137 130*  K 4.1 4.2  CL 108 96  CO2 22 24  GLUCOSE 128* 79  BUN 14 21  CREATININE 0.97 1.21*  CALCIUM 8.3* 8.9  MG -- --  PHOS -- --   Liver Function Tests:  Lab 05/04/12 1500  AST 16  ALT 10  ALKPHOS 40  BILITOT 0.3  PROT 6.3  ALBUMIN 3.0*   CBC:  Lab 05/05/12 0604 05/04/12 1500 05/03/12 1132  WBC 2.1* 3.6* 5.4  NEUTROABS -- 1.9 2.6  HGB 8.3* 8.6* 9.9*  HCT 25.0* 25.5* 30.1*  MCV 99.6 98.8 100.3  PLT 87* 112* 148   CBG:  Lab 05/05/12 1321 05/05/12 0720 05/05/12 0035 05/04/12 2002 05/04/12 1859  GLUCAP 105* 79 102* 108* 60*     Signed:  Daysia Vandenboom  [facility] 05/05/2012, 3:40 PM

## 2012-05-10 LAB — CULTURE, BLOOD (ROUTINE X 2): Culture: NO GROWTH

## 2012-05-14 ENCOUNTER — Ambulatory Visit: Payer: Medicare Other

## 2012-05-15 ENCOUNTER — Telehealth: Payer: Self-pay

## 2012-05-15 NOTE — Telephone Encounter (Signed)
Message from [name] that pt did not do well after velcade on 12/19. She went to hospital overnight with dehydration, hypotension, possible TIA. [name] states she is improving but still very weak and having difficulty swallowing. They saw Dr Durwin Nora 12/31 and are awaiting lab results. [name] stated Christina Hopkins was too weak and worn out to have velcade on Jan 2. Her next velcade appt is Jan 16 and follow up with Dr Arline Asp is Jan 30. This message forwarded to Dr Arline Asp.

## 2012-05-17 ENCOUNTER — Other Ambulatory Visit: Payer: Medicare Other | Admitting: Lab

## 2012-05-17 ENCOUNTER — Ambulatory Visit: Payer: Medicare Other

## 2012-05-21 ENCOUNTER — Other Ambulatory Visit: Payer: Self-pay | Admitting: *Deleted

## 2012-05-21 MED ORDER — LENALIDOMIDE 10 MG PO CAPS: 10.0000 mg | ORAL_CAPSULE | ORAL | Status: DC

## 2012-05-21 NOTE — Telephone Encounter (Signed)
THIS REFILL REQUEST FOR REVLIMID WAS GIVEN TO DR.MURINSON'S NURSE, ROBIN BASS,RN. 

## 2012-05-22 ENCOUNTER — Other Ambulatory Visit: Payer: Self-pay | Admitting: *Deleted

## 2012-05-22 ENCOUNTER — Encounter: Payer: Self-pay | Admitting: *Deleted

## 2012-05-22 MED ORDER — ACYCLOVIR 400 MG PO TABS: 400.0000 mg | ORAL_TABLET | Freq: Two times a day (BID) | ORAL | Status: DC

## 2012-05-22 NOTE — Progress Notes (Signed)
RECEIVED A FAX FROM [facility] CONCERNING A CONFIRMATION OF FACSIM[facility] RECEIPT FOR PT. REFERRAL. 

## 2012-05-22 NOTE — Telephone Encounter (Signed)
Received a call from [facility] pharmacy to let us know that we e-scribed acyclovir to them instead of her regular pharmacy. Prescription refaxed to [facility].

## 2012-05-24 NOTE — Progress Notes (Signed)
RECEIVED A FAX FROM [facility] CONCERNING A CONFIRMATION OF PRESCRIPTION SHIPMENT FOR REVLIMID ON 05/23/12.

## 2012-05-28 ENCOUNTER — Telehealth: Payer: Self-pay

## 2012-05-28 NOTE — Telephone Encounter (Signed)
[name] called that pt has been having trouble swallowing, will choke on water or nabs, she is taking her meds with applesauce. She has a weak breathy voice. Pt had TIA in her problem list on her admit to hospital Dec 20. Pt has been seeing Dr Tiburcio Pea. [name] was asking about a swallowing sturdy. I told [name] to call back Dr Tiburcio Pea with this information. She agreed and said she valued our opinions. F/u at [facility] on 06/14/12.

## 2012-05-31 ENCOUNTER — Ambulatory Visit (HOSPITAL_BASED_OUTPATIENT_CLINIC_OR_DEPARTMENT_OTHER): Payer: Medicare Other

## 2012-05-31 ENCOUNTER — Other Ambulatory Visit (HOSPITAL_BASED_OUTPATIENT_CLINIC_OR_DEPARTMENT_OTHER): Payer: Medicare Other | Admitting: Lab

## 2012-05-31 VITALS — BP 109/58 | HR 69 | Temp 97.5°F | Resp 20

## 2012-05-31 DIAGNOSIS — C9000 Multiple myeloma not having achieved remission: Secondary | ICD-10-CM

## 2012-05-31 DIAGNOSIS — D519 Vitamin B12 deficiency anemia, unspecified: Secondary | ICD-10-CM

## 2012-05-31 DIAGNOSIS — Z5112 Encounter for antineoplastic immunotherapy: Secondary | ICD-10-CM

## 2012-05-31 DIAGNOSIS — D518 Other vitamin B12 deficiency anemias: Secondary | ICD-10-CM

## 2012-05-31 LAB — CBC WITH DIFFERENTIAL/PLATELET
BASO%: 1.1 % (ref 0.0–2.0)
Basophils Absolute: 0.1 10*3/uL (ref 0.0–0.1)
EOS%: 3.2 % (ref 0.0–7.0)
HGB: 10.7 g/dL — ABNORMAL LOW (ref 11.6–15.9)
MCH: 32.1 pg (ref 25.1–34.0)
MCV: 95.5 fL (ref 79.5–101.0)
MONO%: 18.4 % — ABNORMAL HIGH (ref 0.0–14.0)
NEUT#: 2.1 10*3/uL (ref 1.5–6.5)
RBC: 3.33 10*6/uL — ABNORMAL LOW (ref 3.70–5.45)
RDW: 15.4 % — ABNORMAL HIGH (ref 11.2–14.5)
lymph#: 2 10*3/uL (ref 0.9–3.3)
nRBC: 0 % (ref 0–0)

## 2012-05-31 MED ORDER — BORTEZOMIB CHEMO SQ INJECTION 3.5 MG (2.5MG/ML)
1.3000 mg/m2 | Freq: Once | INTRAMUSCULAR | Status: AC
  Administered 2012-05-31: 2 mg via SUBCUTANEOUS
  Filled 2012-05-31: qty 2

## 2012-05-31 MED ORDER — ONDANSETRON HCL 8 MG PO TABS
8.0000 mg | ORAL_TABLET | Freq: Once | ORAL | Status: AC
  Administered 2012-05-31: 8 mg via ORAL

## 2012-05-31 MED ORDER — CYANOCOBALAMIN 1000 MCG/ML IJ SOLN
1000.0000 ug | Freq: Once | INTRAMUSCULAR | Status: AC
  Administered 2012-05-31: 1000 ug via INTRAMUSCULAR

## 2012-05-31 NOTE — Patient Instructions (Addendum)
[facility] Discharge Instructions for Patients Receiving Chemotherapy  Today you received the following chemotherapy agents velcade  To help prevent nausea and vomiting after your treatment, we encourage you to take your nausea medication and take it as often as prescribed   If you develop nausea and vomiting that is not controlled by your nausea medication, call the clinic. If it is after clinic hours your family physician or the after hours number for the clinic or go to the Emergency Department.   BELOW ARE SYMPTOMS THAT SHOULD BE REPORTED IMMEDIATELY:  *FEVER GREATER THAN 100.5 F  *CHILLS WITH OR WITHOUT FEVER  NAUSEA AND VOMITING THAT IS NOT CONTROLLED WITH YOUR NAUSEA MEDICATION  *UNUSUAL SHORTNESS OF BREATH  *UNUSUAL BRUISING OR BLEEDING  TENDERNESS IN MOUTH AND THROAT WITH OR WITHOUT PRESENCE OF ULCERS  *URINARY PROBLEMS  *BOWEL PROBLEMS  UNUSUAL RASH Items with * indicate a potential emergency and should be followed up as soon as possible.  One of the nurses will contact you 24 hours after your treatment. Please let the nurse know about any problems that you may have experienced. Feel free to call the clinic you have any questions or concerns. The clinic phone number is [phone].   I have been informed and understand all the instructions given to me. I know to contact the clinic, my physician, or go to the Emergency Department if any problems should occur. I do not have any questions at this time, but understand that I may call the clinic during office hours   should I have any questions or need assistance in obtaining follow up care.    __________________________________________  _____________  __________ Signature of Patient or Authorized Representative            Date                   Time    __________________________________________ Nurse's Signature    

## 2012-05-31 NOTE — Progress Notes (Signed)
aranesp not indicated hgb 10.7.   dmr

## 2012-06-04 ENCOUNTER — Ambulatory Visit (HOSPITAL_COMMUNITY)
Admission: RE | Admit: 2012-06-04 | Discharge: 2012-06-04 | Disposition: A | Discharge status: Home / Self Care | Payer: Medicare Other | Source: Ambulatory Visit | Attending: Family Medicine | Admitting: Family Medicine

## 2012-06-04 DIAGNOSIS — R1310 Dysphagia, unspecified: Secondary | ICD-10-CM

## 2012-06-04 DIAGNOSIS — R498 Other voice and resonance disorders: Secondary | ICD-10-CM

## 2012-06-04 DIAGNOSIS — Z09 Encounter for follow-up examination after completed treatment for conditions other than malignant neoplasm: Secondary | ICD-10-CM

## 2012-06-04 NOTE — Procedures (Addendum)
Objective Swallowing Evaluation: Modified Barium Swallowing Study  Patient Details  Name: Christina Hopkins MRN: [id number] Date of Birth: [birth year]  Today's Date: 06/04/2012 Time: 4782-9562 SLP Time Calculation (min): 55 min  Past Medical History:  Past Medical History  Diagnosis Date  . colon ca dx'd 2003    surg only  . Multiple myeloma(203.0) dx'd 2009    oral chemo ongoing  . Hypertension   . Diabetes mellitus   . Anemia   . Osteoporosis   . Dyslipidemia   . Myocardial infarction    Past Surgical History:  Past Surgical History  Procedure Date  . Coronary artery bypass graft 08/2001    4 vessel  . Colon surgery 2003  . Tonsillectomy   . Appendectomy   . Abdominal hysterectomy   . Back surgery    HPI:  79 yo female with Multiple Myleoma undergoing chemotherapy referred for MBS due to pt's chronic hoarseness, breathy voice and dysphagia.  Daughter [name] reports onset of dysphagia AFTER pt was discharged from hospital when seen for with UTI, ? TIA, slurred speech event Dec 20th.  PSH + for esophageal dilations x3 - last being in approx 2002 per pt.  Pt has not had pulmonary infections nor significant weight loss per pt and daughter.       Assessment / Plan / Recommendation Clinical Impression  Dysphagia Diagnosis:  Severe oral phase dysphagia;Severe pharyngeal phase dysphagia;Moderate cervical esophageal phase dysphagia  Clinical impression:  Pt presents with moderately severe oropharyngeal dysphagia with OVERT aspiration of liquids and severe vallecular/pharyngeal stasis WITHOUT sensation.  Various postures attempted including head turn (left), chin tuck, dry swallows that were minimally effective to improve swallow.  Pt had to hock and expectorate throughout the test to remove vallecular stasis that was mixed with secretions:  Pt does not sense any oropharyngeal residuals or mild amount of aspiration.  Excessive orolingual movements that appear consistent with Tardive  Dyskinesia noted - ? source.   Aspiration of secretions is currently present and slp suspects aspiration may contribute to hoarseness.    Pt has not had severe weight loss nor pulmonary infections, therefore is tolerating aspiration of po and secretions and maintaining nutrition over the last month- as daughter stated this initiated Dec 20th.  SLP questions how long pt can tolerate this aspiration and source of severe dysphagia.   SLP ?s source of this pt's severe oropharyngeal dysphagia.   Extensive education completed with daughter and pt.      Treatment Recommendation       Diet Recommendation Nectar-thick liquid;Thin liquid (for better lung absorption of aspiration until see md)   Liquid Administration via: Cup Medication Administration: Crushed with puree (multiple swallows) Supervision: Full supervision/cueing for compensatory strategies Compensations: Multiple dry swallows after each bite/sip;Hard cough after swallow (expectorate throughout meal- small amount po a a time) Postural Changes and/or Swallow Maneuvers: Seated upright 90 degrees;Upright 30-60 min after meal    Other  Recommendations Recommended Consults: Other (Comment) (? consider neuro) Oral Care Recommendations: Oral care before and after PO   Follow Up Recommendations   (defer slp follow up until locate source of dysphagia)    Frequency and Duration   n/a         General Date of Onset: 06/04/12 HPI: 79 yo female with Multiple Myleoma undergoing chemotherapy referred for MBS due to pt's chronic hoarseness, breathy voice and dysphagia.  Daughter [name] reports onset of dysphagia AFTER pt was discharged from hospital when seen for with UTI, ? TIA,  slurred speech event Dec 20th.  PSH + for esophageal dilations x3 - last being in approx 2002 per pt.  Pt has not had pulmonary infections nor significant weight loss per pt and daughter.   Type of Study: Modified Barium Swallowing Study Reason for Referral: Objectively evaluate  swallowing function Diet Prior to this Study: Thin liquids;Regular Temperature Spikes Noted: No Respiratory Status: Room air Behavior/Cognition: Alert;Cooperative;Pleasant mood Oral Cavity - Dentition: Adequate natural dentition Oral Motor / Sensory Function: Impaired motor Oral impairment:  (excessive oro-lingual movement, ? source) Self-Feeding Abilities: Able to feed self Patient Positioning: Upright in chair Baseline Vocal Quality: Breathy;Hoarse;Low vocal intensity Volitional Cough: Strong Volitional Swallow: Unable to elicit (with 2nd attempt) Pharyngeal Secretions: Standing secretions in (comment) (throughout mixed with barium)    Reason for Referral Objectively evaluate swallowing function   Oral Phase Oral Preparation/Oral Phase Oral Phase: Impaired Oral - Nectar Oral - Nectar Cup: Lingual pumping;Lingual/palatal residue;Reduced posterior propulsion Oral - Thin Oral - Thin Teaspoon: Lingual pumping;Lingual/palatal residue;Reduced posterior propulsion Oral - Thin Cup: Lingual pumping;Lingual/palatal residue;Reduced posterior propulsion Oral - Thin Straw: Lingual pumping;Lingual/palatal residue;Reduced posterior propulsion Oral - Solids Oral - Puree: Lingual pumping;Lingual/palatal residue;Reduced posterior propulsion;Delayed oral transit;Piecemeal swallowing Oral - Regular: Lingual pumping;Lingual/palatal residue;Reduced posterior propulsion;Piecemeal swallowing;Delayed oral transit Oral Phase - Comment Oral Phase - Comment: pt spilled oral residuals that mixed with secretions into pharynx without sensation   Pharyngeal Phase Pharyngeal Phase Pharyngeal Phase: Impaired Pharyngeal - Nectar Pharyngeal - Nectar Teaspoon: Not tested Pharyngeal - Nectar Cup: Reduced tongue base retraction;Trace aspiration;Penetration/Aspiration after swallow;Reduced pharyngeal peristalsis Penetration/Aspiration details (nectar cup): Material enters airway, passes BELOW cords without attempt by  patient to eject out (silent aspiration) Pharyngeal - Thin Pharyngeal - Thin Teaspoon: Not tested Pharyngeal - Thin Cup: Reduced tongue base retraction;Reduced pharyngeal peristalsis;Compensatory strategies attempted (Comment) (excessive breath hold noted with swallow- not cued) Pharyngeal - Thin Straw: Reduced tongue base retraction;Pharyngeal residue - valleculae;Significant aspiration (Amount);Penetration/Aspiration after swallow;Penetration/Aspiration during swallow;Compensatory strategies attempted (Comment) (head turn left with and without chin tuck ) Penetration/Aspiration details (thin straw): Material enters airway, passes BELOW cords without attempt by patient to eject out (silent aspiration);Material enters airway, passes BELOW cords and not ejected out despite cough attempt by patient Pharyngeal - Solids Pharyngeal - Puree: Reduced tongue base retraction;Pharyngeal residue - valleculae Pharyngeal - Regular: Reduced tongue base retraction;Pharyngeal residue - valleculae;Pharyngeal residue - posterior pharnyx Pharyngeal Phase - Comment Pharyngeal Comment: pt with gross pharyngeal stasis without sensation, aspiration was significant with thin with head turn/chin tuck, multiple swallows minimally effective to decrease stasis - hocking barium clears pharynx but secretions would spill into pharynx uncontrolled  Cervical Esophageal Phase    GO    Cervical Esophageal Phase Cervical Esophageal Phase: Impaired Cervical Esophageal Phase - Comment Cervical Esophageal Comment: decreased clearance into esophagus, appearance of intermittent presently prominent cricopharyngeus, secretions mix with barium and are retained in pharynx    Functional Assessment Tool Used: mbs-clinical judgement Functional Limitations: Swallowing Swallow Current Status ([id number]): At least 80 percent but less than 100 percent impaired, limited or restricted Swallow Goal Status [phone]): At least 80 percent but less  than 100 percent impaired, limited or restricted Swallow Discharge Status [phone]): At least 80 percent but less than 100 percent impaired, limited or restricted    Donavan Burnet, MS [facility] SLP [phone]

## 2012-06-11 ENCOUNTER — Other Ambulatory Visit: Payer: Self-pay | Admitting: *Deleted

## 2012-06-11 MED ORDER — LENALIDOMIDE 10 MG PO CAPS: 10.0000 mg | ORAL_CAPSULE | ORAL | Status: DC

## 2012-06-11 NOTE — Telephone Encounter (Signed)
THIS REFILL REQUEST FOR REVLIMID WAS GIVEN TO DR.MURINSON'S NURSE, ROBIN BASS,RN. 

## 2012-06-12 NOTE — Telephone Encounter (Signed)
RECEIVED A FAX FROM [facility] CONCERNING A CONFIRMATION OF FACSIMILE RECEIPT FOR PT. REFERRAL. 

## 2012-06-14 ENCOUNTER — Encounter: Payer: Self-pay | Admitting: Physician Assistant

## 2012-06-14 ENCOUNTER — Telehealth: Payer: Self-pay | Admitting: Oncology

## 2012-06-14 ENCOUNTER — Other Ambulatory Visit: Payer: Self-pay | Admitting: Oncology

## 2012-06-14 ENCOUNTER — Other Ambulatory Visit (HOSPITAL_BASED_OUTPATIENT_CLINIC_OR_DEPARTMENT_OTHER): Payer: Medicare Other | Admitting: Lab

## 2012-06-14 ENCOUNTER — Telehealth: Payer: Self-pay | Admitting: *Deleted

## 2012-06-14 ENCOUNTER — Ambulatory Visit (HOSPITAL_BASED_OUTPATIENT_CLINIC_OR_DEPARTMENT_OTHER): Payer: Medicare Other | Admitting: Physician Assistant

## 2012-06-14 ENCOUNTER — Ambulatory Visit (HOSPITAL_BASED_OUTPATIENT_CLINIC_OR_DEPARTMENT_OTHER): Payer: Medicare Other

## 2012-06-14 VITALS — BP 96/54 | HR 67 | Temp 97.5°F | Resp 18 | Ht <= 58 in | Wt 109.3 lb

## 2012-06-14 DIAGNOSIS — E538 Deficiency of other specified B group vitamins: Secondary | ICD-10-CM

## 2012-06-14 DIAGNOSIS — D649 Anemia, unspecified: Secondary | ICD-10-CM

## 2012-06-14 DIAGNOSIS — R1310 Dysphagia, unspecified: Secondary | ICD-10-CM

## 2012-06-14 DIAGNOSIS — D519 Vitamin B12 deficiency anemia, unspecified: Secondary | ICD-10-CM

## 2012-06-14 DIAGNOSIS — D509 Iron deficiency anemia, unspecified: Secondary | ICD-10-CM

## 2012-06-14 DIAGNOSIS — Z5112 Encounter for antineoplastic immunotherapy: Secondary | ICD-10-CM

## 2012-06-14 DIAGNOSIS — C9000 Multiple myeloma not having achieved remission: Secondary | ICD-10-CM

## 2012-06-14 DIAGNOSIS — E559 Vitamin D deficiency, unspecified: Secondary | ICD-10-CM

## 2012-06-14 LAB — CBC WITH DIFFERENTIAL/PLATELET
BASO%: 2 % (ref 0.0–2.0)
Eosinophils Absolute: 0.2 10*3/uL (ref 0.0–0.5)
LYMPH%: 30.5 % (ref 14.0–49.7)
MCHC: 34 g/dL (ref 31.5–36.0)
MONO#: 0.8 10*3/uL (ref 0.1–0.9)
NEUT#: 2.1 10*3/uL (ref 1.5–6.5)
Platelets: 171 10*3/uL (ref 145–400)
RBC: 3 10*6/uL — ABNORMAL LOW (ref 3.70–5.45)
RDW: 17.5 % — ABNORMAL HIGH (ref 11.2–14.5)
WBC: 4.5 10*3/uL (ref 3.9–10.3)
lymph#: 1.4 10*3/uL (ref 0.9–3.3)

## 2012-06-14 LAB — COMPREHENSIVE METABOLIC PANEL (CC13)
ALT: 15 U/L (ref 0–55)
AST: 19 U/L (ref 5–34)
Albumin: 3.3 g/dL — ABNORMAL LOW (ref 3.5–5.0)
Alkaline Phosphatase: 56 U/L (ref 40–150)
BUN: 20.1 mg/dL (ref 7.0–26.0)
Calcium: 9.6 mg/dL (ref 8.4–10.4)
Chloride: 99 mEq/L (ref 98–107)
Potassium: 4.6 mEq/L (ref 3.5–5.1)
Sodium: 134 mEq/L — ABNORMAL LOW (ref 136–145)

## 2012-06-14 MED ORDER — BORTEZOMIB CHEMO SQ INJECTION 3.5 MG (2.5MG/ML)
1.3000 mg/m2 | Freq: Once | INTRAMUSCULAR | Status: AC
  Administered 2012-06-14: 2 mg via SUBCUTANEOUS
  Filled 2012-06-14: qty 2

## 2012-06-14 MED ORDER — ONDANSETRON HCL 8 MG PO TABS
8.0000 mg | ORAL_TABLET | Freq: Once | ORAL | Status: AC
  Administered 2012-06-14: 8 mg via ORAL

## 2012-06-14 NOTE — Progress Notes (Signed)
[facility]  Telephone:([phone]) [phone]   OFFICE PROGRESS NOTE  Johny Blamer, MD  PATIENT IDENTIFICATION: 1. Multiple myeloma, initially presenting as an IgA kappa monoclonal gammopathy in February 2009. 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 This was felt to be an adverse prognostic determinant. Bone marrow on 08/31/2007 showed 46% plasma cells. A repeat bone marrow on 08/05/2009 showed 73% plasma cells. Treatment with Revlimid was started in April 2011. Her last course began on 1/27, 10 mg po qd, 7 days on, and 7 days off.  Zometa was started in October 2011.  Most recent metastatic bone survey was on 08/05/2009. Other x-rays since then are available.  Maximum IgA level was 2080 on 08/11/2009.  Subcutaneous Velcade was added to the patient's treatment program on 06/14/2011 because of a rising IgA level 1080 on 06/02/2011. IgA on 04/19/2012 was 704 (on 02/23/12 was 449). The patient is receiving Velcade every 2 weeks in combination with Revlimid being given 10 mg 1 week on 1 week off and Aranesp 300 mcg subcu every 4 weeks for hemoglobin less than or equal to 10. Last Velcade (C18, D1) was on 05/03/2012. She did not receive C19 on 1/2  due to hospitalization.The patient has completed a 2-year course of Zometa.  2. Anemia secondary to vitamin B12 deficiency. The patient had been on oral vitamin B12. However, her vitamin B12 level fell to 284 on 07/26/2011 and vitamin B12 shots 1000 mcg given IM every month was resumed on 09/06/2011. Last dose was received on 05/31/2012 3. Anemia secondary to iron deficiency.  4. Vitamin D deficiency.  5. Magnesium deficiency.  6. Diabetes mellitus.  7. Hypertension.  8. Dyslipidemia.  9. Coronary artery disease.  10.Right anterior thigh mass, most likely a lipoma.  11.Multiple vertebral compression fractures as seen on MRI of the lumbar spine on 08/27/2011. Kyphoplasty was carried out at L5, T11 and T12 on 10/07/2011 by Dr. Julieanne Cotton.  12. Dysphagia, Barium swallow was performed on 06/04/12. Appears to have a Neurological etiology. Patient has been referred to [facility] due to aspiration risk.   INTERVAL HISTORY:  Keundra Petrucelli today for followup of her IgA kappa multiple myeloma. Mrs. Sponsel was accompanied by her daughter [name]. She was last seen by [place] on 12/05 /2013. Mrs. Samet has been on Revlimid 10 mg daily, 1 week on and 1 week off. She just started Revlimid again on 06/11/2012. She also receives Velcade 2.0 mg subcutaneously every 2 weeks. This was last administered on 05/03/2012. Zometa was last given on 02/23/2012, and the patient has completed a 2-year course. We will hold on further Zometa. Patient also receives vitamin B12 injections 1,000 units every month. These were restarted on 09/06/2011. Last dose on 05/31/2012. Finally, the patient receives Aranesp 300 mcg subcu every 4 weeks whenever the hemoglobin is less than or equal to 10.  The patient has not required any Aranesp since 01/26/2012, when the hemoglobin was 9.5. Today is 10.1 with Hct 29.7 She has been hospitalized on 12/20 due to increased confusion, dysarthria and low grade fever. CT ogf the head was negative. Dopplers revealed 40 percent LICA stenosis, no RICAS. Status complicated SHe was diagnosed with TIA. Ba Swallow on 1/20 shows patient to be at risk for aspiration, likely neurological etiology. Patient to have Neuro evaluation next week.She has lost weight in the process, since she is only consuming semiliquid foods. Weight dropped from 213 to 209 lobs today.  Of note, an MRI T spine  on 12/21 showed new compression fracture at T3 to be monitored. NewVit D levels pending. She is returning for her Velcade dose today.     MEDICATIONS: Current Outpatient Prescriptions  Medication Sig Dispense Refill  . Acetaminophen (TYLENOL ARTHRITIS EXT RELIEF PO) Take by mouth.      Marland Kitchen acyclovir (ZOVIRAX) 400 MG tablet Take 1 tablet (400  mg total) by mouth 2 (two) times daily.  60 tablet  3  . Alum & Mag Hydroxide-Simeth (MAGIC MOUTHWASH) SOLN Take by mouth as needed.      Marland Kitchen aspirin 81 MG tablet Take 81 mg by mouth daily.        Marland Kitchen atorvastatin (LIPITOR) 40 MG tablet Take 40 mg by mouth daily.        . carvedilol (COREG) 6.25 MG tablet Take 1 tablet (6.25 mg total) by mouth 2 (two) times daily with a meal.  60 tablet  0  . cholecalciferol (VITAMIN D) 1000 UNITS tablet Take 2,000-5,000 Units by mouth 2 (two) times daily. 5000 units at AM and 2000 units at night      . cyclobenzaprine (FLEXERIL) 10 MG tablet Take 10 mg by mouth 3 (three) times daily as needed. For muscle spasm      . HYDROcodone-acetaminophen (NORCO) 10-325 MG per tablet Take 1 tablet by mouth every 6 (six) hours as needed.      Marland Kitchen lenalidomide (REVLIMID) 10 MG capsule Take 1 capsule (10 mg total) by mouth See admin instructions. Take 1 po daily x 7 days on, then 7 days off.  14 capsule  0  . lisinopril (PRINIVIL,ZESTRIL) 2.5 MG tablet Take 1 tablet (2.5 mg total) by mouth daily.  30 tablet  0  . Magnesium 400 MG CAPS Take 1 capsule by mouth 4 (four) times daily.  120 capsule  4  . metFORMIN (GLUCOPHAGE) 500 MG tablet Take 500 mg by mouth 2 (two) times daily with a meal.        . mirtazapine (REMERON) 15 MG tablet Take 15 mg by mouth at bedtime.        . Multiple Vitamins-Minerals (CENTRUM SILVER PO) Take 1 tablet by mouth daily.       . pantoprazole (PROTONIX) 40 MG tablet Take 40 mg by mouth daily.        Bertram Gala Glycol-Propyl Glycol (SYSTANE OP) Place 2 drops into both eyes daily.      . prochlorperazine (COMPAZINE) 10 MG tablet Take 10 mg by mouth every 6 (six) hours as needed. For nausea      . Sodium Fluoride (PREVIDENT 5000 PLUS DT) Place onto teeth at bedtime.      Marland Kitchen spironolactone (ALDACTONE) 12.5 mg TABS Take 0.5 tablets (12.5 mg total) by mouth daily.  30 tablet  0   No current facility-administered medications for this visit.   Facility-Administered  Medications Ordered in Other Visits  Medication Dose Route Frequency Provider Last Rate Last Dose  . darbepoetin (ARANESP) injection 300 mcg  300 mcg Subcutaneous Once Samul Dada, MD        ALLERGIES:  is allergic to gemfibrozil; nifedipine; and questran.  IMMUNIZATIONS:   1. Pneumovax was apparently given about 9 years ago when the patient  was 79 years old.  2. Flu shot was given on 01/27/2012.   REVIEW OF SYSTEMS:  The rest of the 14-point review of system was negative.   There were no vitals filed for this visit. Wt Readings from Last 3 Encounters:  05/04/12 120 lb 5.9  oz (54.6 kg)  04/19/12 113 lb (51.256 kg)  02/23/12 111 lb 9.6 oz (50.621 kg)    PHYSICAL EXAMINATION:  There is no scleral icterus. Mouth benign. Tongue slightly deviated to the left, with minor involuntary movements. Uvula midline. No adenopathy. 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 Patient does not have a Port-A-Cath or central catheter. Lungs: Some end inspiratory rales at the bases. Cardiac: Regular rhythm without murmur or rub. Abdomen: With the patient sitting is benign. Extremities: No peripheral edema or clubbing. The patient has a rolling walker. She has a lipoma involving the right lower proximal thigh, which is stable.     LABORATORY/RADIOLOGY DATA:   Lab 06/14/12 1052  WBC 4.5  HGB 10.1*  HCT 29.7*  PLT 171  MCV 98.9  MCH 33.6  MCHC 34.0  RDW 17.5*  LYMPHSABS 1.4  MONOABS 0.8  EOSABS 0.2  BASOSABS 0.1  BANDABS --    CMP   No results found for this basename: NA:5,K:5,CL:5,CO2:5,GLUCOSE:5,BUN:5,CREATININE:5,GFRCGP,:5,CALCIUM:5,MG:5,AST:5,ALT:5,ALKPHOS:5,BILITOT:5 in the last 168 hours      Component Value Date/Time   BILITOT 0.3 05/04/2012 1500   BILITOT 0.44 04/19/2012 0826   On 04/19/2012  LDH was 146 (129 on 02/23/12); IgA level 704 (449), B12 531 (720) Vit D 50 (35). New values are pending  Radiology  Studies:  Dg Swallowing Func-speech Pathology  06/04/2012   Dysphagia Diagnosis:  Severe oral phase dysphagia;Severe pharyngeal phase  dysphagia;Moderate cervical esophageal phase dysphagia  Clinical impression:  Pt presents with moderately severe oropharyngeal dysphagia with  OVERT aspiration of liquids and severe vallecular/pharyngeal  stasis WITHOUT sensation.  Various postures attempted including  head turn (left), chin tuck, dry swallows that were minimally  effective to improve swallow.  Pt had to hock and expectorate  throughout the test to remove vallecular stasis that was mixed  with secretions:  Pt does not sense any oropharyngeal residuals  or mild amount of aspiration.  Excessive orolingual movements  that appear consistent with Tardive Dyskinesia noted - ? source.    Aspiration of secretions is currently present and slp suspects  aspiration may contribute to hoarseness.    Pt has not had severe weight loss nor pulmonary infections,  therefore is tolerating aspiration of po and secretions and  maintaining nutrition over the last month- as daughter stated  this initiated Dec 20th.  SLP questions how long pt can tolerate  this aspiration and source of severe dysphagia.   SLP ?s source  of this pt's severe oropharyngeal dysphagia.   Extensive  education completed with daughter and pt.      Treatment Recommendation       Diet Recommendation Nectar-thick liquid;Thin liquid (for better  lung absorption of aspiration until see md)   Liquid Administration via: Cup Medication Administration: Crushed with puree (multiple swallows) Supervision: Full supervision/cueing for compensatory strategies Compensations: Multiple dry swallows after each bite/sip;Hard  cough after swallow (expectorate throughout meal- small amount po  a a time) Postural Changes and/or Swallow Maneuvers: Seated upright 90  degrees;Upright 30-60 min after meal    Other  Recommendations Recommended Consults: Other (Comment) (?  consider neuro)  Oral Care Recommendations: Oral care before and after PO   Follow Up Recommendations   (defer slp follow up until locate source of dysphagia)    Frequency and Duration   n/a         General Date of Onset: 06/04/12 HPI: 79 yo female with Multiple Myleoma  undergoing chemotherapy  referred for MBS due to pt's chronic hoarseness, breathy voice  and dysphagia.  Daughter [name] reports onset of dysphagia AFTER pt  was discharged from hospital when seen for with UTI, ? TIA,  slurred speech event Dec 20th.  PSH + for esophageal dilations x3  - last being in approx 2002 per pt.  Pt has not had pulmonary  infections nor significant weight loss per pt and daughter.   Type of Study: Modified Barium Swallowing Study Reason for Referral: Objectively evaluate swallowing function Diet Prior to this Study: Thin liquids;Regular Temperature Spikes Noted: No Respiratory Status: Room air Behavior/Cognition: Alert;Cooperative;Pleasant mood Oral Cavity - Dentition: Adequate natural dentition Oral Motor / Sensory Function: Impaired motor Oral impairment:  (excessive oro-lingual movement, ? source) Self-Feeding Abilities: Able to feed self Patient Positioning: Upright in chair Baseline Vocal Quality: Breathy;Hoarse;Low vocal intensity Volitional Cough: Strong Volitional Swallow: Unable to elicit (with 2nd attempt) Pharyngeal Secretions: Standing secretions in (comment)  (throughout mixed with barium)    Reason for Referral Objectively evaluate swallowing function   Oral Phase Oral Preparation/Oral Phase Oral Phase: Impaired Oral - Nectar Oral - Nectar Cup: Lingual pumping;Lingual/palatal  residue;Reduced posterior propulsion Oral - Thin Oral - Thin Teaspoon: Lingual pumping;Lingual/palatal  residue;Reduced posterior propulsion Oral - Thin Cup: Lingual pumping;Lingual/palatal residue;Reduced  posterior propulsion Oral - Thin Straw: Lingual pumping;Lingual/palatal  residue;Reduced posterior propulsion Oral - Solids Oral - Puree: Lingual  pumping;Lingual/palatal residue;Reduced  posterior propulsion;Delayed oral transit;Piecemeal swallowing Oral - Regular: Lingual pumping;Lingual/palatal residue;Reduced  posterior propulsion;Piecemeal swallowing;Delayed oral transit Oral Phase - Comment Oral Phase - Comment: pt spilled oral residuals that mixed with  secretions into pharynx without sensation   Pharyngeal Phase Pharyngeal Phase Pharyngeal Phase: Impaired Pharyngeal - Nectar Pharyngeal - Nectar Teaspoon: Not tested Pharyngeal - Nectar Cup: Reduced tongue base retraction;Trace  aspiration;Penetration/Aspiration after swallow;Reduced  pharyngeal peristalsis Penetration/Aspiration details (nectar cup): Material enters  airway, passes BELOW cords without attempt by patient to eject  out (silent aspiration) Pharyngeal - Thin Pharyngeal - Thin Teaspoon: Not tested Pharyngeal - Thin Cup: Reduced tongue base retraction;Reduced  pharyngeal peristalsis;Compensatory strategies attempted  (Comment) (excessive breath hold noted with swallow- not cued) Pharyngeal - Thin Straw: Reduced tongue base  retraction;Pharyngeal residue - valleculae;Significant aspiration  (Amount);Penetration/Aspiration after  swallow;Penetration/Aspiration during swallow;Compensatory  strategies attempted (Comment) (head turn left with and without  chin tuck ) Penetration/Aspiration details (thin straw): Material enters  airway, passes BELOW cords without attempt by patient to eject  out (silent aspiration);Material enters airway, passes BELOW  cords and not ejected out despite cough attempt by patient Pharyngeal - Solids Pharyngeal - Puree: Reduced tongue base retraction;Pharyngeal  residue - valleculae Pharyngeal - Regular: Reduced tongue base retraction;Pharyngeal  residue - valleculae;Pharyngeal residue - posterior pharnyx Pharyngeal Phase - Comment Pharyngeal Comment: pt with gross pharyngeal stasis without  sensation, aspiration was significant with thin with head  turn/chin tuck,  multiple swallows minimally effective to decrease  stasis - hocking barium clears pharynx but secretions would spill  into pharynx uncontrolled  Cervical Esophageal Phase    GO    Cervical Esophageal Phase Cervical Esophageal Phase: Impaired Cervical Esophageal Phase - Comment Cervical Esophageal Comment: decreased clearance into esophagus,  appearance of intermittent presently prominent cricopharyngeus,  secretions mix with barium and are retained in pharynx    Functional Assessment Tool Used: mbs-clinical judgement Functional Limitations: Swallowing Swallow Current Status (W0981): At least 80 percent but less than  100 percent impaired, limited or restricted Swallow Goal Status [id number]): At least 80  percent but less than  100 percent impaired, limited or restricted Swallow Discharge Status [phone]): At least 80 percent but less  than 100 percent impaired, limited or restricted    Donavan Burnet, MS [facility] SLP [phone]      1. Most recent metastatic bone survey was on 08/05/2009. Prior to that, we had a metastatic bone survey from 11/03/2008 and 07/23/2007. The metastatic bone survey from 08/05/2009 stated that there were no findings to strongly suggest metastatic disease. There were multiple sites of degenerative change.  2. Chest x-ray from 02/09/2010 showed new or increased T6 and T7 compression fractures compared with the skeletal survey of 08/05/2009.  3. CT scan of head without IV contrast showed no acute findings. The calvarium was intact. There was a probable old lacunar infarct in the right basal ganglia.  4. There are x-rays of the left hip and lumbar spine from 07/16/2010. There are x-rays of the left humerus and cervical spine from 06/30/2010.  5. An ultrasound of the right lower extremity was carried out on 08/18/2010 and did not show a discrete mass in the right thigh.  6. Screening mammogram from 02/01/2011 carried out at [facility] was negative.  7. MRI of the lumbar spine with and  without IV contrast on 08/27/2011 showed acute/subacute superior endplate fracture of L5 with minimal  loss of vertebral body height and no significant retropulsion.  There was a nonhealed superior endplate compression fracture at T11 and T12 with associated kyphosis. There was central canal stenosis  greatest at L4-5, foraminal stenosis greatest at L2-3 and L3-4, and nonhealed sacral insufficiency fractures. There was multilevel spondylosis of the lumbar spine.  8. Kyphoplasty at L5, T11 and T12 carried out on 10/07/2011 by Dr. Julieanne Cotton was carried out successfully. Biopsies showed blood clot and hypercellular marrow with increased plasma cell infiltrates at L5, T11 and T12.    ASSESSMENT AND PLAN:     Mrs. Wolven  Returns today prior to her Velcade C 19.  There is concern about the increase in her IgA level to 704 on 04/19/2012, whereas it had been in the 200 range dating back to May. The patient had been receiving Velcade every 2 weeks, and we increased the Velcade interval to every 4 weeks. Interval shortened to every 2 weeks if it appears that the IgA level is rising. Mrs. Shisler is due for Velcade 2 mg subcu today.She will also receive a  vitamin B12 injection 1,000 mcg IM. Patient does not need Aranesp today, as her hemoglobin is greater than 10, which is our cutoff value.  Mrs. Duffy is taking Revlimid 10 mg daily, 1 week on and 1 week off. She will return on Feb 27 for CBC and to receive Velcade, vitamin  B12. She will also receive Aranesp if her hemoglobin is less than or equal to 10. We will plan to see Mrs. Wence again around Feb 13, at which time we will check CBC, chemistries, IgA level, and vitamin D level.

## 2012-06-14 NOTE — Patient Instructions (Signed)
Lab check with CBC and CMET prior to Velcade and Aranesp on 2/13 or as indicated by Dr. Arline Asp in Care plan.  Labs and follow up appt with Dr. Arline Asp on 2/27

## 2012-06-14 NOTE — Telephone Encounter (Signed)
gv and printed appt schedule for Feb...emailed Michelle to add tx..the patient aware

## 2012-06-14 NOTE — Patient Instructions (Addendum)
[facility] Discharge Instructions for Patients Receiving Chemotherapy  Today you received the following chemotherapy agents Velcade.  To help prevent nausea and vomiting after your treatment, we encourage you to take your nausea medication as prescribed.   If you develop nausea and vomiting that is not controlled by your nausea medication, call the clinic. If it is after clinic hours your family physician or the after hours number for the clinic or go to the Emergency Department.   BELOW ARE SYMPTOMS THAT SHOULD BE REPORTED IMMEDIATELY:  *FEVER GREATER THAN 100.5 F  *CHILLS WITH OR WITHOUT FEVER  NAUSEA AND VOMITING THAT IS NOT CONTROLLED WITH YOUR NAUSEA MEDICATION  *UNUSUAL SHORTNESS OF BREATH  *UNUSUAL BRUISING OR BLEEDING  TENDERNESS IN MOUTH AND THROAT WITH OR WITHOUT PRESENCE OF ULCERS  *URINARY PROBLEMS  *BOWEL PROBLEMS  UNUSUAL RASH Items with * indicate a potential emergency and should be followed up as soon as possible.  Feel free to call the clinic you have any questions or concerns. The clinic phone number is [phone].   I have been informed and understand all the instructions given to me. I know to contact the clinic, my physician, or go to the Emergency Department if any problems should occur. I do not have any questions at this time, but understand that I may call the clinic during office hours   should I have any questions or need assistance in obtaining follow up care.    __________________________________________  _____________  __________ Signature of Patient or Authorized Representative            Date                   Time    __________________________________________ Nurse's Signature    

## 2012-06-14 NOTE — Telephone Encounter (Signed)
Per staff message and POF I have scheduled appts.  [name]  

## 2012-06-15 ENCOUNTER — Other Ambulatory Visit: Payer: Self-pay | Admitting: Certified Registered Nurse Anesthetist

## 2012-06-15 LAB — VITAMIN D 25 HYDROXY (VIT D DEFICIENCY, FRACTURES): Vit D, 25-Hydroxy: 59 ng/mL (ref 30–89)

## 2012-06-15 LAB — IGA: IgA: 806 mg/dL — ABNORMAL HIGH (ref 69–380)

## 2012-06-15 NOTE — Telephone Encounter (Signed)
RECEIVED A FAX FROM [employer] CONCERNING A CONFIRMATION OF PRESCRIPTION SHIPMENT FOR REVLIMID ON 06/14/12.

## 2012-06-27 ENCOUNTER — Telehealth: Payer: Self-pay

## 2012-06-27 ENCOUNTER — Other Ambulatory Visit: Payer: Self-pay | Admitting: Oncology

## 2012-06-27 NOTE — Telephone Encounter (Signed)
Christina Hopkins left message that she will not be able to bring pt 05/28/12 d/t snow. Message passed to infusion room.

## 2012-06-28 ENCOUNTER — Ambulatory Visit: Payer: Medicare Other

## 2012-06-28 ENCOUNTER — Other Ambulatory Visit: Payer: Medicare Other | Admitting: Lab

## 2012-06-29 ENCOUNTER — Telehealth: Payer: Self-pay | Admitting: Oncology

## 2012-06-29 NOTE — Telephone Encounter (Signed)
S/w re new time for 830am on 2/27. Pt will get new schedule 2/19. Message to [place] to adjust tx if poss.

## 2012-07-02 ENCOUNTER — Other Ambulatory Visit: Payer: Self-pay | Admitting: *Deleted

## 2012-07-02 DIAGNOSIS — D630 Anemia in neoplastic disease: Secondary | ICD-10-CM

## 2012-07-02 NOTE — Telephone Encounter (Signed)
THIS REFILL REQUEST FOR REVLIMID WAS GIVEN TO DR.MURINSON'S NURSE, ROBIN BASS,RN. 

## 2012-07-03 MED ORDER — LENALIDOMIDE 10 MG PO CAPS: 10.0000 mg | ORAL_CAPSULE | ORAL | Status: DC

## 2012-07-03 NOTE — Addendum Note (Signed)
Addended by: Arvilla Meres on: 07/03/2012 11:24 AM   Modules accepted: Orders

## 2012-07-03 NOTE — Telephone Encounter (Signed)
RECEIVED A FAX FROM [facility] CONCERNING A CONFIRMATION OF FACSIMILE RECEIPT. 

## 2012-07-04 ENCOUNTER — Ambulatory Visit (HOSPITAL_BASED_OUTPATIENT_CLINIC_OR_DEPARTMENT_OTHER): Payer: Medicare Other

## 2012-07-04 ENCOUNTER — Other Ambulatory Visit: Payer: Medicare Other | Admitting: Lab

## 2012-07-04 VITALS — BP 96/60 | HR 67 | Temp 98.7°F

## 2012-07-04 DIAGNOSIS — D518 Other vitamin B12 deficiency anemias: Secondary | ICD-10-CM

## 2012-07-04 DIAGNOSIS — I2510 Atherosclerotic heart disease of native coronary artery without angina pectoris: Secondary | ICD-10-CM

## 2012-07-04 LAB — COMPREHENSIVE METABOLIC PANEL (CC13)
Alkaline Phosphatase: 59 U/L (ref 40–150)
CO2: 29 mEq/L (ref 22–29)
Creatinine: 1.1 mg/dL (ref 0.6–1.1)
Glucose: 127 mg/dl — ABNORMAL HIGH (ref 70–99)
Total Bilirubin: 0.41 mg/dL (ref 0.20–1.20)

## 2012-07-04 LAB — CBC WITH DIFFERENTIAL/PLATELET
BASO%: 1.1 % (ref 0.0–2.0)
Eosinophils Absolute: 0.1 10*3/uL (ref 0.0–0.5)
HCT: 28.4 % — ABNORMAL LOW (ref 34.8–46.6)
LYMPH%: 34 % (ref 14.0–49.7)
MCHC: 32.9 g/dL (ref 31.5–36.0)
MCV: 101.5 fL — ABNORMAL HIGH (ref 79.5–101.0)
MONO#: 0.3 10*3/uL (ref 0.1–0.9)
MONO%: 9 % (ref 0.0–14.0)
NEUT%: 51.2 % (ref 38.4–76.8)
Platelets: 117 10*3/uL — ABNORMAL LOW (ref 145–400)
WBC: 3.1 10*3/uL — ABNORMAL LOW (ref 3.9–10.3)

## 2012-07-04 MED ORDER — BORTEZOMIB CHEMO SQ INJECTION 3.5 MG (2.5MG/ML)
1.3000 mg/m2 | Freq: Once | INTRAMUSCULAR | Status: AC
  Administered 2012-07-04: 2 mg via SUBCUTANEOUS
  Filled 2012-07-04: qty 2

## 2012-07-04 MED ORDER — CYANOCOBALAMIN 1000 MCG/ML IJ SOLN
1000.0000 ug | Freq: Once | INTRAMUSCULAR | Status: AC
  Administered 2012-07-04: 1000 ug via INTRAMUSCULAR

## 2012-07-04 MED ORDER — DARBEPOETIN ALFA-POLYSORBATE 500 MCG/ML IJ SOLN
300.0000 ug | Freq: Once | INTRAMUSCULAR | Status: AC
  Administered 2012-07-04: 300 ug via SUBCUTANEOUS
  Filled 2012-07-04: qty 1

## 2012-07-04 MED ORDER — ONDANSETRON HCL 8 MG PO TABS
8.0000 mg | ORAL_TABLET | Freq: Once | ORAL | Status: AC
  Administered 2012-07-04: 8 mg via ORAL

## 2012-07-04 NOTE — Patient Instructions (Signed)
[facility] Discharge Instructions for Patients Receiving Chemotherapy  Today you received the following chemotherapy agents Velcade To help prevent nausea and vomiting after your treatment, we encourage you to take your nausea medication as prescribed. If you develop nausea and vomiting that is not controlled by your nausea medication, call the clinic. If it is after clinic hours your family physician or the after hours number for the clinic or go to the Emergency Department.   BELOW ARE SYMPTOMS THAT SHOULD BE REPORTED IMMEDIATELY:  *FEVER GREATER THAN 100.5 F  *CHILLS WITH OR WITHOUT FEVER  NAUSEA AND VOMITING THAT IS NOT CONTROLLED WITH YOUR NAUSEA MEDICATION  *UNUSUAL SHORTNESS OF BREATH  *UNUSUAL BRUISING OR BLEEDING  TENDERNESS IN MOUTH AND THROAT WITH OR WITHOUT PRESENCE OF ULCERS  *URINARY PROBLEMS  *BOWEL PROBLEMS  UNUSUAL RASH Items with * indicate a potential emergency and should be followed up as soon as possible.  One of the nurses will contact you 24 hours after your treatment. Please let the nurse know about any problems that you may have experienced. Feel free to call the clinic you have any questions or concerns. The clinic phone number is [phone].   I have been informed and understand all the instructions given to me. I know to contact the clinic, my physician, or go to the Emergency Department if any problems should occur. I do not have any questions at this time, but understand that I may call the clinic during office hours   should I have any questions or need assistance in obtaining follow up care.    __________________________________________  _____________  __________ Signature of Patient or Authorized Representative            Date                   Time    __________________________________________ Nurse's Signature     Darbepoetin Alfa injection What is this medicine? DARBEPOETIN ALFA (dar be POE e tin AL fa) helps your  body make more red blood cells. It is used to treat anemia caused by chronic kidney failure and chemotherapy. This medicine may be used for other purposes; ask your health care provider or pharmacist if you have questions. What should I tell my health care provider before I take this medicine? They need to know if you have any of these conditions: -blood clotting disorders or history of blood clots -cancer patient not on chemotherapy -cystic fibrosis -heart disease, such as angina, heart failure, or a history of a heart attack -hemoglobin level of 12 g/dL or greater -high blood pressure -low levels of folate, iron, or vitamin B12 -seizures -an unusual or allergic reaction to darbepoetin, erythropoietin, albumin, hamster proteins, latex, other medicines, foods, dyes, or preservatives -pregnant or trying to get pregnant -breast-feeding How should I use this medicine? This medicine is for injection into a vein or under the skin. It is usually given by a health care professional in a hospital or clinic setting. If you get this medicine at home, you will be taught how to prepare and give this medicine. Do not shake the solution before you withdraw a dose. Use exactly as directed. Take your medicine at regular intervals. Do not take your medicine more often than directed. It is important that you put your used needles and syringes in a special sharps container. Do not put them in a trash can. If you do not have a sharps container, call your pharmacist or healthcare provider to  get one. Talk to your pediatrician regarding the use of this medicine in children. While this medicine may be used in children as young as 1 year for selected conditions, precautions do apply. Overdosage: If you think you have taken too much of this medicine contact a poison control center or emergency room at once. NOTE: This medicine is only for you. Do not share this medicine with others. What if I miss a dose? If you miss  a dose, take it as soon as you can. If it is almost time for your next dose, take only that dose. Do not take double or extra doses. What may interact with this medicine? Do not take this medicine with any of the following medications: -epoetin alfa This list may not describe all possible interactions. Give your health care provider a list of all the medicines, herbs, non-prescription drugs, or dietary supplements you use. Also tell them if you smoke, drink alcohol, or use illegal drugs. Some items may interact with your medicine. What should I watch for while using this medicine? Visit your prescriber or health care professional for regular checks on your progress and for the needed blood tests and blood pressure measurements. It is especially important for the doctor to make sure your hemoglobin level is in the desired range, to limit the risk of potential side effects and to give you the best benefit. Keep all appointments for any recommended tests. Check your blood pressure as directed. Ask your doctor what your blood pressure should be and when you should contact him or her. As your body makes more red blood cells, you may need to take iron, folic acid, or vitamin B supplements. Ask your doctor or health care provider which products are right for you. If you have kidney disease continue dietary restrictions, even though this medication can make you feel better. Talk with your doctor or health care professional about the foods you eat and the vitamins that you take. What side effects may I notice from receiving this medicine? Side effects that you should report to your doctor or health care professional as soon as possible: -allergic reactions like skin rash, itching or hives, swelling of the face, lips, or tongue -breathing problems -changes in vision -chest pain -confusion, trouble speaking or understanding -feeling faint or lightheaded, falls -high blood pressure -muscle aches or  pains -pain, swelling, warmth in the leg -rapid weight gain -severe headaches -sudden numbness or weakness of the face, arm or leg -trouble walking, dizziness, loss of balance or coordination -seizures (convulsions) -swelling of the ankles, feet, hands -unusually weak or tired Side effects that usually do not require medical attention (report to your doctor or health care professional if they continue or are bothersome): -diarrhea -fever, chills (flu-like symptoms) -headaches -nausea, vomiting -redness, stinging, or swelling at site where injected This list may not describe all possible side effects. Call your doctor for medical advice about side effects. You may report side effects to FDA at 1-800-FDA-1088. Where should I keep my medicine? Keep out of the reach of children. Store in a refrigerator between 2 and 8 degrees C (36 and 46 degrees F). Do not freeze. Do not shake. Throw away any unused portion if using a single-dose vial. Throw away any unused medicine after the expiration date. NOTE: This sheet is a summary. It may not cover all possible information. If you have questions about this medicine, talk to your doctor, pharmacist, or health care provider.  2013, [employer]. (04/15/2008 10:23:57 AM)  Cyanocobalamin, Vitamin B12 injection What is this medicine? CYANOCOBALAMIN (sye an oh koe BAL a min) is a man made form of vitamin B12. Vitamin B12 is used in the growth of healthy blood cells, nerve cells, and proteins in the body. It also helps with the metabolism of fats and carbohydrates. This medicine is used to treat people who can not absorb vitamin B12. This medicine may be used for other purposes; ask your health care provider or pharmacist if you have questions. What should I tell my health care provider before I take this medicine? They need to know if you have any of these conditions: -kidney disease -Leber's disease -megaloblastic anemia -an unusual or  allergic reaction to cyanocobalamin, cobalt, other medicines, foods, dyes, or preservatives -pregnant or trying to get pregnant -breast-feeding How should I use this medicine? This medicine is injected into a muscle or deeply under the skin. It is usually given by a health care professional in a clinic or doctor's office. However, your doctor may teach you how to inject yourself. Follow all instructions. Talk to your pediatrician regarding the use of this medicine in children. Special care may be needed. Overdosage: If you think you have taken too much of this medicine contact a poison control center or emergency room at once. NOTE: This medicine is only for you. Do not share this medicine with others. What if I miss a dose? If you are given your dose at a clinic or doctor's office, call to reschedule your appointment. If you give your own injections and you miss a dose, take it as soon as you can. If it is almost time for your next dose, take only that dose. Do not take double or extra doses. What may interact with this medicine? -colchicine -heavy alcohol intake This list may not describe all possible interactions. Give your health care provider a list of all the medicines, herbs, non-prescription drugs, or dietary supplements you use. Also tell them if you smoke, drink alcohol, or use illegal drugs. Some items may interact with your medicine. What should I watch for while using this medicine? Visit your doctor or health care professional regularly. You may need blood work done while you are taking this medicine. You may need to follow a special diet. Talk to your doctor. Limit your alcohol intake and avoid smoking to get the best benefit. What side effects may I notice from receiving this medicine? Side effects that you should report to your doctor or health care professional as soon as possible: -allergic reactions like skin rash, itching or hives, swelling of the face, lips, or tongue -blue  tint to skin -chest tightness, pain -difficulty breathing, wheezing -dizziness -red, swollen painful area on the leg Side effects that usually do not require medical attention (report to your doctor or health care professional if they continue or are bothersome): -diarrhea -headache This list may not describe all possible side effects. Call your doctor for medical advice about side effects. You may report side effects to FDA at 1-800-FDA-1088. Where should I keep my medicine? Keep out of the reach of children. Store at room temperature between 15 and 30 degrees C (59 and 85 degrees F). Protect from light. Throw away any unused medicine after the expiration date. NOTE: This sheet is a summary. It may not cover all possible information. If you have questions about this medicine, talk to your doctor, pharmacist, or health care provider.  2013, [employer]. (08/13/2007 10:10:20 PM)

## 2012-07-08 ENCOUNTER — Other Ambulatory Visit: Payer: Self-pay | Admitting: Oncology

## 2012-07-09 ENCOUNTER — Other Ambulatory Visit: Payer: Self-pay | Admitting: Medical Oncology

## 2012-07-09 ENCOUNTER — Other Ambulatory Visit: Payer: Self-pay | Admitting: Oncology

## 2012-07-10 ENCOUNTER — Telehealth: Payer: Self-pay | Admitting: Medical Oncology

## 2012-07-10 ENCOUNTER — Other Ambulatory Visit: Payer: Self-pay | Admitting: Oncology

## 2012-07-10 ENCOUNTER — Other Ambulatory Visit: Payer: Self-pay | Admitting: Neurology

## 2012-07-10 DIAGNOSIS — R1310 Dysphagia, unspecified: Secondary | ICD-10-CM

## 2012-07-10 DIAGNOSIS — R490 Dysphonia: Secondary | ICD-10-CM

## 2012-07-10 NOTE — Telephone Encounter (Signed)
Per desk nurse moved 2/27 appts to 3/6 @ 1:30pm for lb/DM/inf. Per desk nurse she will contact pt's dtr.

## 2012-07-10 NOTE — Telephone Encounter (Signed)
I spoke with Christina Hopkins-daughter to let her know we are moving her mother's appointments for Thursday. We will be moving her lab, MD and chemo to March 6th.  Christina Hopkins voiced understanding.

## 2012-07-11 ENCOUNTER — Other Ambulatory Visit: Payer: Self-pay | Admitting: Otolaryngology

## 2012-07-11 ENCOUNTER — Encounter: Payer: Self-pay | Admitting: Oncology

## 2012-07-11 NOTE — Progress Notes (Signed)
We received an office note from Dr. Flo Shanks dated 07/09/2012.  In late December 2013, the patient had an episode of garbled speech and pronunciation. She went to the emergency room was felt to have a possible TIA. She apparently received IV fluids. Her speech difficulty apparently cleared and there were no other neurologic issues at that time.  A couple of days later she developed hoarseness which has been progressive. The patient may be having aspiration.  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  CT scan of the neck and chest has been ordered.  A swallowing study was carried out on 06/04/2012 and showed aspiration.

## 2012-07-12 ENCOUNTER — Ambulatory Visit: Payer: Medicare Other | Admitting: Oncology

## 2012-07-12 ENCOUNTER — Ambulatory Visit: Payer: Medicare Other

## 2012-07-12 ENCOUNTER — Ambulatory Visit
Admission: RE | Admit: 2012-07-12 | Discharge: 2012-07-12 | Disposition: A | Discharge status: Home / Self Care | Payer: Medicare Other | Source: Ambulatory Visit | Attending: Otolaryngology | Admitting: Otolaryngology

## 2012-07-12 ENCOUNTER — Other Ambulatory Visit: Payer: Medicare Other | Admitting: Lab

## 2012-07-12 MED ORDER — IOHEXOL 300 MG/ML  SOLN
75.0000 mL | Freq: Once | INTRAMUSCULAR | Status: AC | PRN
  Administered 2012-07-12: 75 mL via INTRAVENOUS

## 2012-07-15 ENCOUNTER — Ambulatory Visit
Admission: RE | Admit: 2012-07-15 | Discharge: 2012-07-15 | Disposition: A | Discharge status: Home / Self Care | Payer: Medicare Other | Source: Ambulatory Visit | Attending: Neurology | Admitting: Neurology

## 2012-07-15 DIAGNOSIS — R490 Dysphonia: Secondary | ICD-10-CM

## 2012-07-15 DIAGNOSIS — R1310 Dysphagia, unspecified: Secondary | ICD-10-CM

## 2012-07-19 ENCOUNTER — Ambulatory Visit (HOSPITAL_BASED_OUTPATIENT_CLINIC_OR_DEPARTMENT_OTHER): Payer: Medicare Other

## 2012-07-19 ENCOUNTER — Encounter: Payer: Self-pay | Admitting: Oncology

## 2012-07-19 ENCOUNTER — Other Ambulatory Visit: Payer: Self-pay | Admitting: Medical Oncology

## 2012-07-19 ENCOUNTER — Ambulatory Visit (HOSPITAL_BASED_OUTPATIENT_CLINIC_OR_DEPARTMENT_OTHER): Payer: Medicare Other | Admitting: Oncology

## 2012-07-19 ENCOUNTER — Other Ambulatory Visit (HOSPITAL_BASED_OUTPATIENT_CLINIC_OR_DEPARTMENT_OTHER): Payer: Medicare Other | Admitting: Lab

## 2012-07-19 VITALS — BP 101/56 | HR 58 | Temp 97.0°F | Resp 18 | Ht <= 58 in | Wt 109.9 lb

## 2012-07-19 DIAGNOSIS — C9000 Multiple myeloma not having achieved remission: Secondary | ICD-10-CM

## 2012-07-19 DIAGNOSIS — J3801 Paralysis of vocal cords and larynx, unilateral: Secondary | ICD-10-CM | POA: Insufficient documentation

## 2012-07-19 DIAGNOSIS — E538 Deficiency of other specified B group vitamins: Secondary | ICD-10-CM

## 2012-07-19 DIAGNOSIS — D519 Vitamin B12 deficiency anemia, unspecified: Secondary | ICD-10-CM

## 2012-07-19 LAB — CBC WITH DIFFERENTIAL/PLATELET
Basophils Absolute: 0 10*3/uL (ref 0.0–0.1)
EOS%: 4.9 % (ref 0.0–7.0)
HCT: 30 % — ABNORMAL LOW (ref 34.8–46.6)
HGB: 10.1 g/dL — ABNORMAL LOW (ref 11.6–15.9)
LYMPH%: 33.5 % (ref 14.0–49.7)
MCH: 34.4 pg — ABNORMAL HIGH (ref 25.1–34.0)
MCV: 102.5 fL — ABNORMAL HIGH (ref 79.5–101.0)
MONO%: 9 % (ref 0.0–14.0)
NEUT%: 51.6 % (ref 38.4–76.8)
Platelets: 133 10*3/uL — ABNORMAL LOW (ref 145–400)
lymph#: 1 10*3/uL (ref 0.9–3.3)

## 2012-07-19 LAB — COMPREHENSIVE METABOLIC PANEL (CC13)
AST: 20 U/L (ref 5–34)
Albumin: 3.2 g/dL — ABNORMAL LOW (ref 3.5–5.0)
BUN: 18.7 mg/dL (ref 7.0–26.0)
CO2: 27 mEq/L (ref 22–29)
Calcium: 9.4 mg/dL (ref 8.4–10.4)
Chloride: 98 mEq/L (ref 98–107)
Potassium: 4.5 mEq/L (ref 3.5–5.1)

## 2012-07-19 MED ORDER — ONDANSETRON HCL 8 MG PO TABS
8.0000 mg | ORAL_TABLET | Freq: Once | ORAL | Status: AC
  Administered 2012-07-19: 8 mg via ORAL

## 2012-07-19 MED ORDER — HYDROCODONE-ACETAMINOPHEN 10-325 MG PO TABS: 1.0000 | ORAL_TABLET | Freq: Four times a day (QID) | ORAL | Status: DC | PRN

## 2012-07-19 MED ORDER — BORTEZOMIB CHEMO SQ INJECTION 3.5 MG (2.5MG/ML)
1.3000 mg/m2 | Freq: Once | INTRAMUSCULAR | Status: AC
  Administered 2012-07-19: 2 mg via SUBCUTANEOUS
  Filled 2012-07-19: qty 2

## 2012-07-19 MED ORDER — PROCHLORPERAZINE MALEATE 10 MG PO TABS: 10.0000 mg | ORAL_TABLET | Freq: Four times a day (QID) | ORAL | Status: DC | PRN

## 2012-07-19 NOTE — Patient Instructions (Signed)
[facility] Discharge Instructions for Patients Receiving Chemotherapy  Today you received the following chemotherapy agents Velcade.  To help prevent nausea and vomiting after your treatment, we encourage you to take your nausea medication.   If you develop nausea and vomiting that is not controlled by your nausea medication, call the clinic. If it is after clinic hours your family physician or the after hours number for the clinic or go to the Emergency Department.   BELOW ARE SYMPTOMS THAT SHOULD BE REPORTED IMMEDIATELY:  *FEVER GREATER THAN 100.5 F  *CHILLS WITH OR WITHOUT FEVER  NAUSEA AND VOMITING THAT IS NOT CONTROLLED WITH YOUR NAUSEA MEDICATION  *UNUSUAL SHORTNESS OF BREATH  *UNUSUAL BRUISING OR BLEEDING  TENDERNESS IN MOUTH AND THROAT WITH OR WITHOUT PRESENCE OF ULCERS  *URINARY PROBLEMS  *BOWEL PROBLEMS  UNUSUAL RASH Items with * indicate a potential emergency and should be followed up as soon as possible.  One of the nurses will contact you 24 hours after your treatment. Please let the nurse know about any problems that you may have experienced. Feel free to call the clinic you have any questions or concerns. The clinic phone number is [phone].   I have been informed and understand all the instructions given to me. I know to contact the clinic, my physician, or go to the Emergency Department if any problems should occur. I do not have any questions at this time, but understand that I may call the clinic during office hours   should I have any questions or need assistance in obtaining follow up care.    __________________________________________  _____________  __________ Signature of Patient or Authorized Representative            Date                   Time    __________________________________________ Nurse's Signature    

## 2012-07-19 NOTE — Progress Notes (Signed)
This office note has been dictated.  #[id number]

## 2012-07-20 ENCOUNTER — Encounter: Payer: Self-pay | Admitting: *Deleted

## 2012-07-20 LAB — IGG, IGA, IGM: IgM, Serum: 7 mg/dL — ABNORMAL LOW (ref 52–322)

## 2012-07-20 NOTE — Progress Notes (Signed)
RECEIVED A FAX FROM [employer]ERNING A CONFIRMATION OF PRESCRIPTION SHIPMENT FOR REVLIMID ON 07/19/12.

## 2012-07-20 NOTE — Progress Notes (Signed)
CC:   Christina Hopkins, M.D. Christina Hopkins. Christina Hopkins, M.D. Christina Hopkins, M.D.  PROBLEM LIST:  1. Multiple myeloma, initially presenting as an IgA kappa monoclonal  gammopathy in February 2009. 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 This was felt to be an adverse prognostic  determinant. Bone marrow on 08/31/2007 showed 46% plasma cells. A  repeat bone marrow on 08/05/2009 showed 73% plasma cells.  Treatment with Revlimid was started in April 2011. Zometa was  started in October 2011 and 2 years of treatment were concluded  on 02/23/2012. We had previously started Aranesp for the  patient's anemia in May of 2010. Most recent metastatic bone  survey was on 08/05/2009. Other x-rays since then are available.  Maximum IgA level was 2080 on 08/11/2009.  Subcutaneous Velcade was added to the patient's treatment program on 06/14/2011 because of a rising IgA level 1080 on 06/02/2011. The patient is now receiving  Velcade every 2 weeks in combination with Revlimid being given 10  mg 1 week on 1 week off and Aranesp 300 mcg subcu every 4 weeks for hemoglobin less than or equal to 10.    2. Anemia secondary to vitamin B12 deficiency.  The patient had been on oral vitamin B12. However, her vitamin B12  level fell to 284 on 07/26/2011 and vitamin B12 shots 1000 mcg  given IM every month was resumed on 09/06/2011.  3. Anemia secondary to iron deficiency.  4. Vitamin D deficiency.  5. Magnesium deficiency.  6. Diabetes mellitus.  7. Hypertension.  8. Dyslipidemia.  9. Coronary artery disease.  10. Right anterior thigh mass, most likely a lipoma.  11. Multiple vertebral compression fractures as seen  on MRI of the lumbar spine on 08/27/2011. Kyphoplasty was carried out at L5, T11 and T12 on 10/07/2011 by Dr. Julieanne Hopkins.  12. Left vocal cord paralysis, complete, noted 05/06/2012.  MEDICATIONS:  Reviewed and recorded. Current Outpatient Prescriptions  Medication Sig Dispense Refill   . Acetaminophen (TYLENOL ARTHRITIS EXT RELIEF PO) Take by mouth.      Marland Kitchen acyclovir (ZOVIRAX) 400 MG tablet Take 1 tablet (400 mg total) by mouth 2 (two) times daily.  60 tablet  3  . Alum & Mag Hydroxide-Simeth (MAGIC MOUTHWASH) SOLN Take by mouth as needed.      Marland Kitchen aspirin 81 MG tablet Take 81 mg by mouth daily.        Marland Kitchen atorvastatin (LIPITOR) 40 MG tablet Take 40 mg by mouth daily.        . carvedilol (COREG) 25 MG tablet Take 25 mg by mouth 2 (two) times daily with a meal.      . cholecalciferol (VITAMIN D) 1000 UNITS tablet Take 2,000-5,000 Units by mouth 2 (two) times daily. 5000 units at AM and 2000 units at night      . cyclobenzaprine (FLEXERIL) 10 MG tablet Take 10 mg by mouth 3 (three) times daily as needed. For muscle spasm      . lenalidomide (REVLIMID) 10 MG capsule Take 1 capsule (10 mg total) by mouth See admin instructions. Take 1 po daily x 7 days on, then 7 days off.  14 capsule  0  . lisinopril (PRINIVIL,ZESTRIL) 2.5 MG tablet Take 1 tablet (2.5 mg total) by mouth daily.  30 tablet  0  . Magnesium 400 MG CAPS Take 1 capsule by mouth 4 (four) times daily.  120 capsule  4  . metFORMIN (GLUCOPHAGE) 500 MG tablet Take 500 mg by mouth 2 (  two) times daily with a meal.        . mirtazapine (REMERON) 15 MG tablet Take 15 mg by mouth at bedtime.        . Multiple Vitamins-Minerals (CENTRUM SILVER PO) Take 1 tablet by mouth daily.       . pantoprazole (PROTONIX) 40 MG tablet Take 40 mg by mouth daily.        Christina Hopkins (SYSTANE OP) Place 2 drops into both eyes daily.      Marland Kitchen spironolactone (ALDACTONE) 25 MG tablet Take 25 mg by mouth daily.      Marland Kitchen HYDROcodone-acetaminophen (NORCO) 10-325 MG per tablet Take 1 tablet by mouth every 6 (six) hours as needed.  30 tablet  3  . prochlorperazine (COMPAZINE) 10 MG tablet Take 1 tablet (10 mg total) by mouth every 6 (six) hours as needed. For nausea  30 tablet  3   No current facility-administered medications for this visit.    Facility-Administered Medications Ordered in Other Visits  Medication Dose Route Frequency Provider Last Rate Last Dose  . darbepoetin (ARANESP) injection 300 mcg  300 mcg Subcutaneous Once Samul Dada, MD        TREATMENT PROGRAM: 1. Revlimid 10 mg daily, 1 week on/1 week off. 2. Velcade 2 mg subcutaneous, now being given every 2 weeks.  Velcade     was started on 06/14/2011. 3. Aranesp 300 mcg subcu every 2 weeks for hemoglobin less than or     equal to 10. 4. Vitamin B12 1000 mcg IM monthly, restarted on 09/06/2011. 5. Zometa 3.5 mg IV was administered from 02/14/2010 through     02/23/2012.   IMMUNIZATIONS:  1. Pneumovax was apparently given about 9 years ago when the patient  was 79 years old.  2. Flu shot was given on 01/27/2012.    SMOKING HISTORY: The patient has never smoked cigarettes.    HISTORY:  Christina Hopkins was seen today for followup of her IgA kappa multiple myeloma.  Christina Hopkins was accompanied by her daughter, Christina Dandy.  She was last seen by [place] on 06/14/2012 and prior to that on 04/19/2012.  Unfortunately Christina Hopkins has had some significant problems that developed in late December.  The patient apparently developed an episode of garbled speech and difficulty with pronunciation in late December 2013. She went to the emergency room and was felt to have a possible TIA.  She received IV fluids.  Her speech cleared and there were no other neurologic issues at that time.  Apparently 2 days later, the patient developed rather suddenly some hoarseness.  This may have been progressive.  There were also concerns the patient was having some aspiration.  The patient has been evaluated by Dr. Lesia Hopkins from neurology and Dr. Flo Hopkins from ENT.  The patient has had an extensive workup.  I believe the conclusion is that she has not had a stroke.  The etiology of her left vocal cord paralysis is unclear. Apparently she will be having an injection into the  left vocal cord in attempt to bring it into the midline by Dr. Lazarus Hopkins later this month.  With regard to the patient's multiple myeloma, we have been concerned that her IgA level has been increasing over the past 4-6 months.  At one time the IgA level was in the 200 range.  On 02/23/2012 IgA level was 449 and on 04/19/2012 the IgA level was 704.  Beginning in mid January the patient has been receiving  Velcade 2 mg subcutaneous every 2 weeks. She receives vitamin B12 monthly.  She has being getting Aranesp 300 mcg subcu every couple weeks whenever the hemoglobin is less than or equal to 10.  The patient may be having some aspiration.  She sometimes coughs when she eats.  She does not appear to be choking.  As stated, the patient has undergone extensive imaging studies, which will be described below.  The patient has had some back pain recently.  No other major difficulties.  PHYSICAL EXAMINATION:  The patient is elderly and frail.  She is 79 years old.  She is in no acute distress.  Her voice is barely a whisper. She has a marked dorsal kyphosis and shortening of her height.  Weight is 109 pounds 14.4 ounces.  Height 4 feet 9 inches.  Body surface area 1.42 sq m.  Blood pressure 101/56.  Other vital signs are normal.  There is no scleral icterus.  Mouth and pharynx are benign although the patient protrudes her tongue.  This is suggestive of an extrapyramidal reaction.  The patient is on Remeron and has been on this for many, many years for appetite stimulation.  I have suggested we stop this.  The patient's voice is like a whisper.  She has no adenopathy.  There were some inspiratory rales at the bases.  Cardiac exam regular rhythm without murmur or rub.  The patient does not have a Port-A-Cath or central catheter.  Abdomen with the patient sitting is benign. Extremities:  No peripheral edema or clubbing.  The patient has a rolling walker.  In the past we have noted a lipoma involving  the right lower proximal thigh.  Neurologic exam was not carried out in detail, seems unchanged.  LABORATORY DATA:  White count 2.9, ANC 1.5, hemoglobin 10.1, hematocrit 30.0, platelets 133,000.  It should be noted that the patient took her Revlimid until Monday, March 3.  She has been off of this now for a few days.  She will be restarting this on Tuesday, March 11.  She is on 1 week on/1 week off.  Chemistries today notable only for a sodium of 134, glucose 134 and albumin 3.2.  Total protein is 7.2 and thus globulins are 4.0.  LDH 137.  Quantitative immunoglobulins are pending.  IgA level on 06/14/2012 was 806 as compared with 704 on 04/19/2012 and 449 on 02/23/2012.  Vitamin D level on 07/04/2012 was 52.  Vitamin B12 level on 04/19/2012 was 531.   IMAGING STUDIES:  1. Most recent metastatic bone survey was on 08/05/2009. Prior to  that, we had a metastatic bone survey from 11/03/2008 and  07/23/2007. The metastatic bone survey from 08/05/2009 stated that  there were no findings to strongly suggest metastatic disease.  There were multiple sites of degenerative change.  2. Chest x-ray from 02/09/2010 showed new or increased T6 and T7  compression fractures compared with the skeletal survey of  08/05/2009.  3. CT scan of head without IV contrast showed no acute findings. The  calvarium was intact. There was a probable old lacunar infarct in  the right basal ganglia.  4. There are x-rays of the left hip and lumbar spine from 07/16/2010.  There are x-rays of the left humerus and cervical spine from  06/30/2010.  5. An ultrasound of the right lower extremity was carried out on 08/18/2010  and did not show a discrete mass in the right thigh.  6. Screening mammogram from 02/01/2011 carried out at [facility] was negative.  7. MRI of the lumbar spine with and without IV contrast on 08/27/2011  showed acute/subacute superior endplate fracture of L5 with minimal  loss of  vertebral body height and no significant retropulsion.  There was a nonhealed superior endplate compression fracture at T11  and T12 with associated kyphosis. There was central canal stenosis  greatest at L4-5, foraminal stenosis greatest at L2-3 and L3-4, and  nonhealed sacral insufficiency fractures. There was multilevel  spondylosis of the lumbar spine.  8. Kyphoplasty at L5, T11 and T12 carried out on 10/07/2011 by Dr.  Julieanne Hopkins was carried out successfully. Biopsies showed  blood clot and hypercellular marrow with increased plasma cell  infiltrates at L5, T11 and T12. 9. Chest x-ray, 2 view, from 05/04/2012 showed enlargement of the     cardiac silhouette and evidence of CABG.  There were chronic     bronchitic changes with left basilar scarring.  Osteoporosis with     multiple thoracic spine compression fractures was present.  There     were no acute abnormalities. 10. X-rays of thoracic spine, 2 view, from 05/04/2012 showed evidence     of mid T3 and L1 compression fractures which are new since 2011 and     April 2013 respectively.  There was interval augmentation of T11     and T12 fractures as well as chronic T6 and T7 compression     fractures. 11. CT of the head without IV contrast on 05/04/2012 showed no acute     intracranial abnormalities and no significant changes compared with     the head CT from 07/16/2010. 12. MRI of the thoracic spine without IV contrast on 05/05/2012     confirmed the acute/subacute inferior endplate compression fracture     of C3 with approximately 25% loss of height.  The remote inferior     endplate fractures at T6 and T7 demonstrate some residual edema     suggesting these are incompletely healed or have continued to     fracture.  There was a superior endplate compression fracture at     T10 with minimal loss of height.  There was evidence of vertebral     augmentation at T11 and T12 without residual edema.  There was mild      spondylosis of the thoracic spine and small bilateral pleural     effusions with associated atelectasis. 13. Swallowing evaluation on 06/04/2012 showed severe oral phase     dysphagia, severe pharyngeal phase dysphagia and moderate cervical     esophageal phase dysphagia.  Aspiration of secretions and liquids     were noted. 14. CT scan of the neck with IV contrast on 07/12/2012 showed lateral     deviation of the left vocal cord compatible with paralysis.  There     was no focal lesion along the course of the recurrent laryngeal     nerve to explain the finding.  There was no primary mucosal     neoplasm or evidence of metastatic disease.  There was extensive     atherosclerotic change without significant stenosis.  There was     moderate spondylosis of the cervical spine.  There was an 8 mm     obstructing stone in the proximal right submandibular duct with     chronic atrophy of the right submandibular gland. 15. An MRI of the brain without IV contrast was carried out at     [facility].  We will try to obtain a copy of that report.   IMPRESSION AND PLAN:  Unfortunately Ms. Olmsted has developed left vocal cord paralysis.  Her voice is quite weak and apparently she is having some aspiration.  Plans to inject her left vocal cord by Dr. Flo Hopkins will be carried out within the next couple weeks.  I do not see where the patient has had a chest CT scan, and I will go ahead and order that with IV contrast to rule out a mediastinal lesion to account for the left vocal cord paralysis.  We are concerned about progression of the patient's myeloma with a rising IgA level over the past few months.  For that reason the interval between Velcade treatments was decreased from every 4 weeks to every 2 weeks beginning in mid January.  The patient is due for subcutaneous Velcade today, 2 mg.  She will resume her Revlimid on or about 07/24/2012.  She takes Revlimid 10 mg daily 1 week on/1  week off.  The patient is not due for vitamin B12 today as she received it 2 weeks ago. In addition, her hemoglobin today is 10.1 and thus she does not require any Aranesp today.  She did receive Aranesp 300 mcg subcu on 02/19.  The patient also may have some extrapyramidal affects from her Remeron. Her tongue seems to be protruding more than I recall in the past.  In addition, there are some abnormal mouth movements.  I have instructed the patient and Christina Dandy to stop the Remeron.  As noted above we will go ahead with CT scan of the chest with IV contrast to rule out a mediastinal lesion to account for the left vocal cord paralysis.  We will have the patient return in 2 weeks which will be March 20, at which time she will have a CBC and another dose of Velcade 2 mg subcutaneous.  We will plan to see the patient again in approximately 4 weeks around April 3, at which time we will check CBC, chemistries, IgA level and serum light chains.  If it appears that the IgA level is still increasing we may consider decreasing the interval between Velcade treatments to every week, adding Decadron or perhaps changing to alternative treatment which could include pomalidomide or carfilzomib.  Today's session was extended approximately an hour due to the review of extensive records relating to new imaging studies and the patient's new diagnosis of a left vocal cord paralysis.    ______________________________ Samul Dada, M.D. DSM/MEDQ  D:  07/19/2012  T:  07/20/2012  Job:  [id number]

## 2012-07-25 ENCOUNTER — Ambulatory Visit (HOSPITAL_COMMUNITY)
Admission: RE | Admit: 2012-07-25 | Discharge: 2012-07-25 | Disposition: A | Discharge status: Home / Self Care | Payer: Medicare Other | Source: Ambulatory Visit | Attending: Oncology | Admitting: Oncology

## 2012-07-25 DIAGNOSIS — J3801 Paralysis of vocal cords and larynx, unilateral: Secondary | ICD-10-CM | POA: Insufficient documentation

## 2012-07-25 DIAGNOSIS — Z951 Presence of aortocoronary bypass graft: Secondary | ICD-10-CM | POA: Insufficient documentation

## 2012-07-25 DIAGNOSIS — C9000 Multiple myeloma not having achieved remission: Secondary | ICD-10-CM | POA: Insufficient documentation

## 2012-07-25 DIAGNOSIS — M439 Deforming dorsopathy, unspecified: Secondary | ICD-10-CM | POA: Insufficient documentation

## 2012-07-25 DIAGNOSIS — Z9221 Personal history of antineoplastic chemotherapy: Secondary | ICD-10-CM | POA: Insufficient documentation

## 2012-07-25 DIAGNOSIS — K449 Diaphragmatic hernia without obstruction or gangrene: Secondary | ICD-10-CM | POA: Insufficient documentation

## 2012-07-25 DIAGNOSIS — R059 Cough, unspecified: Secondary | ICD-10-CM | POA: Insufficient documentation

## 2012-07-25 MED ORDER — IOHEXOL 300 MG/ML  SOLN
80.0000 mL | Freq: Once | INTRAMUSCULAR | Status: AC | PRN
  Administered 2012-07-25: 80 mL via INTRAVENOUS

## 2012-07-25 NOTE — Progress Notes (Signed)
Quick Note:  Please notify patient and call/fax these results to patient's doctors. ______ 

## 2012-07-26 ENCOUNTER — Telehealth: Payer: Self-pay | Admitting: Oncology

## 2012-07-26 NOTE — Telephone Encounter (Signed)
Gave pt appt for lab and chemo on 08/02/12

## 2012-08-01 ENCOUNTER — Telehealth: Payer: Self-pay

## 2012-08-01 NOTE — Telephone Encounter (Signed)
Christina Hopkins called asking for results of CT last week. Results given. Pt still with cough and pain to chest. Pt saw Dr Tiburcio Pea last week. Discussed continued observation of cough watching for increased secretions, fever, color sputum etc. Call us if any questions/ or Dr Tiburcio Pea

## 2012-08-02 ENCOUNTER — Other Ambulatory Visit (HOSPITAL_BASED_OUTPATIENT_CLINIC_OR_DEPARTMENT_OTHER): Payer: Medicare Other | Admitting: Lab

## 2012-08-02 ENCOUNTER — Other Ambulatory Visit: Payer: Self-pay | Admitting: Oncology

## 2012-08-02 ENCOUNTER — Ambulatory Visit (HOSPITAL_BASED_OUTPATIENT_CLINIC_OR_DEPARTMENT_OTHER): Payer: Medicare Other

## 2012-08-02 VITALS — BP 100/52 | HR 73 | Temp 98.0°F | Resp 18

## 2012-08-02 DIAGNOSIS — D518 Other vitamin B12 deficiency anemias: Secondary | ICD-10-CM

## 2012-08-02 DIAGNOSIS — C9000 Multiple myeloma not having achieved remission: Secondary | ICD-10-CM

## 2012-08-02 DIAGNOSIS — Z5112 Encounter for antineoplastic immunotherapy: Secondary | ICD-10-CM

## 2012-08-02 LAB — CBC WITH DIFFERENTIAL/PLATELET
BASO%: 0.8 % (ref 0.0–2.0)
Basophils Absolute: 0 10*3/uL (ref 0.0–0.1)
EOS%: 2 % (ref 0.0–7.0)
HGB: 10.2 g/dL — ABNORMAL LOW (ref 11.6–15.9)
MCH: 32.9 pg (ref 25.1–34.0)
MCHC: 32.5 g/dL (ref 31.5–36.0)
RDW: 16.2 % — ABNORMAL HIGH (ref 11.2–14.5)
lymph#: 1.3 10*3/uL (ref 0.9–3.3)

## 2012-08-02 MED ORDER — CYANOCOBALAMIN 1000 MCG/ML IJ SOLN
1000.0000 ug | Freq: Once | INTRAMUSCULAR | Status: AC
  Administered 2012-08-02: 1000 ug via INTRAMUSCULAR

## 2012-08-02 MED ORDER — ONDANSETRON HCL 8 MG PO TABS
8.0000 mg | ORAL_TABLET | Freq: Once | ORAL | Status: AC
  Administered 2012-08-02: 8 mg via ORAL

## 2012-08-02 MED ORDER — SODIUM CHLORIDE 0.9 % IV SOLN
Freq: Once | INTRAVENOUS | Status: AC
  Administered 2012-08-02: 15:00:00 via INTRAVENOUS

## 2012-08-02 MED ORDER — BORTEZOMIB CHEMO SQ INJECTION 3.5 MG (2.5MG/ML)
1.3000 mg/m2 | Freq: Once | INTRAMUSCULAR | Status: AC
  Administered 2012-08-02: 2 mg via SUBCUTANEOUS
  Filled 2012-08-02: qty 2

## 2012-08-02 NOTE — Patient Instructions (Signed)
[facility] Discharge Instructions for Patients Receiving Chemotherapy  Today you received the following chemotherapy agents Velcade.  To help prevent nausea and vomiting after your treatment, we encourage you to take your nausea medication as prescribed.   If you develop nausea and vomiting that is not controlled by your nausea medication, call the clinic. If it is after clinic hours your family physician or the after hours number for the clinic or go to the Emergency Department.   BELOW ARE SYMPTOMS THAT SHOULD BE REPORTED IMMEDIATELY:  *FEVER GREATER THAN 100.5 F  *CHILLS WITH OR WITHOUT FEVER  NAUSEA AND VOMITING THAT IS NOT CONTROLLED WITH YOUR NAUSEA MEDICATION  *UNUSUAL SHORTNESS OF BREATH  *UNUSUAL BRUISING OR BLEEDING  TENDERNESS IN MOUTH AND THROAT WITH OR WITHOUT PRESENCE OF ULCERS  *URINARY PROBLEMS  *BOWEL PROBLEMS  UNUSUAL RASH Items with * indicate a potential emergency and should be followed up as soon as possible.  One of the nurses will contact you 24 hours after your treatment. Please let the nurse know about any problems that you may have experienced. Feel free to call the clinic you have any questions or concerns. The clinic phone number is [phone].   I have been informed and understand all the instructions given to me. I know to contact the clinic, my physician, or go to the Emergency Department if any problems should occur. I do not have any questions at this time, but understand that I may call the clinic during office hours   should I have any questions or need assistance in obtaining follow up care.    __________________________________________  _____________  __________ Signature of Patient or Authorized Representative            Date                   Time    __________________________________________ Nurse's Signature    

## 2012-08-02 NOTE — Progress Notes (Signed)
Patient blood pressure is low. Patient states she feels a little tired. Dr. Arline Asp notified. Order given and carried out for 300 mL normal saline, recheck blood pressure if still low nursing may infuse the remainder of the bag.   1500 Patient's blood pressure rechecked blood pressure is 91/50 and pulse is 70. Remainder of 500 mL bag infusing.  1530 Patient's blood pressure increased patient states she feels better. Dr. Arline Asp notified ok to let patient go home.

## 2012-08-08 ENCOUNTER — Encounter (HOSPITAL_BASED_OUTPATIENT_CLINIC_OR_DEPARTMENT_OTHER): Payer: Self-pay | Admitting: *Deleted

## 2012-08-08 ENCOUNTER — Telehealth: Payer: Self-pay | Admitting: *Deleted

## 2012-08-08 NOTE — Progress Notes (Signed)
Pt lives with daughter who is RN-will bring pt and meds-has had labs 07/19/12-ok with dr crews-and cbc 08/02/12-ekg 12/13-sees cardiology [place]-dr rhinhart yearly-will call for notes

## 2012-08-08 NOTE — Telephone Encounter (Signed)
sw daughter gv appts for 08/17/12

## 2012-08-09 ENCOUNTER — Telehealth: Payer: Self-pay | Admitting: Oncology

## 2012-08-09 ENCOUNTER — Telehealth: Payer: Self-pay | Admitting: *Deleted

## 2012-08-09 NOTE — Telephone Encounter (Signed)
Per staff phone call and POF I have schedueld appts.  [name]  

## 2012-08-10 ENCOUNTER — Other Ambulatory Visit: Payer: Self-pay | Admitting: Otolaryngology

## 2012-08-10 NOTE — H&P (Signed)
Christina Hopkins,  Christina Hopkins 79 y.o., female [id number]     Chief Complaint: hoarseness  HPI: 79-year-old white female comes in for evaluation of dysphonia.  In late December, she had an episode where she had garbled speech and pronunciation and was felt to have had a possible TIA.  She was rehydrated in the emergency room.  Speech difficulty cleared quickly.  No other neurologic issues at that time.  2 days later, she developed hoarseness which has been progressive.  No trauma to the neck or throat.  No prior similar issues.  She had coronary bypass surgery 10+ years ago with no postoperative hoarseness.  No history of thyroid surgery.  At present, no diplopia, dysarthria, or obvious dysphagia.  She does describe what sounds like some aspiration.  The modified barium swallow did show some swallowing issues and recommended some modifications of her swallowing techniques.  Thus far, nothing to suggest aspiration pneumonia or complications.   She does have multiple myeloma and has been on some unnamed chemotherapies.  1 month return visit.  No change in symptoms.  The voice may be very slightly stronger on occasion.  She is breathing okay.  Still some suggestion of aspiration with swallowing.   She is on deck for Radiesse vocal cord augmentation for a paralyzed LEFT vocal cord.  I discussed the surgery in detail including risks and complications.  I specifically mentioned possible dental trauma related to her full upper and lower dentition and significant kyphosis. Allergies   PMH: Past Medical History  Diagnosis Date  . Hypertension   . Diabetes mellitus   . Anemia   . Osteoporosis   . Dyslipidemia   . Myocardial infarction   . colon ca dx'd 2003    surg only  . Multiple myeloma(203.0) dx'd 2009    oral chemo ongoing  . Coronary artery disease   . Shortness of breath   . GERD (gastroesophageal reflux disease)   . Wears glasses     Surg Hx: Past Surgical History  Procedure Laterality Date  .  Coronary artery bypass graft  08/2001    4 vessel  . Colon surgery  2003  . Tonsillectomy    . Appendectomy    . Abdominal hysterectomy    . Back surgery    . Eye surgery      cataracts    FHx:   Family History  Problem Relation Age of Onset  . Heart disease Brother   . Diabetes Brother    SocHx:  reports that she has never smoked. She does not have any smokeless tobacco history on file. She reports that she does not drink alcohol or use illicit drugs.  ALLERGIES:  Allergies  Allergen Reactions  . Gemfibrozil Nausea And Vomiting  . Nifedipine Hives  . Questran (Cholestyramine) Other (See Comments)    Do not remember     (Not in a hospital admission)  No results found for this or any previous visit (from the past 48 hour(s)). No results found.  ROS:Systemic: Feeling tired (fatigue).  No fever, no night sweats, and no recent weight loss. Head: Headache. Eyes: No eye symptoms. Otolaryngeal: Hearing loss.  No earache, no tinnitus, and no purulent nasal discharge.  No nasal passage blockage (stuffiness)  and no snoring.  Sneezing.  No hoarseness  and no sore throat. Cardiovascular: No chest pain or discomfort  and no palpitations. Pulmonary: No dyspnea.  Cough.  No wheezing. Gastrointestinal: Dysphagia, heartburn, and nausea.  No abdominal pain  and no melena.    No diarrhea. Genitourinary: No dysuria. Endocrine: Muscle weakness. Musculoskeletal: Calf muscle cramps  and arthralgias.  No soft tissue swelling. Neurological: Dizziness.  No fainting.  Tingling  and numbness. Psychological: No anxiety  and no depression. Skin: No rash. 12 system ROS was obtained and reviewed on the Health Maintenance form dated today.  Positive responses are shown above.  If the symptom is not checked, the patient has denied it.  BP:91/47,  HR: 69 b/min,  Height: 54 in, Weight: 110 lb,   PHYSICAL EXAM: She is short and kyphotic.  Mental status is intact.  Voice is mostly aphonic but  occasionally diplophonic.  Cough is weak.  Airway is good.  She has full upper and lower teeth in fair to good repair.  LEFT vocal cord remains paralyzed, bowed, and flaccid.  Neck without masses.   Lungs: Clear to auscultation Heart: Regular rate and rhythm without murmur Abdomen: Soft, active Extremities: Consistent with age Neurologic: Symmetric and intact.      Assessment/Plan  Vocal cord paralysis (478.30) (J38.00).  Myeloma (203.00) (C90.00).  Diabetes (250.00) (E11.9).  We are planning her surgery for next Monday.  Stop your aspirin today.  You can resume Tuesday after surgery.  Use the Lortab liquid either for pain relief or for cough suppression.  Recheck here 3 weeks after surgery.  Hydrocodone-Acetaminophen 5-217 MG/10ML Oral Solution;10-20 ml po q4h prn pain; Qty200; R1; Rx.  Napolean Sia 08/10/2012, 5:35 PM     

## 2012-08-13 ENCOUNTER — Encounter (HOSPITAL_BASED_OUTPATIENT_CLINIC_OR_DEPARTMENT_OTHER)
Admission: RE | Disposition: A | Discharge status: Home / Self Care | Payer: Self-pay | Source: Ambulatory Visit | Attending: Otolaryngology

## 2012-08-13 ENCOUNTER — Encounter (HOSPITAL_BASED_OUTPATIENT_CLINIC_OR_DEPARTMENT_OTHER): Payer: Self-pay | Admitting: *Deleted

## 2012-08-13 ENCOUNTER — Ambulatory Visit (HOSPITAL_BASED_OUTPATIENT_CLINIC_OR_DEPARTMENT_OTHER): Payer: Medicare Other | Admitting: Anesthesiology

## 2012-08-13 ENCOUNTER — Encounter (HOSPITAL_BASED_OUTPATIENT_CLINIC_OR_DEPARTMENT_OTHER): Payer: Self-pay | Admitting: Anesthesiology

## 2012-08-13 ENCOUNTER — Ambulatory Visit (HOSPITAL_BASED_OUTPATIENT_CLINIC_OR_DEPARTMENT_OTHER)
Admission: RE | Admit: 2012-08-13 | Discharge: 2012-08-13 | Disposition: A | Discharge status: Home / Self Care | Payer: Medicare Other | Source: Ambulatory Visit | Attending: Otolaryngology | Admitting: Otolaryngology

## 2012-08-13 DIAGNOSIS — I10 Essential (primary) hypertension: Secondary | ICD-10-CM | POA: Insufficient documentation

## 2012-08-13 DIAGNOSIS — K219 Gastro-esophageal reflux disease without esophagitis: Secondary | ICD-10-CM | POA: Insufficient documentation

## 2012-08-13 DIAGNOSIS — J3801 Paralysis of vocal cords and larynx, unilateral: Secondary | ICD-10-CM | POA: Insufficient documentation

## 2012-08-13 DIAGNOSIS — M810 Age-related osteoporosis without current pathological fracture: Secondary | ICD-10-CM | POA: Insufficient documentation

## 2012-08-13 DIAGNOSIS — Z85038 Personal history of other malignant neoplasm of large intestine: Secondary | ICD-10-CM | POA: Insufficient documentation

## 2012-08-13 DIAGNOSIS — R490 Dysphonia: Secondary | ICD-10-CM | POA: Insufficient documentation

## 2012-08-13 DIAGNOSIS — I2510 Atherosclerotic heart disease of native coronary artery without angina pectoris: Secondary | ICD-10-CM | POA: Insufficient documentation

## 2012-08-13 DIAGNOSIS — Z888 Allergy status to other drugs, medicaments and biological substances status: Secondary | ICD-10-CM | POA: Insufficient documentation

## 2012-08-13 DIAGNOSIS — M4000 Postural kyphosis, site unspecified: Secondary | ICD-10-CM | POA: Insufficient documentation

## 2012-08-13 DIAGNOSIS — Z79899 Other long term (current) drug therapy: Secondary | ICD-10-CM | POA: Insufficient documentation

## 2012-08-13 DIAGNOSIS — I252 Old myocardial infarction: Secondary | ICD-10-CM | POA: Insufficient documentation

## 2012-08-13 DIAGNOSIS — C9000 Multiple myeloma not having achieved remission: Secondary | ICD-10-CM | POA: Insufficient documentation

## 2012-08-13 DIAGNOSIS — E785 Hyperlipidemia, unspecified: Secondary | ICD-10-CM | POA: Insufficient documentation

## 2012-08-13 DIAGNOSIS — E119 Type 2 diabetes mellitus without complications: Secondary | ICD-10-CM | POA: Insufficient documentation

## 2012-08-13 DIAGNOSIS — D649 Anemia, unspecified: Secondary | ICD-10-CM | POA: Insufficient documentation

## 2012-08-13 HISTORY — DX: Shortness of breath: R06.02

## 2012-08-13 HISTORY — DX: Gastro-esophageal reflux disease without esophagitis: K21.9

## 2012-08-13 HISTORY — DX: Atherosclerotic heart disease of native coronary artery without angina pectoris: I25.10

## 2012-08-13 HISTORY — PX: DIRECT LARYNGOSCOPY WITH RADIAESSE INJECTION: SHX5328

## 2012-08-13 HISTORY — DX: Presence of spectacles and contact lenses: Z97.3

## 2012-08-13 LAB — POCT HEMOGLOBIN-HEMACUE: Hemoglobin: 10.7 g/dL — ABNORMAL LOW (ref 12.0–15.0)

## 2012-08-13 SURGERY — LARYNGOSCOPY, DIRECT, WITH CALCIUM HYDROXYLAPATITE MICROSPHERE INJECTION
Anesthesia: General | Site: Throat | Laterality: Left | Wound class: Clean Contaminated

## 2012-08-13 MED ORDER — DEXAMETHASONE SODIUM PHOSPHATE 10 MG/ML IJ SOLN
6.0000 mg | Freq: Once | INTRAMUSCULAR | Status: AC
  Administered 2012-08-13: 6 mg via INTRAVENOUS

## 2012-08-13 MED ORDER — EPINEPHRINE HCL 1 MG/ML IJ SOLN
INTRAMUSCULAR | Status: DC | PRN
  Administered 2012-08-13: 1 mg

## 2012-08-13 MED ORDER — MIDAZOLAM HCL 2 MG/2ML IJ SOLN: 1.0000 mg | INTRAMUSCULAR | Status: DC | PRN

## 2012-08-13 MED ORDER — LIDOCAINE HCL (CARDIAC) 20 MG/ML IV SOLN
INTRAVENOUS | Status: DC | PRN
  Administered 2012-08-13: 40 mg via INTRAVENOUS

## 2012-08-13 MED ORDER — LACTATED RINGERS IV SOLN
INTRAVENOUS | Status: DC
  Administered 2012-08-13: 07:00:00 via INTRAVENOUS

## 2012-08-13 MED ORDER — PROPOFOL 10 MG/ML IV BOLUS
INTRAVENOUS | Status: DC | PRN
  Administered 2012-08-13: 60 mg via INTRAVENOUS

## 2012-08-13 MED ORDER — FENTANYL CITRATE 0.05 MG/ML IJ SOLN
50.0000 ug | INTRAMUSCULAR | Status: DC | PRN
  Administered 2012-08-13: 25 ug via INTRAVENOUS

## 2012-08-13 MED ORDER — SUCCINYLCHOLINE CHLORIDE 20 MG/ML IJ SOLN
INTRAMUSCULAR | Status: DC | PRN
  Administered 2012-08-13: 40 mg via INTRAVENOUS

## 2012-08-13 MED ORDER — ONDANSETRON HCL 4 MG/2ML IJ SOLN
INTRAMUSCULAR | Status: DC | PRN
  Administered 2012-08-13: 4 mg via INTRAVENOUS

## 2012-08-13 SURGICAL SUPPLY — 20 items
CANISTER SUCTION 1200CC (MISCELLANEOUS) ×2 IMPLANT
CLOTH BEACON ORANGE TIMEOUT ST (SAFETY) ×2 IMPLANT
GAUZE SPONGE 4X4 12PLY STRL LF (GAUZE/BANDAGES/DRESSINGS) IMPLANT
GLOVE BIO SURGEON STRL SZ 6.5 (GLOVE) ×2 IMPLANT
GLOVE ECLIPSE 8.0 STRL XLNG CF (GLOVE) ×2 IMPLANT
GOWN PREVENTION PLUS XLARGE (GOWN DISPOSABLE) ×2 IMPLANT
GOWN PREVENTION PLUS XXLARGE (GOWN DISPOSABLE) ×2 IMPLANT
GUARD TEETH (MISCELLANEOUS) ×2 IMPLANT
KIT PROLARN PLUS GEL W/NDL (Prosthesis and Implant ENT) ×2 IMPLANT
MARKER SKIN DUAL TIP RULER LAB (MISCELLANEOUS) IMPLANT
NEEDLE SPNL 22GX7 QUINCKE BK (NEEDLE) IMPLANT
NEEDLE TRANS ORAL INJECTION (NEEDLE) ×2 IMPLANT
NS IRRIG 1000ML POUR BTL (IV SOLUTION) ×2 IMPLANT
PATTIES SURGICAL .5 X3 (DISPOSABLE) ×2 IMPLANT
SHEET MEDIUM DRAPE 40X70 STRL (DRAPES) ×2 IMPLANT
SLEEVE SCD COMPRESS KNEE MED (MISCELLANEOUS) ×2 IMPLANT
SOLUTION BUTLER CLEAR DIP (MISCELLANEOUS) ×2 IMPLANT
TOWEL OR 17X24 6PK STRL BLUE (TOWEL DISPOSABLE) ×2 IMPLANT
TOWEL OR NON WOVEN STRL DISP B (DISPOSABLE) ×2 IMPLANT
TUBE CONNECTING 20X1/4 (TUBING) ×2 IMPLANT

## 2012-08-13 NOTE — Op Note (Signed)
08/13/2012  8:29 AM    Christina Hopkins  [id number]   Pre-Op Dx:  Vocal cord paralysis, idiopathic  Post-op Dx: Same  Proc: Direct laryngoscopy with Radiesse vocal cord augmentation   Surg:  Flo Shanks T MD  Anes:  GOT  EBL:  None  Comp:  None  Findings:  Bowing of both vocal cords with the left vocal cord in a lateralized position.  Procedure: With the patient in a comfortable supine position, well supported given her kyphosis, general orotracheal anesthesia was induced without difficulty.  At an appropriate level, a rubber tooth guard was placed. The Hollinger laryngoscope was introduced. Access was quite easy. The scope was replaced with a laser anterior commissure laryngoscope for better working room.  The scope was suspended in the standard fashion. Topical epinephrine solution was applied against the vocal cord mucosa for several minutes for intraoperative hemostasis. Cotton pledgets were used and then removed.  Using the supplied long needle, Radiesse was used to augment the left vocal cord, and to a lesser degree the right vocal cord to correct for the bowing.  0. 7 mls total were used. Slight overcorrection, as planned, was noted. Once again epinephrine cotton pledgets were applied for several minutes then removed.    At this point the procedure was completed. The laryngoscope was then suspended and carefully removed. The dental guard was removed. The dental status was intact. Patient was returned anesthesia, awakened, extubated, and transferred to recovery in stable condition.  Dispo:   PACU to home  Plan:  Advancement of voice use, diet, and activity. Liquid hydrocodone for pain relief and also as a cough suppressant. Recheck my office 3 weeks. Given low anticipated risks of postanesthetic or postsurgical complications, feel an outpatient venue is appropriate.  Cephus Richer MD

## 2012-08-13 NOTE — Anesthesia Postprocedure Evaluation (Signed)
  Anesthesia Post-op Note  Patient: Christina Hopkins  Procedure(s) Performed: Procedure(s): LEFT VOCAL CORD RADIESSE AUGMENTATION LARYNGOSCOPY  (Left)  Patient Location: PACU  Anesthesia Type:General  Level of Consciousness: awake and alert   Airway and Oxygen Therapy: Patient Spontanous Breathing, Patient connected to face mask oxygen and aerosol face mask  Post-op Pain: mild  Post-op Assessment: Post-op Vital signs reviewed, Patient's Cardiovascular Status Stable, Respiratory Function Stable and Patent Airway  Post-op Vital Signs: Reviewed and stable  Complications: No apparent anesthesia complications

## 2012-08-13 NOTE — Transfer of Care (Signed)
Immediate Anesthesia Transfer of Care Note  Patient: Christina Hopkins  Procedure(s) Performed: Procedure(s): LEFT VOCAL CORD RADIESSE AUGMENTATION LARYNGOSCOPY  (Left)  Patient Location: PACU  Anesthesia Type:General  Level of Consciousness: awake, alert  and oriented  Airway & Oxygen Therapy: Patient Spontanous Breathing and Patient connected to face mask oxygen  Post-op Assessment: Report given to PACU RN and Post -op Vital signs reviewed and stable  Post vital signs: Reviewed and stable  Complications: No apparent anesthesia complications

## 2012-08-13 NOTE — Interval H&P Note (Signed)
History and Physical Interval Note:  08/13/2012 7:45 AM  Christina Hopkins  has presented today for surgery, with the diagnosis of LEFT VOCAL CORD PARALYSIS   The various methods of treatment have been discussed with the patient and family. After consideration of risks, benefits and other options for treatment, the patient has consented to  Procedure(s): LEFT VOCAL CORD RADIESSE AUGMENTATION LARYNGOSCOPY  (Left) as a surgical intervention .  The patient's history has been re-reviewed, patient re-examined, no change in status, stable for surgery.  I have re-reviewed the patient's chart and labs.  Questions were answered to the patient's satisfaction.     Flo Shanks

## 2012-08-13 NOTE — H&P (View-Only) (Signed)
Christina Hopkins, Christina Hopkins 79 y.o., female [id number]     Chief Complaint: hoarseness  HPI: 79 year old white female comes in for evaluation of dysphonia.  In late December, she had an episode where she had garbled speech and pronunciation and was felt to have had a possible TIA.  She was rehydrated in the emergency room.  Speech difficulty cleared quickly.  No other neurologic issues at that time.  2 days later, she developed hoarseness which has been progressive.  No trauma to the neck or throat.  No prior similar issues.  She had coronary bypass surgery 10+ years ago with no postoperative hoarseness.  No history of thyroid surgery.  At present, no diplopia, dysarthria, or obvious dysphagia.  She does describe what sounds like some aspiration.  The modified barium swallow did show some swallowing issues and recommended some modifications of her swallowing techniques.  Thus far, nothing to suggest aspiration pneumonia or complications.   She does have multiple myeloma and has been on some unnamed chemotherapies.  1 month return visit.  No change in symptoms.  The voice may be very slightly stronger on occasion.  She is breathing okay.  Still some suggestion of aspiration with swallowing.   She is on deck for Radiesse vocal cord augmentation for a paralyzed LEFT vocal cord.  I discussed the surgery in detail including risks and complications.  I specifically mentioned possible dental trauma related to her full upper and lower dentition and significant kyphosis. Allergies   PMH: Past Medical History  Diagnosis Date  . Hypertension   . Diabetes mellitus   . Anemia   . Osteoporosis   . Dyslipidemia   . Myocardial infarction   . colon ca dx'd 2003    surg only  . Multiple myeloma(203.0) dx'd 2009    oral chemo ongoing  . Coronary artery disease   . Shortness of breath   . GERD (gastroesophageal reflux disease)   . Wears glasses     Surg Hx: Past Surgical History  Procedure Laterality Date  .  Coronary artery bypass graft  08/2001    4 vessel  . Colon surgery  2003  . Tonsillectomy    . Appendectomy    . Abdominal hysterectomy    . Back surgery    . Eye surgery      cataracts    FHx:   Family History  Problem Relation Age of Onset  . Heart disease Brother   . Diabetes Brother    SocHx:  reports that she has never smoked. She does not have any smokeless tobacco history on file. She reports that she does not drink alcohol or use illicit drugs.  ALLERGIES:  Allergies  Allergen Reactions  . Gemfibrozil Nausea And Vomiting  . Nifedipine Hives  . Questran (Cholestyramine) Other (See Comments)    Do not remember     (Not in a hospital admission)  No results found for this or any previous visit (from the past 48 hour(s)). No results found.  WUJ:WJXBJYNW: Feeling tired (fatigue).  No fever, no night sweats, and no recent weight loss. Head: Headache. Eyes: No eye symptoms. Otolaryngeal: Hearing loss.  No earache, no tinnitus, and no purulent nasal discharge.  No nasal passage blockage (stuffiness)  and no snoring.  Sneezing.  No hoarseness  and no sore throat. Cardiovascular: No chest pain or discomfort  and no palpitations. Pulmonary: No dyspnea.  Cough.  No wheezing. Gastrointestinal: Dysphagia, heartburn, and nausea.  No abdominal pain  and no melena.  No diarrhea. Genitourinary: No dysuria. Endocrine: Muscle weakness. Musculoskeletal: Calf muscle cramps  and arthralgias.  No soft tissue swelling. Neurological: Dizziness.  No fainting.  Tingling  and numbness. Psychological: No anxiety  and no depression. Skin: No rash. 12 system ROS was obtained and reviewed on the Health Maintenance form dated today.  Positive responses are shown above.  If the symptom is not checked, the patient has denied it.  BP:91/47,  HR: 69 b/min,  Height: 54 in, Weight: 110 lb,   PHYSICAL EXAM: She is short and kyphotic.  Mental status is intact.  Voice is mostly aphonic but  occasionally diplophonic.  Cough is weak.  Airway is good.  She has full upper and lower teeth in fair to good repair.  LEFT vocal cord remains paralyzed, bowed, and flaccid.  Neck without masses.   Lungs: Clear to auscultation Heart: Regular rate and rhythm without murmur Abdomen: Soft, active Extremities: Consistent with age Neurologic: Symmetric and intact.      Assessment/Plan  Vocal cord paralysis (478.30) (J38.00).  Myeloma (203.00) (C90.00).  Diabetes (250.00) (E11.9).  We are planning her surgery for next Monday.  Stop your aspirin today.  You can resume Tuesday after surgery.  Use the Lortab liquid either for pain relief or for cough suppression.  Recheck here 3 weeks after surgery.  Hydrocodone-Acetaminophen 5-217 MG/10ML Oral Solution;10-20 ml po q4h prn pain; Qty200; R1; Rx.  Flo Shanks 08/10/2012, 5:35 PM

## 2012-08-13 NOTE — Anesthesia Procedure Notes (Signed)
Procedure Name: Intubation Date/Time: 08/13/2012 8:00 AM Performed by: Zenia Resides D Pre-anesthesia Checklist: Patient identified, Emergency Drugs available, Suction available and Patient being monitored Patient Re-evaluated:Patient Re-evaluated prior to inductionOxygen Delivery Method: Circle System Utilized Preoxygenation: Pre-oxygenation with 100% oxygen Intubation Type: IV induction Ventilation: Mask ventilation without difficulty Laryngoscope Size: Mac and 3 Grade View: Grade II Tube type: Oral Tube size: 5.5 mm Number of attempts: 1 Airway Equipment and Method: stylet and oral airway Placement Confirmation: ETT inserted through vocal cords under direct vision,  positive ETCO2 and breath sounds checked- equal and bilateral Secured at: 20 cm Tube secured with: Tape Dental Injury: Teeth and Oropharynx as per pre-operative assessment  Difficulty Due To: Difficult Airway- due to anterior larynx

## 2012-08-13 NOTE — Anesthesia Preprocedure Evaluation (Addendum)
Anesthesia Evaluation  Patient identified by MRN, date of birth, ID band Patient awake    Reviewed: Allergy & Precautions, H&P , NPO status , Patient's Chart, lab work & pertinent test results, reviewed documented beta blocker date and time   Airway Mallampati: II TM Distance: >3 FB Neck ROM: Full    Dental no notable dental hx. (+) Teeth Intact and Dental Advisory Given   Pulmonary shortness of breath,  breath sounds clear to auscultation  Pulmonary exam normal       Cardiovascular hypertension, On Medications and On Home Beta Blockers + CAD, + Past MI and + CABG Rhythm:Regular Rate:Normal     Neuro/Psych TIAnegative psych ROS   GI/Hepatic Neg liver ROS, GERD-  Medicated and Controlled,  Endo/Other  diabetes, Well Controlled, Type 2, Oral Hypoglycemic Agents  Renal/GU negative Renal ROS  negative genitourinary   Musculoskeletal   Abdominal   Peds  Hematology negative hematology ROS (+) anemia ,   Anesthesia Other Findings   Reproductive/Obstetrics negative OB ROS                           Anesthesia Physical Anesthesia Plan  ASA: III  Anesthesia Plan: General   Post-op Pain Management:    Induction: Intravenous  Airway Management Planned: Oral ETT  Additional Equipment:   Intra-op Plan:   Post-operative Plan: Extubation in OR  Informed Consent: I have reviewed the patients History and Physical, chart, labs and discussed the procedure including the risks, benefits and alternatives for the proposed anesthesia with the patient or authorized representative who has indicated his/her understanding and acceptance.   Dental advisory given  Plan Discussed with: CRNA  Anesthesia Plan Comments:         Anesthesia Quick Evaluation

## 2012-08-14 ENCOUNTER — Encounter (HOSPITAL_BASED_OUTPATIENT_CLINIC_OR_DEPARTMENT_OTHER): Payer: Self-pay | Admitting: Otolaryngology

## 2012-08-14 ENCOUNTER — Other Ambulatory Visit: Payer: Self-pay | Admitting: *Deleted

## 2012-08-14 DIAGNOSIS — D630 Anemia in neoplastic disease: Secondary | ICD-10-CM

## 2012-08-14 LAB — GLUCOSE, CAPILLARY: Glucose-Capillary: 115 mg/dL — ABNORMAL HIGH (ref 70–99)

## 2012-08-14 MED ORDER — LENALIDOMIDE 10 MG PO CAPS: 10.0000 mg | ORAL_CAPSULE | ORAL | Status: DC

## 2012-08-14 NOTE — Addendum Note (Signed)
Addended by: Arvilla Meres on: 08/14/2012 11:28 AM   Modules accepted: Orders

## 2012-08-14 NOTE — Telephone Encounter (Signed)
PRESCRIPTION WAS FAXED TO [facility].

## 2012-08-14 NOTE — Telephone Encounter (Signed)
THIS REFILL REQUEST FOR REVLIMID WAS GIVEN TO DR.MURINSON'S NURSE, ROBIN BASS,RN. 

## 2012-08-14 NOTE — Telephone Encounter (Signed)
RECEIVED A FAX FROM [facility] CONCERNING A CONFIRMATION OF FACSIMILE RECEIPT FOR PT. REFERRAL. 

## 2012-08-16 ENCOUNTER — Ambulatory Visit: Payer: Medicare Other

## 2012-08-16 ENCOUNTER — Other Ambulatory Visit: Payer: Medicare Other | Admitting: Lab

## 2012-08-16 NOTE — Telephone Encounter (Signed)
RECEIVED A FAX FROM [facility] CONCERNING A CONFIRMATION OF PRESCRIPTION SHIPMENT FOR REVLIMID ON 08/16/11.

## 2012-08-17 ENCOUNTER — Other Ambulatory Visit: Payer: Self-pay | Admitting: Lab

## 2012-08-17 ENCOUNTER — Ambulatory Visit: Payer: Self-pay | Admitting: Physician Assistant

## 2012-08-17 ENCOUNTER — Ambulatory Visit: Payer: Self-pay

## 2012-08-20 ENCOUNTER — Ambulatory Visit: Payer: Medicare Other | Admitting: Physician Assistant

## 2012-08-22 ENCOUNTER — Ambulatory Visit (HOSPITAL_BASED_OUTPATIENT_CLINIC_OR_DEPARTMENT_OTHER): Payer: Medicare Other

## 2012-08-22 ENCOUNTER — Telehealth: Payer: Self-pay | Admitting: *Deleted

## 2012-08-22 ENCOUNTER — Other Ambulatory Visit (HOSPITAL_BASED_OUTPATIENT_CLINIC_OR_DEPARTMENT_OTHER): Payer: Medicare Other | Admitting: Lab

## 2012-08-22 ENCOUNTER — Telehealth: Payer: Self-pay | Admitting: Oncology

## 2012-08-22 ENCOUNTER — Telehealth: Payer: Self-pay | Admitting: Medical Oncology

## 2012-08-22 ENCOUNTER — Ambulatory Visit (HOSPITAL_BASED_OUTPATIENT_CLINIC_OR_DEPARTMENT_OTHER): Payer: Medicare Other | Admitting: Physician Assistant

## 2012-08-22 VITALS — BP 102/57 | HR 85 | Temp 97.2°F | Resp 20 | Ht <= 58 in | Wt 105.7 lb

## 2012-08-22 DIAGNOSIS — E612 Magnesium deficiency: Secondary | ICD-10-CM

## 2012-08-22 DIAGNOSIS — C9000 Multiple myeloma not having achieved remission: Secondary | ICD-10-CM

## 2012-08-22 DIAGNOSIS — D649 Anemia, unspecified: Secondary | ICD-10-CM

## 2012-08-22 DIAGNOSIS — Z5112 Encounter for antineoplastic immunotherapy: Secondary | ICD-10-CM

## 2012-08-22 LAB — CBC WITH DIFFERENTIAL/PLATELET
BASO%: 0.7 % (ref 0.0–2.0)
Basophils Absolute: 0 10*3/uL (ref 0.0–0.1)
EOS%: 1.9 % (ref 0.0–7.0)
HCT: 31 % — ABNORMAL LOW (ref 34.8–46.6)
MCH: 32.9 pg (ref 25.1–34.0)
MCHC: 33.2 g/dL (ref 31.5–36.0)
MCV: 99 fL (ref 79.5–101.0)
MONO%: 20 % — ABNORMAL HIGH (ref 0.0–14.0)
NEUT%: 55.3 % (ref 38.4–76.8)
lymph#: 0.9 10*3/uL (ref 0.9–3.3)

## 2012-08-22 LAB — COMPREHENSIVE METABOLIC PANEL (CC13)
Alkaline Phosphatase: 71 U/L (ref 40–150)
BUN: 15.7 mg/dL (ref 7.0–26.0)
CO2: 25 mEq/L (ref 22–29)
Creatinine: 1 mg/dL (ref 0.6–1.1)
Glucose: 190 mg/dl — ABNORMAL HIGH (ref 70–99)
Total Bilirubin: 0.49 mg/dL (ref 0.20–1.20)
Total Protein: 7.5 g/dL (ref 6.4–8.3)

## 2012-08-22 MED ORDER — BORTEZOMIB CHEMO SQ INJECTION 3.5 MG (2.5MG/ML)
1.3000 mg/m2 | Freq: Once | INTRAMUSCULAR | Status: AC
  Administered 2012-08-22: 2 mg via SUBCUTANEOUS
  Filled 2012-08-22: qty 2

## 2012-08-22 MED ORDER — ONDANSETRON HCL 8 MG PO TABS
8.0000 mg | ORAL_TABLET | Freq: Once | ORAL | Status: AC
  Administered 2012-08-22: 8 mg via ORAL

## 2012-08-22 NOTE — Progress Notes (Signed)
[facility]  [phone]    OFFICE PROGRESS NOTE  CC: Christina Hopkins, M.D.  [name]. [name], M.D.  [name], M.D.   PROBLEM LIST:   1. Multiple myeloma, initially presenting as an IgA kappa monoclonal gammopathy in February 2009. 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 This was felt to be an adverse prognostic determinant. Bone marrow on 08/31/2007 showed 46% plasma cells. A repeat bone marrow on 08/05/2009 showed 73% plasma cells.  Treatment with Revlimid was started in April 2011. Zometa was started in October 2011 and 2 years of treatment were concluded on 02/23/2012. We had previously started Aranesp for the patient's anemia in May of 2010. Most recent metastatic bone survey was on 08/05/2009. Other x-rays since then are available. Maximum IgA level was 2080 on 08/11/2009. Subcutaneous Velcade was added to the patient's treatment program on 06/14/2011 because of a rising IgA level 1080 on 06/02/2011. The patient is now receiving Velcade every 2 weeks in combination with Revlimid being given 10 mg 1 week on 1 week off and Aranesp 300 mcg subcu every 4 weeks for hemoglobin less than or equal to 10. 2. Anemia secondary to vitamin B12 deficiency. The patient had been on oral vitamin B12. However, her vitamin B12 level fell to 284 on 07/26/2011 and vitamin B12 shots 1000 mcg given IM every month was resumed on 09/06/2011. Last shot was given on 08/02/12. 3. Anemia secondary to iron deficiency.  4. Vitamin D deficiency.  5. Magnesium deficiency.  6. Diabetes mellitus.  7. Hypertension.  8. Dyslipidemia.  9. Coronary artery disease.  10. Right anterior thigh mass, most likely a lipoma.  11. Multiple vertebral compression fractures as seen on MRI of the lumbar spine on 08/27/2011. Kyphoplasty was carried out at L5, T11 and T12 on 10/07/2011 by Dr. Julieanne Cotton.  12. Left vocal cord paralysis, complete, noted 05/06/2012, s/p Radiesse vocal cord  augmentation for a paralyzed left vocal cord on 08/13/12 by Dr. [name] with improvement of symptoms  .     MEDICATIONS:  Current Outpatient Prescriptions  Medication Sig Dispense Refill  . Acetaminophen (TYLENOL ARTHRITIS EXT RELIEF PO) Take by mouth.      Marland Kitchen acyclovir (ZOVIRAX) 400 MG tablet Take 1 tablet (400 mg total) by mouth 2 (two) times daily.  60 tablet  3  . Alum & Mag Hydroxide-Simeth (MAGIC MOUTHWASH) SOLN Take by mouth as needed.      Marland Kitchen aspirin 81 MG tablet Take 81 mg by mouth daily.        Marland Kitchen atorvastatin (LIPITOR) 40 MG tablet Take 40 mg by mouth daily.        . carvedilol (COREG) 25 MG tablet Take 25 mg by mouth 2 (two) times daily with a meal.      . cholecalciferol (VITAMIN D) 1000 UNITS tablet Take 2,000-5,000 Units by mouth 2 (two) times daily. 5000 units at AM and 2000 units at night      . cyclobenzaprine (FLEXERIL) 10 MG tablet Take 10 mg by mouth 3 (three) times daily as needed. For muscle spasm      . HYDROcodone-acetaminophen (NORCO) 10-325 MG per tablet Take 1 tablet by mouth every 6 (six) hours as needed.  30 tablet  3  . lenalidomide (REVLIMID) 10 MG capsule Take 1 capsule (10 mg total) by mouth See admin instructions. Take 1 po daily x 7 days on, then 7 days off.  14 capsule  0  . lisinopril (PRINIVIL,ZESTRIL) 2.5 MG  tablet Take 1 tablet (2.5 mg total) by mouth daily.  30 tablet  0  . Magnesium 400 MG CAPS Take 1 capsule by mouth 4 (four) times daily.  120 capsule  4  . metFORMIN (GLUCOPHAGE) 500 MG tablet Take 500 mg by mouth 2 (two) times daily with a meal.        . mirtazapine (REMERON) 15 MG tablet Take 15 mg by mouth at bedtime.        . Multiple Vitamins-Minerals (CENTRUM SILVER PO) Take 1 tablet by mouth daily.       . pantoprazole (PROTONIX) 40 MG tablet Take 40 mg by mouth daily.        Bertram Gala Glycol-Propyl Glycol (SYSTANE OP) Place 2 drops into both eyes daily.      . prochlorperazine (COMPAZINE) 10 MG tablet Take 1 tablet (10 mg total) by mouth  every 6 (six) hours as needed. For nausea  30 tablet  3  . spironolactone (ALDACTONE) 25 MG tablet Take 25 mg by mouth daily.       No current facility-administered medications for this visit.   Facility-Administered Medications Ordered in Other Visits  Medication Dose Route Frequency Provider Last Rate Last Dose  . darbepoetin (ARANESP) injection 300 mcg  300 mcg Subcutaneous Once Samul Dada, MD       TREATMENT PROGRAM:   1. Revlimid 10 mg daily, 1 week on/1 week off.  2. Velcade 2 mg subcutaneous, now being given every 2 weeks. Velcade was started on 06/14/2011.  3. Aranesp 300 mcg subcu every 2 weeks for hemoglobin less than or  equal to 10.  4. Vitamin B12 1000 mcg IM monthly, restarted on 09/06/2011.  5. Zometa 3.5 mg IV was administered from 02/14/2010 through 02/23/2012.   IMMUNIZATIONS:   1. Pneumovax was apparently given about 9 years ago when the patient was 79 years old.  2. Flu shot was given on 01/27/2012.   SMOKING HISTORY: The patient has never smoked cigarettes.    ALLERGIES:   Allergies  Allergen Reactions  . Gemfibrozil Nausea And Vomiting  . Nifedipine Hives  . Questran (Cholestyramine) Other (See Comments)    Do not remember   HISTORY: Christina Hopkins was seen today for followup of her IgA kappa multiple myeloma. [name] was accompanied by her daughter, Christina Hopkins. She was last seen by [place] on 07/19/12 and prior to that on  06/14/2012. Unfortunately [name] has had some significant problems that developed in late December. The patient apparently developed an episode of garbled speech and difficulty with pronunciation in late December 2013. She went to the emergency room and was felt to have a possible TIA. She received IV fluids. Her speech cleared and there were no other neurologic issues at that time. Apparently 2 days later, the patient developed rather suddenly some hoarseness. This may have been progressive. There were also concerns the patient was having  some aspiration. The patient has been evaluated by Dr. Lesia Sago from  neurology and Dr. Flo Shanks from ENT. The patient has had an extensive workup. I believe the conclusion is that she has not had a stroke. The etiology of her left vocal cord paralysis is unclear.She underwent Radiesse vocal cord augmentation for a paralyzed left vocal cord on 08/13/12 by Dr. [name] .Her symptoms are improved. Her voice is now more audible. According to her daughter, she is close to her baseline. She can swallow solids although she prefers pureed foods. Her weight has decreased about 4  lbs due to the recent events. She takes Boost 1 daily. She is considering increasing to at least 2 a day.   With regard to the patient's multiple myeloma, we have been concerned that her IgA level has been increasing over the past 6-8 months. At one time the IgA level was in the 200 range. On 02/23/2012 IgA level was 449 and on 04/19/2012 the IgA level was 704. On 07/19/12 it was 1160.  Beginning in mid January the patient has been receiving Velcade 2 mg subcutaneous every 2 weeks.Last dose was given on 08/02/12. It should be noted that the patient took her new cycle of  Revlimid on 4/8  She is on 1 week on/1 week off. She receives vitamin B12 monthly, last on 08/02/12. She has being getting Aranesp 300 mcg subcu every couple weeks whenever the hemoglobin is less than or equal to 10. Last dose given on 06/14/12. No other new issues are verbalized.       PHYSICAL EXAMINATION:   Filed Vitals:   08/22/12 0903  BP: 102/57  Pulse: 85  Temp: 97.2 F (36.2 C)  Resp: 20   Filed Weights   08/22/12 0903  Weight: 105 lb 11.2 oz (47.945 kg)   The patient is elderly and frail. She is 79 years old. She is in no acute distress. Her voice is now audible. She has a marked dorsal kyphosis and shortening of her height.  There is no scleral icterus. Mouth and pharynx are benign although the patient protrudes her tongue, less than before though..  This is suggestive of an extrapyramidal reaction. The patient is on Remeron and has been on this for many, many years for appetite stimulation. Dr. Arline Asp had suggested we stop this, but she did continue to take it. She has no adenopathy. Lungs are clear to auscultation.  Cardiac exam regular rhythm without murmur or rub. The patient does not have a Port-A-Cath or central catheter. Abdomen with the patient sitting is benign. Extremities: No peripheral edema or clubbing. The patient has a rolling walker. In the past we have noted a lipoma involving the right lower proximal thigh. Neurologic exam was not carried out in detail, seems unchanged.     LABORATORY/RADIOLOGY DATA:   Recent Labs Lab 08/22/12 0841  WBC 4.2  HGB 10.3*  HCT 31.0*  PLT 144*  MCV 99.0  MCH 32.9  MCHC 33.2  RDW 16.6*  LYMPHSABS 0.9  MONOABS 0.8  EOSABS 0.1  BASOSABS 0.0    CMP    Recent Labs Lab 08/22/12 0841 08/22/12 1054  NA 133*  --   K 4.0  --   CL 98  --   CO2 25  --   GLUCOSE 190*  --   BUN 15.7  --   CREATININE 1.0  --   CALCIUM 9.7  --   MG  --  1.7  AST 17  --   ALT 13  --   ALKPHOS 71  --   BILITOT 0.49  --         Component Value Date/Time   BILITOT 0.49 08/22/2012 0841   BILITOT 0.3 05/04/2012 1500   White count on 07/19/12 was  2.9, ANC 1.5, hemoglobin 10.1, hematocrit 30.0, platelets 133,000.  Chemistries today notable only for a sodium of 134, glucose 134 and albumin 3.2. Total protein is 7.2 and thus globulins are 4.0. LDH 137.  Quantitative immunoglobulins are pending. IgA level on 06/14/2012 was 806 as compared with 704 on 04/19/2012 and  449 on 02/23/2012. Vitamin D level on 07/04/2012 was 52. Vitamin B12 level on 04/19/2012 was 531.    Radiology Studies: 1. Most recent metastatic bone survey was on 08/05/2009. Prior to that, we had a metastatic bone survey from 11/03/2008 and 07/23/2007. The metastatic bone survey from 08/05/2009 stated that there were no findings to  strongly suggest metastatic disease. There were multiple sites of degenerative change.  2. Chest x-ray from 02/09/2010 showed new or increased T6 and T7 compression fractures compared with the skeletal survey of 08/05/2009.  3. CT scan of head without IV contrast showed no acute findings. The calvarium was intact. There was a probable old lacunar infarct in the right basal ganglia.  4. There are x-rays of the left hip and lumbar spine from 07/16/2010. There are x-rays of the left humerus and cervical spine from 06/30/2010.  5. An ultrasound of the right lower extremity was carried out on 08/18/2010 and did not show a discrete mass in the right thigh.  6. Screening mammogram from 02/01/2011 carried out at [facility] was negative.  7. MRI of the lumbar spine with and without IV contrast on 08/27/2011 showed acute/subacute superior endplate fracture of L5 with minimal loss of vertebral body height and no significant retropulsion.  There was a nonhealed superior endplate compression fracture at T11 and T12 with associated kyphosis. There was central canal stenosis greatest at L4-5, foraminal stenosis greatest at L2-3 and L3-4, and nonhealed sacral insufficiency fractures. There was multilevel spondylosis of the lumbar spine.  8. Kyphoplasty at L5, T11 and T12 carried out on 10/07/2011 by Dr. Julieanne Cotton was carried out successfully. Biopsies showed blood clot and hypercellular marrow with increased plasma cell infiltrates at L5, T11 and T12.  9. Chest x-ray, 2 view, from 05/04/2012 showed enlargement of the cardiac silhouette and evidence of CABG. There were chronic bronchitic changes with left basilar scarring. Osteoporosis with multiple thoracic spine compression fractures was present. There were no acute abnormalities.  10. X-rays of thoracic spine, 2 view, from 05/04/2012 showed evidence of mid T3 and L1 compression fractures which are new since 2011 and April 2013 respectively. There was  interval augmentation of T11 and T12 fractures as well as chronic T6 and T7 compression fractures.  11. CT of the head without IV contrast on 05/04/2012 showed no acute intracranial abnormalities and no significant changes compared with the head CT from 07/16/2010.  12. MRI of the thoracic spine without IV contrast on 05/05/2012 confirmed the acute/subacute inferior endplate compression fracture of C3 with approximately 25% loss of height. The remote inferior endplate fractures at T6 and T7 demonstrate some residual edema suggesting these are incompletely healed or have continued to fracture. There was a superior endplate compression fracture at T10 with minimal loss of height. There was evidence of vertebral augmentation at T11 and T12 without residual edema. There was mild spondylosis of the thoracic spine and small bilateral pleural  effusions with associated atelectasis.  13. Swallowing evaluation on 06/04/2012 showed severe oral phase dysphagia, severe pharyngeal phase dysphagia and moderate cervical esophageal phase dysphagia. Aspiration of secretions and liquids were noted.  14. CT scan of the neck with IV contrast on 07/12/2012 showed lateral deviation of the left vocal cord compatible with paralysis. There was no focal lesion along the course of the recurrent laryngeal nerve to explain the finding. There was no primary mucosal neoplasm or evidence of metastatic disease. There was extensive  atherosclerotic change without significant stenosis. There was moderate spondylosis of  the cervical spine. There was an 8 mm obstructing stone in the proximal right submandibular duct with chronic atrophy of the right submandibular gland.  15. An MRI of the brain without IV contrast was carried out at [facility] on 07/15/12. We will try to obtain a copy of that report. 16.Ct Chest with  Contrast 07/25/2012  No mediastinal mass or other etiology to explain the presence of previously seen left vocal cord  paralysis.  Slight interval progression of T10 superior endplate compression deformity.       ASSESSMENT AND PLAN:   Dr. Arline Asp has seen and evaluated the patient and reviewed the chart.  We are concerned about progression of the patient's myeloma with a rising IgA level over the past few months. For that reason the interval between Velcade treatments was decreased from every 4 weeks to every 2 weeks beginning in mid January. The patient is due for subcutaneous Velcade today, 2 mg. She takes Revlimid 10 mg daily 1 week on/1 week off. She in initiated her new cycle on 08/21/2012.The patient is not due for vitamin B12 today as she received it on 08/02/12. In addition, her hemoglobin today is 10.3 and thus she does not require any Aranesp today. She did receive Aranesp 300 mcg subcu on 02/19. The patient also may have some extrapyramidal affects from her Remeron. Her tongue seems to continue to protrude  In addition, there are some abnormal mouth movements.Dr. Arline Asp  instructed the patient and Christina Hopkins to stop the Remeron.We will have the patient return in 2 weeks which will be 09/05/2012 at which time she will have a CBC and another dose of Velcade 2 mg subcutaneous. We will plan to see the patient again in approximately 4 weeks around 09/19/12 at which time we will check CBC, chemistries, IgA level and serum light chains. If it appears that the IgA level is still increasing we may consider decreasing the interval between Velcade treatments to every week, adding Decadron or perhaps changing to alternative treatment which could include pomalidomide or carfilzomib. Today's session was extended approximately 45 minutes due to the review of  records relating to recent  Procedures and symptoms.     Timmi Devora E, PA-C 08/22/2012, 11:07 AM

## 2012-08-22 NOTE — Telephone Encounter (Signed)
Per staff message and POF I have scheduled appts.  [name]  

## 2012-08-22 NOTE — Patient Instructions (Addendum)
[facility] Discharge Instructions for Patients Receiving Chemotherapy  Today you received the following chemotherapy agents :  Velcade.  To help prevent nausea and vomiting after your treatment, we encourage you to take your nausea medication as directed.   If you develop nausea and vomiting that is not controlled by your nausea medication, call the clinic. If it is after clinic hours your family physician or the after hours number for the clinic or go to the Emergency Department.   BELOW ARE SYMPTOMS THAT SHOULD BE REPORTED IMMEDIATELY:  *FEVER GREATER THAN 100.5 F  *CHILLS WITH OR WITHOUT FEVER  NAUSEA AND VOMITING THAT IS NOT CONTROLLED WITH YOUR NAUSEA MEDICATION  *UNUSUAL SHORTNESS OF BREATH  *UNUSUAL BRUISING OR BLEEDING  TENDERNESS IN MOUTH AND THROAT WITH OR WITHOUT PRESENCE OF ULCERS  *URINARY PROBLEMS  *BOWEL PROBLEMS  UNUSUAL RASH Items with * indicate a potential emergency and should be followed up as soon as possible.   Feel free to call the clinic you have any questions or concerns. The clinic phone number is [phone].   I have been informed and understand all the instructions given to me. I know to contact the clinic, my physician, or go to the Emergency Department if any problems should occur. I do not have any questions at this time, but understand that I may call the clinic during office hours   should I have any questions or need assistance in obtaining follow up care.    __________________________________________  _____________  __________ Signature of Patient or Authorized Representative            Date                   Time    __________________________________________ Nurse's Signature    

## 2012-08-22 NOTE — Telephone Encounter (Signed)
I called Christina Hopkins to let her know her mother's magnesium is 1.7. She had asked Dr. Arline Asp today if she can decrease her dose. Per Dr. Arline Asp she can decrease it or stop it. I asked her to call me back and let me know what she would like to do.

## 2012-08-23 ENCOUNTER — Encounter: Payer: Self-pay | Admitting: Oncology

## 2012-08-23 ENCOUNTER — Telehealth: Payer: Self-pay | Admitting: Oncology

## 2012-08-23 ENCOUNTER — Telehealth: Payer: Self-pay | Admitting: *Deleted

## 2012-08-23 DIAGNOSIS — C9000 Multiple myeloma not having achieved remission: Secondary | ICD-10-CM

## 2012-08-23 NOTE — Telephone Encounter (Signed)
Per staff phone call and POF I have schedueld appts.  [name]  

## 2012-08-23 NOTE — Progress Notes (Signed)
This patient's IgA level has been increasing recently. IgA level on 08/22/2012 was 1480, as compared with 1160 on 07/19/2012 and 806 on 06/14/2012.  Recently we have been treating her with subcutaneous Velcade every 2 weeks and Revlimid 10 mg daily, one-week on, one-week off. The patient has been on Revlimid since April 2011. We started Velcade on 06/14/2011 because of a rising IgA level when the patient was on single agent Revlimid. The IgA level came down nicely when we added Velcade to the Revlimid.  The patient has never been treated with alkylating agents.    At this point I plan to discontinue the Revlimid. We are going to treat the patient's with weekly Velcade 2 mg subcutaneous, Cytoxan 400 mg IV and Decadron 20 mg IV starting this coming Wednesday, 08/29/2012.  We'll be checking weekly CBCs. I have ordered an IgA level and serum light chains for 09/05/2012. I'm scheduled to see the patient on 09/19/2012.

## 2012-08-24 ENCOUNTER — Telehealth: Payer: Self-pay

## 2012-08-24 ENCOUNTER — Telehealth: Payer: Self-pay | Admitting: *Deleted

## 2012-08-24 LAB — IGA: IgA: 1480 mg/dL — ABNORMAL HIGH (ref 69–380)

## 2012-08-24 LAB — KAPPA/LAMBDA LIGHT CHAINS
Kappa free light chain: 16.4 mg/dL — ABNORMAL HIGH (ref 0.33–1.94)
Lambda Free Lght Chn: 1.11 mg/dL (ref 0.57–2.63)

## 2012-08-24 NOTE — Telephone Encounter (Signed)
S/w mary about plan to stop revlimid and do velcade/cytoxan/decadron weekly with labs. [name] said she will call Nicey.

## 2012-08-24 NOTE — Telephone Encounter (Signed)
S/w mary about IgA levels and went over appt times

## 2012-08-24 NOTE — Telephone Encounter (Signed)
Per staff phone call and POF I have schedueld appts.  [name]  

## 2012-08-29 ENCOUNTER — Ambulatory Visit (HOSPITAL_BASED_OUTPATIENT_CLINIC_OR_DEPARTMENT_OTHER): Payer: Medicare Other

## 2012-08-29 ENCOUNTER — Other Ambulatory Visit (HOSPITAL_BASED_OUTPATIENT_CLINIC_OR_DEPARTMENT_OTHER): Payer: Medicare Other | Admitting: Lab

## 2012-08-29 VITALS — BP 123/54 | HR 68 | Temp 97.1°F | Resp 17

## 2012-08-29 DIAGNOSIS — C9000 Multiple myeloma not having achieved remission: Secondary | ICD-10-CM

## 2012-08-29 DIAGNOSIS — Z5112 Encounter for antineoplastic immunotherapy: Secondary | ICD-10-CM

## 2012-08-29 LAB — CBC WITH DIFFERENTIAL/PLATELET
Basophils Absolute: 0 10*3/uL (ref 0.0–0.1)
Eosinophils Absolute: 0.2 10*3/uL (ref 0.0–0.5)
HGB: 10.3 g/dL — ABNORMAL LOW (ref 11.6–15.9)
MCV: 96.8 fL (ref 79.5–101.0)
MONO#: 0.4 10*3/uL (ref 0.1–0.9)
MONO%: 10.6 % (ref 0.0–14.0)
NEUT#: 2 10*3/uL (ref 1.5–6.5)
RBC: 3.15 10*6/uL — ABNORMAL LOW (ref 3.70–5.45)
RDW: 16.9 % — ABNORMAL HIGH (ref 11.2–14.5)
WBC: 4 10*3/uL (ref 3.9–10.3)
lymph#: 1.4 10*3/uL (ref 0.9–3.3)

## 2012-08-29 MED ORDER — BORTEZOMIB CHEMO SQ INJECTION 3.5 MG (2.5MG/ML)
1.3000 mg/m2 | Freq: Once | INTRAMUSCULAR | Status: AC
  Administered 2012-08-29: 2 mg via SUBCUTANEOUS
  Filled 2012-08-29: qty 2

## 2012-08-29 MED ORDER — ONDANSETRON HCL 8 MG PO TABS
8.0000 mg | ORAL_TABLET | Freq: Once | ORAL | Status: AC
  Administered 2012-08-29: 8 mg via ORAL

## 2012-08-29 MED ORDER — DEXAMETHASONE SODIUM PHOSPHATE 4 MG/ML IJ SOLN
20.0000 mg | Freq: Once | INTRAMUSCULAR | Status: AC
  Administered 2012-08-29: 20 mg via INTRAVENOUS

## 2012-08-29 MED ORDER — SODIUM CHLORIDE 0.9 % IV SOLN
400.0000 mg | Freq: Once | INTRAVENOUS | Status: AC
  Administered 2012-08-29: 400 mg via INTRAVENOUS
  Filled 2012-08-29: qty 20

## 2012-08-29 NOTE — Progress Notes (Signed)
Ok to treat per Dr. Arline Asp.

## 2012-08-29 NOTE — Patient Instructions (Addendum)
[facility] Discharge Instructions for Patients Receiving Chemotherapy  Today you received the following chemotherapy agents Velcade/Cytoxan.  To help prevent nausea and vomiting after your treatment, we encourage you to take your nausea medication as prescribed.   If you develop nausea and vomiting that is not controlled by your nausea medication, call the clinic. If it is after clinic hours your family physician or the after hours number for the clinic or go to the Emergency Department.   BELOW ARE SYMPTOMS THAT SHOULD BE REPORTED IMMEDIATELY:  *FEVER GREATER THAN 100.5 F  *CHILLS WITH OR WITHOUT FEVER  NAUSEA AND VOMITING THAT IS NOT CONTROLLED WITH YOUR NAUSEA MEDICATION  *UNUSUAL SHORTNESS OF BREATH  *UNUSUAL BRUISING OR BLEEDING  TENDERNESS IN MOUTH AND THROAT WITH OR WITHOUT PRESENCE OF ULCERS  *URINARY PROBLEMS  *BOWEL PROBLEMS  UNUSUAL RASH Items with * indicate a potential emergency and should be followed up as soon as possible.  One of the nurses will contact you 24 hours after your treatment. Please let the nurse know about any problems that you may have experienced. Feel free to call the clinic you have any questions or concerns. The clinic phone number is [phone].   I have been informed and understand all the instructions given to me. I know to contact the clinic, my physician, or go to the Emergency Department if any problems should occur. I do not have any questions at this time, but understand that I may call the clinic during office hours   should I have any questions or need assistance in obtaining follow up care.    __________________________________________  _____________  __________ Signature of Patient or Authorized Representative            Date                   Time    __________________________________________ Nurse's Signature

## 2012-08-30 ENCOUNTER — Telehealth: Payer: Self-pay | Admitting: Medical Oncology

## 2012-08-30 ENCOUNTER — Telehealth: Payer: Self-pay | Admitting: *Deleted

## 2012-08-30 NOTE — Telephone Encounter (Signed)
Mary-daughter called and left a message that her mother had diarrhea all night. She did take imodium but it has not helped. I called pt to see how she is doing and to see how many imodium she has taken. She states she is starting to feel better. She has taken 2 imodium this morning and it seems to be helping. I informed her she can take 2 imodium after each stool up to 8 per day. I stressed to drink lots of fluids. I asked her to call back if she does not continue to get better. We discussed if this if from the chemo or the GI bug that is going around. She will call me if she does not get better.

## 2012-08-30 NOTE — Telephone Encounter (Signed)
Per desk nurse, see telephone notes

## 2012-09-04 ENCOUNTER — Other Ambulatory Visit: Payer: Self-pay | Admitting: Medical Oncology

## 2012-09-04 MED ORDER — PROCHLORPERAZINE MALEATE 10 MG PO TABS: 10.0000 mg | ORAL_TABLET | Freq: Four times a day (QID) | ORAL | Status: DC | PRN

## 2012-09-05 ENCOUNTER — Emergency Department (HOSPITAL_COMMUNITY)
Admission: EM | Admit: 2012-09-05 | Discharge: 2012-09-05 | Disposition: A | Discharge status: Home / Self Care | Payer: Medicare Other | Attending: Emergency Medicine | Admitting: Emergency Medicine

## 2012-09-05 ENCOUNTER — Other Ambulatory Visit (HOSPITAL_BASED_OUTPATIENT_CLINIC_OR_DEPARTMENT_OTHER): Payer: Medicare Other | Admitting: Lab

## 2012-09-05 ENCOUNTER — Ambulatory Visit: Payer: Medicare Other

## 2012-09-05 ENCOUNTER — Encounter (HOSPITAL_COMMUNITY): Payer: Self-pay | Admitting: *Deleted

## 2012-09-05 ENCOUNTER — Emergency Department (HOSPITAL_COMMUNITY): Payer: Medicare Other

## 2012-09-05 DIAGNOSIS — Z8739 Personal history of other diseases of the musculoskeletal system and connective tissue: Secondary | ICD-10-CM | POA: Insufficient documentation

## 2012-09-05 DIAGNOSIS — E785 Hyperlipidemia, unspecified: Secondary | ICD-10-CM | POA: Insufficient documentation

## 2012-09-05 DIAGNOSIS — Z85038 Personal history of other malignant neoplasm of large intestine: Secondary | ICD-10-CM | POA: Insufficient documentation

## 2012-09-05 DIAGNOSIS — C9000 Multiple myeloma not having achieved remission: Secondary | ICD-10-CM

## 2012-09-05 DIAGNOSIS — R1084 Generalized abdominal pain: Secondary | ICD-10-CM | POA: Insufficient documentation

## 2012-09-05 DIAGNOSIS — K219 Gastro-esophageal reflux disease without esophagitis: Secondary | ICD-10-CM | POA: Insufficient documentation

## 2012-09-05 DIAGNOSIS — Z951 Presence of aortocoronary bypass graft: Secondary | ICD-10-CM | POA: Insufficient documentation

## 2012-09-05 DIAGNOSIS — I252 Old myocardial infarction: Secondary | ICD-10-CM | POA: Insufficient documentation

## 2012-09-05 DIAGNOSIS — Z79899 Other long term (current) drug therapy: Secondary | ICD-10-CM | POA: Insufficient documentation

## 2012-09-05 DIAGNOSIS — R197 Diarrhea, unspecified: Secondary | ICD-10-CM

## 2012-09-05 DIAGNOSIS — I2510 Atherosclerotic heart disease of native coronary artery without angina pectoris: Secondary | ICD-10-CM | POA: Insufficient documentation

## 2012-09-05 DIAGNOSIS — E119 Type 2 diabetes mellitus without complications: Secondary | ICD-10-CM | POA: Insufficient documentation

## 2012-09-05 DIAGNOSIS — Z7982 Long term (current) use of aspirin: Secondary | ICD-10-CM | POA: Insufficient documentation

## 2012-09-05 DIAGNOSIS — I10 Essential (primary) hypertension: Secondary | ICD-10-CM | POA: Insufficient documentation

## 2012-09-05 DIAGNOSIS — Z862 Personal history of diseases of the blood and blood-forming organs and certain disorders involving the immune mechanism: Secondary | ICD-10-CM | POA: Insufficient documentation

## 2012-09-05 DIAGNOSIS — E860 Dehydration: Secondary | ICD-10-CM | POA: Insufficient documentation

## 2012-09-05 LAB — URINALYSIS, ROUTINE W REFLEX MICROSCOPIC
Bilirubin Urine: NEGATIVE
Glucose, UA: NEGATIVE mg/dL
Specific Gravity, Urine: 1.012 (ref 1.005–1.030)
Urobilinogen, UA: 0.2 mg/dL (ref 0.0–1.0)
pH: 5 (ref 5.0–8.0)

## 2012-09-05 LAB — URINE MICROSCOPIC-ADD ON

## 2012-09-05 LAB — CBC WITH DIFFERENTIAL/PLATELET
Basophils Absolute: 0 10*3/uL (ref 0.0–0.1)
Basophils Absolute: 0 10*3/uL (ref 0.0–0.1)
Basophils Relative: 0 % (ref 0–1)
EOS%: 3.1 % (ref 0.0–7.0)
Eosinophils Absolute: 0.1 10*3/uL (ref 0.0–0.5)
Eosinophils Relative: 3 % (ref 0–5)
HCT: 26.8 % — ABNORMAL LOW (ref 34.8–46.6)
HCT: 25.9 % — ABNORMAL LOW (ref 36.0–46.0)
HGB: 9.1 g/dL — ABNORMAL LOW (ref 11.6–15.9)
Lymphocytes Relative: 29 % (ref 12–46)
MCH: 33.6 pg (ref 25.1–34.0)
MCHC: 35.1 g/dL (ref 30.0–36.0)
MCV: 94.5 fL (ref 78.0–100.0)
MONO#: 0.5 10*3/uL (ref 0.1–0.9)
Monocytes Absolute: 0.6 10*3/uL (ref 0.1–1.0)
NEUT#: 1.8 10*3/uL (ref 1.5–6.5)
Platelets: 120 10*3/uL — ABNORMAL LOW (ref 150–400)
RDW: 18.4 % — ABNORMAL HIGH (ref 11.2–14.5)
RDW: 16.6 % — ABNORMAL HIGH (ref 11.5–15.5)
WBC: 3.3 10*3/uL — ABNORMAL LOW (ref 3.9–10.3)
WBC: 3.7 10*3/uL — ABNORMAL LOW (ref 4.0–10.5)
lymph#: 0.9 10*3/uL (ref 0.9–3.3)

## 2012-09-05 LAB — COMPREHENSIVE METABOLIC PANEL
ALT: 12 U/L (ref 0–35)
AST: 18 U/L (ref 0–37)
Albumin: 3.1 g/dL — ABNORMAL LOW (ref 3.5–5.2)
CO2: 25 mEq/L (ref 19–32)
Calcium: 8.9 mg/dL (ref 8.4–10.5)
Creatinine, Ser: 1.15 mg/dL — ABNORMAL HIGH (ref 0.50–1.10)
Sodium: 124 mEq/L — ABNORMAL LOW (ref 135–145)
Total Protein: 6.9 g/dL (ref 6.0–8.3)

## 2012-09-05 MED ORDER — SODIUM CHLORIDE 0.9 % IV SOLN
INTRAVENOUS | Status: DC
  Administered 2012-09-05: 11:00:00 via INTRAVENOUS

## 2012-09-05 MED ORDER — SODIUM CHLORIDE 0.9 % IV BOLUS (SEPSIS)
500.0000 mL | Freq: Once | INTRAVENOUS | Status: AC
  Administered 2012-09-05: 500 mL via INTRAVENOUS

## 2012-09-05 NOTE — ED Notes (Signed)
Patient transported to X-ray 

## 2012-09-05 NOTE — ED Provider Notes (Signed)
History     CSN: [id number]  Arrival date & time 09/05/12  9629   First MD Initiated Contact with Patient 09/05/12 [phone]      Chief Complaint  Patient presents with  . Abdominal Pain  . Diarrhea    (Consider location/radiation/quality/duration/timing/severity/associated sxs/prior treatment) HPI Comments: Christina Hopkins is a 79 y.o. Female presents for evaluation of abdominal pain and diarrhea. She came from the chemotherapy infusion Center after arriving there for her weekly infusion. They felt that since she had persistent pain and diarrhea that she could not be treated today. She has been ill for one week with waxing and waning diarrhea and nausea, but no vomiting. She has not had a fever. Stool is Stradling color and thin. She has between 2 and 4 episodes per day. She takes Imodium. The stool frequency decreased, but she has increased abdominal pain. The pain is crampy in nature. She's not had any rectal bleeding. She is able to tolerate oral liquids and food. She is taking her other medications, as directed. No other modifying factors. No recent antibiotics.  Patient is a 79 y.o. female presenting with abdominal pain and diarrhea. The history is provided by the patient.  Abdominal Pain Associated symptoms: diarrhea   Diarrhea Associated symptoms: abdominal pain     Past Medical History  Diagnosis Date  . Hypertension   . Diabetes mellitus   . Anemia   . Osteoporosis   . Dyslipidemia   . Myocardial infarction   . colon ca dx'd 2003    surg only  . Multiple myeloma(203.0) dx'd 2009    oral chemo ongoing  . Coronary artery disease   . Shortness of breath   . GERD (gastroesophageal reflux disease)   . Wears glasses     Past Surgical History  Procedure Laterality Date  . Coronary artery bypass graft  08/2001    4 vessel  . Colon surgery  2003  . Tonsillectomy    . Appendectomy    . Abdominal hysterectomy    . Back surgery    . Eye surgery      cataracts  . Direct  laryngoscopy with radiaesse injection Left 08/13/2012    Procedure: LEFT VOCAL CORD RADIESSE AUGMENTATION LARYNGOSCOPY ;  Surgeon: Flo Shanks, MD;  Location: [facility];  Service: ENT;  Laterality: Left;    Family History  Problem Relation Age of Onset  . Heart disease Brother   . Diabetes Brother     History  Substance Use Topics  . Smoking status: Never Smoker   . Smokeless tobacco: Never Used  . Alcohol Use: No    OB History   Grav Para Term Preterm Abortions TAB SAB Ect Mult Living                  Review of Systems  Gastrointestinal: Positive for abdominal pain and diarrhea.  All other systems reviewed and are negative.    Allergies  Gemfibrozil; Nifedipine; and Questran  Home Medications   Current Outpatient Rx  Name  Route  Sig  Dispense  Refill  . acetaminophen (TYLENOL) 650 MG CR tablet   Oral   Take 650 mg by mouth every 8 (eight) hours as needed for pain.         Marland Kitchen acyclovir (ZOVIRAX) 400 MG tablet   Oral   Take 1 tablet (400 mg total) by mouth 2 (two) times daily.   60 tablet   3   . Alum & Mag Hydroxide-Simeth (  MAGIC MOUTHWASH) SOLN   Swish & Spit   Swish and spit 5 mLs 4 (four) times daily as needed (for mouth ulcers).          Marland Kitchen aspirin EC 81 MG tablet   Oral   Take 81 mg by mouth daily.         Marland Kitchen atorvastatin (LIPITOR) 40 MG tablet   Oral   Take 40 mg by mouth daily.          . carvedilol (COREG) 25 MG tablet   Oral   Take 25 mg by mouth 2 (two) times daily with a meal.         . Cholecalciferol (VITAMIN D) 2000 UNITS tablet   Oral   Take 2,000 Units by mouth at bedtime.         . Cholecalciferol (VITAMIN D-3) 5000 UNITS TABS   Oral   Take 1 tablet by mouth at bedtime.         . cyanocobalamin (,VITAMIN B-12,) 1000 MCG/ML injection   Intramuscular   Inject 1,000 mcg into the muscle every 30 (thirty) days.         . cyclobenzaprine (FLEXERIL) 10 MG tablet   Oral   Take 10 mg by mouth 3 (three)  times daily as needed. For muscle spasm         . darbepoetin (ARANESP) 300 MCG/0.6ML SOLN   Subcutaneous   Inject 300 mcg into the skin as needed (given if needed for anemia).         Marland Kitchen HYDROcodone-acetaminophen (NORCO) 10-325 MG per tablet   Oral   Take 1 tablet by mouth every 6 (six) hours as needed for pain.         Marland Kitchen lisinopril (PRINIVIL,ZESTRIL) 2.5 MG tablet   Oral   Take 1 tablet (2.5 mg total) by mouth daily.   30 tablet   0   . metFORMIN (GLUCOPHAGE) 500 MG tablet   Oral   Take 500 mg by mouth 2 (two) times daily with a meal.           . Multiple Vitamin (MULTIVITAMIN WITH MINERALS) TABS   Oral   Take 1 tablet by mouth daily.         . pantoprazole (PROTONIX) 40 MG tablet   Oral   Take 40 mg by mouth daily.           Bertram Gala Glycol-Propyl Glycol (SYSTANE OP)   Both Eyes   Place 2 drops into both eyes at bedtime.          Marland Kitchen PRESCRIPTION MEDICATION   Intravenous   Inject into the vein every 7 (seven) days. Velcade and Cytoxan infusion q7d on Wednesdays.         . prochlorperazine (COMPAZINE) 10 MG tablet   Oral   Take 1 tablet (10 mg total) by mouth every 6 (six) hours as needed. For nausea   30 tablet   3   . spironolactone (ALDACTONE) 25 MG tablet   Oral   Take 25 mg by mouth every other day.            BP 133/53  Pulse 79  Temp(Src) 98.5 F (36.9 C)  Resp 16  SpO2 98%  Physical Exam  Nursing note and vitals reviewed. Constitutional: She is oriented to person, place, and time. She appears well-developed and well-nourished.  Elderly, frail  HENT:  Head: Normocephalic and atraumatic.  Eyes: Conjunctivae and EOM are normal. Pupils are equal, round,  and reactive to light.  Neck: Normal range of motion and phonation normal. Neck supple.  Cardiovascular: Normal rate, regular rhythm and intact distal pulses.   Pulmonary/Chest: Effort normal and breath sounds normal. She exhibits no tenderness.  Abdominal: Soft. Bowel sounds are  normal. She exhibits distension. She exhibits no mass. There is tenderness (Diffuse, mild). There is no rebound and no guarding.  Musculoskeletal: Normal range of motion. She exhibits no edema.  Neurological: She is alert and oriented to person, place, and time. She has normal strength. No cranial nerve deficit. She exhibits normal muscle tone.  Rhythmic tongue motions consistent with dystonia  Skin: Skin is warm and dry.  Psychiatric: She has a normal mood and affect. Her behavior is normal. Judgment and thought content normal.    ED Course  Procedures (including critical care time)  Medications  0.9 %  sodium chloride infusion ( Intravenous New Bag/Given 09/05/12 1101)  sodium chloride 0.9 % bolus 500 mL (0 mLs Intravenous Stopped 09/05/12 1100)    Reevaluation: 14:25. Patient feels better. She has not had any additional stooling in the ED. Therefore, no stool samples collected. The patient feels well enough to go home. The patient's daughter is with her and can assist her, at home.  Consultation: 14:30- I discussed the case with her oncologist, Dr. Vedia Pereyra. We feel that the patient can be discharged home   Labs Reviewed  CBC WITH DIFFERENTIAL - Abnormal; Notable for the following:    WBC 3.7 (*)    RBC 2.74 (*)    Hemoglobin 9.1 (*)    HCT 25.9 (*)    RDW 16.6 (*)    Platelets 120 (*)    Monocytes Relative 15 (*)    All other components within normal limits  COMPREHENSIVE METABOLIC PANEL - Abnormal; Notable for the following:    Sodium 124 (*)    Chloride 89 (*)    BUN 27 (*)    Creatinine, Ser 1.15 (*)    Albumin 3.1 (*)    GFR calc non Af Amer 42 (*)    GFR calc Af Amer 49 (*)    All other components within normal limits  URINALYSIS, ROUTINE W REFLEX MICROSCOPIC - Abnormal; Notable for the following:    APPearance CLOUDY (*)    Hgb urine dipstick TRACE (*)    Leukocytes, UA MODERATE (*)    All other components within normal limits  URINE MICROSCOPIC-ADD ON - Abnormal;  Notable for the following:    Squamous Epithelial / LPF FEW (*)    Bacteria, UA FEW (*)    All other components within normal limits  URINE CULTURE  STOOL CULTURE  CLOSTRIDIUM DIFFICILE BY PCR  LIPASE, BLOOD   Dg Abd Acute W/chest  09/05/2012  *RADIOLOGY REPORT*  Clinical Data: Abdominal pain and swelling.  ACUTE ABDOMEN SERIES (ABDOMEN 2 VIEW & CHEST 1 VIEW)  Comparison: Chest radiograph 04/24/2012.  Chest CT 07/25/2012.  Findings: Chronic changes of the chest are noted.  CABG/median sternotomy.  Lingular scarring and / or atelectasis.  No free air underneath the hemi diaphragms.  Postprocedural changes of multilevel vertebral augmentation.  Stool and bowel gas extends to the level of the rectosigmoid.  There are colonic air fluid levels on the upright radiograph which are abnormal but nonspecific.  These are most commonly associated with enteric infection.  Calcifications over the right hip are most compatible with overlying calcified granulomas. Extensive atherosclerosis.  IMPRESSION: Nonobstructive bowel gas pattern with colonic air fluid levels. Although nonspecific,  fluid within the colon is abnormal.  This is most commonly associated with enteric infection.   Original Report Authenticated By: Andreas Newport, M.D.    Nursing Notes Reviewed/ Care Coordinated, and agree without changes. Applicable Imaging Reviewed.  Interpretation of Laboratory Data incorporated into ED treatment  1. Diarrhea   2. Dehydration       MDM  Nonspecific diarrhea with dehydration. Patient improved with treatment in the ED. During the entire ED stay, she did not have any stooling.Doubt metabolic instability, serious bacterial infection or impending vascular collapse; the patient is stable for discharge.    Plan: Home Medications- Immodium if needed (try to limit); Home Treatments- Fluids, rest; Recommended follow up- Return Stool sample for testing, when collected.       Flint Melter, MD 09/05/12  1447

## 2012-09-05 NOTE — ED Notes (Signed)
[id number]<AV> Expected date:<BR> Expected time:<BR> Means of arrival:<BR> Comments:<BR> Arty Baumgartner, from [facility]

## 2012-09-05 NOTE — Progress Notes (Signed)
Per Dr. Arline Asp, please send patient to the ER for assessment of abdominal pain. Patient has had diarrhea x 1 week. Has been taking Immodium 6 - 8 tablets/day with no relief. Dr. Arline Asp doesn't think this is chemo related. Daughter is with patient today. Diane, RN, charge nurse of ER, was notified of patient coming over. Manuela Neptune, RN

## 2012-09-05 NOTE — ED Notes (Signed)
Pt brought from [facility] by RN and daughter with reports of abdominal pain and diarrhea for 1 week that Cancer Center MD does not feel is related to chemo.

## 2012-09-06 ENCOUNTER — Encounter: Payer: Self-pay | Admitting: Oncology

## 2012-09-06 NOTE — Progress Notes (Signed)
The patient presented for chemotherapy however she was having abdominal pain and diarrhea. We did not administer chemotherapy and she was sent to the emergency room where she was evaluated and was given IV fluids.

## 2012-09-07 ENCOUNTER — Other Ambulatory Visit: Payer: Self-pay | Admitting: Oncology

## 2012-09-07 DIAGNOSIS — C9000 Multiple myeloma not having achieved remission: Secondary | ICD-10-CM

## 2012-09-07 LAB — URINE CULTURE

## 2012-09-08 ENCOUNTER — Telehealth (HOSPITAL_COMMUNITY): Payer: Self-pay | Admitting: Emergency Medicine

## 2012-09-08 NOTE — ED Notes (Signed)
Patient has +Urine culture. °

## 2012-09-08 NOTE — ED Notes (Signed)
+   Urine Chart sent to EDP office for review. 

## 2012-09-09 ENCOUNTER — Telehealth (HOSPITAL_COMMUNITY): Payer: Self-pay | Admitting: Emergency Medicine

## 2012-09-11 ENCOUNTER — Other Ambulatory Visit: Payer: Self-pay | Admitting: Oncology

## 2012-09-11 ENCOUNTER — Other Ambulatory Visit: Payer: Self-pay

## 2012-09-11 ENCOUNTER — Telehealth: Payer: Self-pay

## 2012-09-11 ENCOUNTER — Telehealth: Payer: Self-pay | Admitting: Oncology

## 2012-09-11 NOTE — Telephone Encounter (Signed)
Returning mary's call from last night. We fixed schedule so there is treatment time tomorrow at 945 am. Christina Hopkins asked about extra NS for hydration. I told her to speak with the infusion nurse tomorrow.

## 2012-09-12 ENCOUNTER — Telehealth: Payer: Self-pay

## 2012-09-12 ENCOUNTER — Other Ambulatory Visit: Payer: Self-pay | Admitting: Oncology

## 2012-09-12 ENCOUNTER — Ambulatory Visit (HOSPITAL_BASED_OUTPATIENT_CLINIC_OR_DEPARTMENT_OTHER): Payer: Medicare Other

## 2012-09-12 ENCOUNTER — Other Ambulatory Visit: Payer: Medicare Other

## 2012-09-12 ENCOUNTER — Telehealth (HOSPITAL_COMMUNITY): Payer: Self-pay | Admitting: *Deleted

## 2012-09-12 ENCOUNTER — Other Ambulatory Visit (HOSPITAL_BASED_OUTPATIENT_CLINIC_OR_DEPARTMENT_OTHER): Payer: Medicare Other | Admitting: Lab

## 2012-09-12 VITALS — BP 120/58 | HR 68 | Temp 98.6°F | Wt 106.0 lb

## 2012-09-12 DIAGNOSIS — Z5111 Encounter for antineoplastic chemotherapy: Secondary | ICD-10-CM

## 2012-09-12 DIAGNOSIS — D649 Anemia, unspecified: Secondary | ICD-10-CM

## 2012-09-12 DIAGNOSIS — I2510 Atherosclerotic heart disease of native coronary artery without angina pectoris: Secondary | ICD-10-CM

## 2012-09-12 DIAGNOSIS — E538 Deficiency of other specified B group vitamins: Secondary | ICD-10-CM

## 2012-09-12 DIAGNOSIS — Z5112 Encounter for antineoplastic immunotherapy: Secondary | ICD-10-CM

## 2012-09-12 DIAGNOSIS — D509 Iron deficiency anemia, unspecified: Secondary | ICD-10-CM

## 2012-09-12 DIAGNOSIS — E612 Magnesium deficiency: Secondary | ICD-10-CM

## 2012-09-12 DIAGNOSIS — C9000 Multiple myeloma not having achieved remission: Secondary | ICD-10-CM

## 2012-09-12 DIAGNOSIS — E785 Hyperlipidemia, unspecified: Secondary | ICD-10-CM

## 2012-09-12 DIAGNOSIS — D519 Vitamin B12 deficiency anemia, unspecified: Secondary | ICD-10-CM

## 2012-09-12 DIAGNOSIS — I10 Essential (primary) hypertension: Secondary | ICD-10-CM

## 2012-09-12 LAB — CBC WITH DIFFERENTIAL/PLATELET
BASO%: 0.4 % (ref 0.0–2.0)
LYMPH%: 17.2 % (ref 14.0–49.7)
MCHC: 33.8 g/dL (ref 31.5–36.0)
MCV: 100 fL (ref 79.5–101.0)
MONO#: 0.4 10*3/uL (ref 0.1–0.9)
MONO%: 10.9 % (ref 0.0–14.0)
Platelets: 196 10*3/uL (ref 145–400)
RBC: 2.86 10*6/uL — ABNORMAL LOW (ref 3.70–5.45)
RDW: 18.5 % — ABNORMAL HIGH (ref 11.2–14.5)
WBC: 3.4 10*3/uL — ABNORMAL LOW (ref 3.9–10.3)

## 2012-09-12 MED ORDER — BORTEZOMIB CHEMO SQ INJECTION 3.5 MG (2.5MG/ML)
1.3000 mg/m2 | Freq: Once | INTRAMUSCULAR | Status: AC
  Administered 2012-09-12: 2 mg via SUBCUTANEOUS
  Filled 2012-09-12: qty 2

## 2012-09-12 MED ORDER — ONDANSETRON 8 MG/50ML IVPB (CHCC)
8.0000 mg | Freq: Once | INTRAVENOUS | Status: AC
  Administered 2012-09-12: 8 mg via INTRAVENOUS

## 2012-09-12 MED ORDER — CYANOCOBALAMIN 1000 MCG/ML IJ SOLN
1000.0000 ug | Freq: Once | INTRAMUSCULAR | Status: AC
  Administered 2012-09-12: 1000 ug via INTRAMUSCULAR

## 2012-09-12 MED ORDER — DEXAMETHASONE SODIUM PHOSPHATE 4 MG/ML IJ SOLN
20.0000 mg | Freq: Once | INTRAMUSCULAR | Status: AC
  Administered 2012-09-12: 20 mg via INTRAVENOUS

## 2012-09-12 MED ORDER — SODIUM CHLORIDE 0.9 % IV SOLN
400.0000 mg | Freq: Once | INTRAVENOUS | Status: AC
  Administered 2012-09-12: 400 mg via INTRAVENOUS
  Filled 2012-09-12: qty 20

## 2012-09-12 MED ORDER — DARBEPOETIN ALFA-POLYSORBATE 300 MCG/0.6ML IJ SOLN
300.0000 ug | Freq: Once | INTRAMUSCULAR | Status: AC
  Administered 2012-09-12: 300 ug via SUBCUTANEOUS
  Filled 2012-09-12: qty 0.6

## 2012-09-12 NOTE — ED Notes (Signed)
Medication called into [facility], [phone], for Keflex 500mg  1 tab qid x7 for 28 tabs per Dr. Effie Shy

## 2012-09-12 NOTE — ED Notes (Signed)
LVM message for patient to return call @ 1729.

## 2012-09-12 NOTE — Patient Instructions (Signed)
[facility] Discharge Instructions for Patients Receiving Chemotherapy  Today you received the following chemotherapy agents Cytoxan/Velcade/B12/Aranesp  To help prevent nausea and vomiting after your treatment, we encourage you to take your nausea medication as prescribed.  If you develop nausea and vomiting that is not controlled by your nausea medication, call the clinic. If it is after clinic hours your family physician or the after hours number for the clinic or go to the Emergency Department.   BELOW ARE SYMPTOMS THAT SHOULD BE REPORTED IMMEDIATELY:  *FEVER GREATER THAN 100.5 F  *CHILLS WITH OR WITHOUT FEVER  NAUSEA AND VOMITING THAT IS NOT CONTROLLED WITH YOUR NAUSEA MEDICATION  *UNUSUAL SHORTNESS OF BREATH  *UNUSUAL BRUISING OR BLEEDING  TENDERNESS IN MOUTH AND THROAT WITH OR WITHOUT PRESENCE OF ULCERS  *URINARY PROBLEMS  *BOWEL PROBLEMS  UNUSUAL RASH Items with * indicate a potential emergency and should be followed up as soon as possible.  One of the nurses will contact you 24 hours after your treatment. Please let the nurse know about any problems that you may have experienced. Feel free to call the clinic you have any questions or concerns. The clinic phone number is [phone].   I have been informed and understand all the instructions given to me. I know to contact the clinic, my physician, or go to the Emergency Department if any problems should occur. I do not have any questions at this time, but understand that I may call the clinic during office hours   should I have any questions or need assistance in obtaining follow up care.    __________________________________________  _____________  __________ Signature of Patient or Authorized Representative            Date                   Time    __________________________________________ Nurse's Signature

## 2012-09-12 NOTE — Progress Notes (Signed)
Ok to give 250 mL of Normal saline after patient's treatment.

## 2012-09-12 NOTE — Telephone Encounter (Signed)
Faxed a note for Christina Hopkins accompanying her mother to [facility] today. For her FMLA.

## 2012-09-13 LAB — KAPPA/LAMBDA LIGHT CHAINS
Kappa free light chain: 16.2 mg/dL — ABNORMAL HIGH (ref 0.33–1.94)
Lambda Free Lght Chn: 0.99 mg/dL (ref 0.57–2.63)

## 2012-09-13 LAB — IGA: IgA: 1270 mg/dL — ABNORMAL HIGH (ref 69–380)

## 2012-09-18 ENCOUNTER — Telehealth: Payer: Self-pay

## 2012-09-18 NOTE — Telephone Encounter (Signed)
Christina Hopkins called stating the pt will keep her MD appt 5/8 but cancelled the chemotherapy. Pt has her sister's 90th birthday party to go to that day, and she has not seen her sister in many months. This message forwarded to Dr Arline Asp.

## 2012-09-19 ENCOUNTER — Ambulatory Visit: Payer: Medicare Other

## 2012-09-20 ENCOUNTER — Ambulatory Visit: Payer: Medicare Other

## 2012-09-20 ENCOUNTER — Ambulatory Visit (HOSPITAL_BASED_OUTPATIENT_CLINIC_OR_DEPARTMENT_OTHER): Payer: Medicare Other | Admitting: Oncology

## 2012-09-20 ENCOUNTER — Other Ambulatory Visit (HOSPITAL_BASED_OUTPATIENT_CLINIC_OR_DEPARTMENT_OTHER): Payer: Medicare Other | Admitting: Lab

## 2012-09-20 ENCOUNTER — Telehealth: Payer: Self-pay | Admitting: Oncology

## 2012-09-20 ENCOUNTER — Telehealth: Payer: Self-pay | Admitting: Neurology

## 2012-09-20 ENCOUNTER — Telehealth: Payer: Self-pay

## 2012-09-20 ENCOUNTER — Encounter: Payer: Self-pay | Admitting: Oncology

## 2012-09-20 VITALS — BP 110/53 | HR 64 | Temp 98.2°F | Resp 17 | Ht <= 58 in | Wt 103.1 lb

## 2012-09-20 DIAGNOSIS — C9000 Multiple myeloma not having achieved remission: Secondary | ICD-10-CM

## 2012-09-20 DIAGNOSIS — R197 Diarrhea, unspecified: Secondary | ICD-10-CM

## 2012-09-20 DIAGNOSIS — D649 Anemia, unspecified: Secondary | ICD-10-CM

## 2012-09-20 LAB — CBC WITH DIFFERENTIAL/PLATELET
BASO%: 0.2 % (ref 0.0–2.0)
EOS%: 0.7 % (ref 0.0–7.0)
HCT: 28.5 % — ABNORMAL LOW (ref 34.8–46.6)
LYMPH%: 19.4 % (ref 14.0–49.7)
MCH: 33.8 pg (ref 25.1–34.0)
MCHC: 33.8 g/dL (ref 31.5–36.0)
MONO%: 10.7 % (ref 0.0–14.0)
NEUT%: 69 % (ref 38.4–76.8)
Platelets: 120 10*3/uL — ABNORMAL LOW (ref 145–400)

## 2012-09-20 LAB — COMPREHENSIVE METABOLIC PANEL (CC13)
ALT: 12 U/L (ref 0–55)
BUN: 8.7 mg/dL (ref 7.0–26.0)
CO2: 29 mEq/L (ref 22–29)
Calcium: 8.7 mg/dL (ref 8.4–10.4)
Chloride: 95 mEq/L — ABNORMAL LOW (ref 98–107)
Creatinine: 0.8 mg/dL (ref 0.6–1.1)
Total Bilirubin: 0.63 mg/dL (ref 0.20–1.20)

## 2012-09-20 LAB — LACTATE DEHYDROGENASE (CC13): LDH: 141 U/L (ref 125–245)

## 2012-09-20 NOTE — Progress Notes (Signed)
This office note has been dictated.  #[id number]

## 2012-09-20 NOTE — Telephone Encounter (Signed)
gv and printed appt sched and avs for pt for May.Marland KitchenMarland KitchenTanna Hopkins to let me know available time spot for 6.12.14...MB added tx for May

## 2012-09-20 NOTE — Telephone Encounter (Signed)
Written excuse for Christina Hopkins accompanying her mother faxed to [facility] work. Christina Hopkins had asked about megace to stimulate appetite. Dr Arline Asp stated megace increases risk for blood clot in already compromised pt and he would rather not use it. If later she needs appetite stimulant he would consider prednisone. Christina Hopkins understood and said she would just wait and see how pt does.

## 2012-09-21 ENCOUNTER — Telehealth: Payer: Self-pay | Admitting: Oncology

## 2012-09-21 ENCOUNTER — Telehealth: Payer: Self-pay

## 2012-09-21 LAB — KAPPA/LAMBDA LIGHT CHAINS: Kappa free light chain: 14.1 mg/dL — ABNORMAL HIGH (ref 0.33–1.94)

## 2012-09-21 LAB — IGA: IgA: 928 mg/dL — ABNORMAL HIGH (ref 69–380)

## 2012-09-21 NOTE — Progress Notes (Signed)
CC:   [name], M.D. [name]. [name], M.D. [name], M.D.  PROBLEM LIST:  1. Multiple myeloma, initially presenting as an IgA kappa monoclonal  gammopathy in February 2009. 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 This was felt to be an adverse prognostic  determinant. Bone marrow on 08/31/2007 showed 46% plasma cells. A  repeat bone marrow on 08/05/2009 showed 73% plasma cells.  Treatment with Revlimid was started in April 2011. Zometa was  started in October 2011 and 2 years of treatment were concluded  on 02/23/2012. We had previously started Aranesp for the  patient's anemia in May of 2010. Most recent metastatic bone  survey was on 08/05/2009. Other x-rays since then are available.  Maximum IgA level was 2080 on 08/11/2009.   Subcutaneous Velcade was added to the patient's treatment program on 06/14/2011 because of a rising IgA level 1080 on 06/02/2011. The patient had been receiving Velcade every 2 weeks in combination with Revlimid during the early months of 2014.  The patient continues to demonstrate progressive disease as evidenced by rising IgA and serum kappa levels.  Revlimid was discontinued in April 2014.  The patient had received Revlimid for about 3 years from April 2011 through April 2014.  As of 08/29/2012, the patient is now receiving Velcade, Cytoxan and Decadron every 2 weeks. The patient has also been receiving Aranesp 300 mcg subcu every 2 weeks for hemoglobin less than or equal to 10.   2. Anemia secondary to vitamin B12 deficiency.  The patient had been on oral vitamin B12. However, her vitamin B12  level fell to 284 on 07/26/2011 and vitamin B12 shots 1000 mcg  given IM every month was resumed on 09/06/2011.   3. Anemia secondary to iron deficiency.  4. Vitamin D deficiency.  5. Magnesium deficiency.  6. Diabetes mellitus.  7. Hypertension.  8. Dyslipidemia.  9. Coronary artery disease.  10. Right anterior thigh mass, most likely  a lipoma.  11. Multiple vertebral compression fractures as seen  on MRI of the lumbar spine on 08/27/2011. Kyphoplasty was carried out at L5, T11 and T12 on 10/07/2011 by Dr. Julieanne Cotton.  12. Left vocal cord paralysis, complete, noted 05/06/2012. The patient underwent a Radiesse vocal cord augmentation on 08/13/2012 by Dr. Flo Shanks. 13. Tardive dyskinesia noted in March 2014.  The patient had been on     Remeron which was discontinued.   MEDICATIONS:  Reviewed and recorded. Current Outpatient Prescriptions  Medication Sig Dispense Refill  . acetaminophen (TYLENOL) 650 MG CR tablet Take 650 mg by mouth every 8 (eight) hours as needed for pain.      Marland Kitchen acyclovir (ZOVIRAX) 400 MG tablet Take 1 tablet (400 mg total) by mouth 2 (two) times daily.  60 tablet  3  . Alum & Mag Hydroxide-Simeth (MAGIC MOUTHWASH) SOLN Swish and spit 5 mLs 4 (four) times daily as needed (for mouth ulcers).       Marland Kitchen aspirin EC 81 MG tablet Take 81 mg by mouth daily.      Marland Kitchen atorvastatin (LIPITOR) 40 MG tablet Take 40 mg by mouth daily.       . carvedilol (COREG) 25 MG tablet Take 25 mg by mouth 2 (two) times daily with a meal.      . Cholecalciferol (VITAMIN D) 2000 UNITS tablet Take 2,000 Units by mouth at bedtime.      . Cholecalciferol (VITAMIN D-3) 5000 UNITS TABS Take 1 tablet by mouth at bedtime.      Marland Kitchen  cyanocobalamin (,VITAMIN B-12,) 1000 MCG/ML injection Inject 1,000 mcg into the muscle every 30 (thirty) days.      . cyclobenzaprine (FLEXERIL) 10 MG tablet Take 10 mg by mouth 3 (three) times daily as needed. For muscle spasm      . darbepoetin (ARANESP) 300 MCG/0.6ML SOLN Inject 300 mcg into the skin as needed (given if needed for anemia).      Marland Kitchen HYDROcodone-acetaminophen (NORCO) 10-325 MG per tablet Take 1 tablet by mouth every 6 (six) hours as needed for pain.      Marland Kitchen lisinopril (PRINIVIL,ZESTRIL) 2.5 MG tablet Take 1 tablet (2.5 mg total) by mouth daily.  30 tablet  0  . metFORMIN (GLUCOPHAGE) 500 MG  tablet Take 500 mg by mouth 2 (two) times daily with a meal.        . Multiple Vitamin (MULTIVITAMIN WITH MINERALS) TABS Take 1 tablet by mouth daily.      . pantoprazole (PROTONIX) 40 MG tablet Take 40 mg by mouth daily.        Bertram Gala Glycol-Propyl Glycol (SYSTANE OP) Place 2 drops into both eyes at bedtime.       Marland Kitchen PRESCRIPTION MEDICATION Inject into the vein every 7 (seven) days. Velcade and Cytoxan infusion q7d on Wednesdays.      . prochlorperazine (COMPAZINE) 10 MG tablet Take 1 tablet (10 mg total) by mouth every 6 (six) hours as needed. For nausea  30 tablet  3  . spironolactone (ALDACTONE) 25 MG tablet Take 25 mg by mouth every other day.        No current facility-administered medications for this visit.   Facility-Administered Medications Ordered in Other Visits  Medication Dose Route Frequency Provider Last Rate Last Dose  . darbepoetin (ARANESP) injection 300 mcg  300 mcg Subcutaneous Once Samul Dada, MD         TREATMENT PROGRAM: 1. Velcade 2 mg subcutaneous every 2 weeks.  Velcade was started on     06/14/2011. 2. Cytoxan 400 mg IV started on 08/29/2012. 3. Decadron 20 mg IV started on 08/29/2012. 4. Aranesp 300 mcg subcu every 2 weeks for hemoglobin less than or     equal to 10. 5. Vitamin B12 1000 mcg IM monthly restarted on 09/06/2011. 6. The patient received Zometa 3.5 mg IV from 02/14/2010 through     02/23/2012.   IMMUNIZATIONS:  1. Pneumovax was apparently given about 9 years ago when the patient  was 79 years old.  2. Flu shot was given on 01/27/2012.    SMOKING HISTORY: The patient has never smoked cigarettes.   HISTORY:  I saw [name] today for followup of her IgA kappa multiple myeloma which in recent months has been associated with her rising IgA and serum kappa levels.  Christina Hopkins was accompanied by her daughter, [name].  She was last seen by [place] on 08/22/2012 and on 07/19/2012.  At that time the patient had been receiving Revlimid  10 mg 1 week on 1 week off in conjunction with Velcade 2 mg subcu every 2 weeks.  As stated, the patient's protein levels were rising although she remained asymptomatic from her multiple myeloma.  Decision was made to stopped the Revlimid and instead utilize Cytoxan in conjunction with Decadron added to the Velcade.  The patient received her first course of this new chemotherapy on 08/29/2012.  Within a couple of days the patient had abdominal pain, diarrhea and some nausea. She ended up going to the emergency room where she  had abdominal x-rays. There was a nonobstructive bowel gas pattern with colonic air fluid levels.  It was felt that the patient might have an enteric infection. She was given IV fluids.  The symptoms abated.  The patient received her next course of chemotherapy on 04/30.  She again had some slight nausea and some mild diarrhea.  She did not require any medical intervention. The patient has noted some fatigue for several days following her treatments.  On 04/30 she also received Aranesp 300 mcg subcu and her monthly vitamin B12 shot.  Currently the patient seems to be doing a little better.  She still complains of some fatigue.  She is having normal bowel movements.  The patient's vocal cord problem has improved following surgery by Dr. [name] on 08/13/2012.  He carried out a Radiesse vocal cord augmentation on 08/13/2012.  The patient's swallowing is also improved. She continues to have back pain located around the lower thoracic spine region.  She will take 1-3 Vicodin a day.  She says overall the back pain may be a little bit better.  PHYSICAL EXAM:  The patient is in no acute distress.  She has lost a few more pounds, now 103 pounds 1.6 ounces.  Height 4 feet 9 inches.  Blood pressure 110/53.  Other vital signs are normal.  Voice is much improved but still a little bit hoarse.  No scleral icterus.  Mouth and pharynx are benign.  There was no peripheral  adenopathy palpable.  Heart and lungs are normal.  The patient does not have a Port-A-Cath or central catheter.  Abdomen with the patient sitting is benign.  Extremities, no peripheral edema or clubbing.  The patient has a lipoma involving the right lower proximal thigh.  Neurologic exam is nonfocal.  The patient does seem to have tardive dyskinesia.  She has stopped Remeron.  LABORATORY DATA:  Today, white count 2.5, ANC 1.7, hemoglobin 9.6, hematocrit 28.5, platelets 120,000.  Chemistries today notable for an albumin of 3.0, normal renal function and LDH.  Total protein was 6.5, down from 7.5 on 08/22/2012.  IgA level on 08/22/2012 had gotten up to 1480 but was down to 1270 on 09/12/2012.  Kappa free light chains on 09/12/2012 were 16.20.  It will be recalled that the patient received her first course of new chemotherapy program on 08/29/2012.  IMAGING STUDIES:  1. Most recent metastatic bone survey was on 08/05/2009. Prior to  that, we had a metastatic bone survey from 11/03/2008 and  07/23/2007. The metastatic bone survey from 08/05/2009 stated that  there were no findings to strongly suggest metastatic disease.  There were multiple sites of degenerative change.  2. Chest x-ray from 02/09/2010 showed new or increased T6 and T7  compression fractures compared with the skeletal survey of  08/05/2009.  3. CT scan of head without IV contrast showed no acute findings. The  calvarium was intact. There was a probable old lacunar infarct in  the right basal ganglia.  4. There are x-rays of the left hip and lumbar spine from 07/16/2010.  There are x-rays of the left humerus and cervical spine from  06/30/2010.  5. An ultrasound of the right lower extremity was carried out on 08/18/2010  and did not show a discrete mass in the right thigh.  6. Screening mammogram from 02/01/2011 carried out at [facility] was negative.  7. MRI of the lumbar spine with and without IV contrast on  08/27/2011  showed acute/subacute superior endplate  fracture of L5 with minimal  loss of vertebral body height and no significant retropulsion.  There was a nonhealed superior endplate compression fracture at T11  and T12 with associated kyphosis. There was central canal stenosis  greatest at L4-5, foraminal stenosis greatest at L2-3 and L3-4, and  nonhealed sacral insufficiency fractures. There was multilevel  spondylosis of the lumbar spine.  8. Kyphoplasty at L5, T11 and T12 carried out on 10/07/2011 by Dr.  Julieanne Cotton was carried out successfully. Biopsies showed  blood clot and hypercellular marrow with increased plasma cell  infiltrates at L5, T11 and T12.  9. Chest x-ray, 2 view, from 05/04/2012 showed enlargement of the  cardiac silhouette and evidence of CABG. There were chronic  bronchitic changes with left basilar scarring. Osteoporosis with  multiple thoracic spine compression fractures was present. There  were no acute abnormalities.  10. X-rays of thoracic spine, 2 view, from 05/04/2012 showed evidence  of mid T3 and L1 compression fractures which are new since 2011 and  April 2013 respectively. There was interval augmentation of T11  and T12 fractures as well as chronic T6 and T7 compression  fractures.  11. CT of the head without IV contrast on 05/04/2012 showed no acute  intracranial abnormalities and no significant changes compared with  the head CT from 07/16/2010.  12. MRI of the thoracic spine without IV contrast on 05/05/2012  confirmed the acute/subacute inferior endplate compression fracture  of C3 with approximately 25% loss of height. The remote inferior  endplate fractures at T6 and T7 demonstrate some residual edema  suggesting these are incompletely healed or have continued to  fracture. There was a superior endplate compression fracture at  T10 with minimal loss of height. There was evidence of vertebral  augmentation at T11 and T12 without  residual edema. There was mild  spondylosis of the thoracic spine and small bilateral pleural  effusions with associated atelectasis.  13. Swallowing evaluation on 06/04/2012 showed severe oral phase  dysphagia, severe pharyngeal phase dysphagia and moderate cervical  esophageal phase dysphagia. Aspiration of secretions and liquids  were noted.  14. CT scan of the neck with IV contrast on 07/12/2012 showed lateral  deviation of the left vocal cord compatible with paralysis. There  was no focal lesion along the course of the recurrent laryngeal  nerve to explain the finding. There was no primary mucosal  neoplasm or evidence of metastatic disease. There was extensive  atherosclerotic change without significant stenosis. There was  moderate spondylosis of the cervical spine. There was an 8 mm  obstructing stone in the proximal right submandibular duct with  chronic atrophy of the right submandibular gland.  15. An MRI of the brain without IV contrast was carried out at  [facility]. We will try to obtain a copy of that report. 16. An MRI of the brain without IV contrast was carried out on 07/15/2012 at [facility].  We are trying to obtain a copy of that report. 17. CT scan of the chest with IV contrast on 07/25/2012 showed no mediastinal mass or other etiology to explain the presence of the patient's left vocal cord paralysis.  There has been slight interval progression of T10 superior endplate compression deformity. 18. Acute abdomen series (abdomen 2 view and chest 1 view) on 09/05/2012 showed nonobstructive bowel gas pattern with colonic air fluid levels. Although nonspecific, fluid within the colon is abnormal.  This is most commonly associated with enteric infection.   IMPRESSION AND PLAN:  As noted above, Ms. Dassow was on a new chemotherapy program.  We decided to stopped the Revlimid given her rising protein levels in the serum.  We have added IV Cytoxan  and Decadron to subcutaneous Velcade,  Our initial intent was to treat the patient weekly, however, she is having some fatigue and perhaps other symptoms as noted above so that we will now plan to treat her every 2 weeks.  The patient's last treatment was on 09/12/2012.  At that time she received Aranesp 300 mcg subcu and vitamin B12 1000 mcg IM.  We will not treat the patient today.  We have set her up for chemotherapy on May 15, at which time she will have a CBC.  She may also be due for Aranesp at that time if her hemoglobin is less than or equal to 10.  The patient will have a CBC and an IgA level on May 29, also another dose of chemotherapy.  We would like to see Ms. Szeliga around June 12, at which time she will again be due for chemotherapy and possibly more Aranesp. We will check CBC, chemistries, IgA level and serum light chains.  I have suggested that the patient try to take some Imodium, perhaps 2 pills on the day of treatment and the day after to see if that will decrease her diarrhea.  I should mention that the patient previously had been on Remeron which we stopped because of the development of tardive dyskinesia.  The patient also had stopped magnesium as her recent magnesium levels have been in the normal range.  In looking the patient's chart over, I am not sure whether we have ever discussed living will or related issues.  We should probably address this at the patient's next visit.  At this point it is to early to say whether the new chemotherapy program is working, however, I am encouraged by the decrease in the serum protein level and the mild decrease in the IgA level.    ______________________________ Samul Dada, M.D. DSM/MEDQ  D:  09/20/2012  T:  09/21/2012  Job:  [id number]

## 2012-09-21 NOTE — Telephone Encounter (Signed)
lvm that Dr Arline Asp is requesting the interpretive report of the MR brain done on 07/15/12.

## 2012-09-21 NOTE — Telephone Encounter (Signed)
s.w. pt and advised on 6.12.14 appt....pt ok and aware °

## 2012-09-24 ENCOUNTER — Telehealth: Payer: Self-pay | Admitting: Dietician

## 2012-09-24 NOTE — Telephone Encounter (Signed)
Brief Outpatient Oncology Nutrition Note  Patient has been identified to be at risk on malnutrition screen.  Wt Readings from Last 10 Encounters:  09/20/12 103 lb 1.6 oz (46.766 kg)  09/12/12 106 lb (48.081 kg)  08/22/12 105 lb 11.2 oz (47.945 kg)  08/13/12 108 lb 3.2 oz (49.079 kg)  08/13/12 108 lb 3.2 oz (49.079 kg)  07/19/12 109 lb 14.4 oz (49.85 kg)  06/14/12 109 lb 4.8 oz (49.578 kg)  05/04/12 120 lb 5.9 oz (54.6 kg)  04/19/12 113 lb (51.256 kg)  02/23/12 111 lb 9.6 oz (50.621 kg)     Patient with a 5% weight loss in the past 3 1/2 months.  Called and spoke with patient who reports trying to gain weight but with a poor appetite.  Drinking Boost or Ensure shake daily and trying to eat regularly.  Continues with problems with diarrhea.    Discussed regular meals/ snacks and continued use of Ensure and Boost.  Will mail patient coupons for Ensure and handouts- "Suggestions for Increasing Calories and Protein" and "Diarrhea".  Oran Rein, RD, LDN

## 2012-09-27 ENCOUNTER — Other Ambulatory Visit (HOSPITAL_BASED_OUTPATIENT_CLINIC_OR_DEPARTMENT_OTHER): Payer: Medicare Other | Admitting: Lab

## 2012-09-27 ENCOUNTER — Telehealth: Payer: Self-pay | Admitting: *Deleted

## 2012-09-27 ENCOUNTER — Other Ambulatory Visit: Payer: Self-pay | Admitting: Oncology

## 2012-09-27 ENCOUNTER — Ambulatory Visit: Payer: Medicare Other

## 2012-09-27 ENCOUNTER — Telehealth: Payer: Self-pay | Admitting: Oncology

## 2012-09-27 ENCOUNTER — Encounter: Payer: Self-pay | Admitting: Oncology

## 2012-09-27 DIAGNOSIS — C9000 Multiple myeloma not having achieved remission: Secondary | ICD-10-CM

## 2012-09-27 LAB — CBC WITH DIFFERENTIAL/PLATELET
BASO%: 1.3 % (ref 0.0–2.0)
Eosinophils Absolute: 0 10*3/uL (ref 0.0–0.5)
LYMPH%: 29.8 % (ref 14.0–49.7)
MCHC: 31.8 g/dL (ref 31.5–36.0)
MONO#: 0.4 10*3/uL (ref 0.1–0.9)
NEUT#: 1.2 10*3/uL — ABNORMAL LOW (ref 1.5–6.5)
Platelets: 155 10*3/uL (ref 145–400)
RBC: 3.29 10*6/uL — ABNORMAL LOW (ref 3.70–5.45)
WBC: 2.4 10*3/uL — ABNORMAL LOW (ref 3.9–10.3)
lymph#: 0.7 10*3/uL — ABNORMAL LOW (ref 0.9–3.3)
nRBC: 0 % (ref 0–0)

## 2012-09-27 NOTE — Telephone Encounter (Signed)
Pt came by and wants to r/s chemo for today to 5/22 due to lab result.

## 2012-09-27 NOTE — Telephone Encounter (Signed)
Per staff phone call and POF I have schedueld appts.  [name]  

## 2012-09-27 NOTE — Progress Notes (Signed)
Pt in for chemo today. Physician reviewed labs. Chemo not given today. Pt to make an apt for chemo next week at scheduling before she leaves clinic today. Pt voices understanding. Pt also notified that clinic would call with a follow apt with Dr Arline Asp.  Pt discharged alert and via wheelchair with daughter at side.

## 2012-09-27 NOTE — Progress Notes (Signed)
We received copies of both the MRI and MRA of the brain from [facility], dated 07/15/2012.  The MRA of the head showed no significant stenosis of the medium to large-sized intracranial vessels. Persistent bilateral fetal origin of both posterior cerebral arteries was noted and is a benign variant.  The MRI of the brain shows mild changes of age appropriate chronic microvascular ischemia.

## 2012-09-27 NOTE — Telephone Encounter (Signed)
Gave pt appt for lab , chemo and MD for May and June 2014

## 2012-09-27 NOTE — Progress Notes (Signed)
Chemotherapy planned for today was held because of an ANC of 1.2. Chemotherapy will be postponed 1 week until 10/04/2012.  Most recent chemotherapy was administered on 09/12/2012. ANC on that date was 2.4.

## 2012-09-28 ENCOUNTER — Other Ambulatory Visit: Payer: Self-pay | Admitting: Medical Oncology

## 2012-09-28 ENCOUNTER — Telehealth: Payer: Self-pay | Admitting: Oncology

## 2012-09-28 NOTE — Telephone Encounter (Signed)
, °

## 2012-10-04 ENCOUNTER — Other Ambulatory Visit (HOSPITAL_BASED_OUTPATIENT_CLINIC_OR_DEPARTMENT_OTHER): Payer: Medicare Other | Admitting: Lab

## 2012-10-04 ENCOUNTER — Ambulatory Visit (HOSPITAL_BASED_OUTPATIENT_CLINIC_OR_DEPARTMENT_OTHER): Payer: Medicare Other

## 2012-10-04 VITALS — BP 112/69 | HR 77 | Temp 97.9°F

## 2012-10-04 DIAGNOSIS — C9000 Multiple myeloma not having achieved remission: Secondary | ICD-10-CM

## 2012-10-04 DIAGNOSIS — Z5112 Encounter for antineoplastic immunotherapy: Secondary | ICD-10-CM

## 2012-10-04 LAB — CBC WITH DIFFERENTIAL/PLATELET
Basophils Absolute: 0 10*3/uL (ref 0.0–0.1)
Eosinophils Absolute: 0 10*3/uL (ref 0.0–0.5)
HCT: 31.5 % — ABNORMAL LOW (ref 34.8–46.6)
HGB: 10.4 g/dL — ABNORMAL LOW (ref 11.6–15.9)
MCH: 33.5 pg (ref 25.1–34.0)
MONO#: 0.4 10*3/uL (ref 0.1–0.9)
NEUT#: 1.7 10*3/uL (ref 1.5–6.5)
NEUT%: 59.6 % (ref 38.4–76.8)
RDW: 17.5 % — ABNORMAL HIGH (ref 11.2–14.5)
WBC: 2.9 10*3/uL — ABNORMAL LOW (ref 3.9–10.3)
lymph#: 0.8 10*3/uL — ABNORMAL LOW (ref 0.9–3.3)

## 2012-10-04 MED ORDER — ONDANSETRON 8 MG/50ML IVPB (CHCC)
8.0000 mg | Freq: Once | INTRAVENOUS | Status: AC
  Administered 2012-10-04: 8 mg via INTRAVENOUS

## 2012-10-04 MED ORDER — SODIUM CHLORIDE 0.9 % IV SOLN
400.0000 mg | Freq: Once | INTRAVENOUS | Status: AC
  Administered 2012-10-04: 400 mg via INTRAVENOUS
  Filled 2012-10-04: qty 20

## 2012-10-04 MED ORDER — BORTEZOMIB CHEMO SQ INJECTION 3.5 MG (2.5MG/ML)
1.3000 mg/m2 | Freq: Once | INTRAMUSCULAR | Status: AC
  Administered 2012-10-04: 2 mg via SUBCUTANEOUS
  Filled 2012-10-04: qty 2

## 2012-10-04 MED ORDER — DEXAMETHASONE SODIUM PHOSPHATE 20 MG/5ML IJ SOLN
20.0000 mg | Freq: Once | INTRAMUSCULAR | Status: AC
  Administered 2012-10-04: 20 mg via INTRAVENOUS

## 2012-10-04 NOTE — Patient Instructions (Addendum)
[facility] Discharge Instructions for Patients Receiving Chemotherapy  Today you received the following chemotherapy agents Velcade, Cytoxan  To help prevent nausea and vomiting after your treatment, we encourage you to take your nausea medication as prescribed. Begin taking it as directed and take it as often as prescribed for the next 48-72 hours.   If you develop nausea and vomiting that is not controlled by your nausea medication, call the clinic. If it is after clinic hours your family physician or the after hours number for the clinic or go to the Emergency Department.   BELOW ARE SYMPTOMS THAT SHOULD BE REPORTED IMMEDIATELY:  *FEVER GREATER THAN 100.5 F  *CHILLS WITH OR WITHOUT FEVER  NAUSEA AND VOMITING THAT IS NOT CONTROLLED WITH YOUR NAUSEA MEDICATION  *UNUSUAL SHORTNESS OF BREATH  *UNUSUAL BRUISING OR BLEEDING  TENDERNESS IN MOUTH AND THROAT WITH OR WITHOUT PRESENCE OF ULCERS  *URINARY PROBLEMS  *BOWEL PROBLEMS  UNUSUAL RASH Items with * indicate a potential emergency and should be followed up as soon as possible.  One of the nurses will contact you 24 hours after your treatment. Please let the nurse know about any problems that you may have experienced. Feel free to call the clinic you have any questions or concerns. The clinic phone number is [phone].   I have been informed and understand all the instructions given to me. I know to contact the clinic, my physician, or go to the Emergency Department if any problems should occur. I do not have any questions at this time, but understand that I may call the clinic during office hours   should I have any questions or need assistance in obtaining follow up care.    __________________________________________  _____________  __________ Signature of Patient or Authorized Representative            Date                   Time    __________________________________________ Nurse's Signature

## 2012-10-05 ENCOUNTER — Other Ambulatory Visit: Payer: Self-pay | Admitting: *Deleted

## 2012-10-05 ENCOUNTER — Other Ambulatory Visit: Payer: Self-pay | Admitting: Certified Registered Nurse Anesthetist

## 2012-10-05 DIAGNOSIS — C9000 Multiple myeloma not having achieved remission: Secondary | ICD-10-CM

## 2012-10-05 MED ORDER — CYCLOBENZAPRINE HCL 10 MG PO TABS: 10.0000 mg | ORAL_TABLET | Freq: Three times a day (TID) | ORAL | Status: DC | PRN

## 2012-10-05 MED ORDER — ACYCLOVIR 400 MG PO TABS: 400.0000 mg | ORAL_TABLET | Freq: Two times a day (BID) | ORAL | Status: DC

## 2012-10-10 ENCOUNTER — Encounter: Payer: Self-pay | Admitting: Oncology

## 2012-10-10 NOTE — Progress Notes (Signed)
Christina Hopkins the patient's daughter left a message to call about discount. I called her back(ok to speak with her) and advised her must call each time she gets a bill to get discount taken. It is good thru 03/2013.

## 2012-10-11 ENCOUNTER — Ambulatory Visit: Payer: Medicare Other

## 2012-10-11 ENCOUNTER — Other Ambulatory Visit: Payer: Medicare Other | Admitting: Lab

## 2012-10-11 ENCOUNTER — Telehealth: Payer: Self-pay | Admitting: Oncology

## 2012-10-11 NOTE — Telephone Encounter (Signed)
Per desk nurse moved 6/4 f/u appt to 6/5 w/lb and tx. Per dtr pt can't come 2 days. Moved all appts lb/DM/tx to 6/5. S/w pt today re cx 6/4 and gv new appt for 6/5 @ 10:30am.

## 2012-10-17 ENCOUNTER — Ambulatory Visit: Payer: Medicare Other | Admitting: Oncology

## 2012-10-18 ENCOUNTER — Encounter: Payer: Self-pay | Admitting: Oncology

## 2012-10-18 ENCOUNTER — Other Ambulatory Visit (HOSPITAL_BASED_OUTPATIENT_CLINIC_OR_DEPARTMENT_OTHER): Payer: Medicare Other | Admitting: Lab

## 2012-10-18 ENCOUNTER — Ambulatory Visit (HOSPITAL_BASED_OUTPATIENT_CLINIC_OR_DEPARTMENT_OTHER): Payer: Medicare Other

## 2012-10-18 ENCOUNTER — Ambulatory Visit: Payer: Medicare Other

## 2012-10-18 ENCOUNTER — Ambulatory Visit (HOSPITAL_BASED_OUTPATIENT_CLINIC_OR_DEPARTMENT_OTHER): Payer: Medicare Other | Admitting: Oncology

## 2012-10-18 VITALS — BP 102/49 | HR 69 | Temp 98.3°F | Resp 18 | Ht <= 58 in | Wt 101.9 lb

## 2012-10-18 DIAGNOSIS — E538 Deficiency of other specified B group vitamins: Secondary | ICD-10-CM

## 2012-10-18 DIAGNOSIS — C9000 Multiple myeloma not having achieved remission: Secondary | ICD-10-CM

## 2012-10-18 DIAGNOSIS — D519 Vitamin B12 deficiency anemia, unspecified: Secondary | ICD-10-CM

## 2012-10-18 DIAGNOSIS — D509 Iron deficiency anemia, unspecified: Secondary | ICD-10-CM

## 2012-10-18 DIAGNOSIS — Z5112 Encounter for antineoplastic immunotherapy: Secondary | ICD-10-CM

## 2012-10-18 DIAGNOSIS — D6489 Other specified anemias: Secondary | ICD-10-CM

## 2012-10-18 LAB — COMPREHENSIVE METABOLIC PANEL (CC13)
ALT: 15 U/L (ref 0–55)
AST: 17 U/L (ref 5–34)
Albumin: 3.1 g/dL — ABNORMAL LOW (ref 3.5–5.0)
Alkaline Phosphatase: 64 U/L (ref 40–150)
BUN: 10.6 mg/dL (ref 7.0–26.0)
Potassium: 4.2 mEq/L (ref 3.5–5.1)
Sodium: 135 mEq/L — ABNORMAL LOW (ref 136–145)
Total Protein: 6.8 g/dL (ref 6.4–8.3)

## 2012-10-18 LAB — CBC WITH DIFFERENTIAL/PLATELET
BASO%: 1.7 % (ref 0.0–2.0)
EOS%: 1.4 % (ref 0.0–7.0)
MCH: 32.8 pg (ref 25.1–34.0)
MCHC: 33.1 g/dL (ref 31.5–36.0)
RBC: 3.14 10*6/uL — ABNORMAL LOW (ref 3.70–5.45)
RDW: 15.7 % — ABNORMAL HIGH (ref 11.2–14.5)
WBC: 3.6 10*3/uL — ABNORMAL LOW (ref 3.9–10.3)
lymph#: 0.9 10*3/uL (ref 0.9–3.3)
nRBC: 0 % (ref 0–0)

## 2012-10-18 MED ORDER — SODIUM CHLORIDE 0.9 % IV SOLN
Freq: Once | INTRAVENOUS | Status: AC
  Administered 2012-10-18: 13:00:00 via INTRAVENOUS

## 2012-10-18 MED ORDER — CYANOCOBALAMIN 1000 MCG/ML IJ SOLN
1000.0000 ug | Freq: Once | INTRAMUSCULAR | Status: AC
  Administered 2012-10-18: 1000 ug via INTRAMUSCULAR

## 2012-10-18 MED ORDER — ONDANSETRON 8 MG/50ML IVPB (CHCC)
8.0000 mg | Freq: Once | INTRAVENOUS | Status: AC
  Administered 2012-10-18: 8 mg via INTRAVENOUS

## 2012-10-18 MED ORDER — DEXAMETHASONE SODIUM PHOSPHATE 20 MG/5ML IJ SOLN
20.0000 mg | Freq: Once | INTRAMUSCULAR | Status: AC
  Administered 2012-10-18: 20 mg via INTRAVENOUS

## 2012-10-18 MED ORDER — BORTEZOMIB CHEMO SQ INJECTION 3.5 MG (2.5MG/ML)
1.3000 mg/m2 | Freq: Once | INTRAMUSCULAR | Status: AC
  Administered 2012-10-18: 2 mg via SUBCUTANEOUS
  Filled 2012-10-18: qty 2

## 2012-10-18 MED ORDER — SODIUM CHLORIDE 0.9 % IV SOLN
400.0000 mg | Freq: Once | INTRAVENOUS | Status: AC
  Administered 2012-10-18: 400 mg via INTRAVENOUS
  Filled 2012-10-18: qty 20

## 2012-10-18 NOTE — Progress Notes (Signed)
This office note has been dictated.  #[id number]

## 2012-10-18 NOTE — Patient Instructions (Addendum)
[facility] Discharge Instructions for Patients Receiving Chemotherapy  Today you received the following chemotherapy agents: cytoxan, velcade.  To help prevent nausea and vomiting after your treatment, we encourage you to take your nausea medication.  Take it as often as prescribed.     If you develop nausea and vomiting that is not controlled by your nausea medication, call the clinic. If it is after clinic hours your family physician or the after hours number for the clinic or go to the Emergency Department.   BELOW ARE SYMPTOMS THAT SHOULD BE REPORTED IMMEDIATELY:  *FEVER GREATER THAN 100.5 F  *CHILLS WITH OR WITHOUT FEVER  NAUSEA AND VOMITING THAT IS NOT CONTROLLED WITH YOUR NAUSEA MEDICATION  *UNUSUAL SHORTNESS OF BREATH  *UNUSUAL BRUISING OR BLEEDING  TENDERNESS IN MOUTH AND THROAT WITH OR WITHOUT PRESENCE OF ULCERS  *URINARY PROBLEMS  *BOWEL PROBLEMS  UNUSUAL RASH Items with * indicate a potential emergency and should be followed up as soon as possible.  Feel free to call the clinic you have any questions or concerns. The clinic phone number is [phone].   I have been informed and understand all the instructions given to me. I know to contact the clinic, my physician, or go to the Emergency Department if any problems should occur. I do not have any questions at this time, but understand that I may call the clinic during office hours   should I have any questions or need assistance in obtaining follow up care.    __________________________________________  _____________  __________ Signature of Patient or Authorized Representative            Date                   Time    __________________________________________ Nurse's Signature    

## 2012-10-19 LAB — KAPPA/LAMBDA LIGHT CHAINS
Kappa:Lambda Ratio: 20.94 — ABNORMAL HIGH (ref 0.26–1.65)
Lambda Free Lght Chn: 0.64 mg/dL (ref 0.57–2.63)

## 2012-10-19 LAB — IGA: IgA: 1090 mg/dL — ABNORMAL HIGH (ref 69–380)

## 2012-10-19 NOTE — Progress Notes (Signed)
CC:   Christina Hopkins, M.D. Christina Hopkins. Christina Hopkins, M.D. Christina Hopkins, M.D.  PROBLEM LIST:  1. Multiple myeloma, initially presenting as an IgA kappa monoclonal  gammopathy in February 2009. 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 This was felt to be an adverse prognostic  determinant. Bone marrow on 08/31/2007 showed 46% plasma cells. A  repeat bone marrow on 08/05/2009 showed 73% plasma cells.  Treatment with Revlimid was started in April 2011. Zometa was  started in October 2011 and 2 years of treatment were concluded  on 02/23/2012. We had previously started Aranesp for the  patient's anemia in May of 2010. Most recent metastatic bone  survey was on 08/05/2009. Other x-rays since then are available.  Maximum IgA level was 2080 on 08/11/2009.  Subcutaneous Velcade was added to the patient's treatment program on 06/14/2011 because of a rising IgA level 1080 on 06/02/2011. The patient had been receiving Velcade every 2 weeks in combination with Revlimid during the early months of 2014. The patient continues to demonstrate progressive disease as evidenced by rising IgA and serum kappa levels. Revlimid was discontinued in April 2014. The patient had received Revlimid for about 3 years from April 2011  through April 2014. As of 08/29/2012, the patient is now receiving  Velcade, Cytoxan and Decadron every 2 weeks. The patient has also been receiving  Aranesp 300 mcg subcu every 2 weeks for hemoglobin less than or equal to 10.  2. Anemia secondary to vitamin B12 deficiency.  The patient had been on oral vitamin B12. However, her vitamin B12  level fell to 284 on 07/26/2011 and vitamin B12 shots 1000 mcg  given IM every month was resumed on 09/06/2011.  3. Anemia secondary to iron deficiency.  4. Vitamin D deficiency.  5. Magnesium deficiency.  6. Diabetes mellitus.  7. Hypertension.  8. Dyslipidemia.  9. Coronary artery disease.  10. Right anterior thigh mass, most likely a  lipoma.  11. Multiple vertebral compression fractures as seen  on MRI of the lumbar spine on 08/27/2011. Kyphoplasty was carried out at L5, T11 and T12 on 10/07/2011 by Dr. Julieanne Hopkins.  12. Left vocal cord paralysis, complete, noted 05/06/2012. The patient underwent a Radiesse vocal cord augmentation on 08/13/2012 by Dr. Flo Hopkins.  13. Tardive dyskinesia noted in March 2014. The patient had been on  Remeron which was discontinued. 14. The patient has a living will.   MEDICATIONS:  Reviewed and recorded. Current Outpatient Prescriptions  Medication Sig Dispense Refill  . acetaminophen (TYLENOL) 650 MG CR tablet Take 650 mg by mouth every 8 (eight) hours as needed for pain.      Marland Kitchen acyclovir (ZOVIRAX) 400 MG tablet Take 1 tablet (400 mg total) by mouth 2 (two) times daily.  60 tablet  3  . Alum & Mag Hydroxide-Simeth (MAGIC MOUTHWASH) SOLN Swish and spit 5 mLs 4 (four) times daily as needed (for mouth ulcers).       Marland Kitchen aspirin EC 81 MG tablet Take 81 mg by mouth daily.      Marland Kitchen atorvastatin (LIPITOR) 40 MG tablet Take 40 mg by mouth daily.       . carvedilol (COREG) 25 MG tablet Take 25 mg by mouth 2 (two) times daily with a meal.      . Cholecalciferol (VITAMIN D) 2000 UNITS tablet Take 2,000 Units by mouth at bedtime.      . Cholecalciferol (VITAMIN D-3) 5000 UNITS TABS Take 1 tablet by mouth at bedtime.      Marland Kitchen  cyanocobalamin (,VITAMIN B-12,) 1000 MCG/ML injection Inject 1,000 mcg into the muscle every 30 (thirty) days.      . cyclobenzaprine (FLEXERIL) 10 MG tablet Take 1 tablet (10 mg total) by mouth 3 (three) times daily as needed. For muscle spasm  90 tablet  1  . darbepoetin (ARANESP) 300 MCG/0.6ML SOLN Inject 300 mcg into the skin as needed (given if needed for anemia).      Marland Kitchen HYDROcodone-acetaminophen (NORCO) 10-325 MG per tablet Take 1 tablet by mouth every 6 (six) hours as needed for pain.      Marland Kitchen lisinopril (PRINIVIL,ZESTRIL) 2.5 MG tablet Take 1 tablet (2.5 mg total) by  mouth daily.  30 tablet  0  . metFORMIN (GLUCOPHAGE) 500 MG tablet Take 500 mg by mouth 2 (two) times daily with a meal.        . Multiple Vitamin (MULTIVITAMIN WITH MINERALS) TABS Take 1 tablet by mouth daily.      . pantoprazole (PROTONIX) 40 MG tablet Take 40 mg by mouth daily.        Bertram Gala Glycol-Propyl Glycol (SYSTANE OP) Place 2 drops into both eyes at bedtime.       Marland Kitchen PRESCRIPTION MEDICATION Inject into the vein every 7 (seven) days. Velcade and Cytoxan infusion q7d on Wednesdays.      . prochlorperazine (COMPAZINE) 10 MG tablet Take 1 tablet (10 mg total) by mouth every 6 (six) hours as needed. For nausea  30 tablet  3  . Sodium Fluoride (PREVIDENT DT) Place onto teeth at bedtime.      Marland Kitchen spironolactone (ALDACTONE) 25 MG tablet Take 25 mg by mouth every other day.        No current facility-administered medications for this visit.   Facility-Administered Medications Ordered in Other Visits  Medication Dose Route Frequency Provider Last Rate Last Dose  . darbepoetin (ARANESP) injection 300 mcg  300 mcg Subcutaneous Once Christina Dada, MD       TREATMENT PROGRAM:  1. Velcade 2 mg subcutaneous every 2 weeks. Velcade was started on  06/14/2011.  2. Cytoxan 400 mg IV started on 08/29/2012.  3. Decadron 20 mg IV started on 08/29/2012.  4. Aranesp 300 mcg subcu every 2 weeks for hemoglobin less than or  equal to 10.  5. Vitamin B12 1000 mcg IM monthly restarted on 09/06/2011.  6. The patient received Zometa 3.5 mg IV from 02/14/2010 through  02/23/2012.    IMMUNIZATIONS:  1. Pneumovax was apparently given about 9 years ago when the patient  was 79 years old.  2. Flu shot was given on 01/27/2012.    SMOKING HISTORY: The patient has never smoked cigarettes.   HISTORY:  Christina Hopkins was seen today for followup of her IgA kappa multiple myeloma.  Christina Hopkins was accompanied by her daughter, Christina Hopkins.  She was last seen by [place] on 09/20/2012.  At that time we held  her chemotherapy because she had previously received chemotherapy on 09/12/2012 and we were treating her every 2 weeks.  When she returned a week later on 09/27/2012 her ANC was 1.2 and we elected to hold chemo. Chemotherapy was given on 10/04/2012.  The patient last received a vitamin B12 shot on 09/12/2012.  She received Aranesp most recently on 09/12/2012.  Aranesp is being given every 2 weeks for hemoglobin less than or equal to 10.  The patient seems to be tolerating her chemotherapy well.  She is not having any adverse affects.  She has diarrhea alternating with  constipation.  Previously she was having diarrhea.  That is markedly improved.  She may be having a little bit more drooling in the last few weeks.  The patient denies any significant musculoskeletal pain.  She is without other issues today.  PHYSICAL EXAMINATION:  There is little change.  Weight is 101 pounds 14.4 ounces.  Height 4 feet 9 inches.  Body surface area 1.36 sq m. Blood pressure 102/49.  Other vital signs are normal.  No scleral icterus.  Mouth and pharynx are benign.  There is no peripheral adenopathy palpable.  Heart and lungs are normal.  The patient does not have a Port-A-Cath or central catheter.  Abdomen with the patient sitting is benign.  There is marked dorsal kyphosis.  Extremities:  No peripheral edema.  The patient continues to have a lipoma involving the right lower proximal thigh.  Neurologic exam is nonfocal.  The patient does have tardive dyskinesia.  LABORATORY DATA: Today, white count 3.6, ANC 2.1, hemoglobin 10.3, hematocrit 31.1, platelets 146,000.  Chemistries, IgA and light chains are pending.  On 09/20/2012 chemistries were notable for an albumin of 3.1, total protein 6.5 and thus globulins were 3.5.  BUN 9, creatinine 0.8.  IgA was 928 down from 1480 on 08/22/2012.  Kappa free light chain was 14.10 and the kappa/lambda ratio was 38.11.   IMAGING STUDIES:  1. Metastatic bone  survey was on 08/05/2009. Prior to  that, we had a metastatic bone survey from 11/03/2008 and  07/23/2007. The metastatic bone survey from 08/05/2009 stated that  there were no findings to strongly suggest metastatic disease.  There were multiple sites of degenerative change.  2. Chest x-ray from 02/09/2010 showed new or increased T6 and T7  compression fractures compared with the skeletal survey of  08/05/2009.  3. CT scan of head without IV contrast showed no acute findings. The  calvarium was intact. There was a probable old lacunar infarct in  the right basal ganglia.  4. There are x-rays of the left hip and lumbar spine from 07/16/2010.  There are x-rays of the left humerus and cervical spine from  06/30/2010.  5. An ultrasound of the right lower extremity was carried out on 08/18/2010  and did not show a discrete mass in the right thigh.  6. Screening mammogram from 02/01/2011 carried out at [facility] was negative.  7. MRI of the lumbar spine with and without IV contrast on 08/27/2011  showed acute/subacute superior endplate fracture of L5 with minimal  loss of vertebral body height and no significant retropulsion.  There was a nonhealed superior endplate compression fracture at T11  and T12 with associated kyphosis. There was central canal stenosis  greatest at L4-5, foraminal stenosis greatest at L2-3 and L3-4, and  nonhealed sacral insufficiency fractures. There was multilevel  spondylosis of the lumbar spine.  8. Kyphoplasty at L5, T11 and T12 carried out on 10/07/2011 by Dr.  Julieanne Hopkins was carried out successfully. Biopsies showed  blood clot and hypercellular marrow with increased plasma cell  infiltrates at L5, T11 and T12.  9. Chest x-ray, 2 view, from 05/04/2012 showed enlargement of the  cardiac silhouette and evidence of CABG. There were chronic  bronchitic changes with left basilar scarring. Osteoporosis with  multiple thoracic spine compression  fractures was present. There  were no acute abnormalities.  10. X-rays of thoracic spine, 2 view, from 05/04/2012 showed evidence  of mid T3 and L1 compression fractures which are new since 2011 and  April 2013 respectively. There was interval augmentation of T11  and T12 fractures as well as chronic T6 and T7 compression  fractures.  11. CT of the head without IV contrast on 05/04/2012 showed no acute  intracranial abnormalities and no significant changes compared with  the head CT from 07/16/2010.  12. MRI of the thoracic spine without IV contrast on 05/05/2012  confirmed the acute/subacute inferior endplate compression fracture  of C3 with approximately 25% loss of height. The remote inferior  endplate fractures at T6 and T7 demonstrate some residual edema  suggesting these are incompletely healed or have continued to  fracture. There was a superior endplate compression fracture at  T10 with minimal loss of height. There was evidence of vertebral  augmentation at T11 and T12 without residual edema. There was mild  spondylosis of the thoracic spine and small bilateral pleural  effusions with associated atelectasis.  13. Swallowing evaluation on 06/04/2012 showed severe oral phase  dysphagia, severe pharyngeal phase dysphagia and moderate cervical  esophageal phase dysphagia. Aspiration of secretions and liquids  were noted.  14. CT scan of the neck with IV contrast on 07/12/2012 showed lateral  deviation of the left vocal cord compatible with paralysis. There  was no focal lesion along the course of the recurrent laryngeal  nerve to explain the finding. There was no primary mucosal  neoplasm or evidence of metastatic disease. There was extensive  atherosclerotic change without significant stenosis. There was  moderate spondylosis of the cervical spine. There was an 8 mm  obstructing stone in the proximal right submandibular duct with  chronic atrophy of the right submandibular  gland.  15. CT scan of the chest with IV contrast on 07/25/2012 showed no  mediastinal mass or other etiology to explain the presence of the  patient's left vocal cord paralysis. There has been slight interval  progression of T10 superior endplate compression deformity.  16. Acute abdomen series (abdomen 2 view and chest 1 view) on 09/05/2012  showed nonobstructive bowel gas pattern with colonic air fluid levels.  Although nonspecific, fluid within the colon is abnormal. This is most  commonly associated with enteric infection. 17. MRI of the brain without IV contrast on 07/15/2012 showed mild changes of age appropriate and chronic microvascular ischemia. 18. MRA of the head carried out on 07/15/2012 at [facility] showed no significant stenosis of the medium to large size intracranial vessels.  There was persistent bilateral fetal origin of both posterior cerebral arteries and is felt to be a benign variant.    IMPRESSION AND PLAN:  Ms. Hubers seems to be tolerating her current treatment program fairly well.  It will be recalled that in April we discontinued Revlimid and substituted Cytoxan.  The patient is also receiving Decadron.  Treatments are being given every 2 weeks.  She will be due for vitamin B12 today.  She does not need Aranesp as her hemoglobin is 10.3.  We will plan to have the patient return in 2 weeks which will be June 19.  She will be due for CBC.  We will also check iron studies which we have not checked in a while.  We will also schedule the patient for a metastatic bone survey.  The patient has had sundry x-rays and scans but we have not obtained a formal bone scan in a couple years.  We will plan to see Ms. Esquibel again around July 3 which will be 4 weeks from today.  At that time we  will check CBC, chemistries, quantitative immunoglobulins, serum protein electrophoresis, serum immunofixation electrophoresis and serum for light  chains.  Comment:  At this point, it seems that the patient is responding to the new regimen with a decrease in her IgA level.    ______________________________ Christina Hopkins, M.D. DSM/MEDQ  D:  10/18/2012  T:  10/19/2012  Job:  [id number]

## 2012-10-24 ENCOUNTER — Telehealth: Payer: Self-pay | Admitting: *Deleted

## 2012-10-24 ENCOUNTER — Other Ambulatory Visit: Payer: Self-pay | Admitting: Oncology

## 2012-10-24 ENCOUNTER — Telehealth: Payer: Self-pay | Admitting: Dietician

## 2012-10-24 ENCOUNTER — Telehealth: Payer: Self-pay | Admitting: Medical Oncology

## 2012-10-24 NOTE — Telephone Encounter (Signed)
Mary-daughter called and left a message that she received a call about appointments for pt tomorrow. According to Dr. Arline Asp her next lab and chemo should be June 19 and then return in early July. Per Dr. Arline Asp this is correct. The appointments for tomorrow cancelled and Marcelino Duster loaded the future appointments. I called [name] and left her a message with the correct dates and times.

## 2012-10-24 NOTE — Telephone Encounter (Signed)
Brief Outpatient Oncology Nutrition Note  Patient has been identified to be at risk on malnutrition screen.  Wt Readings from Last 10 Encounters:  10/18/12 101 lb 14.4 oz (46.222 kg)  09/20/12 103 lb 1.6 oz (46.766 kg)  09/12/12 106 lb (48.081 kg)  08/22/12 105 lb 11.2 oz (47.945 kg)  08/13/12 108 lb 3.2 oz (49.079 kg)  08/13/12 108 lb 3.2 oz (49.079 kg)  07/19/12 109 lb 14.4 oz (49.85 kg)  06/14/12 109 lb 4.8 oz (49.578 kg)  05/04/12 120 lb 5.9 oz (54.6 kg)  04/19/12 113 lb (51.256 kg)    Spoke with patient 1 month ago.  Followed up today over the phone.  Patient reports that intake has improved a little.  She continues to drink the Ensure/Boost but "causes diarrhea" and drinks very slowly.   Gave other options for shakes/supplements.  Patient to contact us with any nutrition questions or concerns.  Oran Rein, RD, LDN Clinical Inpatient Dietitian Pager:  [phone] Weekend and after hours pager:  [phone]

## 2012-10-24 NOTE — Telephone Encounter (Signed)
Per desk RN I have canceled and rescheduled appts. Per POF from 6/5.   [name]

## 2012-10-25 ENCOUNTER — Ambulatory Visit: Payer: Medicare Other

## 2012-10-25 ENCOUNTER — Ambulatory Visit: Payer: Medicare Other | Admitting: Oncology

## 2012-10-25 ENCOUNTER — Other Ambulatory Visit: Payer: Medicare Other | Admitting: Lab

## 2012-11-01 ENCOUNTER — Ambulatory Visit (HOSPITAL_COMMUNITY)
Admission: RE | Admit: 2012-11-01 | Discharge: 2012-11-01 | Disposition: A | Discharge status: Home / Self Care | Payer: Medicare Other | Source: Ambulatory Visit | Attending: Oncology | Admitting: Oncology

## 2012-11-01 ENCOUNTER — Other Ambulatory Visit (HOSPITAL_BASED_OUTPATIENT_CLINIC_OR_DEPARTMENT_OTHER): Payer: Medicare Other | Admitting: Lab

## 2012-11-01 ENCOUNTER — Ambulatory Visit (HOSPITAL_BASED_OUTPATIENT_CLINIC_OR_DEPARTMENT_OTHER): Payer: Medicare Other

## 2012-11-01 VITALS — BP 104/65 | HR 68 | Temp 97.7°F | Resp 18

## 2012-11-01 DIAGNOSIS — C9000 Multiple myeloma not having achieved remission: Secondary | ICD-10-CM

## 2012-11-01 DIAGNOSIS — K449 Diaphragmatic hernia without obstruction or gangrene: Secondary | ICD-10-CM | POA: Insufficient documentation

## 2012-11-01 DIAGNOSIS — M25519 Pain in unspecified shoulder: Secondary | ICD-10-CM | POA: Insufficient documentation

## 2012-11-01 DIAGNOSIS — Z5112 Encounter for antineoplastic immunotherapy: Secondary | ICD-10-CM

## 2012-11-01 DIAGNOSIS — M545 Low back pain, unspecified: Secondary | ICD-10-CM | POA: Insufficient documentation

## 2012-11-01 LAB — CBC WITH DIFFERENTIAL/PLATELET
BASO%: 1 % (ref 0.0–2.0)
EOS%: 1.4 % (ref 0.0–7.0)
HCT: 31.1 % — ABNORMAL LOW (ref 34.8–46.6)
MCH: 33.1 pg (ref 25.1–34.0)
MCHC: 33.1 g/dL (ref 31.5–36.0)
MCV: 100 fL (ref 79.5–101.0)
MONO%: 20.3 % — ABNORMAL HIGH (ref 0.0–14.0)
NEUT%: 54.3 % (ref 38.4–76.8)
lymph#: 0.7 10*3/uL — ABNORMAL LOW (ref 0.9–3.3)

## 2012-11-01 MED ORDER — DEXAMETHASONE SODIUM PHOSPHATE 20 MG/5ML IJ SOLN
20.0000 mg | Freq: Once | INTRAMUSCULAR | Status: AC
  Administered 2012-11-01: 20 mg via INTRAVENOUS

## 2012-11-01 MED ORDER — SODIUM CHLORIDE 0.9 % IV SOLN
400.0000 mg | Freq: Once | INTRAVENOUS | Status: AC
  Administered 2012-11-01: 400 mg via INTRAVENOUS
  Filled 2012-11-01: qty 20

## 2012-11-01 MED ORDER — ONDANSETRON 8 MG/50ML IVPB (CHCC)
8.0000 mg | Freq: Once | INTRAVENOUS | Status: AC
  Administered 2012-11-01: 8 mg via INTRAVENOUS

## 2012-11-01 MED ORDER — BORTEZOMIB CHEMO SQ INJECTION 3.5 MG (2.5MG/ML)
1.3000 mg/m2 | Freq: Once | INTRAMUSCULAR | Status: AC
  Administered 2012-11-01: 2 mg via SUBCUTANEOUS
  Filled 2012-11-01: qty 2

## 2012-11-01 NOTE — Patient Instructions (Signed)
[facility] Discharge Instructions for Patients Receiving Chemotherapy  Today you received the following chemotherapy agents: Velcade and Cytoxan. To help prevent nausea and vomiting after your treatment, we encourage you to take your nausea medication, Compazine. Take this as every six hours as needed.    If you develop nausea and vomiting that is not controlled by your nausea medication, call the clinic.   BELOW ARE SYMPTOMS THAT SHOULD BE REPORTED IMMEDIATELY:  *FEVER GREATER THAN 100.5 F  *CHILLS WITH OR WITHOUT FEVER  NAUSEA AND VOMITING THAT IS NOT CONTROLLED WITH YOUR NAUSEA MEDICATION  *UNUSUAL SHORTNESS OF BREATH  *UNUSUAL BRUISING OR BLEEDING  TENDERNESS IN MOUTH AND THROAT WITH OR WITHOUT PRESENCE OF ULCERS  *URINARY PROBLEMS  *BOWEL PROBLEMS  UNUSUAL RASH Items with * indicate a potential emergency and should be followed up as soon as possible.  Feel free to call the clinic you have any questions or concerns. The clinic phone number is [phone].

## 2012-11-13 ENCOUNTER — Encounter: Payer: Self-pay | Admitting: Oncology

## 2012-11-13 ENCOUNTER — Ambulatory Visit (HOSPITAL_BASED_OUTPATIENT_CLINIC_OR_DEPARTMENT_OTHER): Payer: Medicare Other | Admitting: Oncology

## 2012-11-13 ENCOUNTER — Telehealth: Payer: Self-pay | Admitting: Oncology

## 2012-11-13 ENCOUNTER — Other Ambulatory Visit (HOSPITAL_BASED_OUTPATIENT_CLINIC_OR_DEPARTMENT_OTHER): Payer: Medicare Other | Admitting: Lab

## 2012-11-13 ENCOUNTER — Telehealth: Payer: Self-pay | Admitting: *Deleted

## 2012-11-13 ENCOUNTER — Ambulatory Visit (HOSPITAL_BASED_OUTPATIENT_CLINIC_OR_DEPARTMENT_OTHER): Payer: Medicare Other

## 2012-11-13 VITALS — BP 114/56 | HR 77

## 2012-11-13 VITALS — BP 85/53 | HR 70 | Temp 97.4°F | Resp 20 | Ht <= 58 in | Wt 104.3 lb

## 2012-11-13 DIAGNOSIS — Z5111 Encounter for antineoplastic chemotherapy: Secondary | ICD-10-CM

## 2012-11-13 DIAGNOSIS — E785 Hyperlipidemia, unspecified: Secondary | ICD-10-CM

## 2012-11-13 DIAGNOSIS — D519 Vitamin B12 deficiency anemia, unspecified: Secondary | ICD-10-CM

## 2012-11-13 DIAGNOSIS — R197 Diarrhea, unspecified: Secondary | ICD-10-CM

## 2012-11-13 DIAGNOSIS — D649 Anemia, unspecified: Secondary | ICD-10-CM

## 2012-11-13 DIAGNOSIS — E538 Deficiency of other specified B group vitamins: Secondary | ICD-10-CM

## 2012-11-13 DIAGNOSIS — D509 Iron deficiency anemia, unspecified: Secondary | ICD-10-CM

## 2012-11-13 DIAGNOSIS — E612 Magnesium deficiency: Secondary | ICD-10-CM

## 2012-11-13 DIAGNOSIS — C9000 Multiple myeloma not having achieved remission: Secondary | ICD-10-CM

## 2012-11-13 DIAGNOSIS — I10 Essential (primary) hypertension: Secondary | ICD-10-CM

## 2012-11-13 DIAGNOSIS — I2510 Atherosclerotic heart disease of native coronary artery without angina pectoris: Secondary | ICD-10-CM

## 2012-11-13 DIAGNOSIS — Z5112 Encounter for antineoplastic immunotherapy: Secondary | ICD-10-CM

## 2012-11-13 LAB — CBC WITH DIFFERENTIAL/PLATELET
Basophils Absolute: 0 10*3/uL (ref 0.0–0.1)
EOS%: 0.3 % (ref 0.0–7.0)
HCT: 28.2 % — ABNORMAL LOW (ref 34.8–46.6)
HGB: 9.7 g/dL — ABNORMAL LOW (ref 11.6–15.9)
MCH: 34.4 pg — ABNORMAL HIGH (ref 25.1–34.0)
MONO#: 0.5 10*3/uL (ref 0.1–0.9)
NEUT%: 82.5 % — ABNORMAL HIGH (ref 38.4–76.8)
lymph#: 0.6 10*3/uL — ABNORMAL LOW (ref 0.9–3.3)

## 2012-11-13 LAB — COMPREHENSIVE METABOLIC PANEL (CC13)
ALT: 9 U/L (ref 0–55)
AST: 13 U/L (ref 5–34)
Creatinine: 0.8 mg/dL (ref 0.6–1.1)
Total Bilirubin: 0.62 mg/dL (ref 0.20–1.20)

## 2012-11-13 LAB — LACTATE DEHYDROGENASE (CC13): LDH: 148 U/L (ref 125–245)

## 2012-11-13 MED ORDER — BORTEZOMIB CHEMO SQ INJECTION 3.5 MG (2.5MG/ML)
1.3000 mg/m2 | Freq: Once | INTRAMUSCULAR | Status: AC
  Administered 2012-11-13: 2 mg via SUBCUTANEOUS
  Filled 2012-11-13: qty 2

## 2012-11-13 MED ORDER — ONDANSETRON 8 MG/50ML IVPB (CHCC)
8.0000 mg | Freq: Once | INTRAVENOUS | Status: AC
  Administered 2012-11-13: 8 mg via INTRAVENOUS

## 2012-11-13 MED ORDER — SODIUM CHLORIDE 0.9 % IV SOLN
Freq: Once | INTRAVENOUS | Status: AC
  Administered 2012-11-13: 14:00:00 via INTRAVENOUS

## 2012-11-13 MED ORDER — SODIUM CHLORIDE 0.9 % IV SOLN
400.0000 mg | Freq: Once | INTRAVENOUS | Status: AC
  Administered 2012-11-13: 400 mg via INTRAVENOUS
  Filled 2012-11-13: qty 20

## 2012-11-13 MED ORDER — DARBEPOETIN ALFA-POLYSORBATE 300 MCG/0.6ML IJ SOLN
300.0000 ug | Freq: Once | INTRAMUSCULAR | Status: AC
  Administered 2012-11-13: 300 ug via SUBCUTANEOUS
  Filled 2012-11-13: qty 0.6

## 2012-11-13 MED ORDER — DEXAMETHASONE SODIUM PHOSPHATE 20 MG/5ML IJ SOLN
20.0000 mg | Freq: Once | INTRAMUSCULAR | Status: AC
  Administered 2012-11-13: 20 mg via INTRAVENOUS

## 2012-11-13 MED ORDER — CYANOCOBALAMIN 1000 MCG/ML IJ SOLN
1000.0000 ug | Freq: Once | INTRAMUSCULAR | Status: AC
  Administered 2012-11-13: 1000 ug via INTRAMUSCULAR

## 2012-11-13 NOTE — Telephone Encounter (Signed)
Per staff phone call and POF I have schedueld appts.  [name]  

## 2012-11-13 NOTE — Patient Instructions (Addendum)
[facility] Discharge Instructions for Patients Receiving Chemotherapy  Today you received the following chemotherapy agents :  Velcade,  Cytoxan.  To help prevent nausea and vomiting after your treatment, we encourage you to take your nausea medication as instructed by your physician.   If you develop nausea and vomiting that is not controlled by your nausea medication, call the clinic.   BELOW ARE SYMPTOMS THAT SHOULD BE REPORTED IMMEDIATELY:  *FEVER GREATER THAN 100.5 F  *CHILLS WITH OR WITHOUT FEVER  NAUSEA AND VOMITING THAT IS NOT CONTROLLED WITH YOUR NAUSEA MEDICATION  *UNUSUAL SHORTNESS OF BREATH  *UNUSUAL BRUISING OR BLEEDING  TENDERNESS IN MOUTH AND THROAT WITH OR WITHOUT PRESENCE OF ULCERS  *URINARY PROBLEMS  *BOWEL PROBLEMS  UNUSUAL RASH Items with * indicate a potential emergency and should be followed up as soon as possible.  Feel free to call the clinic you have any questions or concerns. The clinic phone number is [phone].    

## 2012-11-13 NOTE — Progress Notes (Signed)
This office note has been dictated.  #[id number]

## 2012-11-13 NOTE — Telephone Encounter (Signed)
gv and printed appt sched and avs for pt....MW added tx   °

## 2012-11-14 ENCOUNTER — Other Ambulatory Visit: Payer: Self-pay | Admitting: Medical Oncology

## 2012-11-14 MED ORDER — HYDROCODONE-ACETAMINOPHEN 10-325 MG PO TABS: 1.0000 | ORAL_TABLET | Freq: Four times a day (QID) | ORAL | Status: DC | PRN

## 2012-11-14 NOTE — Progress Notes (Signed)
CC:   Christina Hopkins, M.D. [name]. [name], M.D. [name], M.D.  PROBLEM LIST:  1. Multiple myeloma, initially presenting as an IgA kappa monoclonal  gammopathy in February 2009. 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 This was felt to be an adverse prognostic  determinant. Bone marrow on 08/31/2007 showed 46% plasma cells. A  repeat bone marrow on 08/05/2009 showed 73% plasma cells.  Treatment with Revlimid was started in April 2011. Zometa was  started in October 2011 and 2 years of treatment were concluded  on 02/23/2012. We had previously started Aranesp for the  patient's anemia in May of 2010. Most recent metastatic bone  survey was on 08/05/2009. Other x-rays since then are available.  Maximum IgA level was 2080 on 08/11/2009.  Subcutaneous Velcade was added to the patient's treatment program on 06/14/2011 because of a rising IgA level 1080 on 06/02/2011. The patient had been receiving Velcade every 2 weeks in combination with Revlimid during the early months of 2014. The patient continues to demonstrate progressive disease as evidenced by rising IgA and serum kappa levels. Revlimid was discontinued in April 2014. The patient had received Revlimid for about 3 years from April 2011  through April 2014. As of 08/29/2012, the patient is now receiving  Velcade, Cytoxan and Decadron every 2 weeks. The patient has also been receiving  Aranesp 300 mcg subcu every 2 weeks for hemoglobin less than or equal to 10.  2. Anemia secondary to vitamin B12 deficiency.  The patient had been on oral vitamin B12. However, her vitamin B12  level fell to 284 on 07/26/2011 and vitamin B12 shots 1000 mcg  given IM every month was resumed on 09/06/2011.  3. Anemia secondary to iron deficiency.  4. Vitamin D deficiency.  5. Magnesium deficiency.  6. Diabetes mellitus.  7. Hypertension.  8. Dyslipidemia.  9. Coronary artery disease.  10. Right anterior thigh mass, most likely a  lipoma.  11. Multiple vertebral compression fractures as seen  on MRI of the lumbar spine on 08/27/2011. Kyphoplasty was carried out at L5, T11 and T12 on 10/07/2011 by Dr. Julieanne Cotton.  12. Left vocal cord paralysis, complete, noted 05/06/2012. The patient underwent a Radiesse vocal cord augmentation on 08/13/2012 by Dr. Flo Shanks.  13. Tardive dyskinesia noted in March 2014. The patient had been on  Remeron which was discontinued.  14. The patient has a living will.    MEDICATIONS:  Reviewed and recorded. Current Outpatient Prescriptions  Medication Sig Dispense Refill  . acetaminophen (TYLENOL) 650 MG CR tablet Take 650 mg by mouth every 8 (eight) hours as needed for pain.      Marland Kitchen acyclovir (ZOVIRAX) 400 MG tablet Take 1 tablet (400 mg total) by mouth 2 (two) times daily.  60 tablet  3  . Alum & Mag Hydroxide-Simeth (MAGIC MOUTHWASH) SOLN Swish and spit 5 mLs 4 (four) times daily as needed (for mouth ulcers).       Marland Kitchen aspirin EC 81 MG tablet Take 81 mg by mouth daily.      Marland Kitchen atorvastatin (LIPITOR) 40 MG tablet Take 40 mg by mouth daily.       . carvedilol (COREG) 25 MG tablet Take 25 mg by mouth 2 (two) times daily with a meal.      . Cholecalciferol (VITAMIN D) 2000 UNITS tablet Take 2,000 Units by mouth at bedtime.      . Cholecalciferol (VITAMIN D-3) 5000 UNITS TABS Take 1 tablet by mouth  at bedtime.      . cyanocobalamin (,VITAMIN B-12,) 1000 MCG/ML injection Inject 1,000 mcg into the muscle every 30 (thirty) days.      . cyclobenzaprine (FLEXERIL) 10 MG tablet Take 1 tablet (10 mg total) by mouth 3 (three) times daily as needed. For muscle spasm  90 tablet  1  . darbepoetin (ARANESP) 300 MCG/0.6ML SOLN Inject 300 mcg into the skin as needed (given if needed for anemia).      Marland Kitchen lisinopril (PRINIVIL,ZESTRIL) 2.5 MG tablet Take 1 tablet (2.5 mg total) by mouth daily.  30 tablet  0  . metFORMIN (GLUCOPHAGE) 500 MG tablet Take 500 mg by mouth 2 (two) times daily with a meal.         . Multiple Vitamin (MULTIVITAMIN WITH MINERALS) TABS Take 1 tablet by mouth daily.      . pantoprazole (PROTONIX) 40 MG tablet Take 40 mg by mouth daily.        Bertram Gala Glycol-Propyl Glycol (SYSTANE OP) Place 2 drops into both eyes at bedtime.       Marland Kitchen PRESCRIPTION MEDICATION Inject into the vein every 7 (seven) days. Velcade and Cytoxan infusion q7d on Wednesdays.      . prochlorperazine (COMPAZINE) 10 MG tablet Take 1 tablet (10 mg total) by mouth every 6 (six) hours as needed. For nausea  30 tablet  3  . Sodium Fluoride (PREVIDENT DT) Place onto teeth at bedtime.      Marland Kitchen spironolactone (ALDACTONE) 25 MG tablet Take 25 mg by mouth every other day.       Marland Kitchen HYDROcodone-acetaminophen (NORCO) 10-325 MG per tablet Take 1 tablet by mouth every 6 (six) hours as needed for pain.  30 tablet  3   No current facility-administered medications for this visit.   Facility-Administered Medications Ordered in Other Visits  Medication Dose Route Frequency Provider Last Rate Last Dose  . darbepoetin (ARANESP) injection 300 mcg  300 mcg Subcutaneous Once Samul Dada, MD         TREATMENT PROGRAM:  1. Velcade 2 mg subcutaneous every 2 weeks. Velcade was started on  06/14/2011.  2. Cytoxan 400 mg IV started on 08/29/2012.  3. Decadron 20 mg IV started on 08/29/2012.  4. Aranesp 300 mcg subcu every 2 weeks for hemoglobin less than or  equal to 10.  5. Vitamin B12 1000 mcg IM monthly restarted on 09/06/2011.  6. The patient received Zometa 3.5 mg IV from 02/14/2010 through  02/23/2012.    IMMUNIZATIONS:  1. Pneumovax was apparently given when the patient was 79 years old.  2. Flu shot was given on 01/27/2012.    SMOKING HISTORY: The patient has never smoked cigarettes.     HISTORY OF PRESENT ILLNESS:  I saw Christina Hopkins today for followup of her IgA kappa multiple myeloma.  [name] was accompanied by her daughter.  She was last seen by [place] on 10/18/2012.  The patient continues to  receive chemotherapy with Velcade, Cytoxan, and Decadron every 2 weeks.  She has not needed Aranesp recently but she will need it today as her hemoglobin is 9.7.  The patient also receives vitamin B12 injections every month.  She is no longer on Zometa.  The patient's overall condition is unchanged.  She received chemotherapy 2 weeks ago and apparently had some diarrhea for a few days.  She continues to have some chronic upper back pain without change.  The patient feels a little lightheaded today and, in fact, her  blood pressure is 85/53.  Her daughter, who is an [employer] fundraiser, takes her blood pressure at home and says it has not been this low.  They did hold her lisinopril and Aldactone today.  She did take Coreg.  The patient states that her swallowing and voice have returned to baseline.  She is no longer having diarrhea.  There are no new pains. Overall, the patient's condition seems to be more or less stable.  She is here for treatment today.  PHYSICAL EXAMINATION:  General:  Patient shows no major changes.  Vital Signs:  Weight is 104 pounds 4.8 ounces, height 4 feet 9 inches, body surface area 1.38 sq m.  Blood pressure today as stated, 85/53 as compared with 104/65 on 11/01/2012.  Other vital signs are normal.  O2 saturation on room air at rest was 96%.  The patient denies any breathing problems.  HEENT:  There is no scleral icterus.  Mouth and pharynx are benign.  No peripheral adenopathy palpable.  Lungs: Decreased breath sounds and some rhonchi at the right base.  At the left base, there are some inspiratory rales.  Cardiac:  Normal.  The patient does not have a Port-A-Cath or central catheter.  Abdomen:  With the patient sitting, is benign.  There is marked dorsal kyphosis and a gibbus deformity in the region of the lower thoracic spine.  No peripheral edema.  The patient continues to have a lipoma involving the right lower proximal thigh.  Neurologic:  Nonfocal.  The patient  is markedly stooped over and she continues to have tardive dyskinesia.  It will be recalled that we stopped Remeron thinking that this may have been a causative agent.  LABORATORY DATA:  White count 6.0, ANC 5.6m hemoglobin 9.7, hematocrit 28.2, platelets 127,000.  Chemistries today:  Albumin 3.1, total protein 6.24, globulins of 3.1, LDH 148.  BUN 11.4, creatinine 0.8, and calcium 9.0.  Pending today are quantitative immunoglobulins, serum immunofixation electrophoresis, serum protein electrophoresis, kappa and lambda light chains, and iron studies.  On 10/18/2012, the IgA level was 1090, up from 928 on 09/20/2012.  Serum kappa light chains were 13.40 and the kappa/lambda ratio was 20.94.  All things considered, these values are more or less stable, but certainly improved from a few months ago.  IMAGING STUDIES:  1. Metastatic bone survey was on 08/05/2009. Prior to  that, we had a metastatic bone survey from 11/03/2008 and  07/23/2007. The metastatic bone survey from 08/05/2009 stated that  there were no findings to strongly suggest metastatic disease.  There were multiple sites of degenerative change.  2. Chest x-ray from 02/09/2010 showed new or increased T6 and T7  compression fractures compared with the skeletal survey of  08/05/2009.  3. CT scan of head without IV contrast showed no acute findings. The  calvarium was intact. There was a probable old lacunar infarct in  the right basal ganglia.  4. There are x-rays of the left hip and lumbar spine from 07/16/2010.  There are x-rays of the left humerus and cervical spine from  06/30/2010.  5. An ultrasound of the right lower extremity was carried out on 08/18/2010  and did not show a discrete mass in the right thigh.  6. Screening mammogram from 02/01/2011 carried out at [facility] was negative.  7. MRI of the lumbar spine with and without IV contrast on 08/27/2011  showed acute/subacute superior endplate fracture  of L5 with minimal  loss of vertebral body height and no  significant retropulsion.  There was a nonhealed superior endplate compression fracture at T11  and T12 with associated kyphosis. There was central canal stenosis  greatest at L4-5, foraminal stenosis greatest at L2-3 and L3-4, and  nonhealed sacral insufficiency fractures. There was multilevel  spondylosis of the lumbar spine.  8. Kyphoplasty at L5, T11 and T12 carried out on 10/07/2011 by Dr.  Julieanne Cotton was carried out successfully. Biopsies showed  blood clot and hypercellular marrow with increased plasma cell  infiltrates at L5, T11 and T12.  9. Chest x-ray, 2 view, from 05/04/2012 showed enlargement of the  cardiac silhouette and evidence of CABG. There were chronic  bronchitic changes with left basilar scarring. Osteoporosis with  multiple thoracic spine compression fractures was present. There  were no acute abnormalities.  10. X-rays of thoracic spine, 2 view, from 05/04/2012 showed evidence  of mid T3 and L1 compression fractures which are new since 2011 and  April 2013 respectively. There was interval augmentation of T11  and T12 fractures as well as chronic T6 and T7 compression  fractures.  11. CT of the head without IV contrast on 05/04/2012 showed no acute  intracranial abnormalities and no significant changes compared with  the head CT from 07/16/2010.  12. MRI of the thoracic spine without IV contrast on 05/05/2012  confirmed the acute/subacute inferior endplate compression fracture  of C3 with approximately 25% loss of height. The remote inferior  endplate fractures at T6 and T7 demonstrate some residual edema  suggesting these are incompletely healed or have continued to  fracture. There was a superior endplate compression fracture at  T10 with minimal loss of height. There was evidence of vertebral  augmentation at T11 and T12 without residual edema. There was mild  spondylosis of the thoracic spine  and small bilateral pleural  effusions with associated atelectasis.  13. Swallowing evaluation on 06/04/2012 showed severe oral phase  dysphagia, severe pharyngeal phase dysphagia and moderate cervical  esophageal phase dysphagia. Aspiration of secretions and liquids  were noted.  14. CT scan of the neck with IV contrast on 07/12/2012 showed lateral  deviation of the left vocal cord compatible with paralysis. There  was no focal lesion along the course of the recurrent laryngeal  nerve to explain the finding. There was no primary mucosal  neoplasm or evidence of metastatic disease. There was extensive  atherosclerotic change without significant stenosis. There was  moderate spondylosis of the cervical spine. There was an 8 mm  obstructing stone in the proximal right submandibular duct with  chronic atrophy of the right submandibular gland.  15. MRI of the brain without IV contrast on 07/15/2012  showed mild changes of age appropriate and chronic microvascular  ischemia.  16. MRA of the head carried out on 07/15/2012 at [facility] showed no significant stenosis of the medium to large size  intracranial vessels. There was persistent bilateral fetal origin of  both posterior cerebral arteries and is felt to be a benign variant. 17. CT scan of the chest with IV contrast on 07/25/2012 showed no  mediastinal mass or other etiology to explain the presence of the  patient's left vocal cord paralysis. There has been slight interval  progression of T10 superior endplate compression deformity.  18. Acute abdomen series (abdomen 2 view and chest 1 view) on 09/05/2012  showed nonobstructive bowel gas pattern with colonic air fluid levels.  Although nonspecific, fluid within the colon is abnormal. This is most  commonly associated with  enteric infection.  19. Metastatic bone survey carried out on 11/01/2012 shows osseous demineralization with multiple thoracolumbar compression  fractures and prior vertebroplasties.  There was an inferior endplate compression deformity at L3 that was new since 2011, though of uncertain age.  No definite destructive lytic lesion was identified to suggest new multiple myelomatous sites.  There were scattered degenerative changes and a small hiatal hernia as well as extensive atherosclerotic disease.  There was a questionable right obturator hernia.   IMPRESSION AND PLAN:  Clinically, Mrs. Ketner seems to be doing well and tolerating her current treatment program without any major problems, with the possible exception of some diarrhea following her last treatment.  Her IgA level and serum light chains have improved from a couple of months ago after we changed her treatment.  Today, we will go ahead with chemotherapy as planned, Velcade 2 mg subcutaneous, Cytoxan 400 mg, IV Decadron 20 mg IV.  The patient needs Aranesp today 300 mcg subcu for her hemoglobin of 9.7.  She will also receive vitamin B12 injection 1000 mcg IM.  The patient is scheduled for CBC and another cycle of chemotherapy on July 15th.  We are treating her every 2 weeks at the present time.  She will come in for labs, chemotherapy, and physician appointment on December 11, 2012.  Labs will consist of CBC, chemistries, IgA level, and serum light chains.  Again, the patient will continue to receive vitamin B12 1000 mcg IM every month and Aranesp 300 mcg subcu every 2 weeks as needed for hemoglobin less than 10.    ______________________________ Samul Dada, M.D. DSM/MEDQ  D:  11/13/2012  T:  11/14/2012  Job:  [id number]

## 2012-11-15 ENCOUNTER — Ambulatory Visit: Payer: Medicare Other | Admitting: Oncology

## 2012-11-15 LAB — KAPPA/LAMBDA LIGHT CHAINS
Kappa free light chain: 10.8 mg/dL — ABNORMAL HIGH (ref 0.33–1.94)
Kappa:Lambda Ratio: 9.47 — ABNORMAL HIGH (ref 0.26–1.65)

## 2012-11-15 LAB — SPEP & IFE WITH QIG
Albumin ELP: 54.4 % — ABNORMAL LOW (ref 55.8–66.1)
Alpha-1-Globulin: 5.5 % — ABNORMAL HIGH (ref 2.9–4.9)
IgM, Serum: <5 mg/dL — ABNORMAL LOW (ref 52–322)
Total Protein, Serum Electrophoresis: 5.9 g/dL — ABNORMAL LOW (ref 6.0–8.3)

## 2012-11-27 ENCOUNTER — Other Ambulatory Visit (HOSPITAL_BASED_OUTPATIENT_CLINIC_OR_DEPARTMENT_OTHER): Payer: Medicare Other

## 2012-11-27 ENCOUNTER — Ambulatory Visit (HOSPITAL_BASED_OUTPATIENT_CLINIC_OR_DEPARTMENT_OTHER): Payer: Medicare Other

## 2012-11-27 VITALS — BP 125/68 | HR 71 | Temp 98.3°F | Resp 18

## 2012-11-27 DIAGNOSIS — C9000 Multiple myeloma not having achieved remission: Secondary | ICD-10-CM

## 2012-11-27 DIAGNOSIS — Z5111 Encounter for antineoplastic chemotherapy: Secondary | ICD-10-CM

## 2012-11-27 DIAGNOSIS — Z5112 Encounter for antineoplastic immunotherapy: Secondary | ICD-10-CM

## 2012-11-27 LAB — CBC WITH DIFFERENTIAL/PLATELET
Basophils Absolute: 0 10*3/uL (ref 0.0–0.1)
Eosinophils Absolute: 0 10*3/uL (ref 0.0–0.5)
HGB: 11.3 g/dL — ABNORMAL LOW (ref 11.6–15.9)
LYMPH%: 16.3 % (ref 14.0–49.7)
MCV: 101.5 fL — ABNORMAL HIGH (ref 79.5–101.0)
MONO%: 14.9 % — ABNORMAL HIGH (ref 0.0–14.0)
NEUT#: 1.9 10*3/uL (ref 1.5–6.5)
Platelets: 165 10*3/uL (ref 145–400)
RDW: 16 % — ABNORMAL HIGH (ref 11.2–14.5)

## 2012-11-27 MED ORDER — SODIUM CHLORIDE 0.9 % IV SOLN
Freq: Once | INTRAVENOUS | Status: AC
  Administered 2012-11-27: 09:00:00 via INTRAVENOUS

## 2012-11-27 MED ORDER — BORTEZOMIB CHEMO SQ INJECTION 3.5 MG (2.5MG/ML)
1.3000 mg/m2 | Freq: Once | INTRAMUSCULAR | Status: AC
  Administered 2012-11-27: 2 mg via SUBCUTANEOUS
  Filled 2012-11-27: qty 2

## 2012-11-27 MED ORDER — ONDANSETRON 8 MG/50ML IVPB (CHCC)
8.0000 mg | Freq: Once | INTRAVENOUS | Status: AC
  Administered 2012-11-27: 8 mg via INTRAVENOUS

## 2012-11-27 MED ORDER — DEXAMETHASONE SODIUM PHOSPHATE 20 MG/5ML IJ SOLN
20.0000 mg | Freq: Once | INTRAMUSCULAR | Status: AC
  Administered 2012-11-27: 20 mg via INTRAVENOUS

## 2012-11-27 MED ORDER — SODIUM CHLORIDE 0.9 % IV SOLN
400.0000 mg | Freq: Once | INTRAVENOUS | Status: AC
  Administered 2012-11-27: 400 mg via INTRAVENOUS
  Filled 2012-11-27: qty 20

## 2012-11-27 NOTE — Patient Instructions (Signed)
[facility] Discharge Instructions for Patients Receiving Chemotherapy  Today you received the following chemotherapy agents Cytoxan/Velcade To help prevent nausea and vomiting after your treatment, we encourage you to take your nausea medication as prescribed.  If you develop nausea and vomiting that is not controlled by your nausea medication, call the clinic.   BELOW ARE SYMPTOMS THAT SHOULD BE REPORTED IMMEDIATELY:  *FEVER GREATER THAN 100.5 F  *CHILLS WITH OR WITHOUT FEVER  NAUSEA AND VOMITING THAT IS NOT CONTROLLED WITH YOUR NAUSEA MEDICATION  *UNUSUAL SHORTNESS OF BREATH  *UNUSUAL BRUISING OR BLEEDING  TENDERNESS IN MOUTH AND THROAT WITH OR WITHOUT PRESENCE OF ULCERS  *URINARY PROBLEMS  *BOWEL PROBLEMS  UNUSUAL RASH Items with * indicate a potential emergency and should be followed up as soon as possible.  Feel free to call the clinic you have any questions or concerns. The clinic phone number is [phone].    

## 2012-12-11 ENCOUNTER — Telehealth: Payer: Self-pay | Admitting: Hematology and Oncology

## 2012-12-11 ENCOUNTER — Ambulatory Visit (HOSPITAL_BASED_OUTPATIENT_CLINIC_OR_DEPARTMENT_OTHER): Payer: Medicare Other | Admitting: Hematology and Oncology

## 2012-12-11 ENCOUNTER — Telehealth: Payer: Self-pay | Admitting: *Deleted

## 2012-12-11 ENCOUNTER — Other Ambulatory Visit (HOSPITAL_BASED_OUTPATIENT_CLINIC_OR_DEPARTMENT_OTHER): Payer: Medicare Other | Admitting: Lab

## 2012-12-11 ENCOUNTER — Ambulatory Visit: Payer: Medicare Other

## 2012-12-11 ENCOUNTER — Ambulatory Visit (HOSPITAL_BASED_OUTPATIENT_CLINIC_OR_DEPARTMENT_OTHER): Payer: Medicare Other

## 2012-12-11 VITALS — BP 115/47 | HR 80 | Temp 98.1°F | Resp 18 | Ht <= 58 in | Wt 99.7 lb

## 2012-12-11 DIAGNOSIS — C9000 Multiple myeloma not having achieved remission: Secondary | ICD-10-CM

## 2012-12-11 DIAGNOSIS — D509 Iron deficiency anemia, unspecified: Secondary | ICD-10-CM

## 2012-12-11 DIAGNOSIS — D518 Other vitamin B12 deficiency anemias: Secondary | ICD-10-CM

## 2012-12-11 DIAGNOSIS — D519 Vitamin B12 deficiency anemia, unspecified: Secondary | ICD-10-CM

## 2012-12-11 DIAGNOSIS — E559 Vitamin D deficiency, unspecified: Secondary | ICD-10-CM

## 2012-12-11 DIAGNOSIS — Z5111 Encounter for antineoplastic chemotherapy: Secondary | ICD-10-CM

## 2012-12-11 DIAGNOSIS — Z5112 Encounter for antineoplastic immunotherapy: Secondary | ICD-10-CM

## 2012-12-11 LAB — CBC WITH DIFFERENTIAL/PLATELET
BASO%: 0.4 % (ref 0.0–2.0)
Basophils Absolute: 0 10*3/uL (ref 0.0–0.1)
Eosinophils Absolute: 0 10*3/uL (ref 0.0–0.5)
HCT: 33.7 % — ABNORMAL LOW (ref 34.8–46.6)
HGB: 11.3 g/dL — ABNORMAL LOW (ref 11.6–15.9)
LYMPH%: 12.8 % — ABNORMAL LOW (ref 14.0–49.7)
MCHC: 33.5 g/dL (ref 31.5–36.0)
MONO#: 0.4 10*3/uL (ref 0.1–0.9)
NEUT%: 77.1 % — ABNORMAL HIGH (ref 38.4–76.8)
Platelets: 130 10*3/uL — ABNORMAL LOW (ref 145–400)
WBC: 4.8 10*3/uL (ref 3.9–10.3)

## 2012-12-11 LAB — LACTATE DEHYDROGENASE (CC13): LDH: 165 U/L (ref 125–245)

## 2012-12-11 LAB — COMPREHENSIVE METABOLIC PANEL (CC13)
BUN: 9.7 mg/dL (ref 7.0–26.0)
CO2: 25 mEq/L (ref 22–29)
Calcium: 9.3 mg/dL (ref 8.4–10.4)
Creatinine: 0.7 mg/dL (ref 0.6–1.1)
Glucose: 145 mg/dl — ABNORMAL HIGH (ref 70–140)
Total Bilirubin: 0.6 mg/dL (ref 0.20–1.20)

## 2012-12-11 MED ORDER — ONDANSETRON 8 MG/50ML IVPB (CHCC)
8.0000 mg | Freq: Once | INTRAVENOUS | Status: AC
  Administered 2012-12-11: 8 mg via INTRAVENOUS

## 2012-12-11 MED ORDER — DEXAMETHASONE SODIUM PHOSPHATE 20 MG/5ML IJ SOLN
20.0000 mg | Freq: Once | INTRAMUSCULAR | Status: AC
  Administered 2012-12-11: 20 mg via INTRAVENOUS

## 2012-12-11 MED ORDER — BORTEZOMIB CHEMO SQ INJECTION 3.5 MG (2.5MG/ML)
1.3000 mg/m2 | Freq: Once | INTRAMUSCULAR | Status: AC
  Administered 2012-12-11: 2 mg via SUBCUTANEOUS
  Filled 2012-12-11: qty 2

## 2012-12-11 MED ORDER — SODIUM CHLORIDE 0.9 % IV SOLN
400.0000 mg | Freq: Once | INTRAVENOUS | Status: AC
  Administered 2012-12-11: 400 mg via INTRAVENOUS
  Filled 2012-12-11: qty 20

## 2012-12-11 MED ORDER — CYANOCOBALAMIN 1000 MCG/ML IJ SOLN
1000.0000 ug | Freq: Once | INTRAMUSCULAR | Status: AC
  Administered 2012-12-11: 1000 ug via INTRAMUSCULAR

## 2012-12-11 NOTE — Progress Notes (Signed)
Injection given with treatment

## 2012-12-11 NOTE — Telephone Encounter (Signed)
Per staff phone call and POF I have schedueld appts.  [name]  

## 2012-12-11 NOTE — Telephone Encounter (Signed)
gv and printed appt sched and avs for pt....MW added tx   °

## 2012-12-11 NOTE — Progress Notes (Signed)
CC:   [name], M.D. [name]. [name], M.D. [name], M.D.  PROBLEM LIST:  1. Multiple myeloma, initially presenting as an IgA kappa monoclonal  gammopathy in February 2009. 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 This was felt to be an adverse prognostic  determinant. Bone marrow on 08/31/2007 showed 46% plasma cells. A  repeat bone marrow on 08/05/2009 showed 73% plasma cells.  Treatment with Revlimid was started in April 2011. Zometa was  started in October 2011 and 2 years of treatment were concluded  on 02/23/2012. We had previously started Aranesp for the  patient's anemia in May of 2010. Most recent metastatic bone  survey was on 08/05/2009. Other x-rays since then are available.  Maximum IgA level was 2080 on 08/11/2009.  Subcutaneous Velcade was added to the patient's treatment program on 06/14/2011 because of a rising IgA level 1080 on 06/02/2011. The patient had been receiving Velcade every 2 weeks in combination with Revlimid during the early months of 2014. The patient continues to demonstrate progressive disease as evidenced by rising IgA and serum kappa levels. Revlimid was discontinued in April 2014. The patient had received Revlimid for about 3 years from April 2011  through April 2014. As of 08/29/2012, the patient is now receiving  Velcade, Cytoxan and Decadron every 2 weeks. The patient has also been receiving  Aranesp 300 mcg subcu every 2 weeks for hemoglobin less than or equal to 10.  2. Anemia secondary to vitamin B12 deficiency.  The patient had been on oral vitamin B12. However, her vitamin B12  level fell to 284 on 07/26/2011 and vitamin B12 shots 1000 mcg  given IM every month was resumed on 09/06/2011.  3. Anemia secondary to iron deficiency.  4. Vitamin D deficiency.  5. Magnesium deficiency.  6. Diabetes mellitus.  7. Hypertension.  8. Dyslipidemia.  9. Coronary artery disease.  10. Right anterior thigh mass, most likely a  lipoma.  11. Multiple vertebral compression fractures as seen  on MRI of the lumbar spine on 08/27/2011. Kyphoplasty was carried out at L5, T11 and T12 on 10/07/2011 by Dr. Julieanne Cotton.  12. Left vocal cord paralysis, complete, noted 05/06/2012. The patient underwent a Radiesse vocal cord augmentation on 08/13/2012 by Dr. Flo Shanks.  13. Tardive dyskinesia noted in March 2014. The patient had been on  Remeron which was discontinued.  14. The patient has a living will.    MEDICATIONS:  Reviewed and recorded. Current Outpatient Prescriptions  Medication Sig Dispense Refill  . acetaminophen (TYLENOL) 650 MG CR tablet Take 650 mg by mouth every 8 (eight) hours as needed for pain.      Marland Kitchen acyclovir (ZOVIRAX) 400 MG tablet Take 1 tablet (400 mg total) by mouth 2 (two) times daily.  60 tablet  3  . Alum & Mag Hydroxide-Simeth (MAGIC MOUTHWASH) SOLN Swish and spit 5 mLs 4 (four) times daily as needed (for mouth ulcers).       Marland Kitchen aspirin EC 81 MG tablet Take 81 mg by mouth daily.      Marland Kitchen atorvastatin (LIPITOR) 40 MG tablet Take 40 mg by mouth daily.       . carvedilol (COREG) 25 MG tablet Take 25 mg by mouth 2 (two) times daily with a meal.      . Cholecalciferol (VITAMIN D) 2000 UNITS tablet Take 2,000 Units by mouth at bedtime.      . Cholecalciferol (VITAMIN D-3) 5000 UNITS TABS Take 1 tablet by mouth  at bedtime.      . cyanocobalamin (,VITAMIN B-12,) 1000 MCG/ML injection Inject 1,000 mcg into the muscle every 30 (thirty) days.      . cyclobenzaprine (FLEXERIL) 10 MG tablet Take 1 tablet (10 mg total) by mouth 3 (three) times daily as needed. For muscle spasm  90 tablet  1  . HYDROcodone-acetaminophen (NORCO) 10-325 MG per tablet Take 1 tablet by mouth every 6 (six) hours as needed for pain.  30 tablet  3  . lisinopril (PRINIVIL,ZESTRIL) 2.5 MG tablet Take 2.5 mg by mouth every other day.      . metFORMIN (GLUCOPHAGE) 500 MG tablet Take 500 mg by mouth 2 (two) times daily with a meal.         . Multiple Vitamin (MULTIVITAMIN WITH MINERALS) TABS Take 1 tablet by mouth daily.      . pantoprazole (PROTONIX) 40 MG tablet Take 40 mg by mouth daily.        Bertram Gala Glycol-Propyl Glycol (SYSTANE OP) Place 2 drops into both eyes at bedtime.       Marland Kitchen PRESCRIPTION MEDICATION Inject into the vein every 7 (seven) days. Velcade and Cytoxan infusion q7d on Wednesdays.      . prochlorperazine (COMPAZINE) 10 MG tablet Take 1 tablet (10 mg total) by mouth every 6 (six) hours as needed. For nausea  30 tablet  3  . Sodium Fluoride (PREVIDENT DT) Place onto teeth at bedtime.      Marland Kitchen spironolactone (ALDACTONE) 25 MG tablet Take 25 mg by mouth every other day.       . darbepoetin (ARANESP) 300 MCG/0.6ML SOLN Inject 300 mcg into the skin as needed (given if needed for anemia).       No current facility-administered medications for this visit.   Facility-Administered Medications Ordered in Other Visits  Medication Dose Route Frequency Provider Last Rate Last Dose  . darbepoetin (ARANESP) injection 300 mcg  300 mcg Subcutaneous Once Samul Dada, MD         TREATMENT PROGRAM:  1. Velcade 2 mg subcutaneous every 2 weeks. Velcade was started on  06/14/2011.  2. Cytoxan 400 mg IV started on 08/29/2012.  3. Decadron 20 mg IV started on 08/29/2012.  4. Aranesp 300 mcg subcu every 2 weeks for hemoglobin less than or  equal to 10.  5. Vitamin B12 1000 mcg IM monthly restarted on 09/06/2011.  6. The patient received Zometa 3.5 mg IV from 02/14/2010 through  02/23/2012.    IMMUNIZATIONS:  1. Pneumovax was apparently given when the patient was 79 years old.  2. Flu shot was given on 01/27/2012.    SMOKING HISTORY: The patient has never smoked cigarettes.     HISTORY OF PRESENT ILLNESS:  We saw Christina Hopkins today for followup of her IgA kappa multiple myeloma.  [name] was accompanied by her daughter.   The patient continues to receive chemotherapy with Velcade, Cytoxan, and Decadron  every 2 weeks.  The patient also receives vitamin B12 injections every month.  She is no longer on Zometa.  The patient's overall condition is unchanged.  She received chemotherapy 2 weeks ago and apparently had some diarrhea for a few days.  She continues to have some chronic upper back pain without change.  The There are no new pains. Overall, the patient's condition seems to be more or less stable.  She is here for treatment today. Her myeloma markers looks slowly improving.   Blood pressure 115/47, pulse 80, temperature 98.1  F (36.7 C), temperature source Oral, resp. rate 18, height 4\' 9"  (1.448 m), weight 99 lb 11.2 oz (45.224 kg). PHYSICAL EXAMINATION:  General:  Patient shows no major changes.   HEENT:  There is no scleral icterus.  Mouth and pharynx are benign.  No peripheral adenopathy palpable.  Lungs: Decreased breath sounds and some rhonchi at the right base.  At the left base, there are some inspiratory rales.  Cardiac:  Normal.  The patient does not have a Port-A-Cath or central catheter.  Abdomen:  With the patient sitting, is benign.  There is marked dorsal kyphosis and a gibbus deformity in the region of the lower thoracic spine.  No peripheral edema.  The patient continues to have a lipoma involving the right lower proximal thigh.  Neurologic:  Nonfocal.  The patient is markedly stooped over and she continues to have tardive dyskinesia.  It will be recalled that we stopped Remeron thinking that this may have been a causative agent.  LABORATORY DATA:    CBC    Component Value Date/Time   WBC 4.8 12/11/2012 0820   WBC 3.7* 09/05/2012 1000   RBC 3.39* 12/11/2012 0820   RBC 2.74* 09/05/2012 1000   HGB 11.3* 12/11/2012 0820   HGB 9.1* 09/05/2012 1000   HCT 33.7* 12/11/2012 0820   HCT 25.9* 09/05/2012 1000   PLT 130* 12/11/2012 0820   PLT 120* 09/05/2012 1000   MCV 99.4 12/11/2012 0820   MCV 94.5 09/05/2012 1000   MCH 33.3 12/11/2012 0820   MCH 33.2 09/05/2012 1000   MCHC  33.5 12/11/2012 0820   MCHC 35.1 09/05/2012 1000   RDW 16.0* 12/11/2012 0820   RDW 16.6* 09/05/2012 1000   LYMPHSABS 0.6* 12/11/2012 0820   LYMPHSABS 1.1 09/05/2012 1000   MONOABS 0.4 12/11/2012 0820   MONOABS 0.6 09/05/2012 1000   EOSABS 0.0 12/11/2012 0820   EOSABS 0.1 09/05/2012 1000   BASOSABS 0.0 12/11/2012 0820   BASOSABS 0.0 09/05/2012 1000    Lab Results  Component Value Date   GLUCOSE 155* 11/13/2012   BUN 11.4 11/13/2012   CO2 29 11/13/2012   ALT 9 11/13/2012   AST 13 11/13/2012   LDH 148 11/13/2012   K 3.9 11/13/2012   CREATININE 0.8 11/13/2012    Results for Christina Hopkins, Christina Hopkins (MRN [id number]) as of 12/11/2012 08:53  Ref. Range 08/22/2012 08:41 09/12/2012 09:05 09/20/2012 08:56 10/18/2012 10:51 11/13/2012 10:56  Albumin ELP Latest Range: 55.8-66.1 %     54.4 (L)  COMMENT (PROTEIN ELECTROPHOR) No range found     *  Alpha-1-Globulin Latest Range: 2.9-4.9 %     5.5 (H)  Alpha-2-Globulin Latest Range: 7.1-11.8 %     13.4 (H)  Beta Globulin Latest Range: 4.7-7.2 %     5.4  Beta 2 Latest Range: 3.2-6.5 %     15.4 (H)  Gamma Globulin Latest Range: 11.1-18.8 %     5.9 (L)  M-SPIKE, % No range found     0.48  SPE Interp. No range found     *  IgG (Immunoglobin G), Serum Latest Range: 2103352080 mg/dL     253 (L)  IgA Latest Range: 69-380 mg/dL 6644 (H) 0347 (H) 425 (H) 1090 (H) 744 (H)  IgM, Serum Latest Range: 52-322 mg/dL     <5 (L)  Total Protein, serum electrophor Latest Range: 6.0-8.3 g/dL     5.9 (L)  Kappa free light chain Latest Range: 0.33-1.94 mg/dL 95.63 (H) 87.56 (H) 43.32 (H) 13.40 (H)  10.80 (H)  Lambda Free Lght Chn Latest Range: 0.57-2.63 mg/dL 1.61 0.96 0.45 (L) 4.09 1.14  Kappa:Lambda Ratio Latest Range: 0.26-1.65  14.77 (H) 16.36 (H) 38.11 (H) 20.94 (H) 9.47 (H)   IMAGING STUDIES:  1. Metastatic bone survey was on 08/05/2009. Prior to  that, we had a metastatic bone survey from 11/03/2008 and  07/23/2007. The metastatic bone survey from 08/05/2009 stated that  there were no findings to  strongly suggest metastatic disease.  There were multiple sites of degenerative change.  2. Chest x-ray from 02/09/2010 showed new or increased T6 and T7  compression fractures compared with the skeletal survey of  08/05/2009.  3. CT scan of head without IV contrast showed no acute findings. The  calvarium was intact. There was a probable old lacunar infarct in  the right basal ganglia.  4. There are x-rays of the left hip and lumbar spine from 07/16/2010.  There are x-rays of the left humerus and cervical spine from  06/30/2010.  5. An ultrasound of the right lower extremity was carried out on 08/18/2010  and did not show a discrete mass in the right thigh.  6. Screening mammogram from 02/01/2011 carried out at [facility] was negative.  7. MRI of the lumbar spine with and without IV contrast on 08/27/2011  showed acute/subacute superior endplate fracture of L5 with minimal  loss of vertebral body height and no significant retropulsion.  There was a nonhealed superior endplate compression fracture at T11  and T12 with associated kyphosis. There was central canal stenosis  greatest at L4-5, foraminal stenosis greatest at L2-3 and L3-4, and  nonhealed sacral insufficiency fractures. There was multilevel  spondylosis of the lumbar spine.  8. Kyphoplasty at L5, T11 and T12 carried out on 10/07/2011 by Dr.  Julieanne Cotton was carried out successfully. Biopsies showed  blood clot and hypercellular marrow with increased plasma cell  infiltrates at L5, T11 and T12.  9. Chest x-ray, 2 view, from 05/04/2012 showed enlargement of the  cardiac silhouette and evidence of CABG. There were chronic  bronchitic changes with left basilar scarring. Osteoporosis with  multiple thoracic spine compression fractures was present. There  were no acute abnormalities.  10. X-rays of thoracic spine, 2 view, from 05/04/2012 showed evidence  of mid T3 and L1 compression fractures which are new since  2011 and  April 2013 respectively. There was interval augmentation of T11  and T12 fractures as well as chronic T6 and T7 compression  fractures.  11. CT of the head without IV contrast on 05/04/2012 showed no acute  intracranial abnormalities and no significant changes compared with  the head CT from 07/16/2010.  12. MRI of the thoracic spine without IV contrast on 05/05/2012  confirmed the acute/subacute inferior endplate compression fracture  of C3 with approximately 25% loss of height. The remote inferior  endplate fractures at T6 and T7 demonstrate some residual edema  suggesting these are incompletely healed or have continued to  fracture. There was a superior endplate compression fracture at  T10 with minimal loss of height. There was evidence of vertebral  augmentation at T11 and T12 without residual edema. There was mild  spondylosis of the thoracic spine and small bilateral pleural  effusions with associated atelectasis.  13. Swallowing evaluation on 06/04/2012 showed severe oral phase  dysphagia, severe pharyngeal phase dysphagia and moderate cervical  esophageal phase dysphagia. Aspiration of secretions and liquids  were noted.  14. CT scan of the neck with  IV contrast on 07/12/2012 showed lateral  deviation of the left vocal cord compatible with paralysis. There  was no focal lesion along the course of the recurrent laryngeal  nerve to explain the finding. There was no primary mucosal  neoplasm or evidence of metastatic disease. There was extensive  atherosclerotic change without significant stenosis. There was  moderate spondylosis of the cervical spine. There was an 8 mm  obstructing stone in the proximal right submandibular duct with  chronic atrophy of the right submandibular gland.  15. MRI of the brain without IV contrast on 07/15/2012  showed mild changes of age appropriate and chronic microvascular  ischemia.  16. MRA of the head carried out on 07/15/2012 at  [facility] showed no significant stenosis of the medium to large size  intracranial vessels. There was persistent bilateral fetal origin of  both posterior cerebral arteries and is felt to be a benign variant. 17. CT scan of the chest with IV contrast on 07/25/2012 showed no  mediastinal mass or other etiology to explain the presence of the  patient's left vocal cord paralysis. There has been slight interval  progression of T10 superior endplate compression deformity.  18. Acute abdomen series (abdomen 2 view and chest 1 view) on 09/05/2012  showed nonobstructive bowel gas pattern with colonic air fluid levels.  Although nonspecific, fluid within the colon is abnormal. This is most  commonly associated with enteric infection.  19. Metastatic bone survey carried out on 11/01/2012 shows osseous demineralization with multiple thoracolumbar compression fractures and prior vertebroplasties.  There was an inferior endplate compression deformity at L3 that was new since 2011, though of uncertain age.  No definite destructive lytic lesion was identified to suggest new multiple myelomatous sites.  There were scattered degenerative changes and a small hiatal hernia as well as extensive atherosclerotic disease.  There was a questionable right obturator hernia.   IMPRESSION AND PLAN:  Clinically, Christina Hopkins seems to be doing well and tolerating her current treatment program without any major problems, with the exception of some diarrhea.  Her IgA level and serum light chains have improved after we changed her treatment.  Today, we will go ahead with chemotherapy as planned, Velcade 2 mg subcutaneous, Cytoxan 400 mg, IV Decadron 20 mg IV.  The patient doesn't need Aranesp today.    The patient is scheduled for CBC and another cycle of chemotherapy on August 12th.  We are treating her every 2 weeks at the present time.  She will come in for labs, chemotherapy, and physician  appointment on January 08, 2013.  Labs will consist of CBC, chemistries, IgA level, and serum light chains.  Again, the patient will continue to receive vitamin B12 1000 mcg IM every month and Aranesp 300 mcg subcu every 2 weeks as needed for hemoglobin less than 10.   Zachery Dakins, MD 12/11/2012 9:22 AM

## 2012-12-11 NOTE — Patient Instructions (Signed)
[facility] Discharge Instructions for Patients Receiving Chemotherapy  Today you received the following chemotherapy agents Cytoxan/Velcade To help prevent nausea and vomiting after your treatment, we encourage you to take your nausea medication as prescribed.  If you develop nausea and vomiting that is not controlled by your nausea medication, call the clinic.   BELOW ARE SYMPTOMS THAT SHOULD BE REPORTED IMMEDIATELY:  *FEVER GREATER THAN 100.5 F  *CHILLS WITH OR WITHOUT FEVER  NAUSEA AND VOMITING THAT IS NOT CONTROLLED WITH YOUR NAUSEA MEDICATION  *UNUSUAL SHORTNESS OF BREATH  *UNUSUAL BRUISING OR BLEEDING  TENDERNESS IN MOUTH AND THROAT WITH OR WITHOUT PRESENCE OF ULCERS  *URINARY PROBLEMS  *BOWEL PROBLEMS  UNUSUAL RASH Items with * indicate a potential emergency and should be followed up as soon as possible.  Feel free to call the clinic you have any questions or concerns. The clinic phone number is [phone].    

## 2012-12-25 ENCOUNTER — Other Ambulatory Visit: Payer: Self-pay | Admitting: Hematology and Oncology

## 2012-12-25 ENCOUNTER — Ambulatory Visit: Payer: Medicare Other

## 2012-12-25 ENCOUNTER — Other Ambulatory Visit (HOSPITAL_BASED_OUTPATIENT_CLINIC_OR_DEPARTMENT_OTHER): Payer: Medicare Other | Admitting: Lab

## 2012-12-25 ENCOUNTER — Ambulatory Visit (HOSPITAL_BASED_OUTPATIENT_CLINIC_OR_DEPARTMENT_OTHER): Payer: Medicare Other

## 2012-12-25 VITALS — BP 121/52 | HR 78 | Temp 97.2°F | Resp 18

## 2012-12-25 DIAGNOSIS — C9000 Multiple myeloma not having achieved remission: Secondary | ICD-10-CM

## 2012-12-25 DIAGNOSIS — Z5112 Encounter for antineoplastic immunotherapy: Secondary | ICD-10-CM

## 2012-12-25 DIAGNOSIS — Z5111 Encounter for antineoplastic chemotherapy: Secondary | ICD-10-CM

## 2012-12-25 LAB — CBC WITH DIFFERENTIAL/PLATELET
Basophils Absolute: 0 10*3/uL (ref 0.0–0.1)
EOS%: 0.8 % (ref 0.0–7.0)
Eosinophils Absolute: 0 10*3/uL (ref 0.0–0.5)
HCT: 33.1 % — ABNORMAL LOW (ref 34.8–46.6)
HGB: 11.2 g/dL — ABNORMAL LOW (ref 11.6–15.9)
MCH: 33.3 pg (ref 25.1–34.0)
MCV: 98.5 fL (ref 79.5–101.0)
NEUT#: 2.5 10*3/uL (ref 1.5–6.5)
NEUT%: 67.8 % (ref 38.4–76.8)
RDW: 15.8 % — ABNORMAL HIGH (ref 11.2–14.5)
lymph#: 0.7 10*3/uL — ABNORMAL LOW (ref 0.9–3.3)

## 2012-12-25 MED ORDER — DEXAMETHASONE SODIUM PHOSPHATE 20 MG/5ML IJ SOLN
20.0000 mg | Freq: Once | INTRAMUSCULAR | Status: AC
  Administered 2012-12-25: 20 mg via INTRAVENOUS

## 2012-12-25 MED ORDER — ONDANSETRON 8 MG/50ML IVPB (CHCC)
8.0000 mg | Freq: Once | INTRAVENOUS | Status: AC
  Administered 2012-12-25: 8 mg via INTRAVENOUS

## 2012-12-25 MED ORDER — SODIUM CHLORIDE 0.9 % IV SOLN
400.0000 mg | Freq: Once | INTRAVENOUS | Status: AC
  Administered 2012-12-25: 400 mg via INTRAVENOUS
  Filled 2012-12-25: qty 20

## 2012-12-25 MED ORDER — BORTEZOMIB CHEMO SQ INJECTION 3.5 MG (2.5MG/ML)
1.3000 mg/m2 | Freq: Once | INTRAMUSCULAR | Status: AC
  Administered 2012-12-25: 2 mg via SUBCUTANEOUS
  Filled 2012-12-25: qty 2

## 2012-12-25 NOTE — Patient Instructions (Addendum)
[facility] Discharge Instructions for Patients Receiving Chemotherapy  Today you received the following chemotherapy agents: Velcade, Cytoxan.  To help prevent nausea and vomiting after your treatment, we encourage you to take your nausea medication.  If you develop nausea and vomiting that is not controlled by your nausea medication, call the clinic.   BELOW ARE SYMPTOMS THAT SHOULD BE REPORTED IMMEDIATELY:  *FEVER GREATER THAN 100.5 F  *CHILLS WITH OR WITHOUT FEVER  NAUSEA AND VOMITING THAT IS NOT CONTROLLED WITH YOUR NAUSEA MEDICATION  *UNUSUAL SHORTNESS OF BREATH  *UNUSUAL BRUISING OR BLEEDING  TENDERNESS IN MOUTH AND THROAT WITH OR WITHOUT PRESENCE OF ULCERS  *URINARY PROBLEMS  *BOWEL PROBLEMS  UNUSUAL RASH Items with * indicate a potential emergency and should be followed up as soon as possible.  Feel free to call the clinic you have any questions or concerns. The clinic phone number is [phone].    

## 2012-12-25 NOTE — Progress Notes (Signed)
Injection given with infusion.

## 2013-01-01 ENCOUNTER — Telehealth: Payer: Self-pay | Admitting: Hematology and Oncology

## 2013-01-01 NOTE — Telephone Encounter (Signed)
, °

## 2013-01-08 ENCOUNTER — Ambulatory Visit: Payer: Medicare Other

## 2013-01-08 ENCOUNTER — Telehealth: Payer: Self-pay | Admitting: Hematology and Oncology

## 2013-01-08 ENCOUNTER — Telehealth: Payer: Self-pay | Admitting: *Deleted

## 2013-01-08 ENCOUNTER — Ambulatory Visit (HOSPITAL_BASED_OUTPATIENT_CLINIC_OR_DEPARTMENT_OTHER): Payer: Medicare Other

## 2013-01-08 ENCOUNTER — Other Ambulatory Visit (HOSPITAL_BASED_OUTPATIENT_CLINIC_OR_DEPARTMENT_OTHER): Payer: Medicare Other | Admitting: Lab

## 2013-01-08 ENCOUNTER — Ambulatory Visit (HOSPITAL_BASED_OUTPATIENT_CLINIC_OR_DEPARTMENT_OTHER): Payer: Medicare Other | Admitting: Hematology and Oncology

## 2013-01-08 VITALS — BP 108/50 | HR 75 | Temp 98.0°F | Resp 18 | Ht <= 58 in | Wt 100.5 lb

## 2013-01-08 DIAGNOSIS — C9000 Multiple myeloma not having achieved remission: Secondary | ICD-10-CM

## 2013-01-08 DIAGNOSIS — Z5112 Encounter for antineoplastic immunotherapy: Secondary | ICD-10-CM

## 2013-01-08 DIAGNOSIS — D519 Vitamin B12 deficiency anemia, unspecified: Secondary | ICD-10-CM

## 2013-01-08 DIAGNOSIS — D518 Other vitamin B12 deficiency anemias: Secondary | ICD-10-CM

## 2013-01-08 LAB — COMPREHENSIVE METABOLIC PANEL (CC13)
ALT: 16 U/L (ref 0–55)
AST: 16 U/L (ref 5–34)
Albumin: 3.5 g/dL (ref 3.5–5.0)
Alkaline Phosphatase: 52 U/L (ref 40–150)
BUN: 6.9 mg/dL — ABNORMAL LOW (ref 7.0–26.0)
Calcium: 9.4 mg/dL (ref 8.4–10.4)
Chloride: 99 mEq/L (ref 98–109)
Potassium: 4.5 mEq/L (ref 3.5–5.1)
Sodium: 135 mEq/L — ABNORMAL LOW (ref 136–145)
Total Protein: 6.6 g/dL (ref 6.4–8.3)

## 2013-01-08 LAB — CBC WITH DIFFERENTIAL/PLATELET
BASO%: 0.5 % (ref 0.0–2.0)
EOS%: 0.9 % (ref 0.0–7.0)
HCT: 33.4 % — ABNORMAL LOW (ref 34.8–46.6)
LYMPH%: 16.3 % (ref 14.0–49.7)
MCH: 33 pg (ref 25.1–34.0)
MCHC: 33.2 g/dL (ref 31.5–36.0)
MONO#: 0.6 10*3/uL (ref 0.1–0.9)
NEUT%: 69.9 % (ref 38.4–76.8)
RBC: 3.36 10*6/uL — ABNORMAL LOW (ref 3.70–5.45)
WBC: 4.4 10*3/uL (ref 3.9–10.3)
lymph#: 0.7 10*3/uL — ABNORMAL LOW (ref 0.9–3.3)
nRBC: 0 % (ref 0–0)

## 2013-01-08 MED ORDER — SODIUM CHLORIDE 0.9 % IV SOLN
INTRAVENOUS | Status: DC
  Administered 2013-01-08: 11:00:00 via INTRAVENOUS

## 2013-01-08 MED ORDER — DEXAMETHASONE SODIUM PHOSPHATE 20 MG/5ML IJ SOLN
20.0000 mg | Freq: Once | INTRAMUSCULAR | Status: AC
  Administered 2013-01-08: 20 mg via INTRAVENOUS

## 2013-01-08 MED ORDER — BORTEZOMIB CHEMO SQ INJECTION 3.5 MG (2.5MG/ML)
1.7500 mg | Freq: Once | INTRAMUSCULAR | Status: AC
  Administered 2013-01-08: 1.75 mg via SUBCUTANEOUS
  Filled 2013-01-08: qty 1.75

## 2013-01-08 MED ORDER — CYANOCOBALAMIN 1000 MCG/ML IJ SOLN
1000.0000 ug | Freq: Once | INTRAMUSCULAR | Status: AC
  Administered 2013-01-08: 1000 ug via INTRAMUSCULAR

## 2013-01-08 MED ORDER — SODIUM CHLORIDE 0.9 % IV SOLN
400.0000 mg | Freq: Once | INTRAVENOUS | Status: AC
  Administered 2013-01-08: 400 mg via INTRAVENOUS
  Filled 2013-01-08: qty 20

## 2013-01-08 MED ORDER — ONDANSETRON 8 MG/50ML IVPB (CHCC)
8.0000 mg | Freq: Once | INTRAVENOUS | Status: AC
  Administered 2013-01-08: 8 mg via INTRAVENOUS

## 2013-01-08 NOTE — Patient Instructions (Addendum)
[facility] Discharge Instructions for Patients Receiving Chemotherapy  Today you received the following chemotherapy agents: velcade, cytoxan  To help prevent nausea and vomiting after your treatment, we encourage you to take your nausea medication.  Take it as often as prescribed.     If you develop nausea and vomiting that is not controlled by your nausea medication, call the clinic. If it is after clinic hours your family physician or the after hours number for the clinic or go to the Emergency Department.   BELOW ARE SYMPTOMS THAT SHOULD BE REPORTED IMMEDIATELY:  *FEVER GREATER THAN 100.5 F  *CHILLS WITH OR WITHOUT FEVER  NAUSEA AND VOMITING THAT IS NOT CONTROLLED WITH YOUR NAUSEA MEDICATION  *UNUSUAL SHORTNESS OF BREATH  *UNUSUAL BRUISING OR BLEEDING  TENDERNESS IN MOUTH AND THROAT WITH OR WITHOUT PRESENCE OF ULCERS  *URINARY PROBLEMS  *BOWEL PROBLEMS  UNUSUAL RASH Items with * indicate a potential emergency and should be followed up as soon as possible.  Feel free to call the clinic you have any questions or concerns. The clinic phone number is [phone].   I have been informed and understand all the instructions given to me. I know to contact the clinic, my physician, or go to the Emergency Department if any problems should occur. I do not have any questions at this time, but understand that I may call the clinic during office hours   should I have any questions or need assistance in obtaining follow up care.    __________________________________________  _____________  __________ Signature of Patient or Authorized Representative            Date                   Time    __________________________________________ Nurse's Signature    

## 2013-01-08 NOTE — Telephone Encounter (Signed)
gv and printed appt sched and avs fort pt...emaild MW to add tx.

## 2013-01-08 NOTE — Progress Notes (Signed)
Work excuse note given to Dian Situ, patient's daughter today.

## 2013-01-08 NOTE — Telephone Encounter (Signed)
Per staff message and POF I have scheduled appts.  [name]  

## 2013-01-09 NOTE — Progress Notes (Signed)
ID: Christina Hopkins OB: 1925-05-24  MR#: [id number]  WUJ#:[id number]  [facility]  [phone] [phone]   OFFICE PROGRESS NOTE  PCP: Christina Blamer, MD  DIAGNOSIS:  1.  Multiple myeloma. 2. Anemia secondary to vitamin B12 deficiency.   PAST THERAPY:  Revlimid   (08/2009-08/2012) Zometa    (02/2010 - 02/2012) Velcade    (05/2011 -present) Cytoxan    08/2012 - present) Decadron (08/2012 - present)  CURRENT THERAPY:  1.  Velcade, Cytoxan and Decadron every 2 weeks from 08/29/2012. 2.  Vitamin B12  mg IM monthly. 3.  Aranesp 300 mcg subcu every 2 weeks for hemoglobin less than or equal to 10.    HISTORY OF PRESENT ILLNESS: 1. Multiple myeloma, initially presenting as an IgA kappa monoclonal gammopathy in February 2009. 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 This was felt to be an adverse prognostic  determinant. Bone marrow on 08/31/2007 showed 46% plasma cells. A repeat bone marrow on 08/05/2009 showed 73% plasma cells. Treatment with Revlimid was started in April 2011. Zometa was started in October 2011 and 2 years of treatment were concluded on 02/23/2012. We had previously started Aranesp for the patient's anemia in May of 2010. Most recent metastatic bone  survey was on 08/05/2009. Other x-rays since then are available. Maximum IgA level was 2080 on 08/11/2009. Subcutaneous Velcade was added to the patient's treatment program on 06/14/2011 because of a rising IgA level 1080 on 06/02/2011. The patient had been receiving Velcade every 2 weeks in combination with Revlimid during the early months of 2014. The patient continues to demonstrate progressive disease as evidenced by rising IgA and serum kappa levels. Revlimid was discontinued in April 2014. The patient had received Revlimid for about 3 years from April 2011 through April 2014. As of 08/29/2012, the patient is now receiving Velcade, Cytoxan and Decadron every 2 weeks. The patient has also  been receiving Aranesp 300 mcg subcu every 2 weeks for hemoglobin less than or equal to 10.  2. Anemia secondary to vitamin B12 deficiency.  The patient had been on oral vitamin B12. However, her vitamin B12 level fell to 284 on 07/26/2011 and vitamin B12 shots 1000 mcg given IM every month was resumed on 09/06/2011.  3. Anemia secondary to iron deficiency.   INTERVAL HISTORY: The patient is a 79 y.o. female who presented for follow up visit. Her appetite and weight is stable. Seldom she has headache. Patient has double and blurry vision and will have cataract surgery in September. She has nasal congestion with clear nasal discharge and diminished hearing. Sometimes she has dyspnea and dry cough. Christina Hopkins complaints about bloating and nausea. Occasionally she has diarrhea for which he takes Imodium. She reported pain in back,neck and shoulders; numbness in fingers and feet, and dry skin. The patient denied fever, chills, night sweats, change in appetite or weight. She denied odynophagia or dysphagia. No chest pain, palpitations, abdominal pain,  vomiting,constipation, hematochezia. The patient denied dysuria, nocturia, polyuria, hematuria, myalgia, tingling, psychiatric problems.  Review of Systems  Constitutional: Negative for fever, chills, weight loss, malaise/fatigue and diaphoresis.  HENT: Positive for hearing loss, congestion and neck pain. Negative for ear pain, nosebleeds, sore throat and tinnitus.   Eyes: Positive for blurred vision and double vision. Negative for photophobia and pain.  Respiratory: Positive for cough and shortness of breath.   Cardiovascular: Negative for chest pain, palpitations, orthopnea, claudication and PND.  Gastrointestinal: Positive for nausea and diarrhea. Negative for heartburn, vomiting,  abdominal pain, constipation, blood in stool and melena.  Genitourinary: Negative for dysuria, urgency, frequency and hematuria.  Musculoskeletal: Positive for back pain and  joint pain. Negative for myalgias.  Skin: Negative for itching and rash.  Neurological: Positive for sensory change and headaches. Negative for dizziness, tingling, tremors, focal weakness, seizures and weakness.  Endo/Heme/Allergies: Does not bruise/bleed easily.  Psychiatric/Behavioral: Negative.    PAST MEDICAL HISTORY: Past Medical History  Diagnosis Date  . Hypertension   . Diabetes mellitus   . Anemia   . Osteoporosis   . Dyslipidemia   . Myocardial infarction   . colon ca dx'd 2003    surg only  . Multiple myeloma dx'd 2009    oral chemo ongoing  . Coronary artery disease   . Shortness of breath   . GERD (gastroesophageal reflux disease)   . Wears glasses     PAST SURGICAL HISTORY: Past Surgical History  Procedure Laterality Date  . Coronary artery bypass graft  08/2001    4 vessel  . Colon surgery  2003  . Tonsillectomy    . Appendectomy    . Abdominal hysterectomy    . Back surgery    . Eye surgery      cataracts  . Direct laryngoscopy with radiaesse injection Left 08/13/2012    Procedure: LEFT VOCAL CORD RADIESSE AUGMENTATION LARYNGOSCOPY ;  Surgeon: Christina Shanks, MD;  Location: [facility];  Service: ENT;  Laterality: Left;    FAMILY HISTORY Family History  Problem Relation Age of Onset  . Heart disease Brother   . Diabetes Brother     HEALTH MAINTENANCE: History  Substance Use Topics  . Smoking status: Never Smoker   . Smokeless tobacco: Never Used  . Alcohol Use: No    Allergies  Allergen Reactions  . Gemfibrozil Nausea And Vomiting  . Nifedipine Hives  . Questran [Cholestyramine] Other (See Comments)    Do not remember    Current Outpatient Prescriptions  Medication Sig Dispense Refill  . acetaminophen (TYLENOL) 650 MG CR tablet Take 650 mg by mouth every 8 (eight) hours as needed for pain.      Marland Kitchen acyclovir (ZOVIRAX) 400 MG tablet Take 1 tablet (400 mg total) by mouth 2 (two) times daily.  60 tablet  3  . Alum & Mag  Hydroxide-Simeth (MAGIC MOUTHWASH) SOLN Swish and spit 5 mLs 4 (four) times daily as needed (for mouth ulcers).       Marland Kitchen aspirin EC 81 MG tablet Take 81 mg by mouth daily.      Marland Kitchen atorvastatin (LIPITOR) 40 MG tablet Take 40 mg by mouth daily.       . carvedilol (COREG) 25 MG tablet Take 25 mg by mouth 2 (two) times daily with a meal.      . Cholecalciferol (VITAMIN D) 2000 UNITS tablet Take 2,000 Units by mouth at bedtime.      . Cholecalciferol (VITAMIN D-3) 5000 UNITS TABS Take 1 tablet by mouth at bedtime.      . cyanocobalamin (,VITAMIN B-12,) 1000 MCG/ML injection Inject 1,000 mcg into the muscle every 30 (thirty) days.      . cyclobenzaprine (FLEXERIL) 10 MG tablet Take 1 tablet (10 mg total) by mouth 3 (three) times daily as needed. For muscle spasm  90 tablet  1  . darbepoetin (ARANESP) 300 MCG/0.6ML SOLN Inject 300 mcg into the skin as needed (given if needed for anemia).      Marland Kitchen HYDROcodone-acetaminophen (NORCO) 10-325 MG per tablet  Take 1 tablet by mouth every 6 (six) hours as needed for pain.  30 tablet  3  . lisinopril (PRINIVIL,ZESTRIL) 2.5 MG tablet Take 2.5 mg by mouth every other day.      . metFORMIN (GLUCOPHAGE) 500 MG tablet Take 500 mg by mouth 2 (two) times daily with a meal.        . Multiple Vitamin (MULTIVITAMIN WITH MINERALS) TABS Take 1 tablet by mouth daily.      . pantoprazole (PROTONIX) 40 MG tablet Take 40 mg by mouth daily.        Bertram Gala Glycol-Propyl Glycol (SYSTANE OP) Place 2 drops into both eyes at bedtime.       Marland Kitchen PRESCRIPTION MEDICATION Inject into the vein every 7 (seven) days. Velcade and Cytoxan infusion q7d on Wednesdays.      . prochlorperazine (COMPAZINE) 10 MG tablet Take 1 tablet (10 mg total) by mouth every 6 (six) hours as needed. For nausea  30 tablet  3  . Sodium Fluoride (PREVIDENT DT) Place onto teeth at bedtime.      Marland Kitchen spironolactone (ALDACTONE) 25 MG tablet Take 25 mg by mouth every other day.        No current facility-administered  medications for this visit.   Facility-Administered Medications Ordered in Other Visits  Medication Dose Route Frequency Provider Last Rate Last Dose  . darbepoetin (ARANESP) injection 300 mcg  300 mcg Subcutaneous Once Samul Dada, MD        OBJECTIVE: Filed Vitals:   01/08/13 0838  BP: 108/50  Pulse: 75  Temp: 98 F (36.7 C)  Resp: 18     Body mass index is 21.74 kg/(m^2).    ECOG FS:  PHYSICAL EXAMINATION:  HEENT: Sclerae anicteric.  Conjunctivae were pink. Pupils round and reactive bilaterally. Oral mucosa is moist without ulceration or thrush. Diminished hearing on left. No occipital, submandibular, cervical, supraclavicular or axillar adenopathy. Lungs: clear to auscultation without wheezes. No rales or rhonchi. Heart: regular rate and rhythm. No murmur, gallop or rubs. Abdomen: soft, non tender. No guarding or rebound tenderness. Bowel sounds are present. No palpable hepatosplenomegaly. MSK: no focal spinal tenderness. Extremities: No clubbing or cyanosis.No calf tenderness to palpitation, no peripheral edema. The patient had grossly intact strength in upper and lower extremities. Skin exam was without ecchymosis, petechiae. Neuro: non-focal, alert and oriented to time, person and place, appropriate affect  LAB RESULTS:  CMP     Component Value Date/Time   NA 135* 01/08/2013 0818   NA 124* 09/05/2012 1000   K 4.5 01/08/2013 0818   K 3.9 09/05/2012 1000   CL 101 10/18/2012 1051   CL 89* 09/05/2012 1000   CO2 27 01/08/2013 0818   CO2 25 09/05/2012 1000   GLUCOSE 123 01/08/2013 0818   GLUCOSE 153* 10/18/2012 1051   GLUCOSE 94 09/05/2012 1000   BUN 6.9* 01/08/2013 0818   BUN 27* 09/05/2012 1000   CREATININE 0.7 01/08/2013 0818   CREATININE 1.15* 09/05/2012 1000   CREATININE 0.69 08/11/2009 1145   CALCIUM 9.4 01/08/2013 0818   CALCIUM 8.9 09/05/2012 1000   PROT 6.6 01/08/2013 0818   PROT 6.9 09/05/2012 1000   ALBUMIN 3.5 01/08/2013 0818   ALBUMIN 3.1* 09/05/2012 1000   AST 16  01/08/2013 0818   AST 18 09/05/2012 1000   ALT 16 01/08/2013 0818   ALT 12 09/05/2012 1000   ALKPHOS 52 01/08/2013 0818   ALKPHOS 58 09/05/2012 1000   BILITOT 0.52 01/08/2013 0818  BILITOT 0.5 09/05/2012 1000   GFRNONAA 42* 09/05/2012 1000   GFRAA 49* 09/05/2012 1000    Lab Results  Component Value Date   WBC 4.4 01/08/2013   NEUTROABS 3.1 01/08/2013   HGB 11.1* 01/08/2013   HCT 33.4* 01/08/2013   MCV 99.4 01/08/2013   PLT 160 01/08/2013      Chemistry      Component Value Date/Time   NA 135* 01/08/2013 0818   NA 124* 09/05/2012 1000   K 4.5 01/08/2013 0818   K 3.9 09/05/2012 1000   CL 101 10/18/2012 1051   CL 89* 09/05/2012 1000   CO2 27 01/08/2013 0818   CO2 25 09/05/2012 1000   BUN 6.9* 01/08/2013 0818   BUN 27* 09/05/2012 1000   CREATININE 0.7 01/08/2013 0818   CREATININE 1.15* 09/05/2012 1000   CREATININE 0.69 08/11/2009 1145      Component Value Date/Time   CALCIUM 9.4 01/08/2013 0818   CALCIUM 8.9 09/05/2012 1000   ALKPHOS 52 01/08/2013 0818   ALKPHOS 58 09/05/2012 1000   AST 16 01/08/2013 0818   AST 18 09/05/2012 1000   ALT 16 01/08/2013 0818   ALT 12 09/05/2012 1000   BILITOT 0.52 01/08/2013 0818   BILITOT 0.5 09/05/2012 1000      ASSESSMENT AND PLAN:  I: Multiple myeloma. Her IgA level and serum light chains continue to improve on current regiment. We will proceed with chemotherapy with Velcade subcutaneous, Cytoxan 400 mg, IV Decadron 20 mg IV.  2. Anemia Vitamin B12 deficiency. We will give vitamin B12 IM The patient doesn't need Aranesp today.  3. Follow up in 2 weeks  for evaluation for another treatment with CBC, CMET, IgA level, and serum light chains.   Myra Rude, MD   01/09/2013 6:11 PM

## 2013-01-10 ENCOUNTER — Telehealth: Payer: Self-pay | Admitting: *Deleted

## 2013-01-10 ENCOUNTER — Telehealth: Payer: Self-pay | Admitting: Hematology and Oncology

## 2013-01-10 LAB — SPEP & IFE WITH QIG
Albumin ELP: 58.7 % (ref 55.8–66.1)
Alpha-1-Globulin: 5.3 % — ABNORMAL HIGH (ref 2.9–4.9)
Beta 2: 11.4 % — ABNORMAL HIGH (ref 3.2–6.5)
Beta Globulin: 5.6 % (ref 4.7–7.2)
Gamma Globulin: 5.4 % — ABNORMAL LOW (ref 11.1–18.8)
IgA: 505 mg/dL — ABNORMAL HIGH (ref 69–380)
IgM, Serum: <5 mg/dL — ABNORMAL LOW (ref 52–322)

## 2013-01-10 LAB — KAPPA/LAMBDA LIGHT CHAINS: Kappa:Lambda Ratio: 10.68 — ABNORMAL HIGH (ref 0.26–1.65)

## 2013-01-10 NOTE — Telephone Encounter (Signed)
Per staff message and POF I have scheduled appts.  [name]  

## 2013-01-10 NOTE — Telephone Encounter (Signed)
lvm for pt regarding to all sept appts... °

## 2013-01-19 ENCOUNTER — Encounter: Payer: Self-pay | Admitting: *Deleted

## 2013-01-22 ENCOUNTER — Telehealth: Payer: Self-pay | Admitting: Internal Medicine

## 2013-01-22 ENCOUNTER — Ambulatory Visit (HOSPITAL_BASED_OUTPATIENT_CLINIC_OR_DEPARTMENT_OTHER): Payer: Medicare Other

## 2013-01-22 ENCOUNTER — Ambulatory Visit (HOSPITAL_BASED_OUTPATIENT_CLINIC_OR_DEPARTMENT_OTHER): Payer: Medicare Other | Admitting: Internal Medicine

## 2013-01-22 ENCOUNTER — Other Ambulatory Visit (HOSPITAL_BASED_OUTPATIENT_CLINIC_OR_DEPARTMENT_OTHER): Payer: Medicare Other | Admitting: Lab

## 2013-01-22 ENCOUNTER — Telehealth: Payer: Self-pay | Admitting: *Deleted

## 2013-01-22 ENCOUNTER — Ambulatory Visit: Payer: Medicare Other

## 2013-01-22 VITALS — BP 89/63 | HR 78 | Temp 98.8°F | Resp 18 | Ht <= 58 in | Wt 99.5 lb

## 2013-01-22 DIAGNOSIS — D519 Vitamin B12 deficiency anemia, unspecified: Secondary | ICD-10-CM

## 2013-01-22 DIAGNOSIS — D649 Anemia, unspecified: Secondary | ICD-10-CM

## 2013-01-22 DIAGNOSIS — R52 Pain, unspecified: Secondary | ICD-10-CM

## 2013-01-22 DIAGNOSIS — R110 Nausea: Secondary | ICD-10-CM

## 2013-01-22 DIAGNOSIS — E538 Deficiency of other specified B group vitamins: Secondary | ICD-10-CM

## 2013-01-22 DIAGNOSIS — C9000 Multiple myeloma not having achieved remission: Secondary | ICD-10-CM

## 2013-01-22 DIAGNOSIS — R209 Unspecified disturbances of skin sensation: Secondary | ICD-10-CM

## 2013-01-22 DIAGNOSIS — Z5112 Encounter for antineoplastic immunotherapy: Secondary | ICD-10-CM

## 2013-01-22 DIAGNOSIS — Z5111 Encounter for antineoplastic chemotherapy: Secondary | ICD-10-CM

## 2013-01-22 LAB — CBC WITH DIFFERENTIAL/PLATELET
BASO%: 0.5 % (ref 0.0–2.0)
MCHC: 33.5 g/dL (ref 31.5–36.0)
MONO#: 0.4 10*3/uL (ref 0.1–0.9)
NEUT#: 2.5 10*3/uL (ref 1.5–6.5)
RBC: 3.29 10*6/uL — ABNORMAL LOW (ref 3.70–5.45)
WBC: 3.7 10*3/uL — ABNORMAL LOW (ref 3.9–10.3)
lymph#: 0.7 10*3/uL — ABNORMAL LOW (ref 0.9–3.3)

## 2013-01-22 LAB — COMPREHENSIVE METABOLIC PANEL (CC13)
ALT: 14 U/L (ref 0–55)
Albumin: 3.4 g/dL — ABNORMAL LOW (ref 3.5–5.0)
CO2: 28 mEq/L (ref 22–29)
Calcium: 9.4 mg/dL (ref 8.4–10.4)
Chloride: 100 mEq/L (ref 98–109)
Potassium: 4.4 mEq/L (ref 3.5–5.1)
Sodium: 135 mEq/L — ABNORMAL LOW (ref 136–145)
Total Protein: 6.2 g/dL — ABNORMAL LOW (ref 6.4–8.3)

## 2013-01-22 MED ORDER — DEXAMETHASONE SODIUM PHOSPHATE 20 MG/5ML IJ SOLN
INTRAMUSCULAR | Status: AC
  Filled 2013-01-22: qty 5

## 2013-01-22 MED ORDER — SODIUM CHLORIDE 0.9 % IV SOLN
400.0000 mg | Freq: Once | INTRAVENOUS | Status: AC
  Administered 2013-01-22: 400 mg via INTRAVENOUS
  Filled 2013-01-22: qty 20

## 2013-01-22 MED ORDER — SODIUM CHLORIDE 0.9 % IV SOLN
INTRAVENOUS | Status: DC
  Administered 2013-01-22: 10:00:00 via INTRAVENOUS

## 2013-01-22 MED ORDER — DEXAMETHASONE SODIUM PHOSPHATE 20 MG/5ML IJ SOLN
20.0000 mg | Freq: Once | INTRAMUSCULAR | Status: AC
  Administered 2013-01-22: 20 mg via INTRAVENOUS

## 2013-01-22 MED ORDER — ONDANSETRON 8 MG/NS 50 ML IVPB
INTRAVENOUS | Status: AC
  Filled 2013-01-22: qty 8

## 2013-01-22 MED ORDER — ONDANSETRON 8 MG/50ML IVPB (CHCC)
8.0000 mg | Freq: Once | INTRAVENOUS | Status: AC
  Administered 2013-01-22: 8 mg via INTRAVENOUS

## 2013-01-22 MED ORDER — BORTEZOMIB CHEMO SQ INJECTION 3.5 MG (2.5MG/ML)
1.7500 mg | Freq: Once | INTRAMUSCULAR | Status: AC
  Administered 2013-01-22: 1.75 mg via SUBCUTANEOUS
  Filled 2013-01-22: qty 1.75

## 2013-01-22 NOTE — Patient Instructions (Addendum)
Dexamethasone injection What is this medicine? DEXAMETHASONE (dex a METH a sone) is a corticosteroid. It is used to treat inflammation of the skin, joints, lungs, and other organs. Common conditions treated include asthma, allergies, and arthritis. It is also used for other conditions, like blood disorders and diseases of the adrenal glands. This medicine may be used for other purposes; ask your health care provider or pharmacist if you have questions. What should I tell my health care provider before I take this medicine? They need to know if you have any of these conditions: -blood clotting problems -Cushing's syndrome -diabetes -glaucoma -heart problems or disease -high blood pressure -infection like herpes, measles, tuberculosis, or chickenpox -kidney disease -liver disease -mental problems -myasthenia gravis -osteoporosis -previous heart attack -seizures -stomach, ulcer or intestine disease including colitis and diverticulitis -thyroid problem -an unusual or allergic reaction to dexamethasone, corticosteroids, other medicines, lactose, foods, dyes, or preservatives -pregnant or trying to get pregnant -breast-feeding How should I use this medicine? This medicine is for injection into a muscle, joint, lesion, soft tissue, or vein. It is given by a health care professional in a hospital or clinic setting. Talk to your pediatrician regarding the use of this medicine in children. Special care may be needed. Overdosage: If you think you have taken too much of this medicine contact a poison control center or emergency room at once. NOTE: This medicine is only for you. Do not share this medicine with others. What if I miss a dose? This may not apply. If you are having a series of injections over a prolonged period, try not to miss an appointment. Call your doctor or health care professional to reschedule if you are unable to keep an appointment. What may interact with this medicine? Do  not take this medicine with any of the following medications: -mifepristone, RU-486 -vaccines This medicine may also interact with the following medications: -amphotericin B -antibiotics like clarithromycin, erythromycin, and troleandomycin -aspirin and aspirin-like drugs -barbiturates like phenobarbital -carbamazepine -cholestyramine -cholinesterase inhibitors like donepezil, galantamine, rivastigmine, and tacrine -cyclosporine -digoxin -diuretics -ephedrine -female hormones, like estrogens or progestins and birth control pills -indinavir -isoniazid -ketoconazole -medicines for diabetes -medicines that improve muscle tone or strength for conditions like myasthenia gravis -NSAIDs, medicines for pain and inflammation, like ibuprofen or naproxen -phenytoin -rifampin -thalidomide -warfarin This list may not describe all possible interactions. Give your health care provider a list of all the medicines, herbs, non-prescription drugs, or dietary supplements you use. Also tell them if you smoke, drink alcohol, or use illegal drugs. Some items may interact with your medicine. What should I watch for while using this medicine? Your condition will be monitored carefully while you are receiving this medicine. If you are taking this medicine for a long time, carry an identification card with your name and address, the type and dose of your medicine, and your doctor's name and address. This medicine may increase your risk of getting an infection. Stay away from people who are sick. Tell your doctor or health care professional if you are around anyone with measles or chickenpox. Talk to your health care provider before you get any vaccines that you take this medicine. If you are going to have surgery, tell your doctor or health care professional that you have taken this medicine within the last twelve months. Ask your doctor or health care professional about your diet. You may need to lower the  amount of salt you eat. The medicine can increase your blood sugar. If you  are a diabetic check with your doctor if you need help adjusting the dose of your diabetic medicine. What side effects may I notice from receiving this medicine? Side effects that you should report to your doctor or health care professional as soon as possible: -allergic reactions like skin rash, itching or hives, swelling of the face, lips, or tongue -black or tarry stools -change in the amount of urine -changes in vision -confusion, excitement, restlessness, a false sense of well-being -fever, sore throat, sneezing, cough, or other signs of infection, wounds that will not heal -hallucinations -increased thirst -mental depression, mood swings, mistaken feelings of self importance or of being mistreated -pain in hips, back, ribs, arms, shoulders, or legs -pain, redness, or irritation at the injection site -redness, blistering, peeling or loosening of the skin, including inside the mouth -rounding out of face -swelling of feet or lower legs -unusual bleeding or bruising -unusual tired or weak -wounds that do not heal Side effects that usually do not require medical attention (report to your doctor or health care professional if they continue or are bothersome): -diarrhea or constipation -change in taste -headache -nausea, vomiting -skin problems, acne, thin and shiny skin -touble sleeping -unusual growth of hair on the face or body -weight gain This list may not describe all possible side effects. Call your doctor for medical advice about side effects. You may report side effects to FDA at 1-800-FDA-1088. Where should I keep my medicine? This drug is given in a hospital or clinic and will not be stored at home. NOTE: This sheet is a summary. It may not cover all possible information. If you have questions about this medicine, talk to your doctor, pharmacist, or health care provider.  2012, [employer]. (08/23/2007 2:04:12 PM)Cyclophosphamide injection What is this medicine? CYCLOPHOSPHAMIDE (sye kloe FOSS fa mide) is a chemotherapy drug. It slows the growth of cancer cells. This medicine is used to treat many types of cancer like lymphoma, myeloma, leukemia, breast cancer, and ovarian cancer, to name a few. It is also used to treat nephrotic syndrome in children. This medicine may be used for other purposes; ask your health care provider or pharmacist if you have questions. What should I tell my health care provider before I take this medicine? They need to know if you have any of these conditions: -blood disorders -history of other chemotherapy -history of radiation therapy -infection -kidney disease -liver disease -tumors in the bone marrow -an unusual or allergic reaction to cyclophosphamide, other chemotherapy, other medicines, foods, dyes, or preservatives -pregnant or trying to get pregnant -breast-feeding How should I use this medicine? This drug is usually given as an injection into a vein or muscle or by infusion into a vein. It is administered in a hospital or clinic by a specially trained health care professional. Talk to your pediatrician regarding the use of this medicine in children. While this drug may be prescribed for selected conditions, precautions do apply. Overdosage: If you think you have taken too much of this medicine contact a poison control center or emergency room at once. NOTE: This medicine is only for you. Do not share this medicine with others. What if I miss a dose? It is important not to miss your dose. Call your doctor or health care professional if you are unable to keep an appointment. What may interact with this medicine? Do not take this medicine with any of the following medications: -mibefradil -nalidixic acid This medicine may also interact with the following medications: -  doxorubicin -etanercept -medicines to increase blood counts like  filgrastim, pegfilgrastim, sargramostim -medicines that block muscle or nerve pain -St. John's Wort -phenobarbital -succinylcholine chloride -trastuzumab -vaccines Talk to your doctor or health care professional before taking any of these medicines: -acetaminophen -aspirin -ibuprofen -ketoprofen -naproxen This list may not describe all possible interactions. Give your health care provider a list of all the medicines, herbs, non-prescription drugs, or dietary supplements you use. Also tell them if you smoke, drink alcohol, or use illegal drugs. Some items may interact with your medicine. What should I watch for while using this medicine? Visit your doctor for checks on your progress. This drug may make you feel generally unwell. This is not uncommon, as chemotherapy can affect healthy cells as well as cancer cells. Report any side effects. Continue your course of treatment even though you feel ill unless your doctor tells you to stop. Drink water or other fluids as directed. Urinate often, even at night. In some cases, you may be given additional medicines to help with side effects. Follow all directions for their use. Call your doctor or health care professional for advice if you get a fever, chills or sore throat, or other symptoms of a cold or flu. Do not treat yourself. This drug decreases your body's ability to fight infections. Try to avoid being around people who are sick. This medicine may increase your risk to bruise or bleed. Call your doctor or health care professional if you notice any unusual bleeding. Be careful brushing and flossing your teeth or using a toothpick because you may get an infection or bleed more easily. If you have any dental work done, tell your dentist you are receiving this medicine. Avoid taking products that contain aspirin, acetaminophen, ibuprofen, naproxen, or ketoprofen unless instructed by your doctor. These medicines may hide a fever. Do not become  pregnant while taking this medicine. Women should inform their doctor if they wish to become pregnant or think they might be pregnant. There is a potential for serious side effects to an unborn child. Talk to your health care professional or pharmacist for more information. Do not breast-feed an infant while taking this medicine. Men should inform their doctor if they wish to father a child. This medicine may lower sperm counts. If you are going to have surgery, tell your doctor or health care professional that you have taken this medicine. What side effects may I notice from receiving this medicine? Side effects that you should report to your doctor or health care professional as soon as possible: -allergic reactions like skin rash, itching or hives, swelling of the face, lips, or tongue -low blood counts - this medicine may decrease the number of white blood cells, red blood cells and platelets. You may be at increased risk for infections and bleeding. -signs of infection - fever or chills, cough, sore throat, pain or difficulty passing urine -signs of decreased platelets or bleeding - bruising, pinpoint red spots on the skin, black, tarry stools, blood in the urine -signs of decreased red blood cells - unusually weak or tired, fainting spells, lightheadedness -breathing problems -dark urine -mouth sores -pain, swelling, redness at site where injected -swelling of the ankles, feet, hands -trouble passing urine or change in the amount of urine -weight gain -yellowing of the eyes or skin Side effects that usually do not require medical attention (report to your doctor or health care professional if they continue or are bothersome): -changes in nail or skin color -diarrhea -hair loss -  loss of appetite -missed menstrual periods -nausea, vomiting -stomach pain This list may not describe all possible side effects. Call your doctor for medical advice about side effects. You may report side  effects to FDA at 1-800-FDA-1088. Where should I keep my medicine? This drug is given in a hospital or clinic and will not be stored at home. NOTE: This sheet is a summary. It may not cover all possible information. If you have questions about this medicine, talk to your doctor, pharmacist, or health care provider.  2013, [employer] Standard. (08/07/2007 2:32:25 PM) Bortezomib injection What is this medicine? BORTEZOMIB (bor TEZ oh mib) is a chemotherapy drug. It slows the growth of cancer cells. This medicine is used to treat multiple myeloma, lymphoma, and other cancers. This medicine may be used for other purposes; ask your health care provider or pharmacist if you have questions. What should I tell my health care provider before I take this medicine? They need to know if you have any of these conditions: -heart disease -irregular heartbeat -liver disease -low blood counts, like low white blood cells, platelets, or hemoglobin -peripheral neuropathy -taking medicine for blood pressure -an unusual or allergic reaction to bortezomib, mannitol, boron, other medicines, foods, dyes, or preservatives -pregnant or trying to get pregnant -breast-feeding How should I use this medicine? This medicine is for injection into a vein or for injection under the skin. It is given by a health care professional in a hospital or clinic setting. Talk to your pediatrician regarding the use of this medicine in children. Special care may be needed. Overdosage: If you think you have taken too much of this medicine contact a poison control center or emergency room at once. NOTE: This medicine is only for you. Do not share this medicine with others. What if I miss a dose? It is important not to miss your dose. Call your doctor or health care professional if you are unable to keep an appointment. What may interact with this medicine? -medicines for diabetes -medicines to increase blood counts like filgrastim,  pegfilgrastim, sargramostim -zalcitabine Talk to your doctor or health care professional before taking any of these medicines: -acetaminophen -aspirin -ibuprofen -ketoprofen -naproxen This list may not describe all possible interactions. Give your health care provider a list of all the medicines, herbs, non-prescription drugs, or dietary supplements you use. Also tell them if you smoke, drink alcohol, or use illegal drugs. Some items may interact with your medicine. What should I watch for while using this medicine? Visit your doctor for checks on your progress. This drug may make you feel generally unwell. This is not uncommon, as chemotherapy can affect healthy cells as well as cancer cells. Report any side effects. Continue your course of treatment even though you feel ill unless your doctor tells you to stop. You may get drowsy or dizzy. Do not drive, use machinery, or do anything that needs mental alertness until you know how this medicine affects you. Do not stand or sit up quickly, especially if you are an older patient. This reduces the risk of dizzy or fainting spells. In some cases, you may be given additional medicines to help with side effects. Follow all directions for their use. Call your doctor or health care professional for advice if you get a fever, chills or sore throat, or other symptoms of a cold or flu. Do not treat yourself. This drug decreases your body's ability to fight infections. Try to avoid being around people who are sick. This medicine  may increase your risk to bruise or bleed. Call your doctor or health care professional if you notice any unusual bleeding. Be careful brushing and flossing your teeth or using a toothpick because you may get an infection or bleed more easily. If you have any dental work done, tell your dentist you are receiving this medicine. Avoid taking products that contain aspirin, acetaminophen, ibuprofen, naproxen, or ketoprofen unless instructed  by your doctor. These medicines may hide a fever. Do not become pregnant while taking this medicine. Women should inform their doctor if they wish to become pregnant or think they might be pregnant. There is a potential for serious side effects to an unborn child. Talk to your health care professional or pharmacist for more information. Do not breast-feed an infant while taking this medicine. You may have vomiting or diarrhea while taking this medicine. Drink water or other fluids as directed. What side effects may I notice from receiving this medicine? Side effects that you should report to your doctor or health care professional as soon as possible: -allergic reactions like skin rash, itching or hives, swelling of the face, lips, or tongue -breathing problems -changes in hearing -changes in vision -fast, irregular heartbeat -feeling faint or lightheaded, falls -pain, tingling, numbness in the hands or feet -seizures -swelling of the ankles, feet, hands -unusual bleeding or bruising -unusually weak or tired -vomiting Side effects that usually do not require medical attention (report to your doctor or health care professional if they continue or are bothersome): -changes in emotions or moods -constipation -diarrhea -loss of appetite -headache -irritation at site where injected -nausea This list may not describe all possible side effects. Call your doctor for medical advice about side effects. You may report side effects to FDA at 1-800-FDA-1088. Where should I keep my medicine? This drug is given in a hospital or clinic and will not be stored at home. NOTE: This sheet is a summary. It may not cover all possible information. If you have questions about this medicine, talk to your doctor, pharmacist, or health care provider.  2013, [employer]. (06/09/2010 11:42:36 AM)

## 2013-01-22 NOTE — Patient Instructions (Addendum)
[facility] Discharge Instructions for Patients Receiving Chemotherapy  Today you received the following chemotherapy agents: Velcade (skin) and Cytoxan (IV)  To help prevent nausea and vomiting after your treatment, we encourage you to take your nausea medication as directed by your physician.   If you develop nausea and vomiting that is not controlled by your nausea medication, call the clinic.   BELOW ARE SYMPTOMS THAT SHOULD BE REPORTED IMMEDIATELY:  *FEVER GREATER THAN 100.5 F  *CHILLS WITH OR WITHOUT FEVER  NAUSEA AND VOMITING THAT IS NOT CONTROLLED WITH YOUR NAUSEA MEDICATION  *UNUSUAL SHORTNESS OF BREATH  *UNUSUAL BRUISING OR BLEEDING  TENDERNESS IN MOUTH AND THROAT WITH OR WITHOUT PRESENCE OF ULCERS  *URINARY PROBLEMS  *BOWEL PROBLEMS  UNUSUAL RASH Items with * indicate a potential emergency and should be followed up as soon as possible.  Feel free to call the clinic you have any questions or concerns. The clinic phone number is [phone].

## 2013-01-22 NOTE — Telephone Encounter (Signed)
gv and printed appt sched and avs for pt for SEpt and OCT,,,MW added tx.

## 2013-01-22 NOTE — Telephone Encounter (Signed)
Per staff message and POF I have scheduled appts.  [name]  

## 2013-01-23 NOTE — Progress Notes (Signed)
Hematology and Oncology Follow Up Visit  Christina Hopkins [id number] [birth year] 79 y.o. 01/22/2013  6:00 pm Christina Blamer, MD   DIAGNOSIS:  1.  Multiple myeloma. 2.  Anemia secondary to vitamin B12 deficiency.   PAST THERAPY:  Revlimid   (08/2009-08/2012) Zometa    (02/2010 - 02/2012) Velcade    (05/2011 -present) Cytoxan    08/2012 - present) Decadron (08/2012 - present)  CURRENT THERAPY:  1.  Velcade, Cytoxan and Decadron every 2 weeks from 08/29/2012. 2.  Vitamin B12  mg IM monthly. 3.  Aranesp 300 mcg subcu every 2 weeks for hemoglobin less than or equal to 10.   Interim History:  The patient is a pleasant 79 y.o. female who presented for follow up visit.  She was last seen two weeks ago by Christina Hopkins.   Her appetite and weight is stable. Patient has double and blurry vision and will have cataract surgery in September. Christina Hopkins continues to  complain about bloating and nausea. Occasionally she has diarrhea for which he takes Imodium. She reported pain in back,neck and shoulders; numbness in fingers and feet, and dry skin. The patient denied fever, chills, night sweats, change in appetite or weight. She denied odynophagia or dysphagia. No chest pain, palpitations, abdominal pain,  vomiting,constipation, hematochezia. The patient denied dysuria, nocturia, polyuria, hematuria, myalgia, tingling, psychiatric problems.  She is excited about the birth of her grandchild two days ago.   Medications: I have reviewed the patient's current medications.  Current Outpatient Prescriptions  Medication Sig Dispense Refill  . acetaminophen (TYLENOL) 650 MG CR tablet Take 650 mg by mouth every 8 (eight) hours as needed for pain.      Marland Kitchen acyclovir (ZOVIRAX) 400 MG tablet Take 1 tablet (400 mg total) by mouth 2 (two) times daily.  60 tablet  3  . Alum & Mag Hydroxide-Simeth (MAGIC MOUTHWASH) SOLN Swish and spit 5 mLs 4 (four) times daily as needed (for mouth ulcers).       Marland Kitchen aspirin EC 81  MG tablet Take 81 mg by mouth daily.      Marland Kitchen atorvastatin (LIPITOR) 40 MG tablet Take 40 mg by mouth daily.       . carvedilol (COREG) 25 MG tablet Take 25 mg by mouth 2 (two) times daily with a meal.      . Cholecalciferol (VITAMIN D) 2000 UNITS tablet Take 2,000 Units by mouth at bedtime.      . Cholecalciferol (VITAMIN D-3) 5000 UNITS TABS Take 1 tablet by mouth at bedtime.      . cyanocobalamin (,VITAMIN B-12,) 1000 MCG/ML injection Inject 1,000 mcg into the muscle every 30 (thirty) days.      . cyclobenzaprine (FLEXERIL) 10 MG tablet Take 1 tablet (10 mg total) by mouth 3 (three) times daily as needed. For muscle spasm  90 tablet  1  . darbepoetin (ARANESP) 300 MCG/0.6ML SOLN Inject 300 mcg into the skin as needed (given if needed for anemia).      Marland Kitchen HYDROcodone-acetaminophen (NORCO) 10-325 MG per tablet Take 1 tablet by mouth every 6 (six) hours as needed for pain.  30 tablet  3  . lisinopril (PRINIVIL,ZESTRIL) 2.5 MG tablet Take 2.5 mg by mouth every other day.      . metFORMIN (GLUCOPHAGE) 500 MG tablet Take 500 mg by mouth 2 (two) times daily with a meal.        . Multiple Vitamin (MULTIVITAMIN WITH MINERALS) TABS Take 1 tablet by mouth daily.      Marland Kitchen  pantoprazole (PROTONIX) 40 MG tablet Take 40 mg by mouth daily.        Christina Hopkins (SYSTANE OP) Place 2 drops into both eyes at bedtime.       Marland Kitchen PRESCRIPTION MEDICATION Inject into the vein every 7 (seven) days. Velcade and Cytoxan infusion q7d on Wednesdays.      . prochlorperazine (COMPAZINE) 10 MG tablet Take 1 tablet (10 mg total) by mouth every 6 (six) hours as needed. For nausea  30 tablet  3  . Sodium Fluoride (PREVIDENT DT) Place onto teeth at bedtime.      Marland Kitchen spironolactone (ALDACTONE) 25 MG tablet Take 25 mg by mouth every other day.        No current facility-administered medications for this visit.   Facility-Administered Medications Ordered in Other Visits  Medication Dose Route Frequency Provider Last Rate  Last Dose  . darbepoetin (ARANESP) injection 300 mcg  300 mcg Subcutaneous Once Samul Dada, MD         Allergies:  Allergies  Allergen Reactions  . Gemfibrozil Nausea And Vomiting  . Nifedipine Hives  . Questran [Cholestyramine] Other (See Comments)    Do not remember   Problem List: 1. Multiple myeloma, initially presenting as an IgA kappa monoclonal gammopathy in February 2009. 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 This was felt to be an adverse prognostic determinant. Bone marrow on 08/31/2007 showed 46% plasma cells. A repeat bone marrow on 08/05/2009 showed 73% plasma cells. Treatment with Revlimid was started in April 2011. Zometa was started in October 2011 and 2 years of treatment were concluded on 02/23/2012. We had previously started Aranesp for the patient's anemia in May of 2010. Most recent metastatic bone survey was on 08/05/2009. Other x-rays since then are available. Maximum IgA level was 2080 on 08/11/2009. Subcutaneous Velcade was added to the patient's treatment program on 06/14/2011 because of a rising IgA level 1080 on 06/02/2011. The patient had been receiving Velcade every 2 weeks in combination with Revlimid during the early months of 2014. The patient continues to demonstrate progressive disease as evidenced by rising IgA and serum kappa levels. Revlimid was discontinued in April 2014. The patient had received Revlimid for about 3 years from April 2011 through April 2014. As of 08/29/2012, the patient is now receiving Velcade, Cytoxan and Decadron every 2 weeks. The patient has also been receiving Aranesp 300 mcg subcu every 2 weeks for hemoglobin less than or equal to 10.  2. Anemia secondary to vitamin B12 deficiency. The patient had been on oral vitamin B12. However, her vitamin B12 level fell to 284 on 07/26/2011 and vitamin B12 shots 1000 mcg given IM every month was resumed on 09/06/2011.  3. Anemia secondary to iron deficiency.   Past Medical History,  Surgical history, Social history, and Family History were reviewed and updated.  Review of Systems: Constitutional:  Negative for fever, chills, night sweats, anorexia, weight loss, pain. Cardiovascular: no chest pain or dyspnea on exertion Respiratory: no cough, shortness of breath, or wheezing Neurological: no TIA or stroke symptoms Dermatological: negative for rash ENT: Negative for nose bleeds Skin: Negative. Gastrointestinal: no abdominal pain, change in bowel habits, or black or bloody stools Genito-Urinary: no dysuria, trouble voiding, or hematuria Hematological and Lymphatic: negative for - bleeding problems Breast: negative for breast lumps Musculoskeletal: negative for - joint stiffness Remaining ROS negative.  Physical Exam: Blood pressure 89/63, pulse 78, temperature 98.8 F (37.1 C), temperature source Oral, resp. rate 18, height  4\' 9"  (1.448 m), weight 99 lb 8 oz (45.133 kg). ECOG: 1/2 General appearance: alert, cooperative, appears stated age and no distress  Diminished hearing Head: Normocephalic, without obvious abnormality, atraumatic Neck: no adenopathy, supple, symmetrical, trachea midline and thyroid not enlarged, symmetric, no tenderness/mass/nodules Lymph nodes: Cervical adenopathy: None appreciated and Supraclavicular adenopathy: None appreciated Heart:regular rate and rhythm, S1, S2 normal, no murmur, click, rub or gallop Lung:chest clear, no wheezing, rales, normal symmetric air entry, Abdomin: S/NT/ND +BS EXT:No edema   Lab Results: Lab Results  Component Value Date   WBC 3.7* 01/22/2013   HGB 11.0* 01/22/2013   HCT 32.8* 01/22/2013   MCV 99.7 01/22/2013   PLT 136* 01/22/2013     Chemistry      Component Value Date/Time   NA 135* 01/22/2013 0841   NA 124* 09/05/2012 1000   K 4.4 01/22/2013 0841   K 3.9 09/05/2012 1000   CL 101 10/18/2012 1051   CL 89* 09/05/2012 1000   CO2 28 01/22/2013 0841   CO2 25 09/05/2012 1000   BUN 11.7 01/22/2013 0841   BUN 27* 09/05/2012  1000   CREATININE 0.7 01/22/2013 0841   CREATININE 1.15* 09/05/2012 1000   CREATININE 0.69 08/11/2009 1145      Component Value Date/Time   CALCIUM 9.4 01/22/2013 0841   CALCIUM 8.9 09/05/2012 1000   ALKPHOS 49 01/22/2013 0841   ALKPHOS 58 09/05/2012 1000   AST 15 01/22/2013 0841   AST 18 09/05/2012 1000   ALT 14 01/22/2013 0841   ALT 12 09/05/2012 1000   BILITOT 0.49 01/22/2013 0841   BILITOT 0.5 09/05/2012 1000       Radiological Studies: No results found.   Impression and Plan: 1. Multiple myeloma. Her IgA level and serum light chains have improved on current regiment. --We reviewed her labs and we will proceed with chemotherapy with Velcade subcutaneous, Cytoxan 400 mg, IV Decadron 20 mg IV (Day #1, cycle #35).  She has been provided a handout explaining common side-effects.  2. Anemia Vitamin B12 deficiency.  Given vitamin B12 IM last visit.  The patient doesn't need Aranesp today.    3. Follow up. RTC in 2 weeks  for evaluation for another treatment with CBC, CMET, IgA level, and serum light chains.  Spent more than half the time coordinating care.    Javion Holmer, MD 01/22/2013  6:00 pm

## 2013-02-05 ENCOUNTER — Ambulatory Visit (HOSPITAL_BASED_OUTPATIENT_CLINIC_OR_DEPARTMENT_OTHER): Payer: Medicare Other

## 2013-02-05 ENCOUNTER — Other Ambulatory Visit (HOSPITAL_BASED_OUTPATIENT_CLINIC_OR_DEPARTMENT_OTHER): Payer: Medicare Other | Admitting: Lab

## 2013-02-05 VITALS — BP 105/62 | HR 64 | Temp 97.2°F | Resp 18

## 2013-02-05 DIAGNOSIS — Z5112 Encounter for antineoplastic immunotherapy: Secondary | ICD-10-CM

## 2013-02-05 DIAGNOSIS — C9000 Multiple myeloma not having achieved remission: Secondary | ICD-10-CM

## 2013-02-05 DIAGNOSIS — D519 Vitamin B12 deficiency anemia, unspecified: Secondary | ICD-10-CM

## 2013-02-05 DIAGNOSIS — D518 Other vitamin B12 deficiency anemias: Secondary | ICD-10-CM

## 2013-02-05 DIAGNOSIS — Z5111 Encounter for antineoplastic chemotherapy: Secondary | ICD-10-CM

## 2013-02-05 LAB — CBC WITH DIFFERENTIAL/PLATELET
BASO%: 0.6 % (ref 0.0–2.0)
Basophils Absolute: 0 10*3/uL (ref 0.0–0.1)
EOS%: 1.2 % (ref 0.0–7.0)
HCT: 31.7 % — ABNORMAL LOW (ref 34.8–46.6)
HGB: 10.6 g/dL — ABNORMAL LOW (ref 11.6–15.9)
MCH: 34.2 pg — ABNORMAL HIGH (ref 25.1–34.0)
MCHC: 33.4 g/dL (ref 31.5–36.0)
MCV: 102.3 fL — ABNORMAL HIGH (ref 79.5–101.0)
MONO%: 10.8 % (ref 0.0–14.0)
NEUT%: 66.5 % (ref 38.4–76.8)
RDW: 16.2 % — ABNORMAL HIGH (ref 11.2–14.5)

## 2013-02-05 MED ORDER — CYANOCOBALAMIN 1000 MCG/ML IJ SOLN
INTRAMUSCULAR | Status: AC
  Filled 2013-02-05: qty 1

## 2013-02-05 MED ORDER — ONDANSETRON 8 MG/NS 50 ML IVPB
INTRAVENOUS | Status: AC
  Filled 2013-02-05: qty 8

## 2013-02-05 MED ORDER — DEXAMETHASONE SODIUM PHOSPHATE 20 MG/5ML IJ SOLN
20.0000 mg | Freq: Once | INTRAMUSCULAR | Status: AC
  Administered 2013-02-05: 20 mg via INTRAVENOUS

## 2013-02-05 MED ORDER — DEXAMETHASONE SODIUM PHOSPHATE 20 MG/5ML IJ SOLN
INTRAMUSCULAR | Status: AC
  Filled 2013-02-05: qty 5

## 2013-02-05 MED ORDER — SODIUM CHLORIDE 0.9 % IV SOLN
400.0000 mg | Freq: Once | INTRAVENOUS | Status: AC
  Administered 2013-02-05: 400 mg via INTRAVENOUS
  Filled 2013-02-05: qty 20

## 2013-02-05 MED ORDER — SODIUM CHLORIDE 0.9 % IV SOLN
Freq: Once | INTRAVENOUS | Status: AC
  Administered 2013-02-05: 09:00:00 via INTRAVENOUS

## 2013-02-05 MED ORDER — CYANOCOBALAMIN 1000 MCG/ML IJ SOLN
1000.0000 ug | Freq: Once | INTRAMUSCULAR | Status: AC
  Administered 2013-02-05: 1000 ug via INTRAMUSCULAR

## 2013-02-05 MED ORDER — BORTEZOMIB CHEMO SQ INJECTION 3.5 MG (2.5MG/ML)
1.7500 mg | Freq: Once | INTRAMUSCULAR | Status: AC
  Administered 2013-02-05: 1.75 mg via SUBCUTANEOUS
  Filled 2013-02-05: qty 1.75

## 2013-02-05 MED ORDER — ONDANSETRON 8 MG/50ML IVPB (CHCC)
8.0000 mg | Freq: Once | INTRAVENOUS | Status: AC
  Administered 2013-02-05: 8 mg via INTRAVENOUS

## 2013-02-05 NOTE — Patient Instructions (Addendum)
[facility] Discharge Instructions for Patients Receiving Chemotherapy  Today you received the following chemotherapy agents Cytoxan and Velcade.  To help prevent nausea and vomiting after your treatment, we encourage you to take your nausea medication.   If you develop nausea and vomiting that is not controlled by your nausea medication, call the clinic.   BELOW ARE SYMPTOMS THAT SHOULD BE REPORTED IMMEDIATELY:  *FEVER GREATER THAN 100.5 F  *CHILLS WITH OR WITHOUT FEVER  NAUSEA AND VOMITING THAT IS NOT CONTROLLED WITH YOUR NAUSEA MEDICATION  *UNUSUAL SHORTNESS OF BREATH  *UNUSUAL BRUISING OR BLEEDING  TENDERNESS IN MOUTH AND THROAT WITH OR WITHOUT PRESENCE OF ULCERS  *URINARY PROBLEMS  *BOWEL PROBLEMS  UNUSUAL RASH Items with * indicate a potential emergency and should be followed up as soon as possible.  Feel free to call the clinic you have any questions or concerns. The clinic phone number is [phone].    

## 2013-02-15 ENCOUNTER — Other Ambulatory Visit: Payer: Self-pay | Admitting: *Deleted

## 2013-02-15 DIAGNOSIS — C9000 Multiple myeloma not having achieved remission: Secondary | ICD-10-CM

## 2013-02-15 MED ORDER — ACYCLOVIR 400 MG PO TABS: 400.0000 mg | ORAL_TABLET | Freq: Two times a day (BID) | ORAL | Status: DC

## 2013-02-19 ENCOUNTER — Other Ambulatory Visit: Payer: Medicare Other | Admitting: Lab

## 2013-02-19 ENCOUNTER — Other Ambulatory Visit: Payer: Self-pay | Admitting: Internal Medicine

## 2013-02-19 ENCOUNTER — Ambulatory Visit: Payer: Medicare Other

## 2013-02-20 ENCOUNTER — Ambulatory Visit (HOSPITAL_BASED_OUTPATIENT_CLINIC_OR_DEPARTMENT_OTHER): Payer: Medicare Other

## 2013-02-20 ENCOUNTER — Telehealth: Payer: Self-pay | Admitting: *Deleted

## 2013-02-20 ENCOUNTER — Ambulatory Visit (HOSPITAL_BASED_OUTPATIENT_CLINIC_OR_DEPARTMENT_OTHER): Payer: Medicare Other | Admitting: Internal Medicine

## 2013-02-20 ENCOUNTER — Other Ambulatory Visit (HOSPITAL_BASED_OUTPATIENT_CLINIC_OR_DEPARTMENT_OTHER): Payer: Medicare Other | Admitting: Lab

## 2013-02-20 ENCOUNTER — Encounter: Payer: Self-pay | Admitting: Internal Medicine

## 2013-02-20 ENCOUNTER — Telehealth: Payer: Self-pay | Admitting: Internal Medicine

## 2013-02-20 VITALS — BP 88/48 | HR 71 | Temp 97.5°F | Resp 18 | Ht <= 58 in | Wt 100.3 lb

## 2013-02-20 DIAGNOSIS — Z5112 Encounter for antineoplastic immunotherapy: Secondary | ICD-10-CM

## 2013-02-20 DIAGNOSIS — D519 Vitamin B12 deficiency anemia, unspecified: Secondary | ICD-10-CM

## 2013-02-20 DIAGNOSIS — D72819 Decreased white blood cell count, unspecified: Secondary | ICD-10-CM

## 2013-02-20 DIAGNOSIS — E538 Deficiency of other specified B group vitamins: Secondary | ICD-10-CM

## 2013-02-20 DIAGNOSIS — D509 Iron deficiency anemia, unspecified: Secondary | ICD-10-CM

## 2013-02-20 DIAGNOSIS — C9000 Multiple myeloma not having achieved remission: Secondary | ICD-10-CM

## 2013-02-20 DIAGNOSIS — Z5111 Encounter for antineoplastic chemotherapy: Secondary | ICD-10-CM

## 2013-02-20 LAB — CBC WITH DIFFERENTIAL/PLATELET
BASO%: 1.9 % (ref 0.0–2.0)
Basophils Absolute: 0 10*3/uL (ref 0.0–0.1)
HGB: 10.3 g/dL — ABNORMAL LOW (ref 11.6–15.9)
LYMPH%: 22.5 % (ref 14.0–49.7)
MCHC: 34.2 g/dL (ref 31.5–36.0)
MONO#: 0.4 10*3/uL (ref 0.1–0.9)
MONO%: 17.2 % — ABNORMAL HIGH (ref 0.0–14.0)
NEUT#: 1.2 10*3/uL — ABNORMAL LOW (ref 1.5–6.5)
NEUT%: 56.7 % (ref 38.4–76.8)
Platelets: 158 10*3/uL (ref 145–400)
RBC: 2.91 10*6/uL — ABNORMAL LOW (ref 3.70–5.45)
RDW: 16.7 % — ABNORMAL HIGH (ref 11.2–14.5)
WBC: 2.2 10*3/uL — ABNORMAL LOW (ref 3.9–10.3)

## 2013-02-20 LAB — COMPREHENSIVE METABOLIC PANEL (CC13)
ALT: 9 U/L (ref 0–55)
Albumin: 3.3 g/dL — ABNORMAL LOW (ref 3.5–5.0)
Alkaline Phosphatase: 47 U/L (ref 40–150)
Anion Gap: 10 mEq/L (ref 3–11)
CO2: 28 mEq/L (ref 22–29)
Creatinine: 0.8 mg/dL (ref 0.6–1.1)
Potassium: 4 mEq/L (ref 3.5–5.1)
Sodium: 138 mEq/L (ref 136–145)
Total Bilirubin: 0.4 mg/dL (ref 0.20–1.20)
Total Protein: 6.3 g/dL — ABNORMAL LOW (ref 6.4–8.3)

## 2013-02-20 MED ORDER — BORTEZOMIB CHEMO SQ INJECTION 3.5 MG (2.5MG/ML)
1.7500 mg | Freq: Once | INTRAMUSCULAR | Status: AC
  Administered 2013-02-20: 1.75 mg via SUBCUTANEOUS
  Filled 2013-02-20: qty 0.7

## 2013-02-20 MED ORDER — ONDANSETRON 8 MG/NS 50 ML IVPB
INTRAVENOUS | Status: AC
  Filled 2013-02-20: qty 8

## 2013-02-20 MED ORDER — ONDANSETRON 8 MG/50ML IVPB (CHCC)
8.0000 mg | Freq: Once | INTRAVENOUS | Status: AC
  Administered 2013-02-20: 8 mg via INTRAVENOUS

## 2013-02-20 MED ORDER — DEXAMETHASONE SODIUM PHOSPHATE 20 MG/5ML IJ SOLN
20.0000 mg | Freq: Once | INTRAMUSCULAR | Status: AC
  Administered 2013-02-20: 20 mg via INTRAVENOUS

## 2013-02-20 MED ORDER — CYCLOPHOSPHAMIDE CHEMO INJECTION 1 GM
400.0000 mg | Freq: Once | INTRAMUSCULAR | Status: AC
  Administered 2013-02-20: 400 mg via INTRAVENOUS
  Filled 2013-02-20: qty 20

## 2013-02-20 NOTE — Telephone Encounter (Signed)
Per staff message and POF I have scheduled appts.  [name]  

## 2013-02-20 NOTE — Patient Instructions (Signed)
Neutropenia Neutropenia is a condition that occurs when the level of a certain type of white blood cell (neutrophil) in your body becomes lower than normal. Neutrophils are made in the bone marrow and fight infections. These cells protect against bacteria and viruses. The fewer neutrophils you have, and the longer your body remains without them, the greater your risk of getting a severe infection becomes. CAUSES  The cause of neutropenia may be hard to determine. However, it is usually due to 3 main problems:   Decreased production of neutrophils. This may be due to:  Certain medicines such as chemotherapy.  Genetic problems.  Cancer.  Radiation treatments.  Vitamin deficiency.  Some pesticides.  Increased destruction of neutrophils. This may be due to:  Overwhelming infections.  Hemolytic anemia. This is when the body destroys its own blood cells.  Chemotherapy.  Neutrophils moving to areas of the body where they cannot fight infections. This may be due to:  Dialysis procedures.  Conditions where the spleen becomes enlarged. Neutrophils are held in the spleen and are not available to the rest of the body.  Overwhelming infections. The neutrophils are held in the area of the infection and are not available to the rest of the body. SYMPTOMS  There are no specific symptoms of neutropenia. The lack of neutrophils can result in an infection, and an infection can cause various problems. DIAGNOSIS  Diagnosis is made by a blood test. A complete blood count is performed. The normal level of neutrophils in human blood differs with age and race. Infants have lower counts than older children and adults. African Americans have lower counts than Caucasians or Asians. The average adult level is 1500 cells/mm3 of blood. Neutrophil counts are interpreted as follows:  Greater than 1000 cells/mm3 gives normal protection against infection.  500 to 1000 cells/mm3 gives an increased risk for  infection.  200 to 500 cells/mm3 is a greater risk for severe infection.  Lower than 200 cells/mm3 is a marked risk of infection. This may require hospitalization and treatment with antibiotic medicines. TREATMENT  Treatment depends on the underlying cause, severity, and presence of infections or symptoms. It also depends on your health. Your caregiver will discuss the treatment plan with you. Mild cases are often easily treated and have a good outcome. Preventative measures may also be started to limit your risk of infections. Treatment can include:  Taking antibiotics.  Stopping medicines that are known to cause neutropenia.  Correcting nutritional deficiencies by eating green vegetables to supply folic acid and taking vitamin B supplements.  Stopping exposure to pesticides if your neutropenia is related to pesticide exposure.  Taking a blood growth factor called sargramostim, pegfilgrastim, or filgrastim if you are undergoing chemotherapy for cancer. This stimulates white blood cell production.  Removal of the spleen if you have Felty's syndrome and have repeated infections. HOME CARE INSTRUCTIONS   Follow your caregiver's instructions about when you need to have blood work done.  Wash your hands often. Make sure others who come in contact with you also wash their hands.  Wash raw fruits and vegetables before eating them. They can carry bacteria and fungi.  Avoid people with colds or spreadable (contagious) diseases (chickenpox, herpes zoster, influenza).  Avoid large crowds.  Avoid construction areas. The dust can release fungus into the air.  Be cautious around children in daycare or school environments.  Take care of your respiratory system by coughing and deep breathing.  Bathe daily.  Protect your skin from cuts and   burns.  Do not work in the garden or with flowers and plants.  Care for the mouth before and after meals by brushing with a soft toothbrush. If you have  mucositis, do not use mouthwash. Mouthwash contains alcohol and can dry out the mouth even more.  Clean the area between the genitals and the anus (perineal area) after urination and bowel movements. Women need to wipe from front to back.  Use a water soluble lubricant during sexual intercourse and practice good hygiene after. Do not have intercourse if you are severely neutropenic. Check with your caregiver for guidelines.  Exercise daily as tolerated.  Avoid people who were vaccinated with a live vaccine in the past 30 days. You should not receive live vaccines (polio, typhoid).  Do not provide direct care for pets. Avoid animal droppings. Do not clean litter boxes and bird cages.  Do not share food utensils.  Do not use tampons, enemas, or rectal suppositories unless directed by your caregiver.  Use an electric razor to remove hair.  Wash your hands after handling magazines, letters, and newspapers. SEEK IMMEDIATE MEDICAL CARE IF:   You have a fever.  You have chills or start to shake.  You feel nauseous or vomit.  You develop mouth sores.  You develop aches and pains.  You have redness and swelling around open wounds.  Your skin is warm to the touch.  You have pus coming from your wounds.  You develop swollen lymph nodes.  You feel weak or fatigued.  You develop red streaks on the skin. MAKE SURE YOU:  Understand these instructions.  Will watch your condition.  Will get help right away if you are not doing well or get worse. Document Released: 10/22/2001 Document Revised: 07/25/2011 Document Reviewed: 11/19/2010 [facility] Patient Information 2014 [place], [place]. Acyclovir tablets or capsules What is this medicine? ACYCLOVIR (ay SYE kloe veer) is an antiviral medicine. It is used to treat or prevent infections caused by certain kinds of viruses. Examples of these infections include herpes and shingles. This medicine will not cure herpes. This medicine may be  used for other purposes; ask your health care provider or pharmacist if you have questions. What should I tell my health care provider before I take this medicine? They need to know if you have any of these conditions: -kidney disease -an unusual or allergic reaction to acyclovir, ganciclovir, valacyclovir, other medicines, foods, dyes, or preservatives -pregnant or trying to get pregnant -breast-feeding How should I use this medicine? Take this medicine by mouth with a glass of water. Follow the directions on the prescription label. You can take it with or without food. Take your medicine at regular intervals. Do not take your medicine more often than directed. Take all of your medicine as directed even if you think your are better. Do not skip doses or stop your medicine early. Talk to your pediatrician regarding the use of this medicine in children. While this drug may be prescribed for selected conditions, precautions do apply. Overdosage: If you think you have taken too much of this medicine contact a poison control center or emergency room at once. NOTE: This medicine is only for you. Do not share this medicine with others. What if I miss a dose? If you miss a dose, take it as soon as you can. If it is almost time for your next dose, take only that dose. Do not take double or extra doses. What may interact with this medicine? -probenecid This list may  not describe all possible interactions. Give your health care provider a list of all the medicines, herbs, non-prescription drugs, or dietary supplements you use. Also tell them if you smoke, drink alcohol, or use illegal drugs. Some items may interact with your medicine. What should I watch for while using this medicine? Tell your doctor or health care professional if your symptoms do not improve. This medicine works best when started very early in the course of an infection. Begin treatment at the first signs of infection. Drink 6 to 8  glasses of water or fluids every day while you are taking this medicine. This will help prevent side effects. You can still pass chickenpox, shingles, or herpes to another person even while you are taking this medicine. Avoid contact with others as directed. Genital herpes is a sexually transmitted disease. Talk to your doctor about how to stop the spread of infection. What side effects may I notice from receiving this medicine? Side effects that you should report to your doctor or health care professional as soon as possible: -allergic reactions like skin rash, itching or hives, swelling of the face, lips, or tongue -chest pain -confusion, hallucinations, tremor -dark urine -increased sensitivity to the sun -redness, blistering, peeling or loosening of the skin, including inside the mouth -seizures -trouble passing urine or change in the amount of urine -unusual bleeding or bruising, or pinpoint red spots on the skin -unusually weak or tired -yellowing of the eyes or skin Side effects that usually do not require medical attention (report to your doctor or health care professional if they continue or are bothersome): -diarrhea -fever -headache -nausea, vomiting -stomach upset This list may not describe all possible side effects. Call your doctor for medical advice about side effects. You may report side effects to FDA at 1-800-FDA-1088. Where should I keep my medicine? Keep out of the reach of children. Store at room temperature between 15 and 25 degrees C (59 and 77 degrees F). Throw away any unused medicine after the expiration date. NOTE: This sheet is a summary. It may not cover all possible information. If you have questions about this medicine, talk to your doctor, pharmacist, or health care provider.  2012, [employer]. (07/18/2007 1:15:46 PM)Dexamethasone injection What is this medicine? DEXAMETHASONE (dex a METH a sone) is a corticosteroid. It is used to treat  inflammation of the skin, joints, lungs, and other organs. Common conditions treated include asthma, allergies, and arthritis. It is also used for other conditions, like blood disorders and diseases of the adrenal glands. This medicine may be used for other purposes; ask your health care provider or pharmacist if you have questions. What should I tell my health care provider before I take this medicine? They need to know if you have any of these conditions: -blood clotting problems -Cushing's syndrome -diabetes -glaucoma -heart problems or disease -high blood pressure -infection like herpes, measles, tuberculosis, or chickenpox -kidney disease -liver disease -mental problems -myasthenia gravis -osteoporosis -previous heart attack -seizures -stomach, ulcer or intestine disease including colitis and diverticulitis -thyroid problem -an unusual or allergic reaction to dexamethasone, corticosteroids, other medicines, lactose, foods, dyes, or preservatives -pregnant or trying to get pregnant -breast-feeding How should I use this medicine? This medicine is for injection into a muscle, joint, lesion, soft tissue, or vein. It is given by a health care professional in a hospital or clinic setting. Talk to your pediatrician regarding the use of this medicine in children. Special care may be needed. Overdosage: If you think  you have taken too much of this medicine contact a poison control center or emergency room at once. NOTE: This medicine is only for you. Do not share this medicine with others. What if I miss a dose? This may not apply. If you are having a series of injections over a prolonged period, try not to miss an appointment. Call your doctor or health care professional to reschedule if you are unable to keep an appointment. What may interact with this medicine? Do not take this medicine with any of the following medications: -mifepristone, RU-486 -vaccines This medicine may also  interact with the following medications: -amphotericin B -antibiotics like clarithromycin, erythromycin, and troleandomycin -aspirin and aspirin-like drugs -barbiturates like phenobarbital -carbamazepine -cholestyramine -cholinesterase inhibitors like donepezil, galantamine, rivastigmine, and tacrine -cyclosporine -digoxin -diuretics -ephedrine -female hormones, like estrogens or progestins and birth control pills -indinavir -isoniazid -ketoconazole -medicines for diabetes -medicines that improve muscle tone or strength for conditions like myasthenia gravis -NSAIDs, medicines for pain and inflammation, like ibuprofen or naproxen -phenytoin -rifampin -thalidomide -warfarin This list may not describe all possible interactions. Give your health care provider a list of all the medicines, herbs, non-prescription drugs, or dietary supplements you use. Also tell them if you smoke, drink alcohol, or use illegal drugs. Some items may interact with your medicine. What should I watch for while using this medicine? Your condition will be monitored carefully while you are receiving this medicine. If you are taking this medicine for a long time, carry an identification card with your name and address, the type and dose of your medicine, and your doctor's name and address. This medicine may increase your risk of getting an infection. Stay away from people who are sick. Tell your doctor or health care professional if you are around anyone with measles or chickenpox. Talk to your health care provider before you get any vaccines that you take this medicine. If you are going to have surgery, tell your doctor or health care professional that you have taken this medicine within the last twelve months. Ask your doctor or health care professional about your diet. You may need to lower the amount of salt you eat. The medicine can increase your blood sugar. If you are a diabetic check with your doctor if you  need help adjusting the dose of your diabetic medicine. What side effects may I notice from receiving this medicine? Side effects that you should report to your doctor or health care professional as soon as possible: -allergic reactions like skin rash, itching or hives, swelling of the face, lips, or tongue -black or tarry stools -change in the amount of urine -changes in vision -confusion, excitement, restlessness, a false sense of well-being -fever, sore throat, sneezing, cough, or other signs of infection, wounds that will not heal -hallucinations -increased thirst -mental depression, mood swings, mistaken feelings of self importance or of being mistreated -pain in hips, back, ribs, arms, shoulders, or legs -pain, redness, or irritation at the injection site -redness, blistering, peeling or loosening of the skin, including inside the mouth -rounding out of face -swelling of feet or lower legs -unusual bleeding or bruising -unusual tired or weak -wounds that do not heal Side effects that usually do not require medical attention (report to your doctor or health care professional if they continue or are bothersome): -diarrhea or constipation -change in taste -headache -nausea, vomiting -skin problems, acne, thin and shiny skin -touble sleeping -unusual growth of hair on the face or body -weight gain This list  may not describe all possible side effects. Call your doctor for medical advice about side effects. You may report side effects to FDA at 1-800-FDA-1088. Where should I keep my medicine? This drug is given in a hospital or clinic and will not be stored at home. NOTE: This sheet is a summary. It may not cover all possible information. If you have questions about this medicine, talk to your doctor, pharmacist, or health care provider.  2012, [employer] Standard. (08/23/2007 2:04:12 PM)Bortezomib injection What is this medicine? BORTEZOMIB (bor TEZ oh mib) is a chemotherapy  drug. It slows the growth of cancer cells. This medicine is used to treat multiple myeloma, lymphoma, and other cancers. This medicine may be used for other purposes; ask your health care provider or pharmacist if you have questions. What should I tell my health care provider before I take this medicine? They need to know if you have any of these conditions: -heart disease -irregular heartbeat -liver disease -low blood counts, like low white blood cells, platelets, or hemoglobin -peripheral neuropathy -taking medicine for blood pressure -an unusual or allergic reaction to bortezomib, mannitol, boron, other medicines, foods, dyes, or preservatives -pregnant or trying to get pregnant -breast-feeding How should I use this medicine? This medicine is for injection into a vein or for injection under the skin. It is given by a health care professional in a hospital or clinic setting. Talk to your pediatrician regarding the use of this medicine in children. Special care may be needed. Overdosage: If you think you have taken too much of this medicine contact a poison control center or emergency room at once. NOTE: This medicine is only for you. Do not share this medicine with others. What if I miss a dose? It is important not to miss your dose. Call your doctor or health care professional if you are unable to keep an appointment. What may interact with this medicine? -medicines for diabetes -medicines to increase blood counts like filgrastim, pegfilgrastim, sargramostim -zalcitabine Talk to your doctor or health care professional before taking any of these medicines: -acetaminophen -aspirin -ibuprofen -ketoprofen -naproxen This list may not describe all possible interactions. Give your health care provider a list of all the medicines, herbs, non-prescription drugs, or dietary supplements you use. Also tell them if you smoke, drink alcohol, or use illegal drugs. Some items may interact with your  medicine. What should I watch for while using this medicine? Visit your doctor for checks on your progress. This drug may make you feel generally unwell. This is not uncommon, as chemotherapy can affect healthy cells as well as cancer cells. Report any side effects. Continue your course of treatment even though you feel ill unless your doctor tells you to stop. You may get drowsy or dizzy. Do not drive, use machinery, or do anything that needs mental alertness until you know how this medicine affects you. Do not stand or sit up quickly, especially if you are an older patient. This reduces the risk of dizzy or fainting spells. In some cases, you may be given additional medicines to help with side effects. Follow all directions for their use. Call your doctor or health care professional for advice if you get a fever, chills or sore throat, or other symptoms of a cold or flu. Do not treat yourself. This drug decreases your body's ability to fight infections. Try to avoid being around people who are sick. This medicine may increase your risk to bruise or bleed. Call your doctor or health  care professional if you notice any unusual bleeding. Be careful brushing and flossing your teeth or using a toothpick because you may get an infection or bleed more easily. If you have any dental work done, tell your dentist you are receiving this medicine. Avoid taking products that contain aspirin, acetaminophen, ibuprofen, naproxen, or ketoprofen unless instructed by your doctor. These medicines may hide a fever. Do not become pregnant while taking this medicine. Women should inform their doctor if they wish to become pregnant or think they might be pregnant. There is a potential for serious side effects to an unborn child. Talk to your health care professional or pharmacist for more information. Do not breast-feed an infant while taking this medicine. You may have vomiting or diarrhea while taking this medicine. Drink  water or other fluids as directed. What side effects may I notice from receiving this medicine? Side effects that you should report to your doctor or health care professional as soon as possible: -allergic reactions like skin rash, itching or hives, swelling of the face, lips, or tongue -breathing problems -changes in hearing -changes in vision -fast, irregular heartbeat -feeling faint or lightheaded, falls -pain, tingling, numbness in the hands or feet -seizures -swelling of the ankles, feet, hands -unusual bleeding or bruising -unusually weak or tired -vomiting Side effects that usually do not require medical attention (report to your doctor or health care professional if they continue or are bothersome): -changes in emotions or moods -constipation -diarrhea -loss of appetite -headache -irritation at site where injected -nausea This list may not describe all possible side effects. Call your doctor for medical advice about side effects. You may report side effects to FDA at 1-800-FDA-1088. Where should I keep my medicine? This drug is given in a hospital or clinic and will not be stored at home. NOTE: This sheet is a summary. It may not cover all possible information. If you have questions about this medicine, talk to your doctor, pharmacist, or health care provider.  2013, [employer] Standard. (06/09/2010 11:42:36 AM) Cyclophosphamide injection What is this medicine? CYCLOPHOSPHAMIDE (sye kloe FOSS fa mide) is a chemotherapy drug. It slows the growth of cancer cells. This medicine is used to treat many types of cancer like lymphoma, myeloma, leukemia, breast cancer, and ovarian cancer, to name a few. It is also used to treat nephrotic syndrome in children. This medicine may be used for other purposes; ask your health care provider or pharmacist if you have questions. What should I tell my health care provider before I take this medicine? They need to know if you have any of  these conditions: -blood disorders -history of other chemotherapy -history of radiation therapy -infection -kidney disease -liver disease -tumors in the bone marrow -an unusual or allergic reaction to cyclophosphamide, other chemotherapy, other medicines, foods, dyes, or preservatives -pregnant or trying to get pregnant -breast-feeding How should I use this medicine? This drug is usually given as an injection into a vein or muscle or by infusion into a vein. It is administered in a hospital or clinic by a specially trained health care professional. Talk to your pediatrician regarding the use of this medicine in children. While this drug may be prescribed for selected conditions, precautions do apply. Overdosage: If you think you have taken too much of this medicine contact a poison control center or emergency room at once. NOTE: This medicine is only for you. Do not share this medicine with others. What if I miss a dose? It is important not  to miss your dose. Call your doctor or health care professional if you are unable to keep an appointment. What may interact with this medicine? Do not take this medicine with any of the following medications: -mibefradil -nalidixic acid This medicine may also interact with the following medications: -doxorubicin -etanercept -medicines to increase blood counts like filgrastim, pegfilgrastim, sargramostim -medicines that block muscle or nerve pain -St. John's Wort -phenobarbital -succinylcholine chloride -trastuzumab -vaccines Talk to your doctor or health care professional before taking any of these medicines: -acetaminophen -aspirin -ibuprofen -ketoprofen -naproxen This list may not describe all possible interactions. Give your health care provider a list of all the medicines, herbs, non-prescription drugs, or dietary supplements you use. Also tell them if you smoke, drink alcohol, or use illegal drugs. Some items may interact with your  medicine. What should I watch for while using this medicine? Visit your doctor for checks on your progress. This drug may make you feel generally unwell. This is not uncommon, as chemotherapy can affect healthy cells as well as cancer cells. Report any side effects. Continue your course of treatment even though you feel ill unless your doctor tells you to stop. Drink water or other fluids as directed. Urinate often, even at night. In some cases, you may be given additional medicines to help with side effects. Follow all directions for their use. Call your doctor or health care professional for advice if you get a fever, chills or sore throat, or other symptoms of a cold or flu. Do not treat yourself. This drug decreases your body's ability to fight infections. Try to avoid being around people who are sick. This medicine may increase your risk to bruise or bleed. Call your doctor or health care professional if you notice any unusual bleeding. Be careful brushing and flossing your teeth or using a toothpick because you may get an infection or bleed more easily. If you have any dental work done, tell your dentist you are receiving this medicine. Avoid taking products that contain aspirin, acetaminophen, ibuprofen, naproxen, or ketoprofen unless instructed by your doctor. These medicines may hide a fever. Do not become pregnant while taking this medicine. Women should inform their doctor if they wish to become pregnant or think they might be pregnant. There is a potential for serious side effects to an unborn child. Talk to your health care professional or pharmacist for more information. Do not breast-feed an infant while taking this medicine. Men should inform their doctor if they wish to father a child. This medicine may lower sperm counts. If you are going to have surgery, tell your doctor or health care professional that you have taken this medicine. What side effects may I notice from receiving this  medicine? Side effects that you should report to your doctor or health care professional as soon as possible: -allergic reactions like skin rash, itching or hives, swelling of the face, lips, or tongue -low blood counts - this medicine may decrease the number of white blood cells, red blood cells and platelets. You may be at increased risk for infections and bleeding. -signs of infection - fever or chills, cough, sore throat, pain or difficulty passing urine -signs of decreased platelets or bleeding - bruising, pinpoint red spots on the skin, black, tarry stools, blood in the urine -signs of decreased red blood cells - unusually weak or tired, fainting spells, lightheadedness -breathing problems -dark urine -mouth sores -pain, swelling, redness at site where injected -swelling of the ankles, feet, hands -trouble passing  urine or change in the amount of urine -weight gain -yellowing of the eyes or skin Side effects that usually do not require medical attention (report to your doctor or health care professional if they continue or are bothersome): -changes in nail or skin color -diarrhea -hair loss -loss of appetite -missed menstrual periods -nausea, vomiting -stomach pain This list may not describe all possible side effects. Call your doctor for medical advice about side effects. You may report side effects to FDA at 1-800-FDA-1088. Where should I keep my medicine? This drug is given in a hospital or clinic and will not be stored at home. NOTE: This sheet is a summary. It may not cover all possible information. If you have questions about this medicine, talk to your doctor, pharmacist, or health care provider.  2013, [employer]/Gold Standard. (08/07/2007 2:32:25 PM) Multiple Myeloma Multiple myeloma is the most common cancer of bone. It is caused by the uncontrolled multiplication of a type of white blood cell in the marrow. This white blood cell is called a plasma cell. This means the bone  marrow is overworking producing plasma cells. Soon these overproduced cells begin to take up room in the marrow that is needed by other cells. This means that there are soon not enough red or white blood cells or platelets. Not enough red cells mean that the person is anemic. There are not enough red blood cells to carry oxygen around the body. There are not enough white blood cells to fight disease. This causes the person with multiple myeloma to not feel well. There is also bone pain through much of the body. SYMPTOMS  Anemia causes fatigue (tiredness) and weakness.  Back pain is common. This is from fractures (break in bones) caused by damage to the bones of the back.  Lack of white blood cells makes infection more likely.  Bleeding is a common problem from lack of the cells (platelets). Platelets help blood clots form. This may show up as bleeding from any place. Commonly this shows up as bleeding from the nose or gums.  Fractures (bone breaks) are more common anywhere. The back and ribs are the most commonly fractured areas. DIAGNOSIS  This tumor is often suggested by blood tests. Often doing a bone marrow sample makes the diagnosis (learning what is wrong). This is a test performed by taking a small sample of bone with a small needle. This bone often comes from the sternum (breast bone). This sample is sent to a pathologist (a specialist in looking at tissue under a microscope). After looking at the sample under the microscope, the pathologist is able to make a diagnosis of the problem. X-rays may also show boney changes. TREATMENT   Occasionally, anti-cancer medications may be used with multiple myeloma. Your caregiver can discuss this with you.  Medications can also be given to help with the bone pain.  There is no cure for multiple myeloma. Lifestyle changes can add years of quality living. HOME CARE INSTRUCTIONS  Often there is no specific treatment for multiple myeloma. Most of the  treatment consists of adjustments in dietary and living activities. Some of these changes include:  Your dietitian or caregiver helping you with your dietary questions.  Taking iron and vitamins as prescribed by your caregiver.  Eating a well balanced diet.  Staying active, but follow restrictions suggested by your caregiver. Avoiding heavy lifting (more than 10 pounds) and activities that cause increased pain.  Drinking plenty of water.  Using back braces and a  cane may help with some of the boney pain. SEEK IMMEDIATE MEDICAL CARE IF:  You develop severe, uncontrolled boney pain.  You or your family notices confusion, problems with decision-making or inability to stay awake.  You notice increased urination or constipation.  You notice problems holding your water or stool.  You have numbness or loss of control of your extremities (arms/hands or legs/feet). Document Released: 01/25/2001 Document Revised: 07/25/2011 Document Reviewed: 04/27/2008 [facility] Patient Information 2014 [place], [place].

## 2013-02-20 NOTE — Patient Instructions (Signed)
[facility] Discharge Instructions for Patients Receiving Chemotherapy  Today you received the following chemotherapy agents:  Velcade/Cytoxan To help prevent nausea and vomiting after your treatment, we encourage you to take your nausea medication   If you develop nausea and vomiting that is not controlled by your nausea medication, call the clinic.   BELOW ARE SYMPTOMS THAT SHOULD BE REPORTED IMMEDIATELY:  *FEVER GREATER THAN 100.5 F  *CHILLS WITH OR WITHOUT FEVER  NAUSEA AND VOMITING THAT IS NOT CONTROLLED WITH YOUR NAUSEA MEDICATION  *UNUSUAL SHORTNESS OF BREATH  *UNUSUAL BRUISING OR BLEEDING  TENDERNESS IN MOUTH AND THROAT WITH OR WITHOUT PRESENCE OF ULCERS  *URINARY PROBLEMS  *BOWEL PROBLEMS  UNUSUAL RASH Items with * indicate a potential emergency and should be followed up as soon as possible.  Feel free to call the clinic you have any questions or concerns. The clinic phone number is [phone].

## 2013-02-20 NOTE — Telephone Encounter (Signed)
appts made pt aware mw scheduled tx cal and avs to pt shh

## 2013-02-20 NOTE — Progress Notes (Signed)
[facility] OFFICE PROGRESS NOTE  Christina Blamer, MD [address], [address] [place] [address]  DIAGNOSIS: Anemia, iron deficiency  Anemia, vitamin B12 deficiency  Chief Complaint  Patient presents with  . Multiple Myeloma   DIAGNOSIS:  1. Multiple myeloma.  2. Anemia secondary to vitamin B12 deficiency.   PAST THERAPY:  Revlimid (08/2009-08/2012)  Zometa (02/2010 - 02/2012)  Velcade (05/2011 -present)  Cytoxan 08/2012 - present)  Decadron (08/2012 - present)   CURRENT THERAPY:  1. Velcade, Cytoxan and Decadron every 2 weeks from 08/29/2012.  2. Vitamin B12 mg IM monthly.  3. Aranesp 300 mcg subcu every 2 weeks for hemoglobin less than or equal to 10.   Interim History: The patient is a pleasant 79 y.o. female who presented for follow up visit. She was last seen by me on 01/22/2013. Her appetite and weight is stable. Patient had double and blurry vision and had successful cataract surgery this past September. She still  has diarrhea for which he takes Imodium. She reported pain in back,neck and shoulders; numbness in fingers and feet, and dry skin. He reports receiving the flu shot. She denies any sent hospitalizations or emergency room visits.  The patient denied fever, chills, night sweats, change in appetite or weight. She denied odynophagia or dysphagia. No chest pain, palpitations, abdominal pain, vomiting,constipation, hematochezia. The patient denied dysuria, nocturia, polyuria, hematuria, myalgia, tingling, psychiatric problems.   MEDICAL HISTORY: Past Medical History  Diagnosis Date  . Hypertension   . Diabetes mellitus   . Anemia   . Osteoporosis   . Dyslipidemia   . Myocardial infarction   . colon ca dx'd 2003    surg only  . Multiple myeloma dx'd 2009    oral chemo ongoing  . Coronary artery disease   . Shortness of breath   . GERD (gastroesophageal reflux disease)   . Wears glasses     INTERIM HISTORY: has Multiple myeloma;  Anemia, vitamin B12 deficiency; Anemia, iron deficiency; Vitamin d deficiency; Magnesium deficiency; Diabetes mellitus; Hypertension; Dyslipidemia; Coronary artery disease; Vitamin B12 deficiency; Hypotension; Encephalopathy; TIA (transient ischemic attack); and Vocal cord paralysis, unilateral complete on her problem list.    ALLERGIES:  is allergic to gemfibrozil; nifedipine; and questran.  MEDICATIONS: has a current medication list which includes the following prescription(s): acetaminophen, acyclovir, magic mouthwash, aspirin ec, carvedilol, vitamin d, vitamin d-3, cyanocobalamin, cyclobenzaprine, darbepoetin, hydrocodone-acetaminophen, lisinopril, metformin, multivitamin with minerals, pantoprazole, polyethyl glycol-propyl glycol, PRESCRIPTION MEDICATION, prochlorperazine, sodium fluoride, and spironolactone, and the following Facility-Administered Medications: darbepoetin.  SURGICAL HISTORY:  Past Surgical History  Procedure Laterality Date  . Coronary artery bypass graft  08/2001    4 vessel  . Colon surgery  2003  . Tonsillectomy    . Appendectomy    . Abdominal hysterectomy    . Back surgery    . Eye surgery      cataracts  . Direct laryngoscopy with radiaesse injection Left 08/13/2012    Procedure: LEFT VOCAL CORD RADIESSE AUGMENTATION LARYNGOSCOPY ;  Surgeon: Flo Shanks, MD;  Location: [facility];  Service: ENT;  Laterality: Left;   Problem List:  1. Multiple myeloma, initially presenting as an IgA kappa monoclonal gammopathy in February 2009. 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 This was felt to be an adverse prognostic determinant. Bone marrow on 08/31/2007 showed 46% plasma cells. A repeat bone marrow on 08/05/2009 showed 73% plasma cells. Treatment with Revlimid was started in April 2011. Zometa was started in October  2011 and 2 years of treatment were concluded on 02/23/2012. We had previously started Aranesp for the patient's anemia in May of 2010.  Most recent metastatic bone survey was on 08/05/2009. Other x-rays since then are available. Maximum IgA level was 2080 on 08/11/2009. Subcutaneous Velcade was added to the patient's treatment program on 06/14/2011 because of a rising IgA level 1080 on 06/02/2011. The patient had been receiving Velcade every 2 weeks in combination with Revlimid during the early months of 2014. The patient continues to demonstrate progressive disease as evidenced by rising IgA and serum kappa levels. Revlimid was discontinued in April 2014. The patient had received Revlimid for about 3 years from April 2011 through April 2014. As of 08/29/2012, the patient is now receiving Velcade, Cytoxan and Decadron every 2 weeks. The patient has also been receiving Aranesp 300 mcg subcu every 2 weeks for hemoglobin less than or equal to 10.  2. Anemia secondary to vitamin B12 deficiency. The patient had been on oral vitamin B12. However, her vitamin B12 level fell to 284 on 07/26/2011 and vitamin B12 shots 1000 mcg given IM every month was resumed on 09/06/2011.  3. Anemia secondary to iron deficiency.   REVIEW OF SYSTEMS:   Constitutional: Denies fevers, chills or abnormal weight loss Eyes: Denies blurriness of vision Ears, nose, mouth, throat, and face: Denies mucositis or sore throat Respiratory: Denies cough, dyspnea or wheezes Cardiovascular: Denies palpitation, chest discomfort or lower extremity swelling Gastrointestinal:  Denies nausea, heartburn;  positive for constipation Skin: Denies abnormal skin rashes Lymphatics: Denies new lymphadenopathy or easy bruising Neurological:Denies numbness, tingling or new weaknesses Behavioral/Psych: Mood is stable, no new changes  All other systems were reviewed with the patient and are negative.  PHYSICAL EXAMINATION: ECOG PERFORMANCE STATUS: 1 - Symptomatic but completely ambulatory  Blood pressure 88/48, pulse 71, temperature 97.5 F (36.4 C), temperature source Oral, resp. rate  18, height 4\' 9"  (1.448 m), weight 100 lb 4.8 oz (45.496 kg).  GENERAL:alert, no distress and comfortable; Diminished hearing  SKIN: skin color, texture, turgor are normal, no rashes or significant lesions EYES: normal, Conjunctiva are pink and non-injected, sclera clear OROPHARYNX:no exudate, no erythema and lips, buccal mucosa, and tongue normal  NECK: supple, thyroid normal size, non-tender, without nodularity LYMPH:  no palpable lymphadenopathy in the cervical, axillary or supraclavicular LUNGS: clear to auscultation and percussion with normal breathing effort HEART: regular rate & rhythm and no murmurs and no lower extremity edema ABDOMEN:abdomen soft, non-tender and normal bowel sounds Musculoskeletal:no cyanosis of digits and no clubbing  NEURO: alert & oriented x 3 with fluent speech, no focal motor/sensory deficits   LABORATORY DATA: Results for orders placed in visit on 02/20/13 (from the past 48 hour(s))  CBC WITH DIFFERENTIAL     Status: Abnormal   Collection Time    02/20/13  8:38 AM      Result Value Range   WBC 2.2 (*) 3.9 - 10.3 10e3/uL   NEUT# 1.2 (*) 1.5 - 6.5 10e3/uL   HGB 10.3 (*) 11.6 - 15.9 g/dL   HCT 16.1 (*) 09.6 - 04.5 %   Platelets 158  145 - 400 10e3/uL   MCV 103.2 (*) 79.5 - 101.0 fL   MCH 35.3 (*) 25.1 - 34.0 pg   MCHC 34.2  31.5 - 36.0 g/dL   RBC 4.09 (*) 8.11 - 9.14 10e6/uL   RDW 16.7 (*) 11.2 - 14.5 %   lymph# 0.5 (*) 0.9 - 3.3 10e3/uL   MONO# 0.4  0.1 - 0.9 10e3/uL   Eosinophils Absolute 0.0  0.0 - 0.5 10e3/uL   Basophils Absolute 0.0  0.0 - 0.1 10e3/uL   NEUT% 56.7  38.4 - 76.8 %   LYMPH% 22.5  14.0 - 49.7 %   MONO% 17.2 (*) 0.0 - 14.0 %   EOS% 1.7  0.0 - 7.0 %   BASO% 1.9  0.0 - 2.0 %    Results for Christina Hopkins, Christina Hopkins (MRN [id number]) as of 02/21/2013 10:27  Ref. Range 02/20/2013 08:38  IgA Latest Range: 69-380 mg/dL 657 (H)    Results for Christina Hopkins, Christina Hopkins (MRN [id number]) as of 02/21/2013 10:27  Ref. Range 01/08/2013 08:18  IgA Latest Range:  69-380 mg/dL 841 (H)   Labs:  Lab Results  Component Value Date   WBC 2.2* 02/20/2013   HGB 10.3* 02/20/2013   HCT 30.1* 02/20/2013   MCV 103.2* 02/20/2013   PLT 158 02/20/2013   NEUTROABS 1.2* 02/20/2013      Chemistry      Component Value Date/Time   NA 135* 01/22/2013 0841   NA 124* 09/05/2012 1000   K 4.4 01/22/2013 0841   K 3.9 09/05/2012 1000   CL 101 10/18/2012 1051   CL 89* 09/05/2012 1000   CO2 28 01/22/2013 0841   CO2 25 09/05/2012 1000   BUN 11.7 01/22/2013 0841   BUN 27* 09/05/2012 1000   CREATININE 0.7 01/22/2013 0841   CREATININE 1.15* 09/05/2012 1000   CREATININE 0.69 08/11/2009 1145      Component Value Date/Time   CALCIUM 9.4 01/22/2013 0841   CALCIUM 8.9 09/05/2012 1000   ALKPHOS 49 01/22/2013 0841   ALKPHOS 58 09/05/2012 1000   AST 15 01/22/2013 0841   AST 18 09/05/2012 1000   ALT 14 01/22/2013 0841   ALT 12 09/05/2012 1000   BILITOT 0.49 01/22/2013 0841   BILITOT 0.5 09/05/2012 1000     CBC:  Recent Labs Lab 02/20/13 0838  WBC 2.2*  NEUTROABS 1.2*  HGB 10.3*  HCT 30.1*  MCV 103.2*  PLT 158   RADIOGRAPHIC STUDIES: METASTATIC BONE SURVEY 11/01/2012 Comparison: 08/05/2009  Correlation: Chest and abdominal radiographs 09/05/2012, CT chest  07/25/2012  Findings:  Bones appear demineralized.  Extensive scattered atherosclerotic calcifications.  Post CABG.  Severe multilevel degenerative disc and facet disease changes of the cervicothoracic and lumbar spine.  Prior vertebroplasties at T11, T12, L1.  Endplate compression deformities at T6, T7, and T10 unchanged.  Inferior endplate compression deformity L3 new since radiographs 2011.  No definite new thoracic compression fracture.  Bowel gas projects over the right inferior pubic ramus and  obturator foramen question obturator hernia. Bilateral  acromioclavicular and glenohumeral joint degenerative changes with bilateral chronic rotator cuff tears.  Degenerative changes bilateral knees.  Hiatal hernia.  Callus identified  at a probable healing fracture of a lateral lower left rib, new.  IMPRESSION:  Osseous demineralization with multiple thoracolumbar compression fractures and prior vertebroplasties.  Inferior endplate compression deformity L3 new since 2011 though of uncertain age. No definite destructive lytic lesion identified to suggest new multiple myelomatous site.  Scattered degenerative changes as above.  Small hiatal hernia.  Extensive atherosclerotic disease.  Question right obturator hernia.    ASSESSMENT: Christina Hopkins 79 y.o. female with a history of Anemia, iron deficiency  Anemia, vitamin B12 deficiency   PLAN:  1. Multiple myeloma. Her IgA level and serum light chains have improved on current regiment. Her IgA level is 428 down from 505 in 01/08/2013. --  We reviewed her labs and we will proceed with chemotherapy with Velcade subcutaneous, Cytoxan 400 mg, IV Decadron 20 mg IV (Day #1, cycle #37). She has been provided a handout explaining common side-effects.   2. Leukopenia likely secondary to chemotherapy. ANC of 1.2. We will give her chemotherapy today but we will recheck CBC in one week. Patient was canceled on neutropenic precautions extensively and provided a handout.  He will report a fever greater than 100.5 to the M.D. on-call and/or report to the emergency room.   3. Anemia  Vitamin B12 deficiency. Given vitamin B12 IM last visit.  The patient doesn't need Aranesp today a summer hemoglobin 10.3.   4. Follow up. RTC in 4 weeks for evaluation for another treatment with CBC, CMET, IgA level, and serum light chains.   All questions were answered. The patient knows to call the clinic with any problems, questions or concerns. We can certainly see the patient much sooner if necessary.  I spent 10 minutes counseling the patient face to face. The total time spent in the appointment was 15 minutes.    Roman Sandall, MD 02/20/2013 9:29 AM

## 2013-02-21 LAB — KAPPA/LAMBDA LIGHT CHAINS: Lambda Free Lght Chn: 0.81 mg/dL (ref 0.57–2.63)

## 2013-02-27 ENCOUNTER — Other Ambulatory Visit (HOSPITAL_BASED_OUTPATIENT_CLINIC_OR_DEPARTMENT_OTHER): Payer: Medicare Other

## 2013-02-27 ENCOUNTER — Encounter (INDEPENDENT_AMBULATORY_CARE_PROVIDER_SITE_OTHER): Payer: Self-pay

## 2013-02-27 DIAGNOSIS — D509 Iron deficiency anemia, unspecified: Secondary | ICD-10-CM

## 2013-02-27 DIAGNOSIS — C9000 Multiple myeloma not having achieved remission: Secondary | ICD-10-CM

## 2013-02-27 DIAGNOSIS — D518 Other vitamin B12 deficiency anemias: Secondary | ICD-10-CM

## 2013-02-27 DIAGNOSIS — D519 Vitamin B12 deficiency anemia, unspecified: Secondary | ICD-10-CM

## 2013-02-27 LAB — CBC WITH DIFFERENTIAL/PLATELET
BASO%: 0.4 % (ref 0.0–2.0)
Basophils Absolute: 0 10*3/uL (ref 0.0–0.1)
EOS%: 1.2 % (ref 0.0–7.0)
HCT: 30 % — ABNORMAL LOW (ref 34.8–46.6)
LYMPH%: 12.6 % — ABNORMAL LOW (ref 14.0–49.7)
MCH: 33.9 pg (ref 25.1–34.0)
MCHC: 33.7 g/dL (ref 31.5–36.0)
NEUT%: 76.1 % (ref 38.4–76.8)
Platelets: 111 10*3/uL — ABNORMAL LOW (ref 145–400)
RBC: 2.98 10*6/uL — ABNORMAL LOW (ref 3.70–5.45)
WBC: 4.9 10*3/uL (ref 3.9–10.3)
nRBC: 0 % (ref 0–0)

## 2013-03-05 ENCOUNTER — Ambulatory Visit: Payer: Medicare Other

## 2013-03-05 ENCOUNTER — Other Ambulatory Visit: Payer: Medicare Other | Admitting: Lab

## 2013-03-06 ENCOUNTER — Other Ambulatory Visit (HOSPITAL_BASED_OUTPATIENT_CLINIC_OR_DEPARTMENT_OTHER): Payer: Medicare Other | Admitting: Lab

## 2013-03-06 ENCOUNTER — Encounter (INDEPENDENT_AMBULATORY_CARE_PROVIDER_SITE_OTHER): Payer: Self-pay

## 2013-03-06 ENCOUNTER — Ambulatory Visit (HOSPITAL_BASED_OUTPATIENT_CLINIC_OR_DEPARTMENT_OTHER): Payer: Medicare Other

## 2013-03-06 VITALS — BP 140/61 | HR 77 | Temp 97.5°F | Resp 17

## 2013-03-06 DIAGNOSIS — Z5112 Encounter for antineoplastic immunotherapy: Secondary | ICD-10-CM

## 2013-03-06 DIAGNOSIS — D509 Iron deficiency anemia, unspecified: Secondary | ICD-10-CM

## 2013-03-06 DIAGNOSIS — D519 Vitamin B12 deficiency anemia, unspecified: Secondary | ICD-10-CM

## 2013-03-06 DIAGNOSIS — C9000 Multiple myeloma not having achieved remission: Secondary | ICD-10-CM

## 2013-03-06 DIAGNOSIS — Z5111 Encounter for antineoplastic chemotherapy: Secondary | ICD-10-CM

## 2013-03-06 DIAGNOSIS — E538 Deficiency of other specified B group vitamins: Secondary | ICD-10-CM

## 2013-03-06 LAB — CBC WITH DIFFERENTIAL/PLATELET
BASO%: 0.9 % (ref 0.0–2.0)
Basophils Absolute: 0 10*3/uL (ref 0.0–0.1)
EOS%: 0.9 % (ref 0.0–7.0)
HGB: 10.8 g/dL — ABNORMAL LOW (ref 11.6–15.9)
MCH: 34.4 pg — ABNORMAL HIGH (ref 25.1–34.0)
MCHC: 33.8 g/dL (ref 31.5–36.0)
MCV: 101.9 fL — ABNORMAL HIGH (ref 79.5–101.0)
MONO%: 13.7 % (ref 0.0–14.0)
Platelets: 171 10*3/uL (ref 145–400)
RBC: 3.14 10*6/uL — ABNORMAL LOW (ref 3.70–5.45)
RDW: 15.4 % — ABNORMAL HIGH (ref 11.2–14.5)
lymph#: 0.7 10*3/uL — ABNORMAL LOW (ref 0.9–3.3)

## 2013-03-06 MED ORDER — ONDANSETRON 8 MG/50ML IVPB (CHCC)
8.0000 mg | Freq: Once | INTRAVENOUS | Status: AC
  Administered 2013-03-06: 8 mg via INTRAVENOUS

## 2013-03-06 MED ORDER — BORTEZOMIB CHEMO SQ INJECTION 3.5 MG (2.5MG/ML)
1.7500 mg | Freq: Once | INTRAMUSCULAR | Status: AC
  Administered 2013-03-06: 1.75 mg via SUBCUTANEOUS
  Filled 2013-03-06: qty 1.75

## 2013-03-06 MED ORDER — CYANOCOBALAMIN 1000 MCG/ML IJ SOLN
INTRAMUSCULAR | Status: AC
  Filled 2013-03-06: qty 1

## 2013-03-06 MED ORDER — DEXAMETHASONE SODIUM PHOSPHATE 20 MG/5ML IJ SOLN
20.0000 mg | Freq: Once | INTRAMUSCULAR | Status: AC
  Administered 2013-03-06: 20 mg via INTRAVENOUS

## 2013-03-06 MED ORDER — DEXAMETHASONE SODIUM PHOSPHATE 20 MG/5ML IJ SOLN
INTRAMUSCULAR | Status: AC
  Filled 2013-03-06: qty 5

## 2013-03-06 MED ORDER — SODIUM CHLORIDE 0.9 % IV SOLN
400.0000 mg | Freq: Once | INTRAVENOUS | Status: AC
  Administered 2013-03-06: 400 mg via INTRAVENOUS
  Filled 2013-03-06: qty 20

## 2013-03-06 MED ORDER — ONDANSETRON 8 MG/NS 50 ML IVPB
INTRAVENOUS | Status: AC
  Filled 2013-03-06: qty 8

## 2013-03-06 MED ORDER — CYANOCOBALAMIN 1000 MCG/ML IJ SOLN
1000.0000 ug | Freq: Once | INTRAMUSCULAR | Status: AC
  Administered 2013-03-06: 1000 ug via INTRAMUSCULAR

## 2013-03-06 NOTE — Patient Instructions (Signed)
[facility] Discharge Instructions for Patients Receiving Chemotherapy  Today you received the following chemotherapy agents Velcade and Cytoxan.  To help prevent nausea and vomiting after your treatment, we encourage you to take your nausea medication.   If you develop nausea and vomiting that is not controlled by your nausea medication, call the clinic.   BELOW ARE SYMPTOMS THAT SHOULD BE REPORTED IMMEDIATELY:  *FEVER GREATER THAN 100.5 F  *CHILLS WITH OR WITHOUT FEVER  NAUSEA AND VOMITING THAT IS NOT CONTROLLED WITH YOUR NAUSEA MEDICATION  *UNUSUAL SHORTNESS OF BREATH  *UNUSUAL BRUISING OR BLEEDING  TENDERNESS IN MOUTH AND THROAT WITH OR WITHOUT PRESENCE OF ULCERS  *URINARY PROBLEMS  *BOWEL PROBLEMS  UNUSUAL RASH Items with * indicate a potential emergency and should be followed up as soon as possible.  Feel free to call the clinic you have any questions or concerns. The clinic phone number is [phone].

## 2013-03-12 ENCOUNTER — Other Ambulatory Visit: Payer: Self-pay

## 2013-03-12 DIAGNOSIS — C9000 Multiple myeloma not having achieved remission: Secondary | ICD-10-CM

## 2013-03-12 MED ORDER — HYDROCODONE-ACETAMINOPHEN 10-325 MG PO TABS: 1.0000 | ORAL_TABLET | Freq: Four times a day (QID) | ORAL | Status: DC | PRN

## 2013-03-19 ENCOUNTER — Other Ambulatory Visit: Payer: Medicare Other | Admitting: Lab

## 2013-03-20 ENCOUNTER — Telehealth: Payer: Self-pay | Admitting: *Deleted

## 2013-03-20 ENCOUNTER — Ambulatory Visit (HOSPITAL_BASED_OUTPATIENT_CLINIC_OR_DEPARTMENT_OTHER): Payer: Medicare Other

## 2013-03-20 ENCOUNTER — Other Ambulatory Visit (HOSPITAL_BASED_OUTPATIENT_CLINIC_OR_DEPARTMENT_OTHER): Payer: Medicare Other | Admitting: Lab

## 2013-03-20 ENCOUNTER — Ambulatory Visit (HOSPITAL_BASED_OUTPATIENT_CLINIC_OR_DEPARTMENT_OTHER): Payer: Medicare Other | Admitting: Internal Medicine

## 2013-03-20 ENCOUNTER — Other Ambulatory Visit: Payer: Self-pay | Admitting: Internal Medicine

## 2013-03-20 ENCOUNTER — Telehealth: Payer: Self-pay | Admitting: Internal Medicine

## 2013-03-20 VITALS — BP 102/57 | HR 76 | Temp 98.1°F | Resp 17 | Ht <= 58 in | Wt 99.5 lb

## 2013-03-20 DIAGNOSIS — D509 Iron deficiency anemia, unspecified: Secondary | ICD-10-CM

## 2013-03-20 DIAGNOSIS — C9000 Multiple myeloma not having achieved remission: Secondary | ICD-10-CM

## 2013-03-20 DIAGNOSIS — Z5112 Encounter for antineoplastic immunotherapy: Secondary | ICD-10-CM

## 2013-03-20 DIAGNOSIS — D519 Vitamin B12 deficiency anemia, unspecified: Secondary | ICD-10-CM

## 2013-03-20 DIAGNOSIS — Z5111 Encounter for antineoplastic chemotherapy: Secondary | ICD-10-CM

## 2013-03-20 DIAGNOSIS — D649 Anemia, unspecified: Secondary | ICD-10-CM

## 2013-03-20 DIAGNOSIS — J029 Acute pharyngitis, unspecified: Secondary | ICD-10-CM

## 2013-03-20 DIAGNOSIS — E559 Vitamin D deficiency, unspecified: Secondary | ICD-10-CM

## 2013-03-20 DIAGNOSIS — E538 Deficiency of other specified B group vitamins: Secondary | ICD-10-CM

## 2013-03-20 LAB — CBC WITH DIFFERENTIAL/PLATELET
Basophils Absolute: 0 10*3/uL (ref 0.0–0.1)
Eosinophils Absolute: 0.1 10*3/uL (ref 0.0–0.5)
HCT: 32.4 % — ABNORMAL LOW (ref 34.8–46.6)
HGB: 10.8 g/dL — ABNORMAL LOW (ref 11.6–15.9)
LYMPH%: 12.9 % — ABNORMAL LOW (ref 14.0–49.7)
MCV: 105.5 fL — ABNORMAL HIGH (ref 79.5–101.0)
MONO%: 10.2 % (ref 0.0–14.0)
NEUT#: 3.8 10*3/uL (ref 1.5–6.5)
RDW: 15.4 % — ABNORMAL HIGH (ref 11.2–14.5)
lymph#: 0.7 10*3/uL — ABNORMAL LOW (ref 0.9–3.3)

## 2013-03-20 LAB — COMPREHENSIVE METABOLIC PANEL (CC13)
Albumin: 3.4 g/dL — ABNORMAL LOW (ref 3.5–5.0)
Alkaline Phosphatase: 54 U/L (ref 40–150)
BUN: 9.9 mg/dL (ref 7.0–26.0)
Calcium: 10 mg/dL (ref 8.4–10.4)
Chloride: 101 mEq/L (ref 98–109)
Glucose: 182 mg/dl — ABNORMAL HIGH (ref 70–140)
Potassium: 4.1 mEq/L (ref 3.5–5.1)

## 2013-03-20 MED ORDER — SODIUM CHLORIDE 0.9 % IV SOLN
400.0000 mg | Freq: Once | INTRAVENOUS | Status: AC
  Administered 2013-03-20: 400 mg via INTRAVENOUS
  Filled 2013-03-20: qty 20

## 2013-03-20 MED ORDER — ONDANSETRON 8 MG/50ML IVPB (CHCC)
8.0000 mg | Freq: Once | INTRAVENOUS | Status: AC
  Administered 2013-03-20: 8 mg via INTRAVENOUS

## 2013-03-20 MED ORDER — ONDANSETRON 8 MG/NS 50 ML IVPB
INTRAVENOUS | Status: AC
  Filled 2013-03-20: qty 8

## 2013-03-20 MED ORDER — BORTEZOMIB CHEMO SQ INJECTION 3.5 MG (2.5MG/ML)
1.7500 mg | Freq: Once | INTRAMUSCULAR | Status: AC
  Administered 2013-03-20: 1.75 mg via SUBCUTANEOUS
  Filled 2013-03-20: qty 1.75

## 2013-03-20 MED ORDER — DEXAMETHASONE SODIUM PHOSPHATE 20 MG/5ML IJ SOLN
20.0000 mg | Freq: Once | INTRAMUSCULAR | Status: AC
  Administered 2013-03-20: 20 mg via INTRAVENOUS

## 2013-03-20 MED ORDER — DEXAMETHASONE SODIUM PHOSPHATE 20 MG/5ML IJ SOLN
INTRAMUSCULAR | Status: AC
  Filled 2013-03-20: qty 5

## 2013-03-20 NOTE — Patient Instructions (Signed)
[facility] Discharge Instructions for Patients Receiving Chemotherapy  Today you received the following chemotherapy agents cytoxan/Velcade.  To help prevent nausea and vomiting after your treatment, we encourage you to take your nausea medication as prescribed.   If you develop nausea and vomiting that is not controlled by your nausea medication, call the clinic.   BELOW ARE SYMPTOMS THAT SHOULD BE REPORTED IMMEDIATELY:  *FEVER GREATER THAN 100.5 F  *CHILLS WITH OR WITHOUT FEVER  NAUSEA AND VOMITING THAT IS NOT CONTROLLED WITH YOUR NAUSEA MEDICATION  *UNUSUAL SHORTNESS OF BREATH  *UNUSUAL BRUISING OR BLEEDING  TENDERNESS IN MOUTH AND THROAT WITH OR WITHOUT PRESENCE OF ULCERS  *URINARY PROBLEMS  *BOWEL PROBLEMS  UNUSUAL RASH Items with * indicate a potential emergency and should be followed up as soon as possible.  Feel free to call the clinic you have any questions or concerns. The clinic phone number is [phone].

## 2013-03-20 NOTE — Telephone Encounter (Signed)
Per staff message and POF I have scheduled appts.  [name]  

## 2013-03-20 NOTE — Telephone Encounter (Signed)
appts made email to MW to schedule tx avs and cal gvn to pt shh

## 2013-03-20 NOTE — Progress Notes (Signed)
[facility] OFFICE PROGRESS NOTE  Christina Blamer, MD [address], [address] [place] [address]  DIAGNOSIS: Multiple myeloma - Plan: CBC with Differential in 1 month, Comprehensive metabolic panel, CBC with Differential, Kappa/lambda light chains, SPEP & IFE with QIG  Anemia, vitamin B12 deficiency  Anemia, iron deficiency  Vitamin D deficiency  Chief Complaint  Patient presents with  . Multiple Myeloma   DIAGNOSIS:  1. Multiple myeloma.  2. Anemia secondary to vitamin B12 deficiency.   PAST THERAPY:  Revlimid (08/2009-08/2012)  Zometa (02/2010 - 02/2012)  Velcade (05/2011 -present)  Cytoxan 08/2012 - present)  Decadron (08/2012 - present)   CURRENT THERAPY:  1. Velcade, Cytoxan and Decadron every 2 weeks from 08/29/2012.  2. Vitamin B12 mg IM monthly.  3. Aranesp 300 mcg subcu every 2 weeks for hemoglobin less than or equal to 10.   Interim History: The patient is a pleasant 79 y.o. female who presented for follow up visit. She was last seen by me on 02/20/2013. Her appetite and weight is stable. Patient had double and blurry vision and had successful cataract surgery this past September. She is happy about purchasing her new eyeglasses recently.  She still  has diarrhea for which he takes Imodium. She reported pain in back,neck and shoulders; numbness in fingers and feet, and dry skin. He reports receiving the flu shot from Dr. Tiburcio Pea' office. She denies any sent hospitalizations or emergency room visits. She has a mild sore throat without fever or change in her voice.   The patient denied fever, chills, night sweats, change in appetite or weight. She denied odynophagia or dysphagia. No chest pain, palpitations, abdominal pain, vomiting,constipation, hematochezia. The patient denied dysuria, nocturia, polyuria, hematuria, myalgia, tingling, psychiatric problems.   MEDICAL HISTORY: Past Medical History  Diagnosis Date  . Hypertension   . Diabetes  mellitus   . Anemia   . Osteoporosis   . Dyslipidemia   . Myocardial infarction   . colon ca dx'd 2003    surg only  . Multiple myeloma dx'd 2009    oral chemo ongoing  . Coronary artery disease   . Shortness of breath   . GERD (gastroesophageal reflux disease)   . Wears glasses     INTERIM HISTORY: has Multiple myeloma; Anemia, vitamin B12 deficiency; Anemia, iron deficiency; Vitamin d deficiency; Magnesium deficiency; Diabetes mellitus; Hypertension; Dyslipidemia; Coronary artery disease; Vitamin B12 deficiency; Hypotension; Encephalopathy; TIA (transient ischemic attack); and Vocal cord paralysis, unilateral complete on her problem list.    ALLERGIES:  is allergic to gemfibrozil; nifedipine; and questran.  MEDICATIONS: has a current medication list which includes the following prescription(s): acetaminophen, acyclovir, magic mouthwash, aspirin ec, carvedilol, vitamin d, vitamin d-3, cyanocobalamin, cyclobenzaprine, darbepoetin, hydrocodone-acetaminophen, lisinopril, metformin, multivitamin with minerals, pantoprazole, polyethyl glycol-propyl glycol, PRESCRIPTION MEDICATION, prochlorperazine, sodium fluoride, and spironolactone, and the following Facility-Administered Medications: darbepoetin.  SURGICAL HISTORY:  Past Surgical History  Procedure Laterality Date  . Coronary artery bypass graft  08/2001    4 vessel  . Colon surgery  2003  . Tonsillectomy    . Appendectomy    . Abdominal hysterectomy    . Back surgery    . Eye surgery      cataracts  . Direct laryngoscopy with radiaesse injection Left 08/13/2012    Procedure: LEFT VOCAL CORD RADIESSE AUGMENTATION LARYNGOSCOPY ;  Surgeon: Flo Shanks, MD;  Location: [facility];  Service: ENT;  Laterality: Left;   Problem List:  1. Multiple myeloma, initially  presenting as an IgA kappa monoclonal gammopathy in February 2009. 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 This was felt to be an adverse prognostic  determinant. Bone marrow on 08/31/2007 showed 46% plasma cells. A repeat bone marrow on 08/05/2009 showed 73% plasma cells. Treatment with Revlimid was started in April 2011. Zometa was started in October 2011 and 2 years of treatment were concluded on 02/23/2012. We had previously started Aranesp for the patient's anemia in May of 2010. Most recent metastatic bone survey was on 08/05/2009. Other x-rays since then are available. Maximum IgA level was 2080 on 08/11/2009. Subcutaneous Velcade was added to the patient's treatment program on 06/14/2011 because of a rising IgA level 1080 on 06/02/2011. The patient had been receiving Velcade every 2 weeks in combination with Revlimid during the early months of 2014. The patient continues to demonstrate progressive disease as evidenced by rising IgA and serum kappa levels. Revlimid was discontinued in April 2014. The patient had received Revlimid for about 3 years from April 2011 through April 2014. As of 08/29/2012, the patient is now receiving Velcade, Cytoxan and Decadron every 2 weeks. The patient has also been receiving Aranesp 300 mcg subcu every 2 weeks for hemoglobin less than or equal to 10.  2. Anemia secondary to vitamin B12 deficiency. The patient had been on oral vitamin B12. However, her vitamin B12 level fell to 284 on 07/26/2011 and vitamin B12 shots 1000 mcg given IM every month was resumed on 09/06/2011.  3. Anemia secondary to iron deficiency.   REVIEW OF SYSTEMS:   Constitutional: Denies fevers, chills or abnormal weight loss Eyes: Denies blurriness of vision Ears, nose, mouth, throat, and face: Denies mucositis or sore throat Respiratory: Denies cough, dyspnea or wheezes Cardiovascular: Denies palpitation, chest discomfort or lower extremity swelling Gastrointestinal:  Denies nausea, heartburn;  positive for constipation Skin: Denies abnormal skin rashes Lymphatics: Denies new lymphadenopathy or easy bruising Neurological:Denies numbness,  tingling or new weaknesses Behavioral/Psych: Mood is stable, no new changes  All other systems were reviewed with the patient and are negative.  PHYSICAL EXAMINATION: ECOG PERFORMANCE STATUS: 1 - Symptomatic but completely ambulatory  Blood pressure 102/57, pulse 76, temperature 98.1 F (36.7 C), temperature source Oral, resp. rate 17, height 4\' 9"  (1.448 m), weight 99 lb 8 oz (45.133 kg), SpO2 97.00%.  GENERAL:alert, no distress and comfortable; Diminished hearing  SKIN: skin color, texture, turgor are normal, no rashes or significant lesions EYES: normal, Conjunctiva are pink and non-injected, sclera clear OROPHARYNX:no exudate, no erythema and lips, buccal mucosa, and tongue normal  NECK: supple, thyroid normal size, non-tender, without nodularity LYMPH:  no palpable lymphadenopathy in the cervical, axillary or supraclavicular LUNGS: clear to auscultation and percussion with normal breathing effort HEART: regular rate & rhythm and no murmurs and no lower extremity edema ABDOMEN:abdomen soft, non-tender and normal bowel sounds Musculoskeletal:no cyanosis of digits and no clubbing  NEURO: alert & oriented x 3 with fluent speech, no focal motor/sensory deficits   LABORATORY DATA: Results for orders placed in visit on 03/20/13 (from the past 48 hour(s))  CBC WITH DIFFERENTIAL     Status: Abnormal   Collection Time    03/20/13  8:05 AM      Result Value Range   WBC 5.1  3.9 - 10.3 10e3/uL   NEUT# 3.8  1.5 - 6.5 10e3/uL   HGB 10.8 (*) 11.6 - 15.9 g/dL   HCT 16.1 (*) 09.6 - 04.5 %   Platelets 153  145 - 400 10e3/uL  MCV 105.5 (*) 79.5 - 101.0 fL   MCH 35.3 (*) 25.1 - 34.0 pg   MCHC 33.5  31.5 - 36.0 g/dL   RBC 4.54 (*) 0.98 - 1.19 10e6/uL   RDW 15.4 (*) 11.2 - 14.5 %   lymph# 0.7 (*) 0.9 - 3.3 10e3/uL   MONO# 0.5  0.1 - 0.9 10e3/uL   Eosinophils Absolute 0.1  0.0 - 0.5 10e3/uL   Basophils Absolute 0.0  0.0 - 0.1 10e3/uL   NEUT% 75.3  38.4 - 76.8 %   LYMPH% 12.9 (*) 14.0 -  49.7 %   MONO% 10.2  0.0 - 14.0 %   EOS% 1.0  0.0 - 7.0 %   BASO% 0.6  0.0 - 2.0 %  COMPREHENSIVE METABOLIC PANEL (CC13)     Status: Abnormal   Collection Time    03/20/13  8:05 AM      Result Value Range   Sodium 138  136 - 145 mEq/L   Potassium 4.1  3.5 - 5.1 mEq/L   Chloride 101  98 - 109 mEq/L   CO2 24  22 - 29 mEq/L   Glucose 182 (*) 70 - 140 mg/dl   BUN 9.9  7.0 - 14.7 mg/dL   Creatinine 0.7  0.6 - 1.1 mg/dL   Total Bilirubin 8.29  0.20 - 1.20 mg/dL   Alkaline Phosphatase 54  40 - 150 U/L   AST 14  5 - 34 U/L   ALT 9  0 - 55 U/L   Total Protein 6.4  6.4 - 8.3 g/dL   Albumin 3.4 (*) 3.5 - 5.0 g/dL   Calcium 56.2  8.4 - 13.0 mg/dL   Anion Gap 12 (*) 3 - 11 mEq/L    Results for Christina, Hopkins (MRN [id number]) as of 03/20/2013 09:11  Ref. Range 10/18/2012 10:51 11/13/2012 10:56 12/11/2012 08:20 01/08/2013 08:18 02/20/2013 08:38  IgA Latest Range: 69-380 mg/dL 2952 (H) 841 (H) 324 (H) 505 (H) 428 (H)   Lab Results  Component Value Date   WBC 5.1 03/20/2013   HGB 10.8* 03/20/2013   HCT 32.4* 03/20/2013   MCV 105.5* 03/20/2013   PLT 153 03/20/2013   NEUTROABS 3.8 03/20/2013      Chemistry      Component Value Date/Time   NA 138 03/20/2013 0805   NA 124* 09/05/2012 1000   K 4.1 03/20/2013 0805   K 3.9 09/05/2012 1000   CL 101 10/18/2012 1051   CL 89* 09/05/2012 1000   CO2 24 03/20/2013 0805   CO2 25 09/05/2012 1000   BUN 9.9 03/20/2013 0805   BUN 27* 09/05/2012 1000   CREATININE 0.7 03/20/2013 0805   CREATININE 1.15* 09/05/2012 1000   CREATININE 0.69 08/11/2009 1145      Component Value Date/Time   CALCIUM 10.0 03/20/2013 0805   CALCIUM 8.9 09/05/2012 1000   ALKPHOS 54 03/20/2013 0805   ALKPHOS 58 09/05/2012 1000   AST 14 03/20/2013 0805   AST 18 09/05/2012 1000   ALT 9 03/20/2013 0805   ALT 12 09/05/2012 1000   BILITOT 0.50 03/20/2013 0805   BILITOT 0.5 09/05/2012 1000     CBC:  Recent Labs Lab 03/20/13 0805  WBC 5.1  NEUTROABS 3.8  HGB 10.8*  HCT 32.4*  MCV 105.5*  PLT 153    RADIOGRAPHIC STUDIES: METASTATIC BONE SURVEY 11/01/2012 Comparison: 08/05/2009  Correlation: Chest and abdominal radiographs 09/05/2012, CT chest  07/25/2012  Findings:  Bones appear demineralized.  Extensive scattered atherosclerotic calcifications.  Post CABG.  Severe multilevel degenerative disc and facet disease changes of the cervicothoracic and lumbar spine.  Prior vertebroplasties at T11, T12, L1.  Endplate compression deformities at T6, T7, and T10 unchanged.  Inferior endplate compression deformity L3 new since radiographs 2011.  No definite new thoracic compression fracture.  Bowel gas projects over the right inferior pubic ramus and  obturator foramen question obturator hernia. Bilateral  acromioclavicular and glenohumeral joint degenerative changes with bilateral chronic rotator cuff tears.  Degenerative changes bilateral knees.  Hiatal hernia.  Callus identified at a probable healing fracture of a lateral lower left rib, new.  IMPRESSION:  Osseous demineralization with multiple thoracolumbar compression fractures and prior vertebroplasties.  Inferior endplate compression deformity L3 new since 2011 though of uncertain age. No definite destructive lytic lesion identified to suggest new multiple myelomatous site.  Scattered degenerative changes as above.  Small hiatal hernia.  Extensive atherosclerotic disease.  Question right obturator hernia.    ASSESSMENT: Christina Hopkins 79 y.o. female with a history of Multiple myeloma - Plan: CBC with Differential in 1 month, Comprehensive metabolic panel, CBC with Differential, Kappa/lambda light chains, SPEP & IFE with QIG  Anemia, vitamin B12 deficiency  Anemia, iron deficiency  Vitamin D deficiency   PLAN:  1. Multiple myeloma. --She continues to do well overall. -- Her IgA level and serum light chains have improved on current regiment. Her IgA level is 428 down from 505 in 01/08/2013.  The IgA level from today is  pending.  --We reviewed her labs and we will proceed with chemotherapy with Velcade subcutaneous, Cytoxan 400 mg, IV Decadron 20 mg IV (Day #1, cycle #38). She has been provided a handout explaining common side-effects on prior visits. .   2. Leukopenia likely secondary to chemotherapy, resolved.   3. Sore throat, mild. --Mild without erythema at the base of her throat.   Advised to continue supportive care and call if progresses.  She will do cephacol throat lozenges.   4. Anemia  Vitamin B12 deficiency. Given vitamin B12 IM last visit.  The patient doesn't need Aranesp today a summer hemoglobin 10.8.   5. Follow up. RTC in 4 weeks for evaluation for another treatment with CBC, CMET, IgA level, and serum light chains.  All questions were answered. The patient knows to call the clinic with any problems, questions or concerns. We can certainly see the patient much sooner if necessary.  I spent 10 minutes counseling the patient face to face. The total time spent in the appointment was 15 minutes.    Elana Jian, MD 03/20/2013 9:14 AM

## 2013-03-21 LAB — KAPPA/LAMBDA LIGHT CHAINS
Kappa:Lambda Ratio: 8.32 — ABNORMAL HIGH (ref 0.26–1.65)
Lambda Free Lght Chn: 0.92 mg/dL (ref 0.57–2.63)

## 2013-03-21 LAB — IGG, IGA, IGM
IgA: 449 mg/dL — ABNORMAL HIGH (ref 69–380)
IgG (Immunoglobin G), Serum: 392 mg/dL — ABNORMAL LOW (ref 690–1700)
IgM, Serum: <4 mg/dL — ABNORMAL LOW (ref 52–322)

## 2013-03-23 ENCOUNTER — Emergency Department (HOSPITAL_COMMUNITY): Payer: Medicare Other

## 2013-03-23 ENCOUNTER — Inpatient Hospital Stay (HOSPITAL_COMMUNITY)
Admission: EM | Admit: 2013-03-23 | Discharge: 2013-03-28 | DRG: 205 | Disposition: A | Discharge status: Skilled Nursing Facility | Payer: Medicare Other | Attending: Internal Medicine | Admitting: Internal Medicine

## 2013-03-23 ENCOUNTER — Other Ambulatory Visit: Payer: Self-pay

## 2013-03-23 ENCOUNTER — Encounter (HOSPITAL_COMMUNITY): Payer: Self-pay | Admitting: Emergency Medicine

## 2013-03-23 DIAGNOSIS — W19XXXA Unspecified fall, initial encounter: Secondary | ICD-10-CM

## 2013-03-23 DIAGNOSIS — J189 Pneumonia, unspecified organism: Secondary | ICD-10-CM

## 2013-03-23 DIAGNOSIS — D638 Anemia in other chronic diseases classified elsewhere: Secondary | ICD-10-CM | POA: Diagnosis present

## 2013-03-23 DIAGNOSIS — W19XXXS Unspecified fall, sequela: Secondary | ICD-10-CM

## 2013-03-23 DIAGNOSIS — S2249XA Multiple fractures of ribs, unspecified side, initial encounter for closed fracture: Secondary | ICD-10-CM

## 2013-03-23 DIAGNOSIS — E612 Magnesium deficiency: Secondary | ICD-10-CM

## 2013-03-23 DIAGNOSIS — I10 Essential (primary) hypertension: Secondary | ICD-10-CM

## 2013-03-23 DIAGNOSIS — D509 Iron deficiency anemia, unspecified: Secondary | ICD-10-CM

## 2013-03-23 DIAGNOSIS — S2232XA Fracture of one rib, left side, initial encounter for closed fracture: Secondary | ICD-10-CM

## 2013-03-23 DIAGNOSIS — S2239XA Fracture of one rib, unspecified side, initial encounter for closed fracture: Secondary | ICD-10-CM

## 2013-03-23 DIAGNOSIS — D696 Thrombocytopenia, unspecified: Secondary | ICD-10-CM | POA: Diagnosis present

## 2013-03-23 DIAGNOSIS — S2242XK Multiple fractures of ribs, left side, subsequent encounter for fracture with nonunion: Secondary | ICD-10-CM

## 2013-03-23 DIAGNOSIS — E119 Type 2 diabetes mellitus without complications: Secondary | ICD-10-CM

## 2013-03-23 DIAGNOSIS — D518 Other vitamin B12 deficiency anemias: Secondary | ICD-10-CM

## 2013-03-23 DIAGNOSIS — R0902 Hypoxemia: Secondary | ICD-10-CM

## 2013-03-23 DIAGNOSIS — D519 Vitamin B12 deficiency anemia, unspecified: Secondary | ICD-10-CM

## 2013-03-23 DIAGNOSIS — J9811 Atelectasis: Secondary | ICD-10-CM

## 2013-03-23 DIAGNOSIS — E440 Moderate protein-calorie malnutrition: Secondary | ICD-10-CM | POA: Diagnosis present

## 2013-03-23 DIAGNOSIS — E871 Hypo-osmolality and hyponatremia: Secondary | ICD-10-CM

## 2013-03-23 DIAGNOSIS — C9000 Multiple myeloma not having achieved remission: Secondary | ICD-10-CM

## 2013-03-23 DIAGNOSIS — I2510 Atherosclerotic heart disease of native coronary artery without angina pectoris: Secondary | ICD-10-CM

## 2013-03-23 LAB — BASIC METABOLIC PANEL
BUN: 11 mg/dL (ref 6–23)
CO2: 26 mEq/L (ref 19–32)
Calcium: 9.9 mg/dL (ref 8.4–10.5)
Creatinine, Ser: 0.59 mg/dL (ref 0.50–1.10)
Glucose, Bld: 161 mg/dL — ABNORMAL HIGH (ref 70–99)
Sodium: 131 mEq/L — ABNORMAL LOW (ref 135–145)

## 2013-03-23 LAB — GLUCOSE, CAPILLARY

## 2013-03-23 LAB — CBC
HCT: 32.6 % — ABNORMAL LOW (ref 36.0–46.0)
Hemoglobin: 11.1 g/dL — ABNORMAL LOW (ref 12.0–15.0)
MCH: 35.5 pg — ABNORMAL HIGH (ref 26.0–34.0)
MCHC: 34 g/dL (ref 30.0–36.0)
MCV: 104.2 fL — ABNORMAL HIGH (ref 78.0–100.0)
RBC: 3.13 MIL/uL — ABNORMAL LOW (ref 3.87–5.11)
WBC: 4.5 10*3/uL (ref 4.0–10.5)

## 2013-03-23 LAB — PHOSPHORUS: Phosphorus: 3.6 mg/dL (ref 2.3–4.6)

## 2013-03-23 LAB — TSH: TSH: 1.44 u[IU]/mL (ref 0.350–4.500)

## 2013-03-23 LAB — MAGNESIUM: Magnesium: 0.9 mg/dL — CL (ref 1.5–2.5)

## 2013-03-23 LAB — HIV ANTIBODY (ROUTINE TESTING W REFLEX): HIV: NONREACTIVE

## 2013-03-23 LAB — PRO B NATRIURETIC PEPTIDE: Pro B Natriuretic peptide (BNP): 2180 pg/mL — ABNORMAL HIGH (ref 0–450)

## 2013-03-23 MED ORDER — VANCOMYCIN HCL IN DEXTROSE 1-5 GM/200ML-% IV SOLN
1000.0000 mg | Freq: Once | INTRAVENOUS | Status: AC
  Administered 2013-03-23: 13:00:00 1000 mg via INTRAVENOUS
  Filled 2013-03-23: qty 200

## 2013-03-23 MED ORDER — LISINOPRIL 2.5 MG PO TABS
2.5000 mg | ORAL_TABLET | ORAL | Status: DC
  Administered 2013-03-24 – 2013-03-28 (×3): 2.5 mg via ORAL
  Filled 2013-03-23 (×3): qty 1

## 2013-03-23 MED ORDER — MAGNESIUM SULFATE 40 MG/ML IJ SOLN
4.0000 g | Freq: Once | INTRAMUSCULAR | Status: AC
  Administered 2013-03-23: 4 g via INTRAVENOUS
  Filled 2013-03-23: qty 100

## 2013-03-23 MED ORDER — PANTOPRAZOLE SODIUM 40 MG PO TBEC
40.0000 mg | DELAYED_RELEASE_TABLET | Freq: Every day | ORAL | Status: DC
  Administered 2013-03-23 – 2013-03-28 (×6): 40 mg via ORAL
  Filled 2013-03-23 (×6): qty 1

## 2013-03-23 MED ORDER — FENTANYL CITRATE 0.05 MG/ML IJ SOLN
50.0000 ug | Freq: Once | INTRAMUSCULAR | Status: AC
  Administered 2013-03-23: 50 ug via INTRAVENOUS
  Filled 2013-03-23: qty 2

## 2013-03-23 MED ORDER — SODIUM CHLORIDE 0.9 % IV SOLN: 250.0000 mL | INTRAVENOUS | Status: DC | PRN

## 2013-03-23 MED ORDER — OXYCODONE-ACETAMINOPHEN 5-325 MG PO TABS
1.0000 | ORAL_TABLET | ORAL | Status: DC | PRN
Start: 2013-03-23 — End: 2013-03-28
  Administered 2013-03-23 – 2013-03-25 (×7): 1 via ORAL
  Administered 2013-03-25: 2 via ORAL
  Administered 2013-03-26 – 2013-03-28 (×12): 1 via ORAL
  Filled 2013-03-23 (×7): qty 1
  Filled 2013-03-23: qty 2
  Filled 2013-03-23 (×11): qty 1

## 2013-03-23 MED ORDER — SODIUM CHLORIDE 0.9 % IJ SOLN: 3.0000 mL | INTRAMUSCULAR | Status: DC | PRN

## 2013-03-23 MED ORDER — SPIRONOLACTONE 25 MG PO TABS
25.0000 mg | ORAL_TABLET | ORAL | Status: DC
  Administered 2013-03-23 – 2013-03-27 (×3): 25 mg via ORAL
  Filled 2013-03-23 (×3): qty 1

## 2013-03-23 MED ORDER — VANCOMYCIN HCL IN DEXTROSE 750-5 MG/150ML-% IV SOLN
750.0000 mg | INTRAVENOUS | Status: DC
  Administered 2013-03-24 – 2013-03-27 (×4): 750 mg via INTRAVENOUS
  Filled 2013-03-23 (×4): qty 150

## 2013-03-23 MED ORDER — DEXTROSE 5 % IV SOLN
1.0000 g | INTRAVENOUS | Status: DC
  Administered 2013-03-23 – 2013-03-27 (×5): 1 g via INTRAVENOUS
  Filled 2013-03-23 (×5): qty 1

## 2013-03-23 MED ORDER — SODIUM CHLORIDE 0.9 % IV BOLUS (SEPSIS)
500.0000 mL | Freq: Once | INTRAVENOUS | Status: AC
  Administered 2013-03-23: 500 mL via INTRAVENOUS

## 2013-03-23 MED ORDER — HYDROMORPHONE HCL PF 1 MG/ML IJ SOLN
1.0000 mg | INTRAMUSCULAR | Status: DC | PRN
  Administered 2013-03-23: 1 mg via INTRAVENOUS
  Filled 2013-03-23: qty 1

## 2013-03-23 MED ORDER — DEXTROSE 5 % IV SOLN: 1.0000 g | INTRAVENOUS | Status: DC

## 2013-03-23 MED ORDER — ENOXAPARIN SODIUM 40 MG/0.4ML ~~LOC~~ SOLN
40.0000 mg | SUBCUTANEOUS | Status: DC
  Administered 2013-03-23: 12:00:00 40 mg via SUBCUTANEOUS
  Filled 2013-03-23 (×2): qty 0.4

## 2013-03-23 MED ORDER — MAGIC MOUTHWASH
5.0000 mL | Freq: Four times a day (QID) | ORAL | Status: DC | PRN
  Filled 2013-03-23: qty 5

## 2013-03-23 MED ORDER — ASPIRIN EC 81 MG PO TBEC
81.0000 mg | DELAYED_RELEASE_TABLET | Freq: Every day | ORAL | Status: DC
  Administered 2013-03-23 – 2013-03-28 (×6): 81 mg via ORAL
  Filled 2013-03-23 (×6): qty 1

## 2013-03-23 MED ORDER — ACYCLOVIR 400 MG PO TABS
400.0000 mg | ORAL_TABLET | Freq: Two times a day (BID) | ORAL | Status: DC
  Administered 2013-03-23 – 2013-03-28 (×11): 400 mg via ORAL
  Filled 2013-03-23 (×12): qty 1

## 2013-03-23 MED ORDER — CYCLOBENZAPRINE HCL 10 MG PO TABS
10.0000 mg | ORAL_TABLET | Freq: Three times a day (TID) | ORAL | Status: DC | PRN
  Administered 2013-03-23 – 2013-03-27 (×2): 10 mg via ORAL
  Filled 2013-03-23 (×2): qty 1

## 2013-03-23 MED ORDER — IOHEXOL 300 MG/ML  SOLN
80.0000 mL | Freq: Once | INTRAMUSCULAR | Status: AC | PRN
  Administered 2013-03-23: 80 mL via INTRAVENOUS

## 2013-03-23 MED ORDER — PROCHLORPERAZINE MALEATE 10 MG PO TABS
10.0000 mg | ORAL_TABLET | Freq: Four times a day (QID) | ORAL | Status: DC | PRN
  Filled 2013-03-23: qty 1

## 2013-03-23 MED ORDER — SODIUM CHLORIDE 0.9 % IJ SOLN
3.0000 mL | Freq: Two times a day (BID) | INTRAMUSCULAR | Status: DC
  Administered 2013-03-24 – 2013-03-27 (×5): 3 mL via INTRAVENOUS

## 2013-03-23 MED ORDER — CARVEDILOL 25 MG PO TABS
25.0000 mg | ORAL_TABLET | Freq: Two times a day (BID) | ORAL | Status: DC
  Administered 2013-03-23 – 2013-03-28 (×10): 25 mg via ORAL
  Filled 2013-03-23 (×13): qty 1

## 2013-03-23 MED ORDER — METFORMIN HCL 500 MG PO TABS
500.0000 mg | ORAL_TABLET | Freq: Two times a day (BID) | ORAL | Status: DC
  Administered 2013-03-23 – 2013-03-25 (×5): 500 mg via ORAL
  Filled 2013-03-23 (×6): qty 1

## 2013-03-23 MED ORDER — AZITHROMYCIN 250 MG PO TABS: 500.0000 mg | ORAL_TABLET | ORAL | Status: DC

## 2013-03-23 MED ORDER — ONDANSETRON HCL 4 MG/2ML IJ SOLN: 4.0000 mg | Freq: Four times a day (QID) | INTRAMUSCULAR | Status: DC | PRN

## 2013-03-23 MED ORDER — HYDROMORPHONE HCL PF 1 MG/ML IJ SOLN
1.0000 mg | INTRAMUSCULAR | Status: DC | PRN
  Administered 2013-03-26 – 2013-03-27 (×2): 2 mg via INTRAVENOUS
  Administered 2013-03-27: 1 mg via INTRAVENOUS
  Administered 2013-03-27: 2 mg via INTRAVENOUS
  Filled 2013-03-23 (×4): qty 2

## 2013-03-23 MED ORDER — INSULIN ASPART 100 UNIT/ML ~~LOC~~ SOLN
0.0000 [IU] | Freq: Three times a day (TID) | SUBCUTANEOUS | Status: DC
  Administered 2013-03-23: 2 [IU] via SUBCUTANEOUS
  Administered 2013-03-23 – 2013-03-24 (×2): 1 [IU] via SUBCUTANEOUS
  Administered 2013-03-24: 2 [IU] via SUBCUTANEOUS
  Administered 2013-03-25 (×2): 1 [IU] via SUBCUTANEOUS
  Administered 2013-03-25 – 2013-03-26 (×3): 2 [IU] via SUBCUTANEOUS
  Administered 2013-03-27: 08:00:00 1 [IU] via SUBCUTANEOUS
  Administered 2013-03-27: 17:00:00 2 [IU] via SUBCUTANEOUS
  Administered 2013-03-28: 1 [IU] via SUBCUTANEOUS

## 2013-03-23 NOTE — ED Notes (Signed)
Pt from home, with daughter, states that she was in the BR at 0130 this morning trying to change clothes after having diarrhea, and fell, hitting her left side on the commode, especially c/o pain at her L ribs.  Pt CA pt, last received chemo on Wednesday, reports having diarrhea since then . Pt A&O at this time.

## 2013-03-23 NOTE — ED Notes (Addendum)
Pt aware waiting for bed placement, family left at this time

## 2013-03-23 NOTE — Progress Notes (Signed)
Ambulated patient in hallway approximately 200 feet oxygen saturation ranged from 87% to 93% on room air . Patient  states he felt slightly short of breath and a break was given saturation level came up to 93 and patient felt fine . Returned to bed and oxygen  applied vis nasal cannula at 2 liters.

## 2013-03-23 NOTE — ED Notes (Signed)
I have just given report to Selena Batten, RN on [address]; and will transport her there now with con't. ecg monitoring.

## 2013-03-23 NOTE — Progress Notes (Signed)
ANTIBIOTIC CONSULT NOTE - INITIAL  Pharmacy Consult for Vancomycin, Cefepime Indication: r/o HCAP  Allergies  Allergen Reactions  . Gemfibrozil Nausea And Vomiting  . Nifedipine Hives  . Questran [Cholestyramine] Other (See Comments)    Do not remember    Patient Measurements: Height: 4\' 9"  (144.8 cm) Weight: 101 lb 13.6 oz (46.2 kg) IBW/kg (Calculated) : 38.6  Labs:  Recent Labs  03/23/13 0240  WBC 4.5  HGB 11.1*  PLT 116*  CREATININE 0.59    Assessment: 20 yoF with h/o multiple myeloma currently on chemotherapy presented 11/8 s/p fall and reported diarrhea since last chemo 11/5. CT chest and CXR with bibasilar opacities - concerning for PNA. CT chest also showed acute minimally displaced fractures of the L posterior 10th and 11th ribs along with subacute healing fractures of the L lateral 7-9th ribs. MD ordered Azithro/Ceftriaxone for PNA but given recent chemotherapy, if truly PNA - pt would be classified as having HCAP.  MD contacted and change abx to Vancomycin and Cefepime for now.    PTA >> Acyclovir >>  11/8 >> Vanc >> 11/8 >> Cefepime >>   Tmax: afeb  WBCs: wnl Renal: Scr 0.59, CG 31, N 58  11/8 blood >> ordered 11/8 sputum >> ordered 11/8 legionella/strep pneumo >> ordered  Goal of Therapy:  Vancomycin trough level 15-20 mcg/ml  Plan:   Vancomycin 1g IV x 1 then 750 mg IV q24h   Cefepime 1g IV q24h  Pharmacy will f/u   Geoffry Paradise, PharmD, BCPS Pager: [phone] 9:55 AM Pharmacy #: [id number]

## 2013-03-23 NOTE — ED Notes (Signed)
Pt placed back on 2L [place], after O2 sats remained in the 80's.  Pt returned to 96%

## 2013-03-23 NOTE — H&P (Addendum)
[facility] History and Physical  Christina Hopkins ZOX:[id number] DOB: [birth year] DOA: 03/23/2013  Referring physician: ED physician PCP: Johny Blamer, MD   Chief Complaint: fall  HPI:  Pt is 79 yo female with HTN, DM, who presented to [facility] ED with main concern of bilateral chest area discomfort after an episode of fall she has sustained at home earlier this AM prior to this admission. She explains she got up this AM and went to bathroom, tried to reach her gown and she fell. She currently reports pain is persistent and sharp, 5/10 in severity and non radiating, worse with movement and deep breath, no specific alleviating factors. She denies recent sickness or hospitalizations, loves at home with daughter, she denies chest pain or shortness of breath, she has noticed productive cough of yellow sputum over the past several days with subjective fevers and chills. She denies similar event in the past, no focal neurological symptoms, no headaches or visual changes, no abdominal or urinary concerns.   In ED, pt found to be hemodynamically stable but on CT chest, left sided rib fracture noted and worrisome findings for PNA. [facility] asked to admit to telemetry bed for further evaluation and management.   Assessment and Plan:  Principal Problem:   Fall - likely mechanical with subsequent ribs fracture - monitor on tele - PT evaluation once pt more medically stable  Active Problems:   Magnesium deficiency - severe, will give 4 gm of Mg via IV today  - repeat Mg level in AM and supplement as indicated   Fracture of multiple ribs - supportive care, analgesia as needed - ortho consultation for recommendations    PNA (pneumonia) - given MM and active chemotherapy, will treat this as HCAP - place on Vancomycin and Maxipime - sputum analysis ordered, urine legionella and strep pneumo     Multiple myeloma - will notify Dr. Rosie Fate of pt's admission    Anemia, iron deficiency - Hg and Hct appear to  be stable and at baseline - CBC in AM   Diabetes mellitus - continue home medical regimen with Metformin and place on SSI    Coronary artery disease - continue Aspirin and Coreg    Hypertension - reasonable BP on admission - will monitor closely and will continue home regimen with Lisinopril, Coreg, Spironolactone    Code Status: Full Family Communication: Pt and daughter at bedside Disposition Plan: Admit to telemetry bed  Review of Systems:  Constitutional: Negative for diaphoresis.  HENT: Negative for hearing loss, ear pain, nosebleeds, congestion, sore throat, neck pain, tinnitus and ear discharge.   Eyes: Negative for blurred vision, double vision, photophobia, pain, discharge and redness.  Respiratory: Negative for hemoptysis, wheezing and stridor.   Cardiovascular: Negative for chest pain, palpitations, orthopnea, claudication and leg swelling.  Gastrointestinal: Negative for nausea, vomiting and abdominal pain. Negative for heartburn, constipation, blood in stool and melena.  Genitourinary: Negative for dysuria, urgency, frequency, hematuria and flank pain.  Musculoskeletal: Negative for myalgias.  Skin: Negative for itching and rash.  Neurological:  Negative for tingling, tremors, sensory change, speech change, focal weakness, loss of consciousness and headaches.  Endo/Heme/Allergies: Negative for environmental allergies and polydipsia. Does not bruise/bleed easily.  Psychiatric/Behavioral: Negative for suicidal ideas. The patient is not nervous/anxious.      Past Medical History  Diagnosis Date  . Hypertension   . Diabetes mellitus   . Anemia   . Osteoporosis   . Dyslipidemia   . Myocardial infarction   . colon  ca dx'd 2003    surg only  . Multiple myeloma dx'd 2009    oral chemo ongoing  . Coronary artery disease   . Shortness of breath   . GERD (gastroesophageal reflux disease)   . Wears glasses     Past Surgical History  Procedure Laterality Date  .  Coronary artery bypass graft  08/2001    4 vessel  . Colon surgery  2003  . Tonsillectomy    . Appendectomy    . Abdominal hysterectomy    . Back surgery    . Eye surgery      cataracts  . Direct laryngoscopy with radiaesse injection Left 08/13/2012    Procedure: LEFT VOCAL CORD RADIESSE AUGMENTATION LARYNGOSCOPY ;  Surgeon: Flo Shanks, MD;  [facility];  Service: ENT;  Laterality: Left;    Social History:  reports that she has never smoked. She has never used smokeless tobacco. She reports that she does not drink alcohol or use illicit drugs.  Allergies  Allergen Reactions  . Gemfibrozil Nausea And Vomiting  . Nifedipine Hives  . Questran [Cholestyramine] Other (See Comments)    Do not remember    Family History  Problem Relation Age of Onset  . Heart disease Brother   . Diabetes Brother     Prior to Admission medications   Medication Sig Start Date End Date Taking? Authorizing Provider  acetaminophen (TYLENOL) 650 MG CR tablet Take 650 mg by mouth every 8 (eight) hours as needed for pain.   Yes Historical Provider, MD  acyclovir (ZOVIRAX) 400 MG tablet Take 1 tablet (400 mg total) by mouth 2 (two) times daily. 02/15/13  Yes Myra Rude, MD  Alum & Mag Hydroxide-Simeth (MAGIC MOUTHWASH) SOLN Swish and spit 5 mLs 4 (four) times daily as needed (for mouth ulcers).    Yes Historical Provider, MD  aspirin EC 81 MG tablet Take 81 mg by mouth daily.   Yes Historical Provider, MD  carvedilol (COREG) 25 MG tablet Take 25 mg by mouth 2 (two) times daily with a meal.   Yes Historical Provider, MD  Cholecalciferol (VITAMIN D) 2000 UNITS tablet Take 2,000 Units by mouth at bedtime.   Yes Historical Provider, MD  Cholecalciferol (VITAMIN D-3) 5000 UNITS TABS Take 1 tablet by mouth at bedtime.   Yes Historical Provider, MD  cyanocobalamin (,VITAMIN B-12,) 1000 MCG/ML injection Inject 1,000 mcg into the muscle every 30 (thirty) days.   Yes Historical Provider, MD   cyclobenzaprine (FLEXERIL) 10 MG tablet Take 1 tablet (10 mg total) by mouth 3 (three) times daily as needed. For muscle spasm 10/05/12  Yes Samul Dada, MD  darbepoetin (ARANESP) 300 MCG/0.6ML SOLN Inject 300 mcg into the skin as needed (given if needed for anemia).   Yes Historical Provider, MD  HYDROcodone-acetaminophen (NORCO) 10-325 MG per tablet Take 1 tablet by mouth every 6 (six) hours as needed for pain. 03/12/13  Yes Myra Rude, MD  lisinopril (PRINIVIL,ZESTRIL) 2.5 MG tablet Take 2.5 mg by mouth every other day. 05/06/12  Yes Vassie Loll, MD  metFORMIN (GLUCOPHAGE) 500 MG tablet Take 500 mg by mouth 2 (two) times daily with a meal.     Yes Historical Provider, MD  Multiple Vitamin (MULTIVITAMIN WITH MINERALS) TABS Take 1 tablet by mouth daily.   Yes Historical Provider, MD  pantoprazole (PROTONIX) 40 MG tablet Take 40 mg by mouth daily.     Yes Historical Provider, MD  Polyethyl Glycol-Propyl Glycol (SYSTANE OP) Place 2  drops into both eyes at bedtime.    Yes Historical Provider, MD  PRESCRIPTION MEDICATION Inject into the vein every 7 (seven) days. Velcade and Cytoxan infusion q7d on Wednesdays.   Yes Historical Provider, MD  prochlorperazine (COMPAZINE) 10 MG tablet Take 1 tablet (10 mg total) by mouth every 6 (six) hours as needed. For nausea 09/04/12  Yes Samul Dada, MD  Sodium Fluoride (PREVIDENT DT) Place onto teeth at bedtime.   Yes Historical Provider, MD  spironolactone (ALDACTONE) 25 MG tablet Take 25 mg by mouth every other day.    Yes Historical Provider, MD    Physical Exam: Filed Vitals:   03/23/13 7829 03/23/13 0613 03/23/13 0615 03/23/13 0630  BP:   136/70 149/75  Pulse: 105 101 101 106  Temp:      TempSrc:      Resp: 30 31 24 18   SpO2: 81% 96% 96% 93%    Physical Exam  Constitutional: Appears well-developed and well-nourished. No distress.  HENT: Normocephalic. External right and left ear normal. Oropharynx is clear and moist.  Eyes:  Conjunctivae and EOM are normal. PERRLA, no scleral icterus.  Neck: Normal ROM. Neck supple. No JVD. No tracheal deviation. No thyromegaly.  CVS: Regular rhythm, tachycardic, S1/S2 +, no murmurs, no gallops, no carotid bruit. Tender to palpation over the bilateral anterior chest area Pulmonary: Effort and breath sounds normal, no stridor, mild rhonchi at bases Abdominal: Soft. BS +,  no distension, tenderness, rebound or guarding.  Musculoskeletal: Normal range of motion. No edema and no tenderness.  Lymphadenopathy: No lymphadenopathy noted, cervical, inguinal. Neuro: Alert. Normal reflexes, muscle tone coordination. No cranial nerve deficit. Skin: Skin is warm and dry. No rash noted. Not diaphoretic. No erythema. No pallor.  Psychiatric: Normal mood and affect. Behavior, judgment, thought content normal.   Labs on Admission:  Basic Metabolic Panel:  Recent Labs Lab 03/20/13 0805 03/23/13 0240  NA 138 131*  K 4.1 3.8  CL  --  94*  CO2 24 26  GLUCOSE 182* 161*  BUN 9.9 11  CREATININE 0.7 0.59  CALCIUM 10.0 9.9   Liver Function Tests:  Recent Labs Lab 03/20/13 0805  AST 14  ALT 9  ALKPHOS 54  BILITOT 0.50  PROT 6.4  ALBUMIN 3.4*   CBC:  Recent Labs Lab 03/20/13 0805 03/23/13 0240  WBC 5.1 4.5  NEUTROABS 3.8  --   HGB 10.8* 11.1*  HCT 32.4* 32.6*  MCV 105.5* 104.2*  PLT 153 116*   CBG:  Recent Labs Lab 03/23/13 0231  GLUCAP 141*    Radiological Exams on Admission: Dg Chest 2 View  03/23/2013    Lungs hypoexpanded; left basilar airspace opacification raises concern for pneumonia.     Ct Chest W Contrast   03/23/2013   1. Acute minimally displaced fractures of the left posterior 10th and 11th ribs.  2. Subacute healing fractures of the left lateral 7th, 8th, and 9th ribs.  3. Bibasilar opacities, left greater than right. Atelectasis is favored, although superimposed infection is not excluded.  4. Cardiomegaly with diffuse 3 vessel coronary artery  calcifications.  5. Large hiatal hernia  6. Diffuse osteopenia.    EKG: Normal sinus rhythm, no ST/T wave changes  Debbora Presto, MD  [facility] Pager [phone]  If 7PM-7AM, please contact night-coverage [email or web address] Password [email or web address] 03/23/2013, 7:11 AM

## 2013-03-23 NOTE — ED Provider Notes (Signed)
CSN: [id number]     Arrival date & time 03/23/13  0205 History   First MD Initiated Contact with Patient 03/23/13 [phone]     Chief Complaint  Patient presents with  . Fall  . Left Side Pain    (Consider location/radiation/quality/duration/timing/severity/associated sxs/prior Treatment) HPI Patient walks with a cane and went to the bathroom this morning. States she lost her balance and fell backwards hitting her left side on the commode. She denies any head or neck injury. Complains of pain when she takes a deep breath in. She's had a mild cough for several days. She denies any abdominal pain. Denies any focal weakness or numbness. Past Medical History  Diagnosis Date  . Hypertension   . Diabetes mellitus   . Anemia   . Osteoporosis   . Dyslipidemia   . Myocardial infarction   . colon ca dx'd 2003    surg only  . Multiple myeloma dx'd 2009    oral chemo ongoing  . Coronary artery disease   . Shortness of breath   . GERD (gastroesophageal reflux disease)   . Wears glasses    Past Surgical History  Procedure Laterality Date  . Coronary artery bypass graft  08/2001    4 vessel  . Colon surgery  2003  . Tonsillectomy    . Appendectomy    . Abdominal hysterectomy    . Back surgery    . Eye surgery      cataracts  . Direct laryngoscopy with radiaesse injection Left 08/13/2012    Procedure: LEFT VOCAL CORD RADIESSE AUGMENTATION LARYNGOSCOPY ;  Surgeon: Flo Shanks, MD;  Location: [facility];  Service: ENT;  Laterality: Left;   Family History  Problem Relation Age of Onset  . Heart disease Brother   . Diabetes Brother    History  Substance Use Topics  . Smoking status: Never Smoker   . Smokeless tobacco: Never Used  . Alcohol Use: No   OB History   Grav Para Term Preterm Abortions TAB SAB Ect Mult Living                 Review of Systems  Constitutional: Negative for fever and chills.  Respiratory: Positive for cough and shortness of breath.    Cardiovascular: Positive for chest pain. Negative for palpitations and leg swelling.  Gastrointestinal: Negative for nausea, vomiting, abdominal pain and diarrhea.  Musculoskeletal: Positive for back pain.  Skin: Negative for rash and wound.  Neurological: Negative for dizziness, syncope, weakness, light-headedness, numbness and headaches.  All other systems reviewed and are negative.    Allergies  Gemfibrozil; Nifedipine; and Questran  Home Medications   Current Outpatient Rx  Name  Route  Sig  Dispense  Refill  . acetaminophen (TYLENOL) 650 MG CR tablet   Oral   Take 650 mg by mouth every 8 (eight) hours as needed for pain.         Marland Kitchen acyclovir (ZOVIRAX) 400 MG tablet   Oral   Take 1 tablet (400 mg total) by mouth 2 (two) times daily.   60 tablet   3   . Alum & Mag Hydroxide-Simeth (MAGIC MOUTHWASH) SOLN   Swish & Spit   Swish and spit 5 mLs 4 (four) times daily as needed (for mouth ulcers).          Marland Kitchen aspirin EC 81 MG tablet   Oral   Take 81 mg by mouth daily.         . carvedilol (  COREG) 25 MG tablet   Oral   Take 25 mg by mouth 2 (two) times daily with a meal.         . Cholecalciferol (VITAMIN D) 2000 UNITS tablet   Oral   Take 2,000 Units by mouth at bedtime.         . Cholecalciferol (VITAMIN D-3) 5000 UNITS TABS   Oral   Take 1 tablet by mouth at bedtime.         . cyanocobalamin (,VITAMIN B-12,) 1000 MCG/ML injection   Intramuscular   Inject 1,000 mcg into the muscle every 30 (thirty) days.         . cyclobenzaprine (FLEXERIL) 10 MG tablet   Oral   Take 1 tablet (10 mg total) by mouth 3 (three) times daily as needed. For muscle spasm   90 tablet   1   . darbepoetin (ARANESP) 300 MCG/0.6ML SOLN   Subcutaneous   Inject 300 mcg into the skin as needed (given if needed for anemia).         Marland Kitchen HYDROcodone-acetaminophen (NORCO) 10-325 MG per tablet   Oral   Take 1 tablet by mouth every 6 (six) hours as needed for pain.   30 tablet    0   . lisinopril (PRINIVIL,ZESTRIL) 2.5 MG tablet   Oral   Take 2.5 mg by mouth every other day.         . metFORMIN (GLUCOPHAGE) 500 MG tablet   Oral   Take 500 mg by mouth 2 (two) times daily with a meal.           . Multiple Vitamin (MULTIVITAMIN WITH MINERALS) TABS   Oral   Take 1 tablet by mouth daily.         . pantoprazole (PROTONIX) 40 MG tablet   Oral   Take 40 mg by mouth daily.           Bertram Gala Glycol-Propyl Glycol (SYSTANE OP)   Both Eyes   Place 2 drops into both eyes at bedtime.          Marland Kitchen PRESCRIPTION MEDICATION   Intravenous   Inject into the vein every 7 (seven) days. Velcade and Cytoxan infusion q7d on Wednesdays.         . prochlorperazine (COMPAZINE) 10 MG tablet   Oral   Take 1 tablet (10 mg total) by mouth every 6 (six) hours as needed. For nausea   30 tablet   3   . Sodium Fluoride (PREVIDENT DT)   dental   Place onto teeth at bedtime.         Marland Kitchen spironolactone (ALDACTONE) 25 MG tablet   Oral   Take 25 mg by mouth every other day.           BP 149/75  Pulse 106  Temp(Src) 98.7 F (37.1 C) (Oral)  Resp 18  SpO2 93% Physical Exam  Nursing note and vitals reviewed. Constitutional: She is oriented to person, place, and time. She appears well-developed and well-nourished. No distress.  HENT:  Head: Normocephalic and atraumatic.  Mouth/Throat: Oropharynx is clear and moist.  No obvious head injury  Eyes: EOM are normal. Pupils are equal, round, and reactive to light.  Neck: Normal range of motion. Neck supple.  No posterior midline cervical tenderness.  Cardiovascular: Normal rate and regular rhythm.   Pulmonary/Chest: No respiratory distress. She has no wheezes. She has no rales. She exhibits tenderness (tenderness to palpation over the left lower ribs).  Tachypnea  and splinting on the left side. Decreased breath sounds left base.  Abdominal: Soft. Bowel sounds are normal. She exhibits no distension and no mass. There  is no tenderness. There is no rebound and no guarding.  Musculoskeletal: Normal range of motion. She exhibits tenderness (tenderness to palpation diffuse thoracic spine.). She exhibits no edema.  Neurological: She is alert and oriented to person, place, and time.  Patient is alert and oriented x3 with clear, goal oriented speech. Patient has 5/5 motor in all extremities. Sensation is intact to light touch.  Skin: Skin is warm and dry. No rash noted. No erythema.  Psychiatric: She has a normal mood and affect. Her behavior is normal.    ED Course  Procedures (including critical care time) Labs Review Labs Reviewed  GLUCOSE, CAPILLARY - Abnormal; Notable for the following:    Glucose-Capillary 141 (*)    All other components within normal limits  CBC - Abnormal; Notable for the following:    RBC 3.13 (*)    Hemoglobin 11.1 (*)    HCT 32.6 (*)    MCV 104.2 (*)    MCH 35.5 (*)    Platelets 116 (*)    All other components within normal limits  BASIC METABOLIC PANEL - Abnormal; Notable for the following:    Sodium 131 (*)    Chloride 94 (*)    Glucose, Bld 161 (*)    GFR calc non Af Amer 81 (*)    All other components within normal limits   Imaging Review Dg Chest 2 View  03/23/2013   CLINICAL DATA:  Cough and congestion; status post fall. Shortness of breath.  EXAM: CHEST  2 VIEW  COMPARISON:  Chest radiograph performed 09/05/2012, and CT of the chest performed 07/25/2012  FINDINGS: The lungs are hypoexpanded. Left basilar airspace opacification raises concern for pneumonia. No definite pleural effusion or pneumothorax is seen.  Cardiomediastinal silhouette is borderline normal in size. The patient is status post median sternotomy. Calcification is noted within the aortic arch. No acute osseous abnormalities are seen. The patient is status post vertebroplasty at multiple levels.  IMPRESSION: Lungs hypoexpanded; left basilar airspace opacification raises concern for pneumonia.    Electronically Signed   By: Roanna Raider M.D.   On: 03/23/2013 03:46   Ct Chest W Contrast  03/23/2013   CLINICAL DATA:  Fall, with left-sided chest pain  EXAM: CT CHEST WITH CONTRAST  TECHNIQUE: Multidetector CT imaging of the chest was performed during intravenous contrast administration.  CONTRAST:  80mL OMNIPAQUE IOHEXOL 300 MG/ML  SOLN  COMPARISON:  Radiograph performed earlier on the same day as well as prior CT from 07/25/2012  FINDINGS: No pathologically enlarged mediastinal, hilar, or axillary lymph nodes are identified.  Median sternotomy wires are in place. Sequelae of prior CABG are noted. There is marked to cardiomegaly with diffuse 3 vessel coronary artery calcifications. Scattered calcified atheromatous disease is also seen within the aortic arch and proximal great vessels. No mediastinal hematoma. No pericardial effusion.  There is no pneumothorax. Bibasilar opacities most likely reflect atelectasis, although possible infection is not entirely excluded. There is no pleural effusion or hemothorax.  Visualized upper abdomen is unremarkable. Large hiatal hernia is present.  Subacute/ healing fractures of the left 7th, 8th, and 9th ribs are present (series 2, image 40). There are acute minimally displaced fractures of the left posterior 10th and 11th ribs. No right-sided rib fracture. The scapulae are intact. Clavicles are intact. Multilevel compression deformities within the visualized spine  are unchanged. Sequelae of prior vertebral augmentation are again noted, unchanged.  IMPRESSION: 1. Acute minimally displaced fractures of the left posterior 10th and 11th ribs. 2. Subacute healing fractures of the left lateral 7th, 8th, and 9th ribs. 3. Bibasilar opacities, left greater than right. Atelectasis is favored, although superimposed infection is not excluded. 4. Cardiomegaly with diffuse 3 vessel coronary artery calcifications. 5. Large hiatal hernia 6. Diffuse osteopenia.   Electronically Signed    By: Rise Mu M.D.   On: 03/23/2013 05:27    EKG Interpretation   None       MDM  Patient treated with IV narcotics. When taken off oxygen patient's sats dropped into the low 80s. Patient will need to be admitted for persistent hypoxia likely due to atelectasis.  Discussed with Dr. Lovell Sheehan will admit to a telemetry bed for observation.  Loren Racer, MD 03/23/13 [phone]

## 2013-03-23 NOTE — ED Notes (Signed)
I have just spent an extended time with pt. Instructing her to by all means occasionally throughout her days, to take deep breaths and hold her breath for a few moments to avoid pneumonia.  This was in the presence of her daughter, who is in constant attendance.  Pt. And daughter verbalized understanding of this and the importance of doing this activity.  She is in no distress; and an occasional congested cough is noted.  She is alsert and oriented x 4 and tells me the pain medicine is "still working--I feel better".

## 2013-03-24 DIAGNOSIS — D696 Thrombocytopenia, unspecified: Secondary | ICD-10-CM | POA: Diagnosis present

## 2013-03-24 DIAGNOSIS — W19XXXS Unspecified fall, sequela: Secondary | ICD-10-CM

## 2013-03-24 DIAGNOSIS — E440 Moderate protein-calorie malnutrition: Secondary | ICD-10-CM | POA: Diagnosis present

## 2013-03-24 DIAGNOSIS — I2510 Atherosclerotic heart disease of native coronary artery without angina pectoris: Secondary | ICD-10-CM

## 2013-03-24 DIAGNOSIS — E871 Hypo-osmolality and hyponatremia: Secondary | ICD-10-CM | POA: Diagnosis present

## 2013-03-24 LAB — CBC
HCT: 31 % — ABNORMAL LOW (ref 36.0–46.0)
Hemoglobin: 11 g/dL — ABNORMAL LOW (ref 12.0–15.0)
MCH: 35 pg — ABNORMAL HIGH (ref 26.0–34.0)
RDW: 13.9 % (ref 11.5–15.5)
WBC: 4.5 10*3/uL (ref 4.0–10.5)

## 2013-03-24 LAB — GLUCOSE, CAPILLARY
Glucose-Capillary: 118 mg/dL — ABNORMAL HIGH (ref 70–99)
Glucose-Capillary: 125 mg/dL — ABNORMAL HIGH (ref 70–99)
Glucose-Capillary: 199 mg/dL — ABNORMAL HIGH (ref 70–99)

## 2013-03-24 LAB — BASIC METABOLIC PANEL
Chloride: 91 mEq/L — ABNORMAL LOW (ref 96–112)
Creatinine, Ser: 0.6 mg/dL (ref 0.50–1.10)
GFR calc Af Amer: >90 mL/min (ref >90)
Glucose, Bld: 141 mg/dL — ABNORMAL HIGH (ref 70–99)
Potassium: 3.8 mEq/L (ref 3.5–5.1)
Sodium: 126 mEq/L — ABNORMAL LOW (ref 135–145)

## 2013-03-24 LAB — MAGNESIUM: Magnesium: 2 mg/dL (ref 1.5–2.5)

## 2013-03-24 LAB — LEGIONELLA ANTIGEN, URINE: Legionella Antigen, Urine: NEGATIVE

## 2013-03-24 MED ORDER — VITAMINS A & D EX OINT
TOPICAL_OINTMENT | CUTANEOUS | Status: AC
  Administered 2013-03-24: 5
  Filled 2013-03-24: qty 5

## 2013-03-24 NOTE — Evaluation (Signed)
Physical Therapy Evaluation Patient Details Name: Christina Hopkins MRN: [id number] DOB: 08-11-1925 Today's Date: 03/24/2013 Time: 4098-1191 PT Time Calculation (min): 30 min  PT Assessment / Plan / Recommendation History of Present Illness    Pt is 79 yo female with HTN, DM, who presented to [facility] ED with main concern of bilateral chest area discomfort after an episode of fall she has sustained at home earlier this AM prior to this admission. She explains she got up this AM and went to bathroom, tried to reach her gown and she fell. She currently reports pain is persistent and sharp, 5/10 in severity and non radiating, worse with movement and deep breath, no specific alleviating factors.    Clinical Impression  Pt s/p fall at home with multiple rib fxs L side presents with functional mobility severely limited by c/o pain with movement including back pain, L chest wall pain and R foot pain.  Pt currently requires +2 assist to perform mobility tasks safely and with the least amount of discomfort.  Pt would benefit from follow up rehab at SNF level prior to return home.    PT Assessment  Patient needs continued PT services    Follow Up Recommendations  SNF    Does the patient have the potential to tolerate intense rehabilitation      Barriers to Discharge Decreased caregiver support Pt states dtr works during the day    Equipment Recommendations  None recommended by PT    Recommendations for Other Services OT consult   Frequency Min 3X/week    Precautions / Restrictions Precautions Precautions: Fall;Other (comment) Precaution Comments: L side rib fractures Restrictions Weight Bearing Restrictions: No   Pertinent Vitals/Pain 9/10; RN aware      Mobility  Bed Mobility Bed Mobility: Supine to Sit;Sit to Supine Supine to Sit: 3: Mod assist;HOB elevated Sit to Supine: 1: +2 Total assist;HOB flat Sit to Supine: Patient Percentage: 20% Details for Bed Mobility Assistance: Assist with  pad to draw to side of bed; assist to bring trunk to upright position.  Assist of 2 to transfer to bed and position for comfort 2* back, rib, and R foot pain Transfers Transfers: Not assessed (Tol~8 min at EOB but unable to stand 2* pain) Ambulation/Gait Ambulation/Gait Assistance: Not tested (comment)    Exercises     PT Diagnosis: Difficulty walking  PT Problem List: Decreased strength;Decreased range of motion;Decreased activity tolerance;Decreased mobility;Decreased knowledge of use of DME;Pain PT Treatment Interventions: DME instruction;Gait training;Functional mobility training;Therapeutic activities;Therapeutic exercise;Patient/family education     PT Goals(Current goals can be found in the care plan section) Acute Rehab PT Goals Patient Stated Goal: LEss pain PT Goal Formulation: With patient Time For Goal Achievement: 04/07/13 Potential to Achieve Goals: Fair  Visit Information  Last PT Received On: 03/24/13 Assistance Needed: +2       Prior Functioning  Home Living Family/patient expects to be discharged to:: Unsure Living Arrangements: Children Additional Comments: Pt states lives with daughter who works during the day Prior Function Level of Independence: Independent with assistive device(s) Comments: cane or RW Communication Communication: No difficulties    Cognition  Cognition Arousal/Alertness: Awake/alert Behavior During Therapy: WFL for tasks assessed/performed Overall Cognitive Status: Within Functional Limits for tasks assessed    Extremity/Trunk Assessment Upper Extremity Assessment Upper Extremity Assessment: RUE deficits/detail;LUE deficits/detail RUE Deficits / Details: ltd shoulder ROM - AAROM to 80 shoulder elevation LUE Deficits / Details: Ltd shoulder ROM - AAROM to 70 elevation - pain limited Lower Extremity  Assessment Lower Extremity Assessment: Overall WFL for tasks assessed   Balance    End of Session PT - End of Session Equipment  Utilized During Treatment: Oxygen Activity Tolerance: Patient limited by pain Patient left: in bed;with call bell/phone within reach;with nursing/sitter in room Nurse Communication: Mobility status  GP     Nussen Pullin 03/24/2013, 5:04 PM

## 2013-03-24 NOTE — Progress Notes (Addendum)
[facility] PROGRESS NOTE  Christina Hopkins ZOX:[id number] DOB: 21-Jun-1925 DOA: 03/23/2013 PCP: Johny Blamer, MD  Brief narrative: 79 year old female with past medical history of HTN, DM, MM on chemotherapy, who presented to [facility] ED 03/23/2013 with main concern of bilateral chest area discomfort after the fall at home on the day of this admission. Patient fell in the bathroom as she was reaching for the gown. There was no prodromal symptoms prior to the fall. Patient reported chest/rib area pain bilaterally and worse with breathing or movement. Her pain is better controlled so far once given analgesia. In ED, pt found to be hemodynamically stable with initial BP 94/50 but this has improved to 150/73 with IV fluids given in ED. Oxygen saturation was 94% on 2 L nasal canula. Further studies included  CT chest which revealed left sided rib fracture noted and worrisome findings for PNA.   Assessment and Plan:   Principal Problem:  Fall, mechanical  - likely mechanical with subsequent ribs' fracture  - continue to provide supportive care with analgesia PRN - PT evaluation once pt is able to participate; order in place  Active Problems:  Magnesium deficiency  - severe, given 4 gm of Mg via IV 03/23/2013 - follow up magnesium level this am Fracture of multiple ribs status post mechanical fall - supportive care, analgesia as needed  - ortho consultation for recommendations  HCAP - given MM and active chemotherapy, will treat this as HCAP  - continue vancomycin and cefepime - follow up legionella result - strep pneumoniae is negative Multiple myeloma  - will notify Dr. Rosie Fate of pt's admission in am Anemia of chronic disease  - likely combination of anemia of chronic disease due to MM and sequela of chemotherapy as well as iron deficiency anemia - Hg and Hct appear to be stable and at baseline  - hemoglobin 11 this am - no indications for transfusion Thrombocytopenia - secondary to  multiple myeloma, sequela of chemotherapy and possibly Lovenox - d/c Lovenox and use SCD for DVT prophylaxis Hyponatremia - secondary to dehydration, pre-renal etiology - continue to monitor sodium level; has received IV fluids on admission but now off of IV fluids due to risk of fluid overload; may need additional bolus if sodium does not improve Diabetes mellitus  - A1c at target range on this admission - continue metformin and sliding scale insulin - CBG's in past 24 hours: 135, 184 and 123 Coronary artery disease  - continue Aspirin and Coreg  Hypertension  - reasonable BP on admission  - Continue Lisinopril, Coreg, Spironolactone  Moderate protein calorie malnutrition - nutrition consulted  Oxygen desaturation on ambulation - O2 dropped to 87% on ambulation - order placed for home oxygen  Code Status: Full  Family Communication: Pt and daughter at bedside  Disposition Plan: remains inpatient; follow up PT evaluation   Consultants:  Notify Dr. Rosie Fate in am of patient's admission; he was added as a Research scientist (medical) in [employer]  Procedures/Studies: Dg Chest 2 View 03/23/2013    IMPRESSION: Lungs hypoexpanded; left basilar airspace opacification raises concern for pneumonia.      Ct Chest W Contrast 03/23/2013    IMPRESSION: 1. Acute minimally displaced fractures of the left posterior 10th and 11th ribs. 2. Subacute healing fractures of the left lateral 7th, 8th, and 9th ribs. 3. Bibasilar opacities, left greater than right. Atelectasis is favored, although superimposed infection is not excluded. 4. Cardiomegaly with diffuse 3 vessel coronary artery calcifications. 5. Large hiatal hernia 6.  Diffuse osteopenia.     Antibiotics:  Cefepime 03/23/2013 -->  Vancomycin 03/23/2013 -->  Acyclovir 03/23/2013 --   HPI/Subjective: No events overnight.   Objective: Filed Vitals:   03/23/13 0926 03/23/13 1500 03/23/13 2115 03/24/13 0430  BP: 150/73 94/50 114/52 124/58  Pulse: 101 100 88 75   Temp: 98 F (36.7 C) 97.7 F (36.5 C) 98.3 F (36.8 C) 97.8 F (36.6 C)  TempSrc: Oral Oral Oral Axillary  Resp: 20 22 20 20   Height: 4\' 9"  (1.448 m)     Weight: 46.2 kg (101 lb 13.6 oz)     SpO2: 96% 100% 99% 94%    Intake/Output Summary (Last 24 hours) at 03/24/13 0723 Last data filed at 03/24/13 0500  Gross per 24 hour  Intake      0 ml  Output    200 ml  Net   -200 ml    Exam:   General:  Pt is  not in acute distress  Cardiovascular: Regular rate and rhythm, S1/S2 appreciated  Respiratory: somewhat diminished breath sounds bilaterallyy, no crackles  Abdomen: Soft, non tender, non distended, bowel sounds present  Extremities: Pulses DP and PT palpable bilaterally  Neuro: Grossly nonfocal  Data Reviewed: Basic Metabolic Panel:  Recent Labs Lab 03/20/13 0805 03/23/13 0240 03/23/13 1036 03/24/13 0506  NA 138 131*  --  126*  K 4.1 3.8  --  3.8  CL  --  94*  --  91*  CO2 24 26  --  22  GLUCOSE 182* 161*  --  141*  BUN 9.9 11  --  13  CREATININE 0.7 0.59  --  0.60  CALCIUM 10.0 9.9  --  9.5  MG  --   --  0.9*  --   PHOS  --   --  3.6  --    Liver Function Tests:  Recent Labs Lab 03/20/13 0805  AST 14  ALT 9  ALKPHOS 54  BILITOT 0.50  PROT 6.4  ALBUMIN 3.4*   No results found for this basename: LIPASE, AMYLASE,  in the last 168 hours No results found for this basename: AMMONIA,  in the last 168 hours CBC:  Recent Labs Lab 03/20/13 0805 03/23/13 0240 03/24/13 0506  WBC 5.1 4.5 4.5  NEUTROABS 3.8  --   --   HGB 10.8* 11.1* 11.0*  HCT 32.4* 32.6* 31.0*  MCV 105.5* 104.2* 98.7  PLT 153 116* 116*   Cardiac Enzymes: No results found for this basename: CKTOTAL, CKMB, CKMBINDEX, TROPONINI,  in the last 168 hours BNP: No components found with this basename: POCBNP,  CBG:  Recent Labs Lab 03/23/13 0231 03/23/13 1132 03/23/13 1611 03/23/13 2103  GLUCAP 141* 135* 184* 123*    No results found for this or any previous visit (from the  past 240 hour(s)).   Scheduled Meds: . acyclovir  400 mg Oral BID  . aspirin EC  81 mg Oral Daily  . carvedilol  25 mg Oral BID WC  . ceFEPime (MAXIPIME) IV  1 g Intravenous Q24H  . insulin aspart  0-9 Units Subcutaneous TID WC  . lisinopril  2.5 mg Oral QODAY  . metFORMIN  500 mg Oral BID WC  . pantoprazole  40 mg Oral Daily  . spironolactone  25 mg Oral QODAY  . vancomycin  750 mg Intravenous Q24H     Debbora Presto, MD  [facility] Pager [phone]  If 7PM-7AM, please contact night-coverage [email or web address] Password [email or web address] 03/24/2013, 7:23 AM   LOS:  1 day

## 2013-03-24 NOTE — Progress Notes (Signed)
OT Cancellation Note  Patient Details Name: SHERREY NORTH MRN: [id number] DOB: 1925/12/17   Cancelled Treatment:    Reason Eval/Treat Not Completed: Other (comment)  OT order for splint. Please order OT eval/treat if appropriate. Thank you.  [facility] Jaxiel Kines, OTR/L  [phone] 03/24/2013 03/24/2013, 2:48 PM

## 2013-03-25 DIAGNOSIS — E119 Type 2 diabetes mellitus without complications: Secondary | ICD-10-CM

## 2013-03-25 DIAGNOSIS — J189 Pneumonia, unspecified organism: Secondary | ICD-10-CM

## 2013-03-25 DIAGNOSIS — I10 Essential (primary) hypertension: Secondary | ICD-10-CM

## 2013-03-25 DIAGNOSIS — C9000 Multiple myeloma not having achieved remission: Secondary | ICD-10-CM

## 2013-03-25 DIAGNOSIS — E871 Hypo-osmolality and hyponatremia: Secondary | ICD-10-CM

## 2013-03-25 DIAGNOSIS — R0902 Hypoxemia: Secondary | ICD-10-CM

## 2013-03-25 LAB — COMPREHENSIVE METABOLIC PANEL
ALT: 7 U/L (ref 0–35)
BUN: 8 mg/dL (ref 6–23)
CO2: 32 mEq/L (ref 19–32)
Chloride: 93 mEq/L — ABNORMAL LOW (ref 96–112)
Creatinine, Ser: 0.62 mg/dL (ref 0.50–1.10)
GFR calc non Af Amer: 80 mL/min — ABNORMAL LOW (ref >90)
Potassium: 3.7 mEq/L (ref 3.5–5.1)
Sodium: 130 mEq/L — ABNORMAL LOW (ref 135–145)
Total Bilirubin: 0.7 mg/dL (ref 0.3–1.2)
Total Protein: 6.1 g/dL (ref 6.0–8.3)

## 2013-03-25 LAB — CBC WITH DIFFERENTIAL/PLATELET
HCT: 29.8 % — ABNORMAL LOW (ref 36.0–46.0)
Hemoglobin: 10.5 g/dL — ABNORMAL LOW (ref 12.0–15.0)
Lymphocytes Relative: 20 % (ref 12–46)
MCHC: 35.2 g/dL (ref 30.0–36.0)
Monocytes Absolute: 0.6 10*3/uL (ref 0.1–1.0)
Monocytes Relative: 14 % — ABNORMAL HIGH (ref 3–12)
Neutro Abs: 3 10*3/uL (ref 1.7–7.7)
WBC: 4.6 10*3/uL (ref 4.0–10.5)

## 2013-03-25 LAB — GLUCOSE, CAPILLARY
Glucose-Capillary: 132 mg/dL — ABNORMAL HIGH (ref 70–99)
Glucose-Capillary: 171 mg/dL — ABNORMAL HIGH (ref 70–99)
Glucose-Capillary: 129 mg/dL — ABNORMAL HIGH (ref 70–99)

## 2013-03-25 LAB — MAGNESIUM: Magnesium: 1.4 mg/dL — ABNORMAL LOW (ref 1.5–2.5)

## 2013-03-25 MED ORDER — IPRATROPIUM BROMIDE 0.02 % IN SOLN
0.5000 mg | Freq: Four times a day (QID) | RESPIRATORY_TRACT | Status: DC
  Administered 2013-03-26: 0.5 mg via RESPIRATORY_TRACT
  Filled 2013-03-25: qty 2.5

## 2013-03-25 MED ORDER — ENSURE PUDDING PO PUDG
1.0000 | ORAL | Status: DC
  Administered 2013-03-25 – 2013-03-27 (×3): 1 via ORAL
  Filled 2013-03-25 (×4): qty 1

## 2013-03-25 MED ORDER — ADULT MULTIVITAMIN W/MINERALS CH
1.0000 | ORAL_TABLET | Freq: Every day | ORAL | Status: DC
  Administered 2013-03-26 – 2013-03-27 (×2): 1 via ORAL
  Filled 2013-03-25 (×4): qty 1

## 2013-03-25 MED ORDER — ALBUTEROL SULFATE (5 MG/ML) 0.5% IN NEBU
2.5000 mg | INHALATION_SOLUTION | Freq: Four times a day (QID) | RESPIRATORY_TRACT | Status: DC
  Administered 2013-03-26: 07:00:00 2.5 mg via RESPIRATORY_TRACT
  Filled 2013-03-25: qty 0.5

## 2013-03-25 MED ORDER — GUAIFENESIN ER 600 MG PO TB12
600.0000 mg | ORAL_TABLET | Freq: Two times a day (BID) | ORAL | Status: DC
  Administered 2013-03-26 – 2013-03-28 (×5): 600 mg via ORAL
  Filled 2013-03-25 (×7): qty 1

## 2013-03-25 NOTE — Progress Notes (Signed)
[facility] PROGRESS NOTE  Christina Hopkins ZOX:[id number] DOB: 10-01-1925 DOA: 03/23/2013 PCP: Johny Blamer, MD  Assessment/Plan: Fall, mechanical  - likely mechanical with subsequent ribs' fracture  - continue to provide supportive care with analgesia PRN  - PT evaluation once pt is able to participate; order in place    Magnesium deficiency  - severe, given 4 gm of Mg via IV 03/23/2013  - follow up magnesium level this am   Fracture of multiple ribs status post mechanical fall  - supportive care, analgesia as needed  - ortho consultation for recommendations   HCAP  - given MM and active chemotherapy, will treat this as HCAP  - continue vancomycin and cefepime  -Start DuoNeb 4 times a day -Mucinex twice a day - follow up legionella negative   - strep pneumoniae is negative   Multiple myeloma  - will notify Dr. Rosie Fate of pt's admission in am   Anemia of chronic disease  - likely combination of anemia of chronic disease due to multiple myeloma and sequela of chemotherapy as well as iron deficiency anemia  - Hg and Hct appear to be stable and at baseline  - hemoglobin 10.5 this am  - no indications for transfusion   Thrombocytopenia  - secondary to multiple myeloma, sequela of chemotherapy and possibly Lovenox  - d/c Lovenox and use SCD for DVT prophylaxis   Hyponatremia (asymptomatic) - secondary to dehydration, pre-renal etiology; improving continue to monitor  -    Diabetes mellitus  - A1c at target range on this admission  - DC Metformin and controlled with sliding scale insulin     Coronary artery disease  - continue Aspirin and Coreg   Hypertension  - reasonable BP on admission  - Continue Lisinopril, Coreg, Spironolactone   Moderate protein calorie malnutrition  - nutrition consulted   Oxygen desaturation on ambulation  - SpO2 = 93% on RA   - Continue O2 when necessary to maintain SpO2 > 93%  -During waking hours incentive spirometry    Code  Status: Full Family Communication:  Disposition Plan:   Consultants:  Notify Dr. Rosie Fate in am of patient's admission; he was added as a Research scientist (medical) in [employer]; per note from 03/20/2013 the following is her current chemotherapy; 1. Velcade, Cytoxan and Decadron every 2 weeks from 08/29/2012.  2. Vitamin B12 mg IM monthly.  3. Aranesp 300 mcg subcu every 2 weeks for hemoglobin less than or equal to 10.      Procedures/Studies:  Dg Chest 2 View 03/23/2013 IMPRESSION: Lungs hypoexpanded; left basilar airspace opacification raises concern for pneumonia.  Ct Chest W Contrast 03/23/2013 IMPRESSION: 1. Acute minimally displaced fractures of the left posterior 10th and 11th ribs. 2. Subacute healing fractures of the left lateral 7th, 8th, and 9th ribs. 3. Bibasilar opacities, left greater than right. Atelectasis is favored, although superimposed infection is not excluded. 4. Cardiomegaly with diffuse 3 vessel coronary artery calcifications. 5. Large hiatal hernia 6. Diffuse osteopenia.    Antibiotics:  Cefepime 03/23/2013 -->  Vancomycin 03/23/2013 -->  Acyclovir 03/23/2013 --    HPI/Subjective: 79 year old female with past medical history of HTN, DM, multiple myeloma on chemotherapy, who presented to [facility] ED 03/23/2013 with main concern of bilateral chest area discomfort after the fall at home on the day of this admission. Patient fell in the bathroom as she was reaching for the gown. There was no prodromal symptoms prior to the fall. Patient reported chest/rib area pain bilaterally and worse with breathing  or movement. Her pain is better controlled so far once given analgesia.  In ED, pt found to be hemodynamically stable with initial BP 94/50 but this has improved to 150/73 with IV fluids given in ED. Oxygen saturation was 94% on 2 L nasal canula. Further studies included CT chest which revealed left sided rib fracture noted and worrisome findings for PNA. TODAY states (+) Lt lateral rib cage pain w/  inspiration. Otherwise no complaints     Objective: Filed Vitals:   03/24/13 0430 03/24/13 1443 03/24/13 2134 03/25/13 0445  BP: 124/58 102/46 138/63 144/73  Pulse: 75 88 88 83  Temp: 97.8 F (36.6 C) 98.4 F (36.9 C) 98.1 F (36.7 C) 98 F (36.7 C)  TempSrc: Axillary Axillary Oral Oral  Resp: 20 18 20 20   Height:      Weight:      SpO2: 94% 97% 97% 96%    Intake/Output Summary (Last 24 hours) at 03/25/13 0836 Last data filed at 03/25/13 0700  Gross per 24 hour  Intake      0 ml  Output   1850 ml  Net  -1850 ml   Filed Weights   03/23/13 0926  Weight: 46.2 kg (101 lb 13.6 oz)    Exam:   General:  A./O. x4, NAD except when palpated along left lateral rib cage   Cardiovascular: Regular rhythm and rate, negative murmurs rubs gallops  Respiratory: Decreased air movement left lower lung fields, remainder of lungs coarse but clear to auscultation  Abdomen: Soft, nontender, plus bowel sounds  Musculoskeletal: Positive pain to palpation along left lateral aspect of rib cage     Data Reviewed: Basic Metabolic Panel:  Recent Labs Lab 03/20/13 0805 03/23/13 0240 03/23/13 1036 03/24/13 0506  NA 138 131*  --  126*  K 4.1 3.8  --  3.8  CL  --  94*  --  91*  CO2 24 26  --  22  GLUCOSE 182* 161*  --  141*  BUN 9.9 11  --  13  CREATININE 0.7 0.59  --  0.60  CALCIUM 10.0 9.9  --  9.5  MG  --   --  0.9* 2.0  PHOS  --   --  3.6  --    Liver Function Tests:  Recent Labs Lab 03/20/13 0805  AST 14  ALT 9  ALKPHOS 54  BILITOT 0.50  PROT 6.4  ALBUMIN 3.4*   No results found for this basename: LIPASE, AMYLASE,  in the last 168 hours No results found for this basename: AMMONIA,  in the last 168 hours CBC:  Recent Labs Lab 03/20/13 0805 03/23/13 0240 03/24/13 0506  WBC 5.1 4.5 4.5  NEUTROABS 3.8  --   --   HGB 10.8* 11.1* 11.0*  HCT 32.4* 32.6* 31.0*  MCV 105.5* 104.2* 98.7  PLT 153 116* 116*   Cardiac Enzymes: No results found for this basename:  CKTOTAL, CKMB, CKMBINDEX, TROPONINI,  in the last 168 hours BNP (last 3 results)  Recent Labs  03/23/13 1036  PROBNP 2180.0*   CBG:  Recent Labs Lab 03/24/13 0753 03/24/13 1216 03/24/13 1709 03/24/13 2129 03/25/13 0742  GLUCAP 118* 125* 199* 136* 133*    Recent Results (from the past 240 hour(s))  CULTURE, BLOOD (ROUTINE X 2)     Status: None   Collection Time    03/23/13 10:36 AM      Result Value Range Status   Specimen Description BLOOD RIGHT ARM   Final  Special Requests     Final   Value: BOTTLES DRAWN AEROBIC AND ANAEROBIC 4CC BOTH BOTTLES   Culture  Setup Time     Final   Value: 03/23/2013 14:27     Performed at [facility]   Culture     Final   Value:        BLOOD CULTURE RECEIVED NO GROWTH TO DATE CULTURE WILL BE HELD FOR 5 DAYS BEFORE ISSUING A FINAL NEGATIVE REPORT     Performed at [facility]   Report Status PENDING   Incomplete  CULTURE, BLOOD (ROUTINE X 2)     Status: None   Collection Time    03/23/13 10:45 AM      Result Value Range Status   Specimen Description BLOOD RIGHT HAND   Final   Special Requests     Final   Value: BOTTLES DRAWN AEROBIC AND ANAEROBIC 3CC BOTH BOTTLES   Culture  Setup Time     Final   Value: 03/23/2013 14:26     Performed at [facility]   Culture     Final   Value:        BLOOD CULTURE RECEIVED NO GROWTH TO DATE CULTURE WILL BE HELD FOR 5 DAYS BEFORE ISSUING A FINAL NEGATIVE REPORT     Performed at [facility]   Report Status PENDING   Incomplete     Studies: No results found.  Scheduled Meds: . acyclovir  400 mg Oral BID  . aspirin EC  81 mg Oral Daily  . carvedilol  25 mg Oral BID WC  . ceFEPime (MAXIPIME) IV  1 g Intravenous Q24H  . insulin aspart  0-9 Units Subcutaneous TID WC  . lisinopril  2.5 mg Oral QODAY  . metFORMIN  500 mg Oral BID WC  . pantoprazole  40 mg Oral Daily  . sodium chloride  3 mL Intravenous Q12H  . spironolactone  25 mg Oral QODAY  . vancomycin  750  mg Intravenous Q24H   Continuous Infusions:   Principal Problem:   Fall Active Problems:   Multiple myeloma   Anemia, iron deficiency   Magnesium deficiency   Diabetes mellitus   Hypertension   Coronary artery disease   Fracture of multiple ribs   PNA (pneumonia)   Thrombocytopenia, unspecified   Hyponatremia   Moderate protein-calorie malnutrition    Time spent: 30 min    Lashaye Fisk, J  [facility] Pager [phone]. If 7PM-7AM, please contact night-coverage at [email or web address], password [facility] 03/25/2013, 8:36 AM  LOS: 2 days

## 2013-03-25 NOTE — Evaluation (Addendum)
Clinical/Bedside Swallow Evaluation Patient Details  Name: JAMIN HUMPHRIES MRN: [id number] Date of Birth: [birth year]  Today's Date: 03/25/2013 Time: 1420-1500 SLP Time Calculation (min): 40 min  Past Medical History:  Past Medical History  Diagnosis Date  . Hypertension   . Diabetes mellitus   . Anemia   . Osteoporosis   . Dyslipidemia   . Myocardial infarction   . colon ca dx'd 2003    surg only  . Multiple myeloma dx'd 2009    oral chemo ongoing  . Coronary artery disease   . Shortness of breath   . GERD (gastroesophageal reflux disease)   . Wears glasses    Past Surgical History:  Past Surgical History  Procedure Laterality Date  . Coronary artery bypass graft  08/2001    4 vessel  . Colon surgery  2003  . Tonsillectomy    . Appendectomy    . Abdominal hysterectomy    . Back surgery    . Eye surgery      cataracts  . Direct laryngoscopy with radiaesse injection Left 08/13/2012    Procedure: LEFT VOCAL CORD RADIESSE AUGMENTATION LARYNGOSCOPY ;  Surgeon: Flo Shanks, MD;  Location: [facility];  Service: ENT;  Laterality: Left;   HPI:  76 yoF with h/o multiple myeloma currently on chemotherapy presented 11/8 s/p fall and reported diarrhea since last chemo 11/5. CT chest and CXR with bibasilar opacities - concerning for PNA. CT chest also showed acute minimally displaced fractures of the L posterior 10th and 11th ribs along with subacute healing fractures of the L lateral 7-9th ribs.  Pt has h/o SEVERE dysphagia diagnosed in January 2014 and repeat swallow evaluation was ordered.  Pt declines to use thickened liquids and states she has been maintaining her weight since January.  She has been to see neurology and she reports she needed to see ENT.  Pt further reports she saw ENT and is s/p laryngeal injection to aid medialization and now has phonation ability.  RN reported pt coughing with intake today but pt declines to consume thickened liquids per RN.      Assessment / Plan / Recommendation Clinical Impression  Pt known to this SLP from previous MBS in January 2014.  History of moderately severe oropharyngeal dysphagia with sensorimotor deficits apparent.  Pt was seen by neuro and ENT after MBS in January and reports she is s/p injection in vocal folds to improve phonatory ability/airway protection.    Pt has reports she has maintained her nutrition and has not had a pulmonary infection until now.   She further reports she has not used thickener and does not desire to use it again (recommended to consume  Liquids until see MD after MBS in January 2014 for better pulmonary clearance).    Sensory deficits noted during January MBS with pt not sensing gross stasis in pharynx.    SLP does not recommend repeat MBS as pt verbalizes she does not desire diet modification and has been managing her dysphagia as best able.  Rec continue diet with precautions to mitigate aspiration.    Note pt was diagnosed with tardive dyskinesia in March 2014 and Remeron was dc'd- appears to continue with excessive oral lingual movement - pt denies improvement since Remeron dc'd.    Educated pt to items better tolerated when/if aspirated and SLP suspicion of ongoing aspiration prior to admission and current.  Suspect improvement in airway protection from laryngeal injection.  Question source of round solid submental protrusion.  RN informed.  Pt was not aware if present previous to this admission.    Further educated pt to increased ramification of aspiration with this medical event and deconditioning.   Teach back used to assure comprehension of information.  SLP to follow up x1 to assure all education is completed and pt is comfortable with current plan.    Aspiration Risk  Moderate    Diet Recommendation Regular;Thin liquid   Liquid Administration via: Cup Medication Administration: Crushed with puree (or whole - follow with water) Supervision: Patient able to self  feed Compensations: Slow rate;Small sips/bites Postural Changes and/or Swallow Maneuvers: Seated upright 90 degrees;Upright 30-60 min after meal (hock up to clear after meals)    Other  Recommendations Oral Care Recommendations: Oral care BID   Follow Up Recommendations  TBD   Frequency and Duration min 1 x/week  1 week   Pertinent Vitals/Pain Afebrile, decreased    SLP Swallow Goals   n/a  Swallow Study Prior Functional Status   known h/o dysphagia    General Date of Onset: 03/25/13 HPI: 7 yoF with h/o multiple myeloma currently on chemotherapy presented 11/8 s/p fall and reported diarrhea since last chemo 11/5. CT chest and CXR with bibasilar opacities - concerning for PNA. CT chest also showed acute minimally displaced fractures of the L posterior 10th and 11th ribs along with subacute healing fractures of the L lateral 7-9th ribs.  Pt has h/o SEVERE dysphagia diagnosed in January 2014 and repeat swallow evaluation was ordered.  Pt declines to use thickened liquids and states she has been maintaining her weight since January.  She has been to see neurology and she reports she needed to see ENT.  Pt further reports she saw ENT and is s/p laryngeal injection to aid medialization and now has phonation ability.  RN reported pt coughing with intake today but pt declines to consume thickened liquids per RN.   Type of Study: Bedside swallow evaluation Previous Swallow Assessment: MBS January 2014 moderately severe oropharyngeal dysphagia with overt aspiration of liquids and severe stasis without sensation, recommended follow up with md to determine source of dysphagia, liquids (nectar and thin) with precautions  Diet Prior to this Study: Regular;Thin liquids Temperature Spikes Noted: No Respiratory Status: Room air History of Recent Intubation: No Behavior/Cognition: Alert;Cooperative;Pleasant mood Oral Cavity - Dentition: Adequate natural dentition Self-Feeding Abilities: Able to feed  self Patient Positioning: Upright in bed Baseline Vocal Quality: Clear Volitional Cough: Strong Volitional Swallow: Able to elicit    Oral/Motor/Sensory Function Overall Oral Motor/Sensory Function: Appears within functional limits for tasks assessed   Ice Chips Ice chips: Not tested   Thin Liquid Thin Liquid: Impaired Oral Phase Impairments: Reduced lingual movement/coordination Oral Phase Functional Implications: Prolonged oral transit Pharyngeal  Phase Impairments: Multiple swallows    Nectar Thick Nectar Thick Liquid: Not tested   Honey Thick Honey Thick Liquid: Not tested   Puree Puree: Impaired Presentation: Self Fed;Spoon Oral Phase Impairments: Reduced lingual movement/coordination;Impaired anterior to posterior transit Oral Phase Functional Implications: Prolonged oral transit Pharyngeal Phase Impairments: Multiple swallows   Solid   GO    Solid: Impaired Oral Phase Impairments: Reduced lingual movement/coordination Pharyngeal Phase Impairments: Multiple swallows       Donavan Burnet, MS [facility] SLP [phone]

## 2013-03-25 NOTE — Progress Notes (Signed)
Clinical Social Work Department BRIEF PSYCHOSOCIAL ASSESSMENT 03/25/2013  Patient:  Christina Hopkins, Christina Hopkins     Account Number:  [id number]     Admit date:  03/23/2013  Clinical Social Worker:  Orpah Greek  Date/Time:  03/25/2013 10:49 AM  Referred by:  Physician  Date Referred:  03/25/2013 Referred for  SNF Placement   Other Referral:   Interview type:  Patient Other interview type:   and daughter via phone    PSYCHOSOCIAL DATA Living Status:  WITH ADULT CHILDREN Admitted from facility:   Level of care:   Primary support name:  Dian Situ (daughter) h#: [phone] c#: [phone] Primary support relationship to patient:  CHILD, ADULT Degree of support available:   good    CURRENT CONCERNS Current Concerns  Post-Acute Placement   Other Concerns:    SOCIAL WORK ASSESSMENT / PLAN CSW noted PT recommended SNF for patient at discharge.   Assessment/plan status:  Information/Referral to [facility] Other assessment/ plan:   Information/referral to community resources:   CSW completed FL2 and faxed information out to [facility] - will provide bed offers when available.    PATIENT'S/FAMILY'S RESPONSE TO PLAN OF CARE: Patient deferred decision to patient's daughter, [name] whom patient lives with. Daughter works at advance home care which leaves patient alone during the day. Per daughter, she had been to [facility] after colon cancer & open heart surgery in 2003. CSW to provide bed offers this afternoon, as patient's daughter will be by this evening to look over the bed offers.       Unice Bailey, LCSW [facility] Clinical Social Worker cell #: [phone]

## 2013-03-25 NOTE — Progress Notes (Signed)
Pt choking with coffee, and when asked if she has had a swallowing evaluation, she replied "they" have thickened my fluids before and I did not like it, and I am not going to do it again. Dr. Joseph Art made aware and ST eval ordered, pt informed and she may refuse service.   SW in room and spoke to pt about SNF admission.

## 2013-03-25 NOTE — Progress Notes (Addendum)
INITIAL NUTRITION ASSESSMENT  DOCUMENTATION CODES Per approved criteria  -Not Applicable   INTERVENTION: Provide Snacks BID Provide Ensure Pudding once daily Provide Multivitamin with minerals daily  NUTRITION DIAGNOSIS: Inadequate oral intake related to poor appetite/dysphagia as evidenced by pt's report and PO intake <50% of meals.   Goal: Pt to meet >/= 90% of their estimated nutrition needs   Monitor:  PO intake Weight Labs  Reason for Assessment: Consult  79 y.o. female  Admitting Dx: Fall  ASSESSMENT: 79 year old female with past medical history of HTN, DM, MM on chemotherapy, who presented to [facility] ED 03/23/2013 with main concern of bilateral chest area discomfort after the fall at home on the day of this admission.   Pt reports having a poor appetite but, she has been able to maintain her weight. Pt states that PTA she was drinking Boost supplements once daily and was trying to eat more; she started snacking on yogurt and pudding but, has not been able to eat more at mealtimes. Pt reports that Boost/Ensure cause diarrhea and she is not interested in drinking them while hospitalized. Pt also reports swallowing difficulty for the past year; states she does better with pudding like textures and eats mainly soft foods. Pt reports losing weight several months ago but, states she has been able to maintain her weight around 100 lbs for the past few months. Encouraged pt to continue use of supplements and continue snacking in between meals.   Per RN report, pt has been showing signs of dysphagia but, pt is refusing swallow evaluation.   Nutrition Focused Physical Exam:  Subcutaneous Fat:  Orbital Region: wnl Upper Arm Region: mild wasting Thoracic and Lumbar Region: NA  Muscle:  Temple Region: moderate wasting Clavicle Bone Region: mild wasting Clavicle and Acromion Bone Region: mild wasting Scapular Bone Region: wnl Dorsal Hand: moderate wasting Patellar Region: mild  wasting Anterior Thigh Region: mild/moderate wasting Posterior Calf Region: mild wasting  Edema: none  Height: Ht Readings from Last 1 Encounters:  03/23/13 4\' 9"  (1.448 m)    Weight: Wt Readings from Last 1 Encounters:  03/23/13 101 lb 13.6 oz (46.2 kg)    Ideal Body Weight: 95 lbs  % Ideal Body Weight: 106%  Wt Readings from Last 10 Encounters:  03/23/13 101 lb 13.6 oz (46.2 kg)  03/20/13 99 lb 8 oz (45.133 kg)  02/20/13 100 lb 4.8 oz (45.496 kg)  01/22/13 99 lb 8 oz (45.133 kg)  01/08/13 100 lb 8 oz (45.587 kg)  12/11/12 99 lb 11.2 oz (45.224 kg)  11/13/12 104 lb 4.8 oz (47.31 kg)  10/18/12 101 lb 14.4 oz (46.222 kg)  09/20/12 103 lb 1.6 oz (46.766 kg)  09/12/12 106 lb (48.081 kg)    Usual Body Weight: 115 lbs  % Usual Body Weight: 88%  BMI:  Body mass index is 22.03 kg/(m^2).  Estimated Nutritional Needs: Kcal: 1350-1550 Protein: 60-70 grams Fluid: 1.4-1.6 L/day  Skin: intact  Diet Order: General  EDUCATION NEEDS: -No education needs identified at this time   Intake/Output Summary (Last 24 hours) at 03/25/13 1257 Last data filed at 03/25/13 1115  Gross per 24 hour  Intake    120 ml  Output   2050 ml  Net  -1930 ml    Last BM: 11/7   Labs:   Recent Labs Lab 03/23/13 0240 03/23/13 1036 03/24/13 0506 03/25/13 0857  NA 131*  --  126* 130*  K 3.8  --  3.8 3.7  CL 94*  --  91* 93*  CO2 26  --  22 32  BUN 11  --  13 8  CREATININE 0.59  --  0.60 0.62  CALCIUM 9.9  --  9.5 9.3  MG  --  0.9* 2.0 1.4*  PHOS  --  3.6  --   --   GLUCOSE 161*  --  141* 126*    CBG (last 3)   Recent Labs  03/24/13 2129 03/25/13 0742 03/25/13 1204  GLUCAP 136* 133* 132*    Scheduled Meds: . acyclovir  400 mg Oral BID  . aspirin EC  81 mg Oral Daily  . carvedilol  25 mg Oral BID WC  . ceFEPime (MAXIPIME) IV  1 g Intravenous Q24H  . insulin aspart  0-9 Units Subcutaneous TID WC  . lisinopril  2.5 mg Oral QODAY  . metFORMIN  500 mg Oral BID WC  .  pantoprazole  40 mg Oral Daily  . sodium chloride  3 mL Intravenous Q12H  . spironolactone  25 mg Oral QODAY  . vancomycin  750 mg Intravenous Q24H    Continuous Infusions:   Past Medical History  Diagnosis Date  . Hypertension   . Diabetes mellitus   . Anemia   . Osteoporosis   . Dyslipidemia   . Myocardial infarction   . colon ca dx'd 2003    surg only  . Multiple myeloma dx'd 2009    oral chemo ongoing  . Coronary artery disease   . Shortness of breath   . GERD (gastroesophageal reflux disease)   . Wears glasses     Past Surgical History  Procedure Laterality Date  . Coronary artery bypass graft  08/2001    4 vessel  . Colon surgery  2003  . Tonsillectomy    . Appendectomy    . Abdominal hysterectomy    . Back surgery    . Eye surgery      cataracts  . Direct laryngoscopy with radiaesse injection Left 08/13/2012    Procedure: LEFT VOCAL CORD RADIESSE AUGMENTATION LARYNGOSCOPY ;  Surgeon: Flo Shanks, MD;  Location: [facility];  Service: ENT;  Laterality: Left;    Ian Malkin RD, LDN Inpatient Clinical Dietitian Pager: [phone] After Hours Pager: [phone]

## 2013-03-25 NOTE — Progress Notes (Signed)
Clinical Social Work Department CLINICAL SOCIAL WORK PLACEMENT NOTE 03/25/2013  Patient:  Christina Hopkins, Christina Hopkins  Account Number:  [id number] Admit date:  03/23/2013  Clinical Social Worker:  Orpah Greek  Date/time:  03/25/2013 10:56 AM  Clinical Social Work is seeking post-discharge placement for this patient at the following level of care:   SKILLED NURSING   (*CSW will update this form in Epic as items are completed)   03/25/2013  Patient/family provided with [facility] Department of Clinical Social Work's list of facilities offering this level of care within the geographic area requested by the patient (or if unable, by the patient's family).  03/25/2013  Patient/family informed of their freedom to choose among providers that offer the needed level of care, that participate in Medicare, Medicaid or managed care program needed by the patient, have an available bed and are willing to accept the patient.  03/25/2013  Patient/family informed of [facility]' ownership interest in [facility], as well as of the fact that they are under no obligation to receive care at this facility.  PASARR submitted to EDS on 03/25/2013 PASARR number received from EDS on 03/25/2013  FL2 transmitted to all facilities in geographic area requested by pt/family on  03/25/2013 FL2 transmitted to all facilities within larger geographic area on   Patient informed that his/her managed care company has contracts with or will negotiate with  certain facilities, including the following:     Patient/family informed of bed offers received:   Patient chooses bed at  Physician recommends and patient chooses bed at    Patient to be transferred to  on   Patient to be transferred to facility by   The following physician request were entered in Epic:   Additional Comments:   Unice Bailey, LCSW [facility] Clinical Social Worker cell #: [phone]

## 2013-03-26 DIAGNOSIS — IMO0002 Reserved for concepts with insufficient information to code with codable children: Secondary | ICD-10-CM

## 2013-03-26 LAB — CBC WITH DIFFERENTIAL/PLATELET
Basophils Relative: 0 % (ref 0–1)
Eosinophils Absolute: 0.1 10*3/uL (ref 0.0–0.7)
HCT: 27.7 % — ABNORMAL LOW (ref 36.0–46.0)
Hemoglobin: 9.6 g/dL — ABNORMAL LOW (ref 12.0–15.0)
Lymphocytes Relative: 13 % (ref 12–46)
Lymphs Abs: 0.7 10*3/uL (ref 0.7–4.0)
MCH: 35.3 pg — ABNORMAL HIGH (ref 26.0–34.0)
MCHC: 34.7 g/dL (ref 30.0–36.0)
MCV: 101.8 fL — ABNORMAL HIGH (ref 78.0–100.0)
Monocytes Absolute: 0.8 10*3/uL (ref 0.1–1.0)
Monocytes Relative: 15 % — ABNORMAL HIGH (ref 3–12)
Neutrophils Relative %: 70 % (ref 43–77)
RBC: 2.72 MIL/uL — ABNORMAL LOW (ref 3.87–5.11)

## 2013-03-26 LAB — COMPREHENSIVE METABOLIC PANEL
AST: 11 U/L (ref 0–37)
Albumin: 2.7 g/dL — ABNORMAL LOW (ref 3.5–5.2)
Calcium: 9.6 mg/dL (ref 8.4–10.5)
Creatinine, Ser: 0.75 mg/dL (ref 0.50–1.10)
GFR calc non Af Amer: 75 mL/min — ABNORMAL LOW (ref >90)
Potassium: 3.6 mEq/L (ref 3.5–5.1)

## 2013-03-26 LAB — GLUCOSE, CAPILLARY
Glucose-Capillary: 119 mg/dL — ABNORMAL HIGH (ref 70–99)
Glucose-Capillary: 154 mg/dL — ABNORMAL HIGH (ref 70–99)

## 2013-03-26 LAB — HEMOGLOBIN AND HEMATOCRIT, BLOOD
HCT: 28.4 % — ABNORMAL LOW (ref 36.0–46.0)
Hemoglobin: 10.1 g/dL — ABNORMAL LOW (ref 12.0–15.0)

## 2013-03-26 MED ORDER — ALBUTEROL SULFATE (5 MG/ML) 0.5% IN NEBU: 2.5000 mg | INHALATION_SOLUTION | RESPIRATORY_TRACT | Status: DC | PRN

## 2013-03-26 MED ORDER — MAGNESIUM SULFATE 40 MG/ML IJ SOLN
2.0000 g | Freq: Once | INTRAMUSCULAR | Status: AC
  Administered 2013-03-26: 2 g via INTRAVENOUS
  Filled 2013-03-26: qty 50

## 2013-03-26 MED ORDER — ALBUTEROL SULFATE (5 MG/ML) 0.5% IN NEBU
2.5000 mg | INHALATION_SOLUTION | Freq: Two times a day (BID) | RESPIRATORY_TRACT | Status: DC
  Administered 2013-03-26 – 2013-03-28 (×3): 2.5 mg via RESPIRATORY_TRACT
  Filled 2013-03-26 (×4): qty 0.5

## 2013-03-26 MED ORDER — IPRATROPIUM BROMIDE 0.02 % IN SOLN
0.5000 mg | Freq: Two times a day (BID) | RESPIRATORY_TRACT | Status: DC
  Administered 2013-03-26 – 2013-03-28 (×3): 0.5 mg via RESPIRATORY_TRACT
  Filled 2013-03-26 (×4): qty 2.5

## 2013-03-26 NOTE — Progress Notes (Signed)
Physical Therapy Treatment Patient Details Name: Christina Hopkins MRN: [id number] DOB: 03/18/1926 Today's Date: 03/26/2013 Time: 4098-1191 PT Time Calculation (min): 25 min  PT Assessment / Plan / Recommendation  History of Present Illness     PT Comments   Pt in bed on 2 lts O2 at 100%.  Assisted OOB to [facility] for void/BM.  Assisted with amb in hallway then positioned in recliner and requested pain meds.     Follow Up Recommendations  SNF     Does the patient have the potential to tolerate intense rehabilitation     Barriers to Discharge        Equipment Recommendations       Recommendations for Other Services    Frequency Min 3X/week   Progress towards PT Goals Progress towards PT goals: Progressing toward goals  Plan      Precautions / Restrictions Precautions Precautions: Fall Precaution Comments: L side rib fractures Restrictions Weight Bearing Restrictions: No    Pertinent Vitals/Pain C/o L side pain "alot" meds requested    Mobility  Bed Mobility Bed Mobility: Supine to Sit Supine to Sit: HOB elevated;3: Mod assist Details for Bed Mobility Assistance: increased time and assist to rise due to L side rib pain. Transfers Transfers: Sit to Stand;Stand to Sit Sit to Stand: 3: Mod assist;From bed;From toilet Stand to Sit: 3: Mod assist;To toilet;To chair/3-in-1 Details for Transfer Assistance: 50% VC's on proper hand placement and turn completion prior to sit Ambulation/Gait Ambulation/Gait Assistance: 1: +2 Total assist Ambulation/Gait: Patient Percentage: 70% Ambulation Distance (Feet): 48 Feet Assistive device: Rolling walker Ambulation/Gait Assistance Details: increased time and 25% VC's on safety with turns and backward gait completion prior to sit.  Gait Pattern: Step-to pattern;Step-through pattern;Trunk flexed Gait velocity: decreased     PT Goals (current goals can now be found in the care plan section)    Visit Information  Last PT Received On:  03/26/13 Assistance Needed: +2    Subjective Data      Cognition       Balance     End of Session PT - End of Session Equipment Utilized During Treatment: Oxygen Activity Tolerance: Patient limited by pain;Patient limited by fatigue Patient left: in chair;with call bell/phone within reach   Felecia Shelling  PTA [facility]  Acute  Rehab Pager      [phone]

## 2013-03-26 NOTE — Progress Notes (Signed)
Patient's daughter, Christina Hopkins accepted bed offer @ [facility]. Anticipating discharge tomorrow.   Clinical Social Work Department CLINICAL SOCIAL WORK PLACEMENT NOTE 03/26/2013  Patient:  Christina, Hopkins  Account Number:  [id number] Admit date:  03/23/2013  Clinical Social Worker:  Orpah Greek  Date/time:  03/25/2013 10:56 AM  Clinical Social Work is seeking post-discharge placement for this patient at the following level of care:   SKILLED NURSING   (*CSW will update this form in Epic as items are completed)   03/25/2013  Patient/family provided with [facility] Department of Clinical Social Work's list of facilities offering this level of care within the geographic area requested by the patient (or if unable, by the patient's family).  03/25/2013  Patient/family informed of their freedom to choose among providers that offer the needed level of care, that participate in Medicare, Medicaid or managed care program needed by the patient, have an available bed and are willing to accept the patient.  03/25/2013  Patient/family informed of [facility]' ownership interest in [facility], as well as of the fact that they are under no obligation to receive care at this facility.  PASARR submitted to EDS on 03/25/2013 PASARR number received from EDS on 03/25/2013  FL2 transmitted to all facilities in geographic area requested by pt/family on  03/25/2013 FL2 transmitted to all facilities within larger geographic area on   Patient informed that his/her managed care company has contracts with or will negotiate with  certain facilities, including the following:     Patient/family informed of bed offers received:  03/26/2013 Patient chooses bed at [facility] [place] Physician recommends and patient chooses bed at    Patient to be transferred to [facility] on   Patient to be transferred to facility by   The following  physician request were entered in Epic:   Additional Comments:   Unice Bailey, LCSW [facility] Clinical Social Worker cell #: [phone]

## 2013-03-26 NOTE — Progress Notes (Signed)
[facility] PROGRESS NOTE  Christina Hopkins OZH:[id number] DOB: 1926/03/19 DOA: 03/23/2013 PCP: Johny Blamer, MD  Assessment/Plan: Fall, mechanical  - likely mechanical with subsequent ribs' fracture  - continue to provide supportive care with analgesia PRN  - PT evaluation once pt is able to participate; order in place  -PT evaluation; recommends SNF placement   Magnesium deficiency  - severe, given 4 gm of Mg via IV 03/23/2013  - follow up magnesium level this am   Fracture of multiple ribs status post mechanical fall(Subacute/ healing fractures of the left 7th, 8th, and 9th ribs) - supportive care, analgesia as needed   HCAP  - given Multiple Myeloma and active chemotherapy, will treat this as HCAP  - continue vancomycin and cefepime  -Start DuoNeb 4 times a day -Mucinex twice a day - follow up legionella negative   - strep pneumoniae is negative  -Obtain ambulatory SpO2  Multiple myeloma  - Spoke Dr. Rosie Fate 11/11 and will hold all chemotherapy agents  Anemia of chronic disease  - likely combination of anemia of chronic disease due to multiple myeloma and sequela of chemotherapy as well as iron deficiency anemia  - A.m. Hg and Hct dropped to 9.6/27.7 (lab error?) Will repeat H./H.  - No indications for transfusion   Thrombocytopenia  - secondary to multiple myeloma, sequela of chemotherapy and possibly Lovenox. Stable  - d/c Lovenox and use SCD for DVT prophylaxis   Hyponatremia (asymptomatic) - secondary to dehydration, pre-renal etiology; decreased to 127 this a.m., will repeat       Diabetes mellitus  - A1c at target range on this admission  - DC Metformin and controlled with sliding scale insulin     Coronary artery disease  - continue Aspirin and Coreg   Hypertension  - reasonable BP on admission  - Continue Lisinopril, Coreg, Spironolactone  -Obtain echocardiogram (none available in Epic); proBNP 03/23/2013 = 2180  Moderate protein calorie  malnutrition  - nutrition consulted; per nutrition at 106% of ideal body weight  Oxygen desaturation on ambulation  - SpO2 = 93% on RA   - Continue O2 when necessary to maintain SpO2 > 93%  -During waking hours incentive spirometry  Hypomagnesium -Replete, goal of> 2    Code Status: Full Family Communication:  Disposition Plan:   Consultants:  Notify Dr. Rosie Fate in am of patient's admission; he was added as a Research scientist (medical) in EPIC; per note from 03/20/2013 the following is her current chemotherapy; 1. Velcade, Cytoxan and Decadron every 2 weeks from 08/29/2012.  2. Vitamin B12 mg IM monthly.  3. Aranesp 300 mcg subcu every 2 weeks for hemoglobin less than or equal to 10.  HOLD ALL CHEMOTHERAPY UNTIL DR. Rosie Fate INITIATES    Procedures/Studies:  Dg Chest 2 View 03/23/2013 IMPRESSION: Lungs hypoexpanded; left basilar airspace opacification raises concern for pneumonia.   Ct Chest W Contrast 03/23/2013 IMPRESSION: 1. Acute minimally displaced fractures of the left posterior 10th and 11th ribs. 2. Subacute healing fractures of the left lateral 7th, 8th, and 9th ribs. 3. Bibasilar opacities, left greater than right. Atelectasis is favored, although superimposed infection is not excluded. 4. Cardiomegaly with diffuse 3 vessel coronary artery calcifications. 5. Large hiatal hernia 6. Diffuse osteopenia.    Antibiotics:  Cefepime 03/23/2013 -->  Vancomycin 03/23/2013 -->  Acyclovir 03/23/2013 -->>    HPI/Subjective: 79 year old female with past medical history of HTN, DM, multiple myeloma on chemotherapy, who presented to [facility] ED 03/23/2013 with main concern of bilateral chest area  discomfort after the fall at home on the day of this admission. Patient fell in the bathroom as she was reaching for the gown. There was no prodromal symptoms prior to the fall. Patient reported chest/rib area pain bilaterally and worse with breathing or movement. Her pain is better controlled so far once given  analgesia.  In ED, pt found to be hemodynamically stable with initial BP 94/50 but this has improved to 150/73 with IV fluids given in ED. Oxygen saturation was 94% on 2 L nasal canula. Further studies included CT chest which revealed left sided rib fracture noted and worrisome findings for PNA. 03/25/2013 states (+) Lt lateral rib cage pain w/ inspiration. Otherwise no complaints. TODAY sitting comfortably in chair however states had some DOE with ambulation during PT this morning.     Objective: Filed Vitals:   03/25/13 2106 03/26/13 0500 03/26/13 0720 03/26/13 0900  BP: 94/48 123/69  110/52  Pulse: 78 77    Temp: 98.9 F (37.2 C) 98 F (36.7 C)    TempSrc: Oral     Resp: 20 20    Height:      Weight:      SpO2: 93% 97% 90% 99%    Intake/Output Summary (Last 24 hours) at 03/26/13 0923 Last data filed at 03/26/13 0700  Gross per 24 hour  Intake    720 ml  Output    750 ml  Net    -30 ml   Filed Weights   03/23/13 0926  Weight: 46.2 kg (101 lb 13.6 oz)    Exam:   General:  A./O. x4, NAD except when palpated along left lateral rib cage   Cardiovascular: Regular rhythm and rate, negative murmurs rubs gallops  Respiratory: Clear to auscultation bilateral with mild expiratory wheezing in bilateral apices   Abdomen: Soft, nontender, plus bowel sounds  Musculoskeletal: Positive pain to palpation along left lateral aspect of rib cage     Data Reviewed: Basic Metabolic Panel:  Recent Labs Lab 03/20/13 0805 03/23/13 0240 03/23/13 1036 03/24/13 0506 03/25/13 0857 03/26/13 0412  NA 138 131*  --  126* 130* 127*  K 4.1 3.8  --  3.8 3.7 3.6  CL  --  94*  --  91* 93* 91*  CO2 24 26  --  22 32 29  GLUCOSE 182* 161*  --  141* 126* 162*  BUN 9.9 11  --  13 8 13   CREATININE 0.7 0.59  --  0.60 0.62 0.75  CALCIUM 10.0 9.9  --  9.5 9.3 9.6  MG  --   --  0.9* 2.0 1.4* 1.3*  PHOS  --   --  3.6  --   --   --    Liver Function Tests:  Recent Labs Lab 03/20/13 0805  03/25/13 0857 03/26/13 0412  AST 14 12 11   ALT 9 7 8   ALKPHOS 54 41 44  BILITOT 0.50 0.7 0.4  PROT 6.4 6.1 5.7*  ALBUMIN 3.4* 2.8* 2.7*   No results found for this basename: LIPASE, AMYLASE,  in the last 168 hours No results found for this basename: AMMONIA,  in the last 168 hours CBC:  Recent Labs Lab 03/20/13 0805 03/23/13 0240 03/24/13 0506 03/25/13 0857 03/26/13 0412  WBC 5.1 4.5 4.5 4.6 5.7  NEUTROABS 3.8  --   --  3.0 4.0  HGB 10.8* 11.1* 11.0* 10.5* 9.6*  HCT 32.4* 32.6* 31.0* 29.8* 27.7*  MCV 105.5* 104.2* 98.7 101.0* 101.8*  PLT 153 116*  116* 126* 125*   Cardiac Enzymes: No results found for this basename: CKTOTAL, CKMB, CKMBINDEX, TROPONINI,  in the last 168 hours BNP (last 3 results)  Recent Labs  03/23/13 1036  PROBNP 2180.0*   CBG:  Recent Labs Lab 03/25/13 0742 03/25/13 1204 03/25/13 1703 03/25/13 2109 03/26/13 0747  GLUCAP 133* 132* 171* 129* 119*    Recent Results (from the past 240 hour(s))  CULTURE, BLOOD (ROUTINE X 2)     Status: None   Collection Time    03/23/13 10:36 AM      Result Value Range Status   Specimen Description BLOOD RIGHT ARM   Final   Special Requests     Final   Value: BOTTLES DRAWN AEROBIC AND ANAEROBIC 4CC BOTH BOTTLES   Culture  Setup Time     Final   Value: 03/23/2013 14:27     Performed at [facility]   Culture     Final   Value:        BLOOD CULTURE RECEIVED NO GROWTH TO DATE CULTURE WILL BE HELD FOR 5 DAYS BEFORE ISSUING A FINAL NEGATIVE REPORT     Performed at [facility]   Report Status PENDING   Incomplete  CULTURE, BLOOD (ROUTINE X 2)     Status: None   Collection Time    03/23/13 10:45 AM      Result Value Range Status   Specimen Description BLOOD RIGHT HAND   Final   Special Requests     Final   Value: BOTTLES DRAWN AEROBIC AND ANAEROBIC 3CC BOTH BOTTLES   Culture  Setup Time     Final   Value: 03/23/2013 14:26     Performed at [facility]   Culture     Final    Value:        BLOOD CULTURE RECEIVED NO GROWTH TO DATE CULTURE WILL BE HELD FOR 5 DAYS BEFORE ISSUING A FINAL NEGATIVE REPORT     Performed at [facility]   Report Status PENDING   Incomplete     Studies: No results found.  Scheduled Meds: . acyclovir  400 mg Oral BID  . ipratropium  0.5 mg Nebulization BID   And  . albuterol  2.5 mg Nebulization BID  . aspirin EC  81 mg Oral Daily  . carvedilol  25 mg Oral BID WC  . ceFEPime (MAXIPIME) IV  1 g Intravenous Q24H  . feeding supplement (ENSURE)  1 Container Oral Q24H  . guaiFENesin  600 mg Oral BID  . insulin aspart  0-9 Units Subcutaneous TID WC  . lisinopril  2.5 mg Oral QODAY  . multivitamin with minerals  1 tablet Oral Daily  . pantoprazole  40 mg Oral Daily  . sodium chloride  3 mL Intravenous Q12H  . spironolactone  25 mg Oral QODAY  . vancomycin  750 mg Intravenous Q24H   Continuous Infusions:   Principal Problem:   Fall Active Problems:   Multiple myeloma   Anemia, iron deficiency   Magnesium deficiency   Diabetes mellitus   Hypertension   Coronary artery disease   Fracture of multiple ribs   PNA (pneumonia)   Thrombocytopenia, unspecified   Hyponatremia   Moderate protein-calorie malnutrition    Time spent: 30 min    Christina Hopkins, J  [facility] Pager [phone]. If 7PM-7AM, please contact night-coverage at [email or web address], password [facility] 03/26/2013, 9:23 AM  LOS: 3 days

## 2013-03-26 NOTE — Progress Notes (Signed)
ANTIBIOTIC CONSULT NOTE - FOLLOW UP  Pharmacy Consult for Vancomycin, Cefepime Indication: rule out pneumonia  Allergies  Allergen Reactions  . Gemfibrozil Nausea And Vomiting  . Nifedipine Hives  . Questran [Cholestyramine] Other (See Comments)    Do not remember    Patient Measurements: Height: 4\' 9"  (144.8 cm) Weight: 101 lb 13.6 oz (46.2 kg) IBW/kg (Calculated) : 38.6  Vital Signs: Temp: 98 F (36.7 C) (11/11 0500) BP: 110/52 mmHg (11/11 0900) Pulse Rate: 77 (11/11 0500) Intake/Output from previous day: 11/10 0701 - 11/11 0700 In: 720 [P.O.:720] Out: 750 [Urine:750]  Labs:  Recent Labs  03/24/13 0506 03/25/13 0857 03/26/13 0412 03/26/13 1112  WBC 4.5 4.6 5.7  --   HGB 11.0* 10.5* 9.6* 10.1*  PLT 116* 126* 125*  --   CREATININE 0.60 0.62 0.75  --    Estimated Creatinine Clearance: 31.3 ml/min (by C-G formula based on Cr of 0.75).  No results found for this basename: Rolm [place], [place], GENTTROUGH, GENTPEAK, GENTRANDOM, TOBRATROUGH, TOBRAPEAK, TOBRARND, AMIKACINPEAK, AMIKACINTROU, AMIKACIN,  in the last 72 hours   Microbiology: 11/8 blood x2: ngtd 11/8 sputum: ordered 11/8 legionella/strep pneumo: negative   Anti-infectives: PTA >> Acyclovir >>  11/8 >> Vanc >> 11/8 >> Cefepime >>   Assessment: 27 yoF with h/o multiple myeloma currently on chemotherapy presented 11/8 s/p fall and reported diarrhea since last chemo 11/5. CT chest and CXR with bibasilar opacities - concerning for PNA. CT chest also showed acute rib fractures. MD ordered Azithro/Ceftriaxone for PNA but given recent chemotherapy, if truly PNA - pt would be classified as having HCAP. MD contacted and change abx to Vancomycin and Cefepime.  Day # 4 Vancomycin and Cefepime  Tmax: afeb   WBCs: wnl  Renal: Scr 0.75, CrCl ~  31 ml/min CG  Cultures remain negative.  Goal of Therapy:  Vancomycin trough level 15-20 mcg/ml  Plan:   Continue Zosyn 3.375g IV Q8H infused over  4hrs.  Continue Vancomycin 750mg  IV q24h.  Measure Vanc trough at steady state, tomorrow morning.  Follow up renal fxn and culture results.  Follow up narrowing antibiotic therapy.   Lynann Beaver PharmD, BCPS Pager [phone] 03/26/2013 1:39 PM

## 2013-03-27 DIAGNOSIS — I319 Disease of pericardium, unspecified: Secondary | ICD-10-CM

## 2013-03-27 DIAGNOSIS — D649 Anemia, unspecified: Secondary | ICD-10-CM

## 2013-03-27 DIAGNOSIS — E538 Deficiency of other specified B group vitamins: Secondary | ICD-10-CM

## 2013-03-27 LAB — GLUCOSE, CAPILLARY
Glucose-Capillary: 135 mg/dL — ABNORMAL HIGH (ref 70–99)
Glucose-Capillary: 95 mg/dL (ref 70–99)
Glucose-Capillary: 124 mg/dL — ABNORMAL HIGH (ref 70–99)

## 2013-03-27 LAB — CBC WITH DIFFERENTIAL/PLATELET
Basophils Absolute: 0 10*3/uL (ref 0.0–0.1)
Basophils Relative: 0 % (ref 0–1)
Eosinophils Relative: 1 % (ref 0–5)
HCT: 30.5 % — ABNORMAL LOW (ref 36.0–46.0)
MCHC: 34.8 g/dL (ref 30.0–36.0)
MCV: 101.7 fL — ABNORMAL HIGH (ref 78.0–100.0)
Monocytes Absolute: 0.4 10*3/uL (ref 0.1–1.0)
Neutro Abs: 3.8 10*3/uL (ref 1.7–7.7)
Platelets: 157 10*3/uL (ref 150–400)
RDW: 13.1 % (ref 11.5–15.5)
WBC: 4.8 10*3/uL (ref 4.0–10.5)

## 2013-03-27 LAB — COMPREHENSIVE METABOLIC PANEL
ALT: 8 U/L (ref 0–35)
AST: 11 U/L (ref 0–37)
Alkaline Phosphatase: 49 U/L (ref 39–117)
BUN: 13 mg/dL (ref 6–23)
Chloride: 90 mEq/L — ABNORMAL LOW (ref 96–112)
GFR calc Af Amer: >90 mL/min (ref >90)
Glucose, Bld: 141 mg/dL — ABNORMAL HIGH (ref 70–99)
Potassium: 3.8 mEq/L (ref 3.5–5.1)
Sodium: 127 mEq/L — ABNORMAL LOW (ref 135–145)
Total Bilirubin: 0.5 mg/dL (ref 0.3–1.2)
Total Protein: 6.2 g/dL (ref 6.0–8.3)

## 2013-03-27 LAB — EXPECTORATED SPUTUM ASSESSMENT W GRAM STAIN, RFLX TO RESP C

## 2013-03-27 LAB — MAGNESIUM: Magnesium: 1.8 mg/dL (ref 1.5–2.5)

## 2013-03-27 MED ORDER — CEFIXIME 400 MG PO TABS: 200.0000 mg | ORAL_TABLET | Freq: Every day | ORAL | Status: DC

## 2013-03-27 MED ORDER — CEFIXIME 400 MG PO TABS: 400.0000 mg | ORAL_TABLET | Freq: Every day | ORAL | Status: DC

## 2013-03-27 MED ORDER — CEFPODOXIME PROXETIL 200 MG PO TABS
200.0000 mg | ORAL_TABLET | Freq: Two times a day (BID) | ORAL | Status: DC
  Administered 2013-03-28: 200 mg via ORAL
  Filled 2013-03-27 (×2): qty 1

## 2013-03-27 MED ORDER — AZITHROMYCIN 500 MG PO TABS
500.0000 mg | ORAL_TABLET | Freq: Every day | ORAL | Status: DC
  Administered 2013-03-27 – 2013-03-28 (×2): 500 mg via ORAL
  Filled 2013-03-27 (×2): qty 1

## 2013-03-27 NOTE — Progress Notes (Signed)
[facility] PROGRESS NOTE  CASH MEADOW ZOX:[id number] DOB: Oct 03, 1925 DOA: 03/23/2013 PCP: Johny Blamer, MD  Assessment/Plan: Fall, mechanical  - likely mechanical with subsequent ribs' fracture  - continue to provide supportive care with analgesia PRN  - PT evaluation once pt is able to participate; order in place  -PT evaluation; recommends SNF placement  Magnesium deficiency  - severe, given 4 gm of Mg via IV 03/23/2013  - replace as needed Fracture of multiple ribs status post mechanical fall(Subacute/ healing fractures of the left 7th, 8th, and 9th ribs)  - supportive care, analgesia as needed  HCAP  - given Multiple Myeloma and active chemotherapy, will treat this as HCAP  - continue vancomycin and cefepime - Consider transitioning to PO abx  -Started DuoNeb 4 times a day  -Mucinex twice a day  - follow up legionella negative  - strep pneumoniae is negative  -Obtain ambulatory SpO2  Multiple myeloma  - Spoke Dr. Rosie Fate 11/11 and will hold all chemotherapy agents  Anemia of chronic disease  - likely combination of anemia of chronic disease due to multiple myeloma and sequela of chemotherapy as well as iron deficiency anemia  Thrombocytopenia  - secondary to multiple myeloma, sequela of chemotherapy and possibly Lovenox. Stable  - d/c Lovenox and use SCD for DVT prophylaxis  Hyponatremia (asymptomatic)  - stable - Will check urine and serum osm, urine sodium Diabetes mellitus  - A1c at target range on this admission  - DC'd Metformin and controlled with sliding scale insulin  Coronary artery disease  - continue Aspirin and Coreg  Hypertension  - reasonable BP on admission  - Continue Lisinopril, Coreg, Spironolactone  -Obtain echocardiogram (none available in Epic); proBNP 03/23/2013 = 2180  Moderate protein calorie malnutrition  - nutrition consulted; per nutrition at 106% of ideal body weight  Oxygen desaturation on ambulation  - SpO2 = 93% on RA  -  Continue O2 when necessary to maintain SpO2 > 93%  -During waking hours incentive spirometry  Hypomagnesium  -Replete, goal of> 2  Code Status: Full Family Communication: Pt in room (indicate person spoken with, relationship, and if by phone, the number) Disposition Plan: Pending   Consultants:  Dr. Rosie Fate  Antibiotics: Cefepime 03/23/2013 -->03/27/13  Vancomycin 03/23/2013 -->03/27/13  Acyclovir 03/23/2013 -->> Azithromycin 03/27/13>>> Cefixime 03/27/13>>>  HPI/Subjective: No complaints. Reports feeling well.  Objective: Filed Vitals:   03/26/13 2005 03/26/13 2218 03/27/13 0326 03/27/13 1432  BP:  100/56 131/53 100/56  Pulse:  85 8 78  Temp:  98.5 F (36.9 C) 97.6 F (36.4 C) 98.5 F (36.9 C)  TempSrc:  Oral Oral Oral  Resp:  12 16 18   Height:      Weight:   46.902 kg (103 lb 6.4 oz)   SpO2: 96% 97% 99% 99%    Intake/Output Summary (Last 24 hours) at 03/27/13 1554 Last data filed at 03/27/13 1433  Gross per 24 hour  Intake   1800 ml  Output   1500 ml  Net    300 ml   Filed Weights   03/23/13 0926 03/27/13 0326  Weight: 46.2 kg (101 lb 13.6 oz) 46.902 kg (103 lb 6.4 oz)    Exam:   General:  Awake, in nad  Cardiovascular: regular, s1, s2  Respiratory: normal resp effort, no wheezing  Abdomen: soft, nondistended  Musculoskeletal: perfused, no clubbing   Data Reviewed: Basic Metabolic Panel:  Recent Labs Lab 03/23/13 0240 03/23/13 1036 03/24/13 0506 03/25/13 0857 03/26/13 0412 03/26/13 1112 03/27/13  0433  NA 131*  --  126* 130* 127* 127* 127*  K 3.8  --  3.8 3.7 3.6  --  3.8  CL 94*  --  91* 93* 91*  --  90*  CO2 26  --  22 32 29  --  29  GLUCOSE 161*  --  141* 126* 162*  --  141*  BUN 11  --  13 8 13   --  13  CREATININE 0.59  --  0.60 0.62 0.75  --  0.65  CALCIUM 9.9  --  9.5 9.3 9.6  --  10.1  MG  --  0.9* 2.0 1.4* 1.3*  --  1.8  PHOS  --  3.6  --   --   --   --   --    Liver Function Tests:  Recent Labs Lab 03/25/13 0857  03/26/13 0412 03/27/13 0433  AST 12 11 11   ALT 7 8 8   ALKPHOS 41 44 49  BILITOT 0.7 0.4 0.5  PROT 6.1 5.7* 6.2  ALBUMIN 2.8* 2.7* 2.9*   No results found for this basename: LIPASE, AMYLASE,  in the last 168 hours No results found for this basename: AMMONIA,  in the last 168 hours CBC:  Recent Labs Lab 03/23/13 0240 03/24/13 0506 03/25/13 0857 03/26/13 0412 03/26/13 1112 03/27/13 0433  WBC 4.5 4.5 4.6 5.7  --  4.8  NEUTROABS  --   --  3.0 4.0  --  3.8  HGB 11.1* 11.0* 10.5* 9.6* 10.1* 10.6*  HCT 32.6* 31.0* 29.8* 27.7* 28.4* 30.5*  MCV 104.2* 98.7 101.0* 101.8*  --  101.7*  PLT 116* 116* 126* 125*  --  157   Cardiac Enzymes: No results found for this basename: CKTOTAL, CKMB, CKMBINDEX, TROPONINI,  in the last 168 hours BNP (last 3 results)  Recent Labs  03/23/13 1036  PROBNP 2180.0*   CBG:  Recent Labs Lab 03/26/13 1225 03/26/13 1722 03/26/13 2225 03/27/13 0744 03/27/13 1204  GLUCAP 154* 152* 172* 135* 95    Recent Results (from the past 240 hour(s))  CULTURE, BLOOD (ROUTINE X 2)     Status: None   Collection Time    03/23/13 10:36 AM      Result Value Range Status   Specimen Description BLOOD RIGHT ARM   Final   Special Requests     Final   Value: BOTTLES DRAWN AEROBIC AND ANAEROBIC 4CC BOTH BOTTLES   Culture  Setup Time     Final   Value: 03/23/2013 14:27     Performed at [facility]   Culture     Final   Value:        BLOOD CULTURE RECEIVED NO GROWTH TO DATE CULTURE WILL BE HELD FOR 5 DAYS BEFORE ISSUING A FINAL NEGATIVE REPORT     Performed at [facility]   Report Status PENDING   Incomplete  CULTURE, BLOOD (ROUTINE X 2)     Status: None   Collection Time    03/23/13 10:45 AM      Result Value Range Status   Specimen Description BLOOD RIGHT HAND   Final   Special Requests     Final   Value: BOTTLES DRAWN AEROBIC AND ANAEROBIC 3CC BOTH BOTTLES   Culture  Setup Time     Final   Value: 03/23/2013 14:26     Performed at  [facility]   Culture     Final   Value:  BLOOD CULTURE RECEIVED NO GROWTH TO DATE CULTURE WILL BE HELD FOR 5 DAYS BEFORE ISSUING A FINAL NEGATIVE REPORT     Performed at [facility]   Report Status PENDING   Incomplete  CULTURE, EXPECTORATED SPUTUM-ASSESSMENT     Status: None   Collection Time    03/27/13 12:42 PM      Result Value Range Status   Specimen Description SPUTUM   Final   Special Requests NONE   Final   Sputum evaluation     Final   Value: THIS SPECIMEN IS ACCEPTABLE. RESPIRATORY CULTURE REPORT TO FOLLOW.   Report Status 03/27/2013 FINAL   Final     Studies: No results found.  Scheduled Meds: . acyclovir  400 mg Oral BID  . ipratropium  0.5 mg Nebulization BID   And  . albuterol  2.5 mg Nebulization BID  . aspirin EC  81 mg Oral Daily  . carvedilol  25 mg Oral BID WC  . ceFEPime (MAXIPIME) IV  1 g Intravenous Q24H  . feeding supplement (ENSURE)  1 Container Oral Q24H  . guaiFENesin  600 mg Oral BID  . insulin aspart  0-9 Units Subcutaneous TID WC  . lisinopril  2.5 mg Oral QODAY  . multivitamin with minerals  1 tablet Oral Daily  . pantoprazole  40 mg Oral Daily  . sodium chloride  3 mL Intravenous Q12H  . spironolactone  25 mg Oral QODAY  . vancomycin  750 mg Intravenous Q24H   Continuous Infusions:   Principal Problem:   Fall Active Problems:   Multiple myeloma   Anemia, iron deficiency   Magnesium deficiency   Diabetes mellitus   Hypertension   Coronary artery disease   Fracture of multiple ribs   PNA (pneumonia)   Thrombocytopenia, unspecified   Hyponatremia   Moderate protein-calorie malnutrition   Hypomagnesemia    Time spent:    Fumie Fiallo K  [facility] Pager [phone]. If 7PM-7AM, please contact night-coverage at [email or web address], password [facility] 03/27/2013, 3:54 PM  LOS: 4 days

## 2013-03-27 NOTE — Progress Notes (Signed)
ANTIBIOTIC CONSULT NOTE - FOLLOW UP  Pharmacy Consult for Vancomycin, Cefepime Indication:  pneumonia  Allergies  Allergen Reactions  . Gemfibrozil Nausea And Vomiting  . Nifedipine Hives  . Questran [Cholestyramine] Other (See Comments)    Do not remember    Patient Measurements: Height: 4\' 9"  (144.8 cm) Weight: 103 lb 6.4 oz (46.902 kg) IBW/kg (Calculated) : 38.6  Vital Signs: Temp: 98.5 F (36.9 C) (11/12 1432) Temp src: Oral (11/12 1432) BP: 100/56 mmHg (11/12 1432) Pulse Rate: 78 (11/12 1432) Intake/Output from previous day: 11/11 0701 - 11/12 0700 In: 1750 [P.O.:1500; IV Piggyback:250] Out: 1200 [Urine:1200]  Labs:  Recent Labs  03/25/13 0857 03/26/13 0412 03/26/13 1112 03/27/13 0433  WBC 4.6 5.7  --  4.8  HGB 10.5* 9.6* 10.1* 10.6*  PLT 126* 125*  --  157  CREATININE 0.62 0.75  --  0.65   Estimated Creatinine Clearance: 34 ml/min (by C-G formula based on Cr of 0.65).   Recent Labs  03/27/13 1008  VANCOTROUGH 11.0     Microbiology: 11/8 blood x2: ngtd 11/8 legionella/strep pneumo: negative  11/12 sputum: collected  Anti-infectives: PTA >> Acyclovir >>  11/8 >> Vanc >> 11/8 >> Cefepime >>   Assessment: 31 yoF with h/o multiple myeloma presented 11/8 s/p fall and reported diarrhea since last chemo 11/5. CT chest and CXR with bibasilar opacities concerning for PNA. MD ordered azithro/ceftriaxone for PNA but given recent chemotherapy, if truly PNA - pt would be classified as having HCAP. MD contacted and changed abx to vancomycin and cefepime.  Day # 5 vancomycin 750 mg IV q24h and cefepime 1 gram IV q24h  Tmax: afeb   WBCs: wnl  Renal: Scr 0.65, CrCl ~  34 ml/min CG  Blood cultures remain negative to date.  Sputum culture was just collected.  Vancomycin trough 11 mg/L following 4th dose - slightly below goal range. Anticipate further accumulation of drug may occur and am concerned regarding the potential for nephrotoxicity if dosage is  unnecessarily increased in this elderly patient.  Goal of Therapy: Eradication of infection  Vancomycin trough level 15-20 mcg/ml Dose cefepime based on renal function  Plan:   Continue vancomycin 750mg  IV q24h.  Continue cefepime 1 gram IV q24h.  Follow culture results, serum creatinine, and clinical course.  Suggest considering narrowing antimicrobial coverage when clinically appropriate.  Elie Goody, PharmD, BCPS Pager: [phone] 03/27/2013  3:45 PM

## 2013-03-27 NOTE — Progress Notes (Signed)
Christina Hopkins   DOB:10/03/1925   ZO#:[id number]   JWJ#:[id number]  Subjective: Patient seen and examined. Patient states she feels little better overall.  She denies any fevers or chills.  She reports a normal bowel movement.   She has been on  Velcade, Cytoxan and Decadron every 2 weeks from 08/29/2012;  Vitamin B12 mg IM monthly and Aranesp 300 mcg subcu every 2 weeks for hemoglobin less than or equal to 10.   Objective:  Filed Vitals:   03/27/13 1432  BP: 100/56  Pulse: 78  Temp: 98.5 F (36.9 C)  Resp: 18    Body mass index is 22.37 kg/(m^2).  Intake/Output Summary (Last 24 hours) at 03/27/13 1459 Last data filed at 03/27/13 1433  Gross per 24 hour  Intake   1800 ml  Output   1500 ml  Net    300 ml    Chronically ill appearing female; kyphotic laying in bed  Sclerae unicteric  Oropharynx clear  Lungs  decreased breath sounds at the bases  Heart regular rate and rhythm  Abdomen benign  Neuro nonfocal  Ext: TEDs on; no edema.     CBG (last 3)   Recent Labs  03/26/13 2225 03/27/13 0744 03/27/13 1204  GLUCAP 172* 135* 95     Labs:  Lab Results  Component Value Date   WBC 4.8 03/27/2013   HGB 10.6* 03/27/2013   HCT 30.5* 03/27/2013   MCV 101.7* 03/27/2013   PLT 157 03/27/2013   NEUTROABS 3.8 03/27/2013   Basic Metabolic Panel:  Recent Labs Lab 03/23/13 0240 03/23/13 1036 03/24/13 0506 03/25/13 0857 03/26/13 0412 03/26/13 1112 03/27/13 0433  NA 131*  --  126* 130* 127* 127* 127*  K 3.8  --  3.8 3.7 3.6  --  3.8  CL 94*  --  91* 93* 91*  --  90*  CO2 26  --  22 32 29  --  29  GLUCOSE 161*  --  141* 126* 162*  --  141*  BUN 11  --  13 8 13   --  13  CREATININE 0.59  --  0.60 0.62 0.75  --  0.65  CALCIUM 9.9  --  9.5 9.3 9.6  --  10.1  MG  --  0.9* 2.0 1.4* 1.3*  --  1.8  PHOS  --  3.6  --   --   --   --   --    GFR Estimated Creatinine Clearance: 34 ml/min (by C-G formula based on Cr of 0.65). Liver Function Tests:  Recent Labs Lab  03/25/13 0857 03/26/13 0412 03/27/13 0433  AST 12 11 11   ALT 7 8 8   ALKPHOS 41 44 49  BILITOT 0.7 0.4 0.5  PROT 6.1 5.7* 6.2  ALBUMIN 2.8* 2.7* 2.9*   CBC:  Recent Labs Lab 03/23/13 0240 03/24/13 0506 03/25/13 0857 03/26/13 0412 03/26/13 1112 03/27/13 0433  WBC 4.5 4.5 4.6 5.7  --  4.8  NEUTROABS  --   --  3.0 4.0  --  3.8  HGB 11.1* 11.0* 10.5* 9.6* 10.1* 10.6*  HCT 32.6* 31.0* 29.8* 27.7* 28.4* 30.5*  MCV 104.2* 98.7 101.0* 101.8*  --  101.7*  PLT 116* 116* 126* 125*  --  157   CBG:  Recent Labs Lab 03/26/13 1225 03/26/13 1722 03/26/13 2225 03/27/13 0744 03/27/13 1204  GLUCAP 154* 152* 172* 135* 95   Microbiology Recent Results (from the past 240 hour(s))  CULTURE, BLOOD (ROUTINE X 2)  Status: None   Collection Time    03/23/13 10:36 AM      Result Value Range Status   Specimen Description BLOOD RIGHT ARM   Final   Special Requests     Final   Value: BOTTLES DRAWN AEROBIC AND ANAEROBIC 4CC BOTH BOTTLES   Culture  Setup Time     Final   Value: 03/23/2013 14:27     Performed at [facility]   Culture     Final   Value:        BLOOD CULTURE RECEIVED NO GROWTH TO DATE CULTURE WILL BE HELD FOR 5 DAYS BEFORE ISSUING A FINAL NEGATIVE REPORT     Performed at [facility]   Report Status PENDING   Incomplete  CULTURE, BLOOD (ROUTINE X 2)     Status: None   Collection Time    03/23/13 10:45 AM      Result Value Range Status   Specimen Description BLOOD RIGHT HAND   Final   Special Requests     Final   Value: BOTTLES DRAWN AEROBIC AND ANAEROBIC 3CC BOTH BOTTLES   Culture  Setup Time     Final   Value: 03/23/2013 14:26     Performed at [facility]   Culture     Final   Value:        BLOOD CULTURE RECEIVED NO GROWTH TO DATE CULTURE WILL BE HELD FOR 5 DAYS BEFORE ISSUING A FINAL NEGATIVE REPORT     Performed at [facility]   Report Status PENDING   Incomplete  CULTURE, EXPECTORATED SPUTUM-ASSESSMENT     Status: None    Collection Time    03/27/13 12:42 PM      Result Value Range Status   Specimen Description SPUTUM   Final   Special Requests NONE   Final   Sputum evaluation     Final   Value: THIS SPECIMEN IS ACCEPTABLE. RESPIRATORY CULTURE REPORT TO FOLLOW.   Report Status 03/27/2013 FINAL   Final   Studies:  CHEST 2 VIEW 03/23/2013 COMPARISON: Chest radiograph performed 09/05/2012, and CT of the  chest performed 07/25/2012  FINDINGS:  The lungs are hypoexpanded. Left basilar airspace opacification  raises concern for pneumonia. No definite pleural effusion or  pneumothorax is seen.  Cardiomediastinal silhouette is borderline normal in size. The  patient is status post median sternotomy. Calcification is noted  within the aortic arch. No acute osseous abnormalities are seen. The  patient is status post vertebroplasty at multiple levels.  IMPRESSION:  Lungs hypoexpanded; left basilar airspace opacification raises  concern for pneumonia.  Assessment: 79 y.o. 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  She is awaiting discharge to SNF.   1. Multiple myeloma, IgA kappa monoclonal gammopathy. .  --She continues to do well overall from a multiple myeloma standpoint.  We should hold her cytoxan, decadron and velcade doing her acute recovering from # 2.  We will cancel her chemotherapy appointment for 11/18 and she should follow up with [place] on her scheduled appointment on 12/03.     2. Fall, mechanical/ LLL PNA. --Abx and incentive spirometry per primary team.  -- Agree with primary team.  Discharge to SNF.   Daughter is also agreeable.   3. Anemia likely secondary to #1, chemotherapy  Vitamin B12 deficiency. Given vitamin B12 IM last visit.  Continue q monthly.  Aranesp if hemoglobin is less than  10.   4. Disposition. Patient is full code.   Her daughter [name] can be contacted at [phone].  She is aware of her progress and agreeable with a plan to discharge to SNF.   We discussed the plan with primary team and her daughter.  Thanks for her taking of Ms. Manson Passey.   Creg Gilmer, MD 03/27/2013  2:59 PM

## 2013-03-27 NOTE — Progress Notes (Signed)
SLP Cancellation Note  Patient Details Name: Christina Hopkins MRN: [id number] DOB: [birth year]   Cancelled treatment:       Reason Eval/Treat Not Completed: Other (comment) (pt on bedside commode, SLP to return next date.)  Donavan Burnet, [place] [facility] SLP [phone]

## 2013-03-27 NOTE — Progress Notes (Signed)
Echocardiogram 2D Echocardiogram has been performed.  Dorothey Baseman 03/27/2013, 10:52 AM

## 2013-03-28 LAB — CBC WITH DIFFERENTIAL/PLATELET
Basophils Absolute: 0 10*3/uL (ref 0.0–0.1)
Basophils Relative: 0 % (ref 0–1)
Eosinophils Relative: 1 % (ref 0–5)
HCT: 27.6 % — ABNORMAL LOW (ref 36.0–46.0)
Lymphocytes Relative: 15 % (ref 12–46)
MCV: 101.5 fL — ABNORMAL HIGH (ref 78.0–100.0)
Monocytes Absolute: 0.6 10*3/uL (ref 0.1–1.0)
Neutrophils Relative %: 68 % (ref 43–77)
Platelets: 154 10*3/uL (ref 150–400)
RBC: 2.72 MIL/uL — ABNORMAL LOW (ref 3.87–5.11)
RDW: 13.1 % (ref 11.5–15.5)
WBC: 3.5 10*3/uL — ABNORMAL LOW (ref 4.0–10.5)

## 2013-03-28 LAB — COMPREHENSIVE METABOLIC PANEL
Albumin: 2.7 g/dL — ABNORMAL LOW (ref 3.5–5.2)
Alkaline Phosphatase: 43 U/L (ref 39–117)
BUN: 13 mg/dL (ref 6–23)
CO2: 28 mEq/L (ref 19–32)
Calcium: 9.4 mg/dL (ref 8.4–10.5)
Chloride: 95 mEq/L — ABNORMAL LOW (ref 96–112)
Creatinine, Ser: 0.62 mg/dL (ref 0.50–1.10)
GFR calc Af Amer: >90 mL/min (ref >90)
GFR calc non Af Amer: 80 mL/min — ABNORMAL LOW (ref >90)
Glucose, Bld: 138 mg/dL — ABNORMAL HIGH (ref 70–99)
Potassium: 4.1 mEq/L (ref 3.5–5.1)
Total Bilirubin: 0.4 mg/dL (ref 0.3–1.2)

## 2013-03-28 LAB — GLUCOSE, CAPILLARY: Glucose-Capillary: 126 mg/dL — ABNORMAL HIGH (ref 70–99)

## 2013-03-28 LAB — CLOSTRIDIUM DIFFICILE BY PCR: Toxigenic C. Difficile by PCR: NEGATIVE

## 2013-03-28 LAB — MAGNESIUM: Magnesium: 1.5 mg/dL (ref 1.5–2.5)

## 2013-03-28 MED ORDER — CEFPODOXIME PROXETIL 200 MG PO TABS: 200.0000 mg | ORAL_TABLET | Freq: Two times a day (BID) | ORAL | Status: DC

## 2013-03-28 MED ORDER — AZITHROMYCIN 500 MG PO TABS: 500.0000 mg | ORAL_TABLET | Freq: Every day | ORAL | Status: DC

## 2013-03-28 MED ORDER — HYDROCODONE-ACETAMINOPHEN 10-325 MG PO TABS: 1.0000 | ORAL_TABLET | ORAL | Status: DC | PRN

## 2013-03-28 NOTE — Progress Notes (Signed)
Physical Therapy Treatment Patient Details Name: Christina Hopkins MRN: [id number] DOB: Feb 10, 1926 Today's Date: 03/28/2013 Time: 2841-3244 PT Time Calculation (min): 28 min  PT Assessment / Plan / Recommendation  History of Present Illness fall w/ L rib Fx's   PT Comments   Assisted pt OOB to amb in hallway when pt had loose stools.  Assisted to BR.  Assisted back to bed.   Follow Up Recommendations  SNF ([facility])     Does the patient have the potential to tolerate intense rehabilitation     Barriers to Discharge        Equipment Recommendations       Recommendations for Other Services    Frequency Min 3X/week   Progress towards PT Goals Progress towards PT goals: Progressing toward goals  Plan      Precautions / Restrictions Precautions Precautions: Fall Precaution Comments: L side rib fractures Restrictions Weight Bearing Restrictions: No   Pertinent Vitals/Pain C/o 8/10 L rib pain meds requested    Mobility  Bed Mobility Bed Mobility: Supine to Sit;Sit to Supine Supine to Sit: HOB elevated;3: Mod assist Sit to Supine: 2: Max assist Details for Bed Mobility Assistance: increased time and assist to rise due to L side rib pain. Transfers Transfers: Sit to Stand;Stand to Sit Sit to Stand: 3: Mod assist;From bed;From toilet;2: Max assist Stand to Sit: 3: Mod assist;To toilet;To chair/3-in-1;2: Max assist Details for Transfer Assistance: 50% VC's on proper hand placement and turn completion prior to sit Ambulation/Gait Ambulation/Gait Assistance: 3: Mod assist;2: Max assist Ambulation Distance (Feet): 52 Feet Assistive device: Rolling walker Ambulation/Gait Assistance Details: increased time and 25% VC's on upright posture.  Amb in hallway then to bathroom.  Gait Pattern: Step-to pattern;Step-through pattern;Trunk flexed Gait velocity: decreased General Gait Details: HIGH FALL RISK      PT Goals (current goals can now be found in the care plan section)     Visit Information  Last PT Received On: 03/28/13 Assistance Needed: +2 History of Present Illness: fall w/ L rib Fx's    Subjective Data      Cognition       Balance     End of Session PT - End of Session Equipment Utilized During Treatment: Gait belt Activity Tolerance: Patient limited by pain;Patient limited by fatigue Patient left: in bed;with call bell/phone within reach   Felecia Shelling  PTA [facility]  Acute  Rehab Pager      [phone]

## 2013-03-28 NOTE — Progress Notes (Signed)
Patient is set to discharge to [facility] SNF today. Patient aware, CSW left message for daughter [name]. Discharge packet given to RN, Irving Burton. [facility] called for transport.   Clinical Social Work Department CLINICAL SOCIAL WORK PLACEMENT NOTE 03/28/2013  Patient:  Christina Hopkins, Christina Hopkins  Account Number:  [id number] Admit date:  03/23/2013  Clinical Social Worker:  Orpah Greek  Date/time:  03/25/2013 10:56 AM  Clinical Social Work is seeking post-discharge placement for this patient at the following level of care:   SKILLED NURSING   (*CSW will update this form in Epic as items are completed)   03/25/2013  Patient/family provided with [facility] Department of Clinical Social Work's list of facilities offering this level of care within the geographic area requested by the patient (or if unable, by the patient's family).  03/25/2013  Patient/family informed of their freedom to choose among providers that offer the needed level of care, that participate in Medicare, Medicaid or managed care program needed by the patient, have an available bed and are willing to accept the patient.  03/25/2013  Patient/family informed of [facility]' ownership interest in [facility], as well as of the fact that they are under no obligation to receive care at this facility.  PASARR submitted to EDS on 03/25/2013 PASARR number received from EDS on 03/25/2013  FL2 transmitted to all facilities in geographic area requested by pt/family on  03/25/2013 FL2 transmitted to all facilities within larger geographic area on   Patient informed that his/her managed care company has contracts with or will negotiate with  certain facilities, including the following:     Patient/family informed of bed offers received:  03/26/2013 Patient chooses bed at [facility] [place] Physician recommends and patient chooses bed at    Patient to be transferred to [facility],  [facility] on  03/28/2013 Patient to be transferred to facility by [facility]  The following physician request were entered in Epic:   Additional Comments:   Unice Bailey, LCSW [facility] Clinical Social Worker cell #: [phone]

## 2013-03-28 NOTE — Discharge Summary (Signed)
Physician Discharge Summary  Christina Hopkins:[id number] DOB: [birth year] DOA: 03/23/2013  PCP: Johny Blamer, MD  Admit date: 03/23/2013 Discharge date: 03/28/2013  Time spent: 35 minutes  Recommendations for Outpatient Follow-up:  1. Follow up with PCP in 1-2 weeks 2. Follow up with Dr. Rosie Fate as scheduled on 04/17/13  Discharge Diagnoses:  Principal Problem:   Fall Active Problems:   Multiple myeloma   Anemia, iron deficiency   Magnesium deficiency   Diabetes mellitus   Hypertension   Coronary artery disease   Fracture of multiple ribs   PNA (pneumonia)   Thrombocytopenia, unspecified   Hyponatremia   Moderate protein-calorie malnutrition   Hypomagnesemia   Discharge Condition: Improved  Diet recommendation: Regular  Filed Weights   03/23/13 0926 03/27/13 0326 03/28/13 0341  Weight: 46.2 kg (101 lb 13.6 oz) 46.902 kg (103 lb 6.4 oz) 44.18 kg (97 lb 6.4 oz)    History of present illness:  Pt is 79 yo female with HTN, DM, who presented to [facility] ED with main concern of bilateral chest area discomfort after an episode of fall she has sustained at home earlier this AM prior to this admission. She explains she got up this AM and went to bathroom, tried to reach her gown and she fell. She currently reports pain is persistent and sharp, 5/10 in severity and non radiating, worse with movement and deep breath, no specific alleviating factors. She denies recent sickness or hospitalizations, loves at home with daughter, she denies chest pain or shortness of breath, she has noticed productive cough of yellow sputum over the past several days with subjective fevers and chills. She denies similar event in the past, no focal neurological symptoms, no headaches or visual changes, no abdominal or urinary concerns.  In ED, pt found to be hemodynamically stable but on CT chest, left sided rib fracture noted and worrisome findings for PNA. TRH asked to admit to telemetry bed for further evaluation  and management.   Hospital Course:  Fall, mechanical  - likely mechanical with subsequent ribs' fracture  - continued with supportive care with analgesia PRN  -PT evaluation; recommended SNF placement  Magnesium deficiency  - Initially severe and was given 4 gm of Mg via IV 03/23/2013  Fracture of multiple ribs status post mechanical fall(Subacute/ healing fractures of the left 7th, 8th, and 9th ribs)  - supportive care, analgesia as needed  HCAP  - given Multiple Myeloma and active chemotherapy, have treated this as HCAP  - See antibiotic trend below  -Started DuoNeb 4 times a day  -Mucinex twice a day  - follow up legionella negative  - strep pneumoniae is negative  -Obtain ambulatory SpO2  Multiple myeloma  - Spoke Dr. Rosie Fate 11/11 and will hold all chemotherapy agents  Anemia of chronic disease  - likely combination of anemia of chronic disease due to multiple myeloma and sequela of chemotherapy as well as iron deficiency anemia  Thrombocytopenia  - secondary to multiple myeloma, sequela of chemotherapy and possibly Lovenox. Stable  - d/c Lovenox and use SCD for DVT prophylaxis  Hyponatremia (asymptomatic)  - stable  - Will check urine and serum osm, urine sodium  Diabetes mellitus  - A1c at target range on this admission  - DC'd Metformin and controlled with sliding scale insulin  Coronary artery disease  - continue Aspirin and Coreg  Hypertension  - reasonable BP on admission  - Continue Lisinopril, Coreg, Spironolactone  -Obtain echocardiogram (none available in Epic); proBNP 03/23/2013 = 2180  Moderate protein calorie malnutrition  - nutrition consulted; per nutrition at 106% of ideal body weight  Oxygen desaturation on ambulation  - SpO2 = 93% on RA  - Continue O2 when necessary to maintain SpO2 > 93%  -During waking hours incentive spirometry  Hypomagnesium  -Replete, goal of> 2  Antibiotic trend: Cefepime 03/23/2013 -->03/27/13  Vancomycin 03/23/2013 -->03/27/13   Acyclovir 03/23/2013 -->>  PO Azithromycin 03/27/13>>>  PO Cefixime 03/27/13>>>  Consultations:  Dr. Rosie Fate  Discharge Exam: Filed Vitals:   03/27/13 1432 03/27/13 1941 03/27/13 2113 03/28/13 0341  BP: 100/56  106/48 136/59  Pulse: 78  73 87  Temp: 98.5 F (36.9 C)  98.4 F (36.9 C) 99 F (37.2 C)  TempSrc: Oral  Oral Oral  Resp: 18  24 20   Height:      Weight:    44.18 kg (97 lb 6.4 oz)  SpO2: 99% 95% 100% 98%    General: Awake, in nad Cardiovascular: regular, s1, s2 Respiratory: normal resp effort, no wheezing  Discharge Instructions   Future Appointments Provider Department Dept Phone   04/03/2013 8:00 AM Sherrie Mustache [facility] [phone]   04/03/2013 8:30 AM [facility] [address] [place] [facility] [phone]   04/17/2013 8:15 AM Delcie Roch [place] [facility] [phone]   04/17/2013 9:00 AM [facility] Covering Provider [address] [place] [facility] [phone]   04/17/2013 10:00 AM [facility] [address] [place] [facility] [phone]       Medication List    ASK your doctor about these medications       acetaminophen 650 MG CR tablet  Commonly known as:  TYLENOL  Take 650 mg by mouth every 8 (eight) hours as needed for pain.     acyclovir 400 MG tablet  Commonly known as:  ZOVIRAX  Take 1 tablet (400 mg total) by mouth 2 (two) times daily.     aspirin EC 81 MG tablet  Take 81 mg by mouth daily.     carvedilol 25 MG tablet  Commonly known as:  COREG  Take 25 mg by mouth 2 (two) times daily with a meal.     cyanocobalamin 1000 MCG/ML injection  Commonly known as:  (VITAMIN B-12)  Inject 1,000 mcg into the muscle every 30 (thirty) days.     cyclobenzaprine 10 MG tablet  Commonly known as:  FLEXERIL  Take 1 tablet (10 mg total) by mouth 3 (three) times daily as needed. For muscle spasm     darbepoetin 300 MCG/0.6ML Soln  injection  Commonly known as:  ARANESP  Inject 300 mcg into the skin as needed (given if needed for anemia).     HYDROcodone-acetaminophen 10-325 MG per tablet  Commonly known as:  NORCO  Take 1 tablet by mouth every 6 (six) hours as needed for pain.     lisinopril 2.5 MG tablet  Commonly known as:  PRINIVIL,ZESTRIL  Take 2.5 mg by mouth every other day.     magic mouthwash Soln  Swish and spit 5 mLs 4 (four) times daily as needed (for mouth ulcers).     metFORMIN 500 MG tablet  Commonly known as:  GLUCOPHAGE  Take 500 mg by mouth 2 (two) times daily with a meal.     multivitamin with minerals Tabs tablet  Take 1 tablet by mouth daily.     pantoprazole 40 MG tablet  Commonly known as:  PROTONIX  Take 40  mg by mouth daily.     PRESCRIPTION MEDICATION  Inject into the vein every 7 (seven) days. Velcade and Cytoxan infusion q7d on Wednesdays.     PREVIDENT DT  Place onto teeth at bedtime.     prochlorperazine 10 MG tablet  Commonly known as:  COMPAZINE  Take 1 tablet (10 mg total) by mouth every 6 (six) hours as needed. For nausea     spironolactone 25 MG tablet  Commonly known as:  ALDACTONE  Take 25 mg by mouth every other day.     SYSTANE OP  Place 2 drops into both eyes at bedtime.     Vitamin D 2000 UNITS tablet  Take 2,000 Units by mouth at bedtime.     Vitamin D-3 5000 UNITS Tabs  Take 1 tablet by mouth at bedtime.       Allergies  Allergen Reactions  . Gemfibrozil Nausea And Vomiting  . Nifedipine Hives  . Questran [Cholestyramine] Other (See Comments)    Do not remember      The results of significant diagnostics from this hospitalization (including imaging, microbiology, ancillary and laboratory) are listed below for reference.    Significant Diagnostic Studies: Dg Chest 2 View  03/23/2013   CLINICAL DATA:  Cough and congestion; status post fall. Shortness of breath.  EXAM: CHEST  2 VIEW  COMPARISON:  Chest radiograph performed 09/05/2012, and  CT of the chest performed 07/25/2012  FINDINGS: The lungs are hypoexpanded. Left basilar airspace opacification raises concern for pneumonia. No definite pleural effusion or pneumothorax is seen.  Cardiomediastinal silhouette is borderline normal in size. The patient is status post median sternotomy. Calcification is noted within the aortic arch. No acute osseous abnormalities are seen. The patient is status post vertebroplasty at multiple levels.  IMPRESSION: Lungs hypoexpanded; left basilar airspace opacification raises concern for pneumonia.   Electronically Signed   By: Roanna Raider M.D.   On: 03/23/2013 03:46   Ct Chest W Contrast  03/23/2013   CLINICAL DATA:  Fall, with left-sided chest pain  EXAM: CT CHEST WITH CONTRAST  TECHNIQUE: Multidetector CT imaging of the chest was performed during intravenous contrast administration.  CONTRAST:  80mL OMNIPAQUE IOHEXOL 300 MG/ML  SOLN  COMPARISON:  Radiograph performed earlier on the same day as well as prior CT from 07/25/2012  FINDINGS: No pathologically enlarged mediastinal, hilar, or axillary lymph nodes are identified.  Median sternotomy wires are in place. Sequelae of prior CABG are noted. There is marked to cardiomegaly with diffuse 3 vessel coronary artery calcifications. Scattered calcified atheromatous disease is also seen within the aortic arch and proximal great vessels. No mediastinal hematoma. No pericardial effusion.  There is no pneumothorax. Bibasilar opacities most likely reflect atelectasis, although possible infection is not entirely excluded. There is no pleural effusion or hemothorax.  Visualized upper abdomen is unremarkable. Large hiatal hernia is present.  Subacute/ healing fractures of the left 7th, 8th, and 9th ribs are present (series 2, image 40). There are acute minimally displaced fractures of the left posterior 10th and 11th ribs. No right-sided rib fracture. The scapulae are intact. Clavicles are intact. Multilevel compression  deformities within the visualized spine are unchanged. Sequelae of prior vertebral augmentation are again noted, unchanged.  IMPRESSION: 1. Acute minimally displaced fractures of the left posterior 10th and 11th ribs. 2. Subacute healing fractures of the left lateral 7th, 8th, and 9th ribs. 3. Bibasilar opacities, left greater than right. Atelectasis is favored, although superimposed infection is not excluded. 4. Cardiomegaly  with diffuse 3 vessel coronary artery calcifications. 5. Large hiatal hernia 6. Diffuse osteopenia.   Electronically Signed   By: Rise Mu M.D.   On: 03/23/2013 05:27    Microbiology: Recent Results (from the past 240 hour(s))  CULTURE, BLOOD (ROUTINE X 2)     Status: None   Collection Time    03/23/13 10:36 AM      Result Value Range Status   Specimen Description BLOOD RIGHT ARM   Final   Special Requests     Final   Value: BOTTLES DRAWN AEROBIC AND ANAEROBIC 4CC BOTH BOTTLES   Culture  Setup Time     Final   Value: 03/23/2013 14:27     Performed at [facility]   Culture     Final   Value:        BLOOD CULTURE RECEIVED NO GROWTH TO DATE CULTURE WILL BE HELD FOR 5 DAYS BEFORE ISSUING A FINAL NEGATIVE REPORT     Performed at [facility]   Report Status PENDING   Incomplete  CULTURE, BLOOD (ROUTINE X 2)     Status: None   Collection Time    03/23/13 10:45 AM      Result Value Range Status   Specimen Description BLOOD RIGHT HAND   Final   Special Requests     Final   Value: BOTTLES DRAWN AEROBIC AND ANAEROBIC 3CC BOTH BOTTLES   Culture  Setup Time     Final   Value: 03/23/2013 14:26     Performed at [facility]   Culture     Final   Value:        BLOOD CULTURE RECEIVED NO GROWTH TO DATE CULTURE WILL BE HELD FOR 5 DAYS BEFORE ISSUING A FINAL NEGATIVE REPORT     Performed at [facility]   Report Status PENDING   Incomplete  CULTURE, EXPECTORATED SPUTUM-ASSESSMENT     Status: None   Collection Time    03/27/13 12:42 PM       Result Value Range Status   Specimen Description SPUTUM   Final   Special Requests NONE   Final   Sputum evaluation     Final   Value: THIS SPECIMEN IS ACCEPTABLE. RESPIRATORY CULTURE REPORT TO FOLLOW.   Report Status 03/27/2013 FINAL   Final     Labs: Basic Metabolic Panel:  Recent Labs Lab 03/23/13 0240  03/23/13 1036 03/24/13 0506 03/25/13 0857 03/26/13 0412 03/26/13 1112 03/27/13 0433 03/28/13 0515  NA 131*  --   --  126* 130* 127* 127* 127* 131*  K 3.8  --   --  3.8 3.7 3.6  --  3.8 4.1  CL 94*  --   --  91* 93* 91*  --  90* 95*  CO2 26  --   --  22 32 29  --  29 28  GLUCOSE 161*  --   --  141* 126* 162*  --  141* 138*  BUN 11  --   --  13 8 13   --  13 13  CREATININE 0.59  --   --  0.60 0.62 0.75  --  0.65 0.62  CALCIUM 9.9  --   --  9.5 9.3 9.6  --  10.1 9.4  MG  --   < > 0.9* 2.0 1.4* 1.3*  --  1.8 1.5  PHOS  --   --  3.6  --   --   --   --   --   --   < > =  values in this interval not displayed. Liver Function Tests:  Recent Labs Lab 03/25/13 0857 03/26/13 0412 03/27/13 0433 03/28/13 0515  AST 12 11 11 11   ALT 7 8 8 7   ALKPHOS 41 44 49 43  BILITOT 0.7 0.4 0.5 0.4  PROT 6.1 5.7* 6.2 5.9*  ALBUMIN 2.8* 2.7* 2.9* 2.7*   No results found for this basename: LIPASE, AMYLASE,  in the last 168 hours No results found for this basename: AMMONIA,  in the last 168 hours CBC:  Recent Labs Lab 03/24/13 0506 03/25/13 0857 03/26/13 0412 03/26/13 1112 03/27/13 0433 03/28/13 0515  WBC 4.5 4.6 5.7  --  4.8 3.5*  NEUTROABS  --  3.0 4.0  --  3.8 2.4  HGB 11.0* 10.5* 9.6* 10.1* 10.6* 9.7*  HCT 31.0* 29.8* 27.7* 28.4* 30.5* 27.6*  MCV 98.7 101.0* 101.8*  --  101.7* 101.5*  PLT 116* 126* 125*  --  157 154   Cardiac Enzymes: No results found for this basename: CKTOTAL, CKMB, CKMBINDEX, TROPONINI,  in the last 168 hours BNP: BNP (last 3 results)  Recent Labs  03/23/13 1036  PROBNP 2180.0*   CBG:  Recent Labs Lab 03/27/13 0744 03/27/13 1204  03/27/13 1650 03/27/13 2115 03/28/13 0730  GLUCAP 135* 95 152* 124* 126*       Signed:  Zayonna Ayuso K  [facility] 03/28/2013, 8:13 AM

## 2013-03-28 NOTE — Progress Notes (Signed)
Patient had multiple watery stools last night. C. Diff sample was taken. Patient put on [place] precautions.

## 2013-03-28 NOTE — Progress Notes (Signed)
Called report to Greig Castilla, Charity fundraiser at [facility].

## 2013-03-29 ENCOUNTER — Other Ambulatory Visit: Payer: Self-pay | Admitting: *Deleted

## 2013-03-29 DIAGNOSIS — C9000 Multiple myeloma not having achieved remission: Secondary | ICD-10-CM

## 2013-03-29 LAB — CULTURE, BLOOD (ROUTINE X 2)
Culture: NO GROWTH
Culture: NO GROWTH

## 2013-03-29 MED ORDER — HYDROCODONE-ACETAMINOPHEN 10-325 MG PO TABS: ORAL_TABLET | ORAL | Status: DC

## 2013-03-30 ENCOUNTER — Non-Acute Institutional Stay (SKILLED_NURSING_FACILITY): Payer: Medicare Other | Admitting: Internal Medicine

## 2013-03-30 ENCOUNTER — Encounter: Payer: Self-pay | Admitting: Internal Medicine

## 2013-03-30 DIAGNOSIS — E440 Moderate protein-calorie malnutrition: Secondary | ICD-10-CM

## 2013-03-30 DIAGNOSIS — IMO0002 Reserved for concepts with insufficient information to code with codable children: Secondary | ICD-10-CM

## 2013-03-30 DIAGNOSIS — W19XXXD Unspecified fall, subsequent encounter: Secondary | ICD-10-CM

## 2013-03-30 DIAGNOSIS — C9000 Multiple myeloma not having achieved remission: Secondary | ICD-10-CM

## 2013-03-30 DIAGNOSIS — I10 Essential (primary) hypertension: Secondary | ICD-10-CM

## 2013-03-30 DIAGNOSIS — D696 Thrombocytopenia, unspecified: Secondary | ICD-10-CM

## 2013-03-30 DIAGNOSIS — E785 Hyperlipidemia, unspecified: Secondary | ICD-10-CM | POA: Insufficient documentation

## 2013-03-30 DIAGNOSIS — W19XXXA Unspecified fall, initial encounter: Secondary | ICD-10-CM

## 2013-03-30 DIAGNOSIS — I2510 Atherosclerotic heart disease of native coronary artery without angina pectoris: Secondary | ICD-10-CM

## 2013-03-30 DIAGNOSIS — D638 Anemia in other chronic diseases classified elsewhere: Secondary | ICD-10-CM

## 2013-03-30 DIAGNOSIS — S2242XS Multiple fractures of ribs, left side, sequela: Secondary | ICD-10-CM

## 2013-03-30 DIAGNOSIS — J189 Pneumonia, unspecified organism: Secondary | ICD-10-CM

## 2013-03-30 DIAGNOSIS — E119 Type 2 diabetes mellitus without complications: Secondary | ICD-10-CM

## 2013-03-30 LAB — CULTURE, RESPIRATORY

## 2013-03-30 LAB — CULTURE, RESPIRATORY W GRAM STAIN: Culture: NORMAL

## 2013-03-30 NOTE — Assessment & Plan Note (Signed)
Acute fx L post 10th,11th with healing fx L 7,8,9 ; supportive care and analgesia

## 2013-03-30 NOTE — Progress Notes (Signed)
MRN: [id number] Name: Christina Hopkins  Sex: female Age: 79 y.o. DOB: 1925-06-02  PSC #: [name] Facility/Room: 118A Level Of Care: SNF Provider: Merrilee Seashore D Emergency Contacts: Extended Emergency Contact Information Primary Emergency Contact: Cuadras,Mary Address: [address]          [place], [place] [address] [place] Home Phone: [phone] Mobile Phone: [phone] Relation: Daughter Secondary Emergency Contact: Royann Shivers [place] Relation: Son  Code Status:   Allergies: Gemfibrozil; Nifedipine; and Questran  Chief Complaint  Patient presents with  . nursing home admission    HPI: Patient is 79 y.o. female who was being txed for MM who fell and broke multiple ribs at home. Being admitted for rehab and support.  Past Medical History  Diagnosis Date  . Hypertension   . Diabetes mellitus   . Anemia   . Osteoporosis   . Dyslipidemia   . Myocardial infarction   . colon ca dx'd 2003    surg only  . Multiple myeloma dx'd 2009    oral chemo ongoing  . Coronary artery disease   . Shortness of breath   . GERD (gastroesophageal reflux disease)   . Wears glasses     Past Surgical History  Procedure Laterality Date  . Coronary artery bypass graft  08/2001    4 vessel  . Colon surgery  2003  . Tonsillectomy    . Appendectomy    . Abdominal hysterectomy    . Back surgery    . Eye surgery      cataracts  . Direct laryngoscopy with radiaesse injection Left 08/13/2012    Procedure: LEFT VOCAL CORD RADIESSE AUGMENTATION LARYNGOSCOPY ;  Surgeon: Flo Shanks, MD;  Location: [facility];  Service: ENT;  Laterality: Left;      Medication List       This list is accurate as of: 03/30/13  4:47 PM.  Always use your most recent med list.               acetaminophen 650 MG CR tablet  Commonly known as:  TYLENOL  Take 650 mg by mouth every 8 (eight) hours as needed for pain.     acyclovir 400 MG tablet   Commonly known as:  ZOVIRAX  Take 1 tablet (400 mg total) by mouth 2 (two) times daily.     aspirin EC 81 MG tablet  Take 81 mg by mouth daily.     azithromycin 500 MG tablet  Commonly known as:  ZITHROMAX  Take 1 tablet (500 mg total) by mouth daily.     carvedilol 25 MG tablet  Commonly known as:  COREG  Take 25 mg by mouth 2 (two) times daily with a meal.     cefpodoxime 200 MG tablet  Commonly known as:  VANTIN  Take 1 tablet (200 mg total) by mouth every 12 (twelve) hours.     cyanocobalamin 1000 MCG/ML injection  Commonly known as:  (VITAMIN B-12)  Inject 1,000 mcg into the muscle every 30 (thirty) days.     cyclobenzaprine 10 MG tablet  Commonly known as:  FLEXERIL  Take 1 tablet (10 mg total) by mouth 3 (three) times daily as needed. For muscle spasm     darbepoetin 300 MCG/0.6ML Soln injection  Commonly known as:  ARANESP  Inject 300 mcg into the skin as needed (given if needed for anemia).     HYDROcodone-acetaminophen 10-325 MG per tablet  Commonly known as:  NORCO  every 4 (four) hours as needed. Take one tablet by mouth every 6 hours as needed for pain     lisinopril 2.5 MG tablet  Commonly known as:  PRINIVIL,ZESTRIL  Take 2.5 mg by mouth every other day.     magic mouthwash Soln  Swish and spit 5 mLs 4 (four) times daily as needed (for mouth ulcers).     metFORMIN 500 MG tablet  Commonly known as:  GLUCOPHAGE  Take 500 mg by mouth 2 (two) times daily with a meal.     multivitamin with minerals Tabs tablet  Take 1 tablet by mouth daily.     pantoprazole 40 MG tablet  Commonly known as:  PROTONIX  Take 40 mg by mouth daily.     PREVIDENT DT  Place onto teeth at bedtime.     prochlorperazine 10 MG tablet  Commonly known as:  COMPAZINE  Take 1 tablet (10 mg total) by mouth every 6 (six) hours as needed. For nausea     spironolactone 25 MG tablet  Commonly known as:  ALDACTONE  Take 25 mg by mouth every other day.     SYSTANE OP  Place 2  drops into both eyes at bedtime.     Vitamin D 2000 UNITS tablet  Take 2,000 Units by mouth at bedtime.     Vitamin D-3 5000 UNITS Tabs  Take 1 tablet by mouth at bedtime.        Meds ordered this encounter  Medications  . HYDROcodone-acetaminophen (NORCO) 10-325 MG per tablet    Sig: every 4 (four) hours as needed. Take one tablet by mouth every 6 hours as needed for pain    Immunization History  Administered Date(s) Administered  . Influenza-Generic 02/09/2011, 01/27/2012  . Pneumococcal Conjugate 02/22/2005    History  Substance Use Topics  . Smoking status: Never Smoker   . Smokeless tobacco: Never Used  . Alcohol Use: No    Family history is noncontributory    Review of Systems  DATA OBTAINED: from patient, family member; HAS NO C/O, SPECIFICALLY NO CHEST WALL PAIN GENERAL: Feels well no fevers, fatigue, appetite changes SKIN: No itching, rash or wounds EYES: No eye pain, redness, discharge EARS: No earache, tinnitus, change in hearing NOSE: No congestion, drainage or bleeding  MOUTH/THROAT: No mouth or tooth pain, No sore throat, No difficulty chewing or swallowing  RESPIRATORY: No cough, wheezing, SOB CARDIAC: No chest pain, palpitations, lower extremity edema  GI: No abdominal pain, No N/V/D or constipation, No heartburn or reflux  GU: No dysuria, frequency or urgency, or incontinence  MUSCULOSKELETAL: No unrelieved bone/joint pain NEUROLOGIC: No headache, dizziness or focal weakness PSYCHIATRIC: No overt anxiety or sadness. Sleeps well. No behavior issue.   Filed Vitals:   03/30/13 1536  BP: 94/51  Pulse: 76  Temp: 97.6 F (36.4 C)  Resp: 20    Physical Exam  GENERAL APPEARANCE: Alert, conversant. Appropriately groomed. No acute distress.  SKIN: No diaphoresis rash, or wounds HEAD: Normocephalic, atraumatic  EYES: Conjunctiva/lids clear. Pupils round, reactive. EOMs intact.  EARS: External exam WNL, canals clear. Hearing grossly normal.  NOSE:  No deformity or discharge.  MOUTH/THROAT: Lips w/o lesions RESPIRATORY: Breathing is even, unlabored. Lung sounds are clear   CARDIOVASCULAR: Heart RRR no murmurs, rubs or gallops. No peripheral edema. GASTROINTESTINAL: Abdomen is soft, non-tender, not distended w/ normal bowel sounds. No mass, ventral or inguinal hernia. No organomegally GENITOURINARY: Bladder non tender, not distended  MUSCULOSKELETAL: No abnormal joints or musculature  NEUROLOGIC: Oriented X3. Cranial nerves 2-12 grossly intact. Moves all extremities no tremor. PSYCHIATRIC: Mood and affect appropriate to situation, no behavioral issues  Patient Active Problem List   Diagnosis Date Noted  . Hyperlipidemia 03/30/2013  . Hypomagnesemia 03/26/2013  . Thrombocytopenia, unspecified 03/24/2013  . Hyponatremia 03/24/2013  . Moderate protein-calorie malnutrition 03/24/2013  . Fall 03/23/2013  . Fracture of multiple ribs 03/23/2013  . PNA (pneumonia) 03/23/2013  . Magnesium deficiency 04/05/2011  . Diabetes mellitus 04/05/2011  . Hypertension 04/05/2011  . Coronary artery disease 04/05/2011  . Multiple myeloma 04/02/2011  . Anemia of chronic disease 04/02/2011    CBC    Component Value Date/Time   WBC 3.5* 03/28/2013 0515   WBC 5.1 03/20/2013 0805   RBC 2.72* 03/28/2013 0515   RBC 3.07* 03/20/2013 0805   HGB 9.7* 03/28/2013 0515   HGB 10.8* 03/20/2013 0805   HCT 27.6* 03/28/2013 0515   HCT 32.4* 03/20/2013 0805   PLT 154 03/28/2013 0515   PLT 153 03/20/2013 0805   MCV 101.5* 03/28/2013 0515   MCV 105.5* 03/20/2013 0805   LYMPHSABS 0.5* 03/28/2013 0515   LYMPHSABS 0.7* 03/20/2013 0805   MONOABS 0.6 03/28/2013 0515   MONOABS 0.5 03/20/2013 0805   EOSABS 0.1 03/28/2013 0515   EOSABS 0.1 03/20/2013 0805   BASOSABS 0.0 03/28/2013 0515   BASOSABS 0.0 03/20/2013 0805    CMP     Component Value Date/Time   NA 131* 03/28/2013 0515   NA 138 03/20/2013 0805   K 4.1 03/28/2013 0515   K 4.1 03/20/2013 0805   CL 95*  03/28/2013 0515   CL 101 10/18/2012 1051   CO2 28 03/28/2013 0515   CO2 24 03/20/2013 0805   GLUCOSE 138* 03/28/2013 0515   GLUCOSE 182* 03/20/2013 0805   GLUCOSE 153* 10/18/2012 1051   BUN 13 03/28/2013 0515   BUN 9.9 03/20/2013 0805   CREATININE 0.62 03/28/2013 0515   CREATININE 0.7 03/20/2013 0805   CREATININE 0.69 08/11/2009 1145   CALCIUM 9.4 03/28/2013 0515   CALCIUM 10.0 03/20/2013 0805   PROT 5.9* 03/28/2013 0515   PROT 6.4 03/20/2013 0805   ALBUMIN 2.7* 03/28/2013 0515   ALBUMIN 3.4* 03/20/2013 0805   AST 11 03/28/2013 0515   AST 14 03/20/2013 0805   ALT 7 03/28/2013 0515   ALT 9 03/20/2013 0805   ALKPHOS 43 03/28/2013 0515   ALKPHOS 54 03/20/2013 0805   BILITOT 0.4 03/28/2013 0515   BILITOT 0.50 03/20/2013 0805   GFRNONAA 80* 03/28/2013 0515   GFRAA >90 03/28/2013 0515    Assessment and Plan  Fall Mechanical, no prior sx, resulting in rib fx-treat with analgesia  Fracture of multiple ribs Acute fx L post 10th,11th with healing fx L 7,8,9 ; supportive care and analgesia  PNA (pneumonia) Possible infiltrate treated given MM and active chemo with cefepime and vanc, then zithromac and cefixime; O2 to keep sats> 93  Multiple myeloma Dr Rosie Fate, Onc, chose to hold chemotherapy until f/u  Anemia of chronic disease And macrocytic-continue darbepoetin and B12  Thrombocytopenia, unspecified Multi etiology-d/ced Lovenox because of it  Diabetes mellitus Well controlled - HbA1c 6.3-continue present meds  Coronary artery disease Continue ASA and coreg  Hypertension Continue aldactone, coreg, spironolactone  Moderate protein-calorie malnutrition Nutrition support at SNF  Hypomagnesemia Mg 11/13 was 1.5-could replete more; Start MagOxide 400 mg po daily    Margit Hanks, MD

## 2013-03-30 NOTE — Assessment & Plan Note (Addendum)
Mg 11/13 was 1.5-could replete more; Start MagOxide 400 mg po daily

## 2013-03-30 NOTE — Assessment & Plan Note (Signed)
Possible infiltrate treated given MM and active chemo with cefepime and vanc, then zithromac and cefixime; O2 to keep sats> 93

## 2013-03-30 NOTE — Assessment & Plan Note (Signed)
Nutrition support at [facility]

## 2013-03-30 NOTE — Assessment & Plan Note (Signed)
Continue aldactone, coreg, spironolactone

## 2013-03-30 NOTE — Assessment & Plan Note (Signed)
Dr Rosie Fate, Onc, chose to hold chemotherapy until f/u

## 2013-03-30 NOTE — Assessment & Plan Note (Signed)
And macrocytic-continue darbepoetin and B12

## 2013-03-30 NOTE — Assessment & Plan Note (Signed)
Multi etiology-d/ced Lovenox because of it

## 2013-03-30 NOTE — Assessment & Plan Note (Signed)
Well controlled - HbA1c 6.3-continue present meds

## 2013-03-30 NOTE — Assessment & Plan Note (Signed)
Continue ASA and coreg

## 2013-03-30 NOTE — Assessment & Plan Note (Signed)
Mechanical, no prior sx, resulting in rib fx-treat with analgesia

## 2013-04-01 ENCOUNTER — Encounter: Payer: Self-pay | Admitting: Medical Oncology

## 2013-04-01 NOTE — Progress Notes (Signed)
Christina Hopkins daughter called stating that her mother is now at [facility]. Dr. Rosie Fate had told them she will not need chemo this week. She would like for [place] to cancel the appts for the 19 th.

## 2013-04-03 ENCOUNTER — Ambulatory Visit: Payer: Medicare Other

## 2013-04-03 ENCOUNTER — Other Ambulatory Visit: Payer: Medicare Other | Admitting: Lab

## 2013-04-17 ENCOUNTER — Other Ambulatory Visit: Payer: Self-pay | Admitting: Internal Medicine

## 2013-04-17 ENCOUNTER — Non-Acute Institutional Stay (SKILLED_NURSING_FACILITY): Payer: Medicare Other | Admitting: Internal Medicine

## 2013-04-17 ENCOUNTER — Ambulatory Visit (HOSPITAL_BASED_OUTPATIENT_CLINIC_OR_DEPARTMENT_OTHER): Payer: Medicare Other | Admitting: Internal Medicine

## 2013-04-17 ENCOUNTER — Ambulatory Visit: Payer: Medicare Other

## 2013-04-17 ENCOUNTER — Telehealth: Payer: Self-pay | Admitting: Internal Medicine

## 2013-04-17 ENCOUNTER — Other Ambulatory Visit (HOSPITAL_BASED_OUTPATIENT_CLINIC_OR_DEPARTMENT_OTHER): Payer: Medicare Other | Admitting: Lab

## 2013-04-17 VITALS — BP 118/43 | HR 88 | Temp 98.0°F | Resp 18 | Ht <= 58 in | Wt 97.8 lb

## 2013-04-17 DIAGNOSIS — C9000 Multiple myeloma not having achieved remission: Secondary | ICD-10-CM

## 2013-04-17 DIAGNOSIS — D696 Thrombocytopenia, unspecified: Secondary | ICD-10-CM

## 2013-04-17 DIAGNOSIS — E559 Vitamin D deficiency, unspecified: Secondary | ICD-10-CM

## 2013-04-17 DIAGNOSIS — E119 Type 2 diabetes mellitus without complications: Secondary | ICD-10-CM

## 2013-04-17 DIAGNOSIS — S2249XA Multiple fractures of ribs, unspecified side, initial encounter for closed fracture: Secondary | ICD-10-CM

## 2013-04-17 DIAGNOSIS — D638 Anemia in other chronic diseases classified elsewhere: Secondary | ICD-10-CM

## 2013-04-17 DIAGNOSIS — I10 Essential (primary) hypertension: Secondary | ICD-10-CM

## 2013-04-17 DIAGNOSIS — W19XXXA Unspecified fall, initial encounter: Secondary | ICD-10-CM

## 2013-04-17 LAB — CBC WITH DIFFERENTIAL/PLATELET
BASO%: 0.4 % (ref 0.0–2.0)
EOS%: 1.7 % (ref 0.0–7.0)
HCT: 32.5 % — ABNORMAL LOW (ref 34.8–46.6)
LYMPH%: 20 % (ref 14.0–49.7)
MCH: 35.4 pg — ABNORMAL HIGH (ref 25.1–34.0)
MCHC: 34.2 g/dL (ref 31.5–36.0)
MCV: 103.5 fL — ABNORMAL HIGH (ref 79.5–101.0)
MONO#: 0.6 10*3/uL (ref 0.1–0.9)
MONO%: 13.4 % (ref 0.0–14.0)
NEUT%: 64.5 % (ref 38.4–76.8)
Platelets: 140 10*3/uL — ABNORMAL LOW (ref 145–400)
lymph#: 0.9 10*3/uL (ref 0.9–3.3)

## 2013-04-17 NOTE — Progress Notes (Signed)
Patient ID: Christina Hopkins, female   DOB: 1925/12/29, 79 y.o.   MRN: [id number] MRN: [id number] Name: Christina Hopkins  Sex: female Age: 79 y.o. DOB: 1925/05/21  PSC #: Christina Hopkins Facility/Room: 118A Level Of Care: SNF Provider: Bufford Spikes   Allergies: Gemfibrozil; Nifedipine; and Questran  Chief Complaint  Patient presents with  . Discharge Note    d/c home with home health pt, ot, rn, rail at step in home    HPI: Patient is 79 y.o. female who was being txed for multiple myeloma who fell and broke multiple ribs at home. She has completed her short term rehab stay with PT, OT.  She walks independently with a cane, can bathe, dress, groom herself from the wheelchair level per therapy notes.  She has a rail by the one step in her home.  She will require continued home health therapy with PT, OT and initial RN assessment.  Past Medical History  Diagnosis Date  . Hypertension   . Diabetes mellitus   . Anemia   . Osteoporosis   . Dyslipidemia   . Myocardial infarction   . colon ca dx'd 2003    surg only  . Multiple myeloma dx'd 2009    oral chemo ongoing  . Coronary artery disease   . Shortness of breath   . GERD (gastroesophageal reflux disease)   . Wears glasses     Past Surgical History  Procedure Laterality Date  . Coronary artery bypass graft  08/2001    4 vessel  . Colon surgery  2003  . Tonsillectomy    . Appendectomy    . Abdominal hysterectomy    . Back surgery    . Eye surgery      cataracts  . Direct laryngoscopy with radiaesse injection Left 08/13/2012    Procedure: LEFT VOCAL CORD RADIESSE AUGMENTATION LARYNGOSCOPY ;  Surgeon: Flo Shanks, MD;  Location: [facility];  Service: ENT;  Laterality: Left;      Medication List       This list is accurate as of: 04/17/13  9:25 PM.  Always use your most recent med list.               acetaminophen 650 MG CR tablet  Commonly known as:  TYLENOL  Take 650 mg by mouth every 8 (eight) hours as  needed for pain.     acyclovir 400 MG tablet  Commonly known as:  ZOVIRAX  Take 1 tablet (400 mg total) by mouth 2 (two) times daily.     aspirin EC 81 MG tablet  Take 81 mg by mouth daily.     azithromycin 500 MG tablet  Commonly known as:  ZITHROMAX  Take 1 tablet (500 mg total) by mouth daily.     carvedilol 25 MG tablet  Commonly known as:  COREG  Take 25 mg by mouth 2 (two) times daily with a meal.     cefpodoxime 200 MG tablet  Commonly known as:  VANTIN  Take 1 tablet (200 mg total) by mouth every 12 (twelve) hours.     cyanocobalamin 1000 MCG/ML injection  Commonly known as:  (VITAMIN B-12)  Inject 1,000 mcg into the muscle every 30 (thirty) days.     cyclobenzaprine 10 MG tablet  Commonly known as:  FLEXERIL  Take 1 tablet (10 mg total) by mouth 3 (three) times daily as needed. For muscle spasm     darbepoetin 300 MCG/0.6ML Soln injection  Commonly known as:  ARANESP  Inject 300 mcg into the skin as needed (given if needed for anemia).     HYDROcodone-acetaminophen 10-325 MG per tablet  Commonly known as:  NORCO  every 4 (four) hours as needed. Take one tablet by mouth every 6 hours as needed for pain     lisinopril 2.5 MG tablet  Commonly known as:  PRINIVIL,ZESTRIL  Take 2.5 mg by mouth every other day.     magic mouthwash Soln  Swish and spit 5 mLs 4 (four) times daily as needed (for mouth ulcers).     metFORMIN 500 MG tablet  Commonly known as:  GLUCOPHAGE  Take 500 mg by mouth 2 (two) times daily with a meal.     multivitamin with minerals Tabs tablet  Take 1 tablet by mouth daily.     pantoprazole 40 MG tablet  Commonly known as:  PROTONIX  Take 40 mg by mouth daily.     PREVIDENT DT  Place onto teeth at bedtime.     prochlorperazine 10 MG tablet  Commonly known as:  COMPAZINE  Take 1 tablet (10 mg total) by mouth every 6 (six) hours as needed. For nausea     spironolactone 25 MG tablet  Commonly known as:  ALDACTONE  Take 25 mg by mouth  every other day.     SYSTANE OP  Place 2 drops into both eyes at bedtime.     Vitamin D 2000 UNITS tablet  Take 2,000 Units by mouth at bedtime.     Vitamin D-3 5000 UNITS Tabs  Take 1 tablet by mouth at bedtime.        No orders of the defined types were placed in this encounter.    Immunization History  Administered Date(s) Administered  . Influenza-Unspecified 02/09/2011, 01/27/2012  . Pneumococcal Conjugate-13 02/22/2005    History  Substance Use Topics  . Smoking status: Never Smoker   . Smokeless tobacco: Never Used  . Alcohol Use: No    Family history is noncontributory    Review of Systems  DATA OBTAINED: from patient, family member; HAS NO C/O, SPECIFICALLY NO CHEST WALL PAIN GENERAL: Feels well no fevers, fatigue, appetite changes SKIN: No itching, rash or wounds EYES: No eye pain, redness, discharge EARS: No earache, tinnitus, change in hearing NOSE: No congestion, drainage or bleeding  MOUTH/THROAT: No mouth or tooth pain, No sore throat, No difficulty chewing or swallowing  RESPIRATORY: No cough, wheezing, SOB CARDIAC: No chest pain, palpitations, lower extremity edema  GI: No abdominal pain, No N/V/D or constipation, No heartburn or reflux  GU: No dysuria, frequency or urgency, or incontinence  MUSCULOSKELETAL: No unrelieved bone/joint pain NEUROLOGIC: No headache, dizziness or focal weakness PSYCHIATRIC: No overt anxiety or sadness. Sleeps well. No behavior issue.     Physical Exam Vitals reviewed GENERAL APPEARANCE: Alert, conversant. Appropriately groomed. No acute distress.  SKIN: No diaphoresis rash, or wounds HEAD: Normocephalic, atraumatic  EYES: Conjunctiva/lids clear. Pupils round, reactive. EOMs intact.  EARS: External exam WNL, canals clear. Hearing grossly normal.  NOSE: No deformity or discharge.  MOUTH/THROAT: Lips w/o lesions RESPIRATORY: Breathing is even, unlabored. Lung sounds are clear   CARDIOVASCULAR: Heart RRR no  murmurs, rubs or gallops. No peripheral edema. GASTROINTESTINAL: Abdomen is soft, non-tender, not distended w/ normal bowel sounds. No mass, ventral or inguinal hernia. No organomegally GENITOURINARY: Bladder non tender, not distended  MUSCULOSKELETAL: No abnormal joints or musculature NEUROLOGIC: Oriented X3. Cranial nerves 2-12 grossly intact. Moves all extremities no tremor. PSYCHIATRIC: Mood  and affect appropriate to situation, no behavioral issues  Patient Active Problem List   Diagnosis Date Noted  . Hyperlipidemia 03/30/2013  . Hypomagnesemia 03/26/2013  . Thrombocytopenia, unspecified 03/24/2013  . Hyponatremia 03/24/2013  . Moderate protein-calorie malnutrition 03/24/2013  . Fall 03/23/2013  . Fracture of multiple ribs 03/23/2013  . PNA (pneumonia) 03/23/2013  . Magnesium deficiency 04/05/2011  . Diabetes mellitus 04/05/2011  . Hypertension 04/05/2011  . Coronary artery disease 04/05/2011  . Multiple myeloma 04/02/2011  . Anemia of chronic disease 04/02/2011    CBC    Component Value Date/Time   WBC 4.7 04/17/2013 0830   WBC 3.5* 03/28/2013 0515   RBC 3.14* 04/17/2013 0830   RBC 2.72* 03/28/2013 0515   HGB 11.1* 04/17/2013 0830   HGB 9.7* 03/28/2013 0515   HCT 32.5* 04/17/2013 0830   HCT 27.6* 03/28/2013 0515   PLT 140* 04/17/2013 0830   PLT 154 03/28/2013 0515   MCV 103.5* 04/17/2013 0830   MCV 101.5* 03/28/2013 0515   LYMPHSABS 0.9 04/17/2013 0830   LYMPHSABS 0.5* 03/28/2013 0515   MONOABS 0.6 04/17/2013 0830   MONOABS 0.6 03/28/2013 0515   EOSABS 0.1 04/17/2013 0830   EOSABS 0.1 03/28/2013 0515   BASOSABS 0.0 04/17/2013 0830   BASOSABS 0.0 03/28/2013 0515    CMP     Component Value Date/Time   NA 131* 03/28/2013 0515   NA 138 03/20/2013 0805   K 4.1 03/28/2013 0515   K 4.1 03/20/2013 0805   CL 95* 03/28/2013 0515   CL 101 10/18/2012 1051   CO2 28 03/28/2013 0515   CO2 24 03/20/2013 0805   GLUCOSE 138* 03/28/2013 0515   GLUCOSE 182* 03/20/2013 0805   GLUCOSE  153* 10/18/2012 1051   BUN 13 03/28/2013 0515   BUN 9.9 03/20/2013 0805   CREATININE 0.62 03/28/2013 0515   CREATININE 0.7 03/20/2013 0805   CREATININE 0.69 08/11/2009 1145   CALCIUM 9.4 03/28/2013 0515   CALCIUM 10.0 03/20/2013 0805   PROT 5.9* 03/28/2013 0515   PROT 6.4 03/20/2013 0805   ALBUMIN 2.7* 03/28/2013 0515   ALBUMIN 3.4* 03/20/2013 0805   AST 11 03/28/2013 0515   AST 14 03/20/2013 0805   ALT 7 03/28/2013 0515   ALT 9 03/20/2013 0805   ALKPHOS 43 03/28/2013 0515   ALKPHOS 54 03/20/2013 0805   BILITOT 0.4 03/28/2013 0515   BILITOT 0.50 03/20/2013 0805   GFRNONAA 80* 03/28/2013 0515   GFRAA >90 03/28/2013 0515    Assessment and Plan 1. Multiple rib fractures -secondary to fall and myeloma -cont hydrocodone for pain   2. Fall -with #1, continue PT, OT at home, use of cane for support and added rail by one step in home  3. Diabetes mellitus -well controlled -cont metformin, monitor renal function and avoid nephrotoxic meds, cont lisinopril, has not tolerated fibrates for triglycerides Lab Results  Component Value Date   HGBA1C 6.3* 03/23/2013    4. Hypertension -cont bp meds--controlled  5. Anemia of chronic disease -due to myeloma and treatments -cont aranesp injections as per hematology  6. Unspecified vitamin D deficiency -cont repletion  D/c home with home health PT, OT, RN, rail at step in home

## 2013-04-17 NOTE — Patient Instructions (Signed)
Fall Prevention and Home Safety °Falls cause injuries and can affect all age groups. It is possible to prevent falls.  °HOW TO PREVENT FALLS °· Wear shoes with rubber soles that do not have an opening for your toes. °· Keep the inside and outside of your house well lit. °· Use night lights throughout your home. °· Remove clutter from floors. °· Clean up floor spills. °· Remove throw rugs or fasten them to the floor with carpet tape. °· Do not place electrical cords across pathways. °· Put grab bars by your tub, shower, and toilet. Do not use towel bars as grab bars. °· Put handrails on both sides of the stairway. Fix loose handrails. °· Do not climb on stools or stepladders, if possible. °· Do not wax your floors. °· Repair uneven or unsafe sidewalks, walkways, or stairs. °· Keep items you use a lot within reach. °· Be aware of pets. °· Keep emergency numbers next to the telephone. °· Put smoke detectors in your home and near bedrooms. °Ask your doctor what other things you can do to prevent falls. °Document Released: 02/26/2009 Document Revised: 11/01/2011 Document Reviewed: 08/02/2011 °ExitCare® Patient Information ©2014 [employer]. ° °

## 2013-04-17 NOTE — Telephone Encounter (Signed)
gv dtr appt schedule for december

## 2013-04-18 NOTE — Progress Notes (Signed)
[facility] OFFICE PROGRESS NOTE  Christina Blamer, MD [address], [address] [place] [address]  DIAGNOSIS: Multiple myeloma - Plan: CBC with Differential, Comprehensive metabolic panel, Immunofixation electrophoresis, Protein electrophoresis, serum, Kappa/lambda light chains  Anemia of chronic disease  Thrombocytopenia, unspecified  Chief Complaint  Patient presents with  . Multiple Myeloma   DIAGNOSIS:  1. Multiple myeloma.  2. Anemia secondary to vitamin B12 deficiency.   PAST THERAPY:  Revlimid (08/2009-08/2012)  Zometa (02/2010 - 02/2012)  Velcade (05/2011 -present)  Cytoxan 08/2012 - present)  Decadron (08/2012 - present)   CURRENT THERAPY:  1. Velcade, Cytoxan and Decadron every 2 weeks from 08/29/2012.  2. Vitamin B12 mg IM monthly.  3. Aranesp 300 mcg subcu every 2 weeks for hemoglobin less than or equal to 10.   (Above is on hold pending recovery.  She is in rehabilitation facility status post recent hospitalization)  Interim History: The patient is a pleasant 79 y.o. female who presented for follow up visit. She was last seen by me on 03/23/2013. She was recently discharge on 03/28/2013 following a 5-day admission secondary to mechanical fall she had at home resulting in left sided rib fractures (7th, 8th and 9th ribs) and findings concerning for PNA.  She was discharge to the [facility] and remains there doing physical rehabiliation.  Today, she reports that she will likely be discharged on 12/05.  Of note, she was released on Thanksgiving to the care of her family and had an additional fall resulting in laceration and bruising to her forehead.  It was witnessed by her daughter and she did not lose consciousness.   It was also felt to be mechanical in nature.  She reports improvement in her cough.  She denies  focal neurological symptoms, headaches or visual changes,  abdominal or urinary  Her appetite and weight is stable.  Patient had double and blurry vision and had successful cataract surgery this past September.  She still  has diarrhea for which he takes Imodium. She reported pain in back,neck and shoulders; numbness in fingers and feet, and dry skin. She reports receiving the flu shot from Dr. Tiburcio Pea' office. She denies any sent hospitalizations or emergency room visits. She has a mild sore throat without fever or change in her voice.   The patient denied fever, chills, night sweats, change in appetite or weight. She denied odynophagia or dysphagia. No chest pain, palpitations, abdominal pain, vomiting,constipation, hematochezia. The patient denied dysuria, nocturia, polyuria, hematuria, myalgia, tingling, psychiatric problems.   MEDICAL HISTORY: Past Medical History  Diagnosis Date  . Hypertension   . Diabetes mellitus   . Anemia   . Osteoporosis   . Dyslipidemia   . Myocardial infarction   . colon ca dx'd 2003    surg only  . Multiple myeloma dx'd 2009    oral chemo ongoing  . Coronary artery disease   . Shortness of breath   . GERD (gastroesophageal reflux disease)   . Wears glasses     INTERIM HISTORY: has Multiple myeloma; Anemia of chronic disease; Magnesium deficiency; Diabetes mellitus; Hypertension; Coronary artery disease; Fall; Fracture of multiple ribs; PNA (pneumonia); Thrombocytopenia, unspecified; Hyponatremia; Moderate protein-calorie malnutrition; Hypomagnesemia; and Hyperlipidemia on her problem list.    ALLERGIES:  is allergic to gemfibrozil; nifedipine; and questran.  MEDICATIONS: has a current medication list which includes the following prescription(s): acetaminophen, acyclovir, magic mouthwash, aspirin ec, azithromycin, carvedilol, cefpodoxime, vitamin d, vitamin d-3, cyanocobalamin, cyclobenzaprine, darbepoetin, hydrocodone-acetaminophen, lisinopril,  metformin, multivitamin with minerals, pantoprazole, polyethyl glycol-propyl glycol, prochlorperazine, sodium fluoride, and  spironolactone, and the following Facility-Administered Medications: darbepoetin.  SURGICAL HISTORY:  Past Surgical History  Procedure Laterality Date  . Coronary artery bypass graft  08/2001    4 vessel  . Colon surgery  2003  . Tonsillectomy    . Appendectomy    . Abdominal hysterectomy    . Back surgery    . Eye surgery      cataracts  . Direct laryngoscopy with radiaesse injection Left 08/13/2012    Procedure: LEFT VOCAL CORD RADIESSE AUGMENTATION LARYNGOSCOPY ;  Surgeon: Flo Shanks, MD;  Location: [facility];  Service: ENT;  Laterality: Left;   Problem List:  1. Multiple myeloma, initially presenting as an IgA kappa monoclonal gammopathy in February 2009. 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 This was felt to be an adverse prognostic determinant. Bone marrow on 08/31/2007 showed 46% plasma cells. A repeat bone marrow on 08/05/2009 showed 73% plasma cells. Treatment with Revlimid was started in April 2011. Zometa was started in October 2011 and 2 years of treatment were concluded on 02/23/2012. We had previously started Aranesp for the patient's anemia in May of 2010. Most recent metastatic bone survey was on 08/05/2009. Other x-rays since then are available. Maximum IgA level was 2080 on 08/11/2009. Subcutaneous Velcade was added to the patient's treatment program on 06/14/2011 because of a rising IgA level 1080 on 06/02/2011. The patient had been receiving Velcade every 2 weeks in combination with Revlimid during the early months of 2014. The patient continues to demonstrate progressive disease as evidenced by rising IgA and serum kappa levels. Revlimid was discontinued in April 2014. The patient had received Revlimid for about 3 years from April 2011 through April 2014. As of 08/29/2012, the patient is now receiving Velcade, Cytoxan and Decadron every 2 weeks. The patient has also been receiving Aranesp 300 mcg subcu every 2 weeks for hemoglobin less than or equal to  10.  2. Anemia secondary to vitamin B12 deficiency. The patient had been on oral vitamin B12. However, her vitamin B12 level fell to 284 on 07/26/2011 and vitamin B12 shots 1000 mcg given IM every month was resumed on 09/06/2011.  3. Anemia secondary to iron deficiency.   REVIEW OF SYSTEMS:   Constitutional: Denies fevers, chills or abnormal weight loss Eyes: Denies blurriness of vision Ears, nose, mouth, throat, and face: Denies mucositis or sore throat Respiratory: Denies cough, dyspnea or wheezes Cardiovascular: Denies palpitation, chest discomfort or lower extremity swelling Gastrointestinal:  Denies nausea, heartburn;  positive for constipation Skin: Denies abnormal skin rashes Lymphatics: Denies new lymphadenopathy or easy bruising Neurological:Denies numbness, tingling or new weaknesses Behavioral/Psych: Mood is stable, no new changes  All other systems were reviewed with the patient and are negative.  PHYSICAL EXAMINATION: ECOG PERFORMANCE STATUS: 1 - Symptomatic but completely ambulatory  Blood pressure 118/43, pulse 88, temperature 98 F (36.7 C), temperature source Oral, resp. rate 18, height 4\' 9"  (1.448 m), weight 97 lb 12.8 oz (44.362 kg).  GENERAL:alert, no distress and comfortable; Diminished hearing; Kyphotic SKIN: skin color, texture, turgor are normal, no rashes or significant lesions; mild dime size bruise over right eyebrow and small laceration on the nose brow.  EYES: normal, Conjunctiva are pink and non-injected, sclera clear OROPHARYNX:no exudate, no erythema and lips, buccal mucosa, and tongue normal  NECK: supple, thyroid normal size, non-tender, without nodularity LYMPH:  no palpable lymphadenopathy in the cervical, axillary or supraclavicular LUNGS: clear  to auscultation and percussion with normal breathing effort HEART: regular rate & rhythm and no murmurs and no lower extremity edema ABDOMEN:abdomen soft, non-tender and normal bowel  sounds Musculoskeletal:no cyanosis of digits and no clubbing  NEURO: alert & oriented x 3 with fluent speech, no focal motor/sensory deficits; cautious gait with a walker.   LABORATORY DATA: Results for orders placed in visit on 04/17/13 (from the past 48 hour(s))  CBC WITH DIFFERENTIAL     Status: Abnormal   Collection Time    04/17/13  8:30 AM      Result Value Range   WBC 4.7  3.9 - 10.3 10e3/uL   NEUT# 3.0  1.5 - 6.5 10e3/uL   HGB 11.1 (*) 11.6 - 15.9 g/dL   HCT 16.1 (*) 09.6 - 04.5 %   Platelets 140 (*) 145 - 400 10e3/uL   MCV 103.5 (*) 79.5 - 101.0 fL   MCH 35.4 (*) 25.1 - 34.0 pg   MCHC 34.2  31.5 - 36.0 g/dL   RBC 4.09 (*) 8.11 - 9.14 10e6/uL   RDW 13.3  11.2 - 14.5 %   lymph# 0.9  0.9 - 3.3 10e3/uL   MONO# 0.6  0.1 - 0.9 10e3/uL   Eosinophils Absolute 0.1  0.0 - 0.5 10e3/uL   Basophils Absolute 0.0  0.0 - 0.1 10e3/uL   NEUT% 64.5  38.4 - 76.8 %   LYMPH% 20.0  14.0 - 49.7 %   MONO% 13.4  0.0 - 14.0 %   EOS% 1.7  0.0 - 7.0 %   BASO% 0.4  0.0 - 2.0 %    Results for VELERIA, BARNHARDT (MRN [id number]) as of 03/20/2013 09:11  Ref. Range 10/18/2012 10:51 11/13/2012 10:56 12/11/2012 08:20 01/08/2013 08:18 02/20/2013 08:38  IgA Latest Range: 69-380 mg/dL 0865 (H) 784 (H) 696 (H) 505 (H) 428 (H)   Lab Results  Component Value Date   WBC 4.7 04/17/2013   HGB 11.1* 04/17/2013   HCT 32.5* 04/17/2013   MCV 103.5* 04/17/2013   PLT 140* 04/17/2013   NEUTROABS 3.0 04/17/2013      Chemistry      Component Value Date/Time   NA 131* 03/28/2013 0515   NA 138 03/20/2013 0805   K 4.1 03/28/2013 0515   K 4.1 03/20/2013 0805   CL 95* 03/28/2013 0515   CL 101 10/18/2012 1051   CO2 28 03/28/2013 0515   CO2 24 03/20/2013 0805   BUN 13 03/28/2013 0515   BUN 9.9 03/20/2013 0805   CREATININE 0.62 03/28/2013 0515   CREATININE 0.7 03/20/2013 0805   CREATININE 0.69 08/11/2009 1145      Component Value Date/Time   CALCIUM 9.4 03/28/2013 0515   CALCIUM 10.0 03/20/2013 0805   ALKPHOS 43 03/28/2013 0515    ALKPHOS 54 03/20/2013 0805   AST 11 03/28/2013 0515   AST 14 03/20/2013 0805   ALT 7 03/28/2013 0515   ALT 9 03/20/2013 0805   BILITOT 0.4 03/28/2013 0515   BILITOT 0.50 03/20/2013 0805     CBC:  Recent Labs Lab 04/17/13 0830  WBC 4.7  NEUTROABS 3.0  HGB 11.1*  HCT 32.5*  MCV 103.5*  PLT 140*   RADIOGRAPHIC STUDIES: METASTATIC BONE SURVEY 11/01/2012 Comparison: 08/05/2009 Correlation: Chest and abdominal radiographs 09/05/2012, CT chest 07/25/2012 Findings: Bones appear demineralized. Extensive scattered atherosclerotic calcifications. Post CABG. Severe multilevel degenerative disc and facet disease changes of the cervicothoracic and lumbar spine. Prior vertebroplasties at T11, T12, L1.  Endplate compression deformities at T6,  T7, and T10 unchanged. Inferior endplate compression deformity L3 new since radiographs 2011. No definite new thoracic compression fracture. Bowel gas projects over the right inferior pubic ramus and obturator foramen question obturator hernia. Bilateral  acromioclavicular and glenohumeral joint degenerative changes with bilateral chronic rotator cuff tears.  Degenerative changes bilateral knees. Hiatal hernia. Callus identified at a probable healing fracture of a lateral lower left rib, new. IMPRESSION: Osseous demineralization with multiple thoracolumbar compression fractures and prior vertebroplasties. Inferior endplate compression deformity L3 new since 2011 though of uncertain age. No definite destructive lytic lesion identified to suggest new multiple myelomatous site. Scattered degenerative changes as above. Small hiatal hernia. Extensive atherosclerotic disease. Question right obturator hernia.   ASSESSMENT: Christina Hopkins 79 y.o. female with a history of Multiple myeloma - Plan: CBC with Differential, Comprehensive metabolic panel, Immunofixation electrophoresis, Protein electrophoresis, serum, Kappa/lambda light chains  Anemia of chronic  disease  Thrombocytopenia, unspecified   PLAN:  1. Multiple myeloma. --She continues to recover from her recent hospitalization.   Her counts appear to have recovered. We will continue to hold her chemotherapy until she is more physically recovered.   -- Her IgA level and serum light chains have improved on current regiment. Her IgA level is 428 down from 505 in 01/08/2013.   --We reviewed her labs and we will hold her chemotherapy with Velcade subcutaneous, Cytoxan 400 mg, IV Decadron 20 mg IV (Day #1, cycle #39). She has been provided a handout explaining common side-effects on prior visits.   2. Leukopenia likely secondary to chemotherapy, resolved.   3. S/p HCAP --Completed antibiotics.  Cough has improved.    4. Multiple rib fractures (L 7th, 8th, 9th) -- Continue Hydrocodone-acetaminophen 10-325 q 4 hours as need.    5. Follow up. RTC in 4 weeks for evaluation for another treatment with CBC, CMET, IgA level, and serum light chains.  All questions were answered. The patient knows to call the clinic with any problems, questions or concerns. We can certainly see the patient much sooner if necessary.  I spent 15 minutes counseling the patient face to face. The total time spent in the appointment was 25 minutes.    Niva Murren, MD 04/18/2013 9:25 AM

## 2013-04-22 DIAGNOSIS — IMO0001 Reserved for inherently not codable concepts without codable children: Secondary | ICD-10-CM

## 2013-04-22 DIAGNOSIS — C9000 Multiple myeloma not having achieved remission: Secondary | ICD-10-CM

## 2013-04-22 DIAGNOSIS — J189 Pneumonia, unspecified organism: Secondary | ICD-10-CM

## 2013-04-22 DIAGNOSIS — D649 Anemia, unspecified: Secondary | ICD-10-CM

## 2013-05-01 ENCOUNTER — Telehealth: Payer: Self-pay | Admitting: *Deleted

## 2013-05-01 ENCOUNTER — Ambulatory Visit (HOSPITAL_BASED_OUTPATIENT_CLINIC_OR_DEPARTMENT_OTHER): Payer: Medicare Other

## 2013-05-01 ENCOUNTER — Other Ambulatory Visit (HOSPITAL_BASED_OUTPATIENT_CLINIC_OR_DEPARTMENT_OTHER): Payer: Medicare Other

## 2013-05-01 ENCOUNTER — Ambulatory Visit (HOSPITAL_BASED_OUTPATIENT_CLINIC_OR_DEPARTMENT_OTHER): Payer: Medicare Other | Admitting: Internal Medicine

## 2013-05-01 ENCOUNTER — Telehealth: Payer: Self-pay | Admitting: Internal Medicine

## 2013-05-01 VITALS — BP 105/64 | HR 88 | Temp 97.9°F | Resp 17 | Ht <= 58 in | Wt 97.0 lb

## 2013-05-01 DIAGNOSIS — C9000 Multiple myeloma not having achieved remission: Secondary | ICD-10-CM

## 2013-05-01 DIAGNOSIS — S2242XS Multiple fractures of ribs, left side, sequela: Secondary | ICD-10-CM

## 2013-05-01 DIAGNOSIS — E440 Moderate protein-calorie malnutrition: Secondary | ICD-10-CM

## 2013-05-01 DIAGNOSIS — D519 Vitamin B12 deficiency anemia, unspecified: Secondary | ICD-10-CM

## 2013-05-01 DIAGNOSIS — W19XXXD Unspecified fall, subsequent encounter: Secondary | ICD-10-CM

## 2013-05-01 DIAGNOSIS — E538 Deficiency of other specified B group vitamins: Secondary | ICD-10-CM

## 2013-05-01 DIAGNOSIS — Z5112 Encounter for antineoplastic immunotherapy: Secondary | ICD-10-CM

## 2013-05-01 DIAGNOSIS — Z5111 Encounter for antineoplastic chemotherapy: Secondary | ICD-10-CM

## 2013-05-01 LAB — CBC WITH DIFFERENTIAL/PLATELET
BASO%: 0.6 % (ref 0.0–2.0)
EOS%: 1.6 % (ref 0.0–7.0)
HCT: 34.6 % — ABNORMAL LOW (ref 34.8–46.6)
MCH: 34.7 pg — ABNORMAL HIGH (ref 25.1–34.0)
MCHC: 34.1 g/dL (ref 31.5–36.0)
MCV: 101.8 fL — ABNORMAL HIGH (ref 79.5–101.0)
MONO%: 10.9 % (ref 0.0–14.0)
RBC: 3.4 10*6/uL — ABNORMAL LOW (ref 3.70–5.45)
RDW: 12.8 % (ref 11.2–14.5)
lymph#: 1.2 10*3/uL (ref 0.9–3.3)
nRBC: 0 % (ref 0–0)

## 2013-05-01 LAB — COMPREHENSIVE METABOLIC PANEL (CC13)
ALT: 6 U/L (ref 0–55)
AST: 16 U/L (ref 5–34)
Albumin: 3.8 g/dL (ref 3.5–5.0)
Alkaline Phosphatase: 68 U/L (ref 40–150)
Potassium: 4.6 mEq/L (ref 3.5–5.1)
Sodium: 134 mEq/L — ABNORMAL LOW (ref 136–145)
Total Bilirubin: 0.48 mg/dL (ref 0.20–1.20)
Total Protein: 6.9 g/dL (ref 6.4–8.3)

## 2013-05-01 MED ORDER — BORTEZOMIB CHEMO SQ INJECTION 3.5 MG (2.5MG/ML)
1.7500 mg | Freq: Once | INTRAMUSCULAR | Status: AC
Start: 2013-05-01 — End: 2013-05-01
  Administered 2013-05-01: 1.75 mg via SUBCUTANEOUS
  Filled 2013-05-01: qty 1.75

## 2013-05-01 MED ORDER — DEXAMETHASONE SODIUM PHOSPHATE 20 MG/5ML IJ SOLN
20.0000 mg | Freq: Once | INTRAMUSCULAR | Status: AC
  Administered 2013-05-01: 20 mg via INTRAVENOUS

## 2013-05-01 MED ORDER — CYANOCOBALAMIN 1000 MCG/ML IJ SOLN
INTRAMUSCULAR | Status: AC
  Filled 2013-05-01: qty 1

## 2013-05-01 MED ORDER — ONDANSETRON 8 MG/50ML IVPB (CHCC)
8.0000 mg | Freq: Once | INTRAVENOUS | Status: AC
  Administered 2013-05-01: 8 mg via INTRAVENOUS

## 2013-05-01 MED ORDER — SODIUM CHLORIDE 0.9 % IV SOLN
Freq: Once | INTRAVENOUS | Status: AC
  Administered 2013-05-01: 12:00:00 via INTRAVENOUS

## 2013-05-01 MED ORDER — DEXAMETHASONE SODIUM PHOSPHATE 20 MG/5ML IJ SOLN
INTRAMUSCULAR | Status: AC
  Filled 2013-05-01: qty 5

## 2013-05-01 MED ORDER — CYANOCOBALAMIN 1000 MCG/ML IJ SOLN
1000.0000 ug | Freq: Once | INTRAMUSCULAR | Status: AC
  Administered 2013-05-01: 1000 ug via INTRAMUSCULAR

## 2013-05-01 MED ORDER — SODIUM CHLORIDE 0.9 % IV SOLN
400.0000 mg | Freq: Once | INTRAVENOUS | Status: AC
  Administered 2013-05-01: 400 mg via INTRAVENOUS
  Filled 2013-05-01: qty 20

## 2013-05-01 MED ORDER — ONDANSETRON 8 MG/NS 50 ML IVPB
INTRAVENOUS | Status: AC
  Filled 2013-05-01: qty 8

## 2013-05-01 NOTE — Telephone Encounter (Signed)
Gave pt appt for lab and MD, emailed Marcelino Duster regarding chemo for December and january 2015

## 2013-05-01 NOTE — Patient Instructions (Addendum)
[facility] Discharge Instructions for Patients Receiving Chemotherapy  Today you received the following chemotherapy agents: Velcade & Cytoxan, B12 injection  To help prevent nausea and vomiting after your treatment, we encourage you to take your nausea medication as prescribed.   If you develop nausea and vomiting that is not controlled by your nausea medication, call the clinic.   BELOW ARE SYMPTOMS THAT SHOULD BE REPORTED IMMEDIATELY:  *FEVER GREATER THAN 100.5 F  *CHILLS WITH OR WITHOUT FEVER  NAUSEA AND VOMITING THAT IS NOT CONTROLLED WITH YOUR NAUSEA MEDICATION  *UNUSUAL SHORTNESS OF BREATH  *UNUSUAL BRUISING OR BLEEDING  TENDERNESS IN MOUTH AND THROAT WITH OR WITHOUT PRESENCE OF ULCERS  *URINARY PROBLEMS  *BOWEL PROBLEMS  UNUSUAL RASH Items with * indicate a potential emergency and should be followed up as soon as possible.  Feel free to call the clinic you have any questions or concerns. The clinic phone number is [phone].

## 2013-05-01 NOTE — Telephone Encounter (Signed)
Per staff message and POF I have scheduled appts.  [name]  

## 2013-05-01 NOTE — Progress Notes (Signed)
[facility] OFFICE PROGRESS NOTE  Christina Blamer, MD [address] [place] [address]  DIAGNOSIS: Moderate protein-calorie malnutrition  Multiple myeloma  Fracture of multiple ribs, left, sequela  Fall, subsequent encounter  Chief Complaint  Patient presents with  . Multiple Myeloma   DIAGNOSIS:  1. Multiple myeloma.  2. Anemia secondary to vitamin B12 deficiency.   PAST THERAPY:  Revlimid (08/2009-08/2012)  Zometa (02/2010 - 02/2012)  Velcade (05/2011 -present)  Cytoxan 08/2012 - present)  Decadron (08/2012 - present)   CURRENT THERAPY:  1. Velcade, Cytoxan and Decadron every 2 weeks from 08/29/2012.  2. Vitamin B12 mg IM monthly.  3. Aranesp 300 mcg subcu every 2 weeks for hemoglobin less than or equal to 10.   Interim History: The patient is a pleasant 79 y.o. female who presented for follow up visit. She was last seen by me on 04/17/2013. She was recently discharged on 03/28/2013 following a 5-day admission secondary to mechanical fall she had at home resulting in left sided rib fractures (7th, 8th and 9th ribs) and findings concerning for PNA.  She was discharged to the [facility] and completed physical rehabilitation on 04/19/2013.  Of note, she was released from rehab on Thanksgiving to the care of her family and had an additional fall resulting in laceration and bruising to her forehead.  It was witnessed by her daughter and she did not lose consciousness.   It was also felt to be mechanical in nature.    She reports improvement in her cough.  She denies  focal neurological symptoms, headaches or visual changes,  abdominal or urinary  Her appetite and weight is stable. Patient had double and blurry vision and had successful cataract surgery this past September.  She still  has diarrhea for which he takes Imodium. She reported pain in back,neck and shoulders; numbness in fingers and feet, and dry skin. She reports  receiving the flu shot from Dr. Tiburcio Pea' office. She denies any recent hospitalizations or emergency room visits.The patient denied fever, chills, night sweats, change in appetite or weight. She denied odynophagia or dysphagia. No chest pain, palpitations, abdominal pain, vomiting, constipation, hematochezia. The patient denied dysuria, nocturia, polyuria, hematuria, myalgia, tingling, psychiatric problems.   MEDICAL HISTORY: Past Medical History  Diagnosis Date  . Hypertension   . Diabetes mellitus   . Anemia   . Osteoporosis   . Dyslipidemia   . Myocardial infarction   . colon ca dx'd 2003    surg only  . Multiple myeloma dx'd 2009    oral chemo ongoing  . Coronary artery disease   . Shortness of breath   . GERD (gastroesophageal reflux disease)   . Wears glasses     INTERIM HISTORY: has Multiple myeloma; Anemia of chronic disease; Magnesium deficiency; Diabetes mellitus; Hypertension; Coronary artery disease; Fall; Fracture of multiple ribs; PNA (pneumonia); Thrombocytopenia, unspecified; Hyponatremia; Moderate protein-calorie malnutrition; Hypomagnesemia; and Hyperlipidemia on her problem list.    ALLERGIES:  is allergic to gemfibrozil; nifedipine; and questran.  MEDICATIONS: has a current medication list which includes the following prescription(s): acetaminophen, acyclovir, magic mouthwash, aspirin ec, azithromycin, carvedilol, cefpodoxime, vitamin d, vitamin d-3, cyanocobalamin, cyclobenzaprine, darbepoetin, hydrocodone-acetaminophen, lisinopril, metformin, multivitamin with minerals, pantoprazole, polyethyl glycol-propyl glycol, prochlorperazine, sodium fluoride, and spironolactone, and the following Facility-Administered Medications: darbepoetin.  SURGICAL HISTORY:  Past Surgical History  Procedure Laterality Date  . Coronary artery bypass graft  08/2001    4 vessel  . Colon surgery  2003  .  Tonsillectomy    . Appendectomy    . Abdominal hysterectomy    . Back surgery    .  Eye surgery      cataracts  . Direct laryngoscopy with radiaesse injection Left 08/13/2012    Procedure: LEFT VOCAL CORD RADIESSE AUGMENTATION LARYNGOSCOPY ;  Surgeon: Flo Shanks, MD;  Location: [facility];  Service: ENT;  Laterality: Left;   Problem List:  1. Multiple myeloma, initially presenting as an IgA kappa monoclonal gammopathy in February 2009. 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 This was felt to be an adverse prognostic determinant. Bone marrow on 08/31/2007 showed 46% plasma cells. A repeat bone marrow on 08/05/2009 showed 73% plasma cells. Treatment with Revlimid was started in April 2011. Zometa was started in October 2011 and 2 years of treatment were concluded on 02/23/2012. We had previously started Aranesp for the patient's anemia in May of 2010. Most recent metastatic bone survey was on 08/05/2009. Other x-rays since then are available. Maximum IgA level was 2080 on 08/11/2009. Subcutaneous Velcade was added to the patient's treatment program on 06/14/2011 because of a rising IgA level 1080 on 06/02/2011. The patient had been receiving Velcade every 2 weeks in combination with Revlimid during the early months of 2014. The patient continues to demonstrate progressive disease as evidenced by rising IgA and serum kappa levels. Revlimid was discontinued in April 2014. The patient had received Revlimid for about 3 years from April 2011 through April 2014. As of 08/29/2012, the patient is now receiving Velcade, Cytoxan and Decadron every 2 weeks. The patient has also been receiving Aranesp 300 mcg subcu every 2 weeks for hemoglobin less than or equal to 10.  2. Anemia secondary to vitamin B12 deficiency. The patient had been on oral vitamin B12. However, her vitamin B12 level fell to 284 on 07/26/2011 and vitamin B12 shots 1000 mcg given IM every month was resumed on 09/06/2011.  3. Anemia secondary to iron deficiency.   REVIEW OF SYSTEMS:   Constitutional: Denies  fevers, chills or abnormal weight loss Eyes: Denies blurriness of vision Ears, nose, mouth, throat, and face: Denies mucositis or sore throat Respiratory: Denies cough, dyspnea or wheezes Cardiovascular: Denies palpitation, chest discomfort or lower extremity swelling Gastrointestinal:  Denies nausea, heartburn;  positive for constipation Skin: Denies abnormal skin rashes Lymphatics: Denies new lymphadenopathy or easy bruising Neurological:Denies numbness, tingling or new weaknesses Behavioral/Psych: Mood is stable, no new changes  All other systems were reviewed with the patient and are negative.  PHYSICAL EXAMINATION: ECOG PERFORMANCE STATUS: 1 - Symptomatic but completely ambulatory  Blood pressure 105/64, pulse 88, temperature 97.9 F (36.6 C), temperature source Oral, resp. rate 17, height 4\' 9"  (1.448 m), weight 97 lb (43.999 kg).  GENERAL:alert, no distress and comfortable; Diminished hearing; Kyphotic SKIN: skin color, texture, turgor are normal, no rashes or significant lesions  EYES: normal, Conjunctiva are pink and non-injected, sclera clear OROPHARYNX:no exudate, no erythema and lips, buccal mucosa, and tongue normal  NECK: supple, thyroid normal size, non-tender, without nodularity LYMPH:  no palpable lymphadenopathy in the cervical, axillary or supraclavicular LUNGS: clear to auscultation and percussion with normal breathing effort HEART: regular rate & rhythm and no murmurs and no lower extremity edema ABDOMEN:abdomen soft, non-tender and normal bowel sounds Musculoskeletal:no cyanosis of digits and no clubbing  NEURO: alert & oriented x 3 with fluent speech, no focal motor/sensory deficits; cautious gait with a walker.    Results for SHEVETTE, BESS (MRN [id number]) as of 03/20/2013  09:11  Ref. Range 10/18/2012 10:51 11/13/2012 10:56 12/11/2012 08:20 01/08/2013 08:18 02/20/2013 08:38  IgA Latest Range: 69-380 mg/dL 1610 (H) 960 (H) 454 (H) 505 (H) 428 (H)   Lab Results   Component Value Date   WBC 4.7 04/17/2013   HGB 11.1* 04/17/2013   HCT 32.5* 04/17/2013   MCV 103.5* 04/17/2013   PLT 140* 04/17/2013   NEUTROABS 3.0 04/17/2013      Chemistry      Component Value Date/Time   NA 131* 03/28/2013 0515   NA 138 03/20/2013 0805   K 4.1 03/28/2013 0515   K 4.1 03/20/2013 0805   CL 95* 03/28/2013 0515   CL 101 10/18/2012 1051   CO2 28 03/28/2013 0515   CO2 24 03/20/2013 0805   BUN 13 03/28/2013 0515   BUN 9.9 03/20/2013 0805   CREATININE 0.62 03/28/2013 0515   CREATININE 0.7 03/20/2013 0805   CREATININE 0.69 08/11/2009 1145      Component Value Date/Time   CALCIUM 9.4 03/28/2013 0515   CALCIUM 10.0 03/20/2013 0805   ALKPHOS 43 03/28/2013 0515   ALKPHOS 54 03/20/2013 0805   AST 11 03/28/2013 0515   AST 14 03/20/2013 0805   ALT 7 03/28/2013 0515   ALT 9 03/20/2013 0805   BILITOT 0.4 03/28/2013 0515   BILITOT 0.50 03/20/2013 0805     RADIOGRAPHIC STUDIES: METASTATIC BONE SURVEY 11/01/2012 Comparison: 08/05/2009 Correlation: Chest and abdominal radiographs 09/05/2012, CT chest 07/25/2012 Findings: Bones appear demineralized. Extensive scattered atherosclerotic calcifications. Post CABG. Severe multilevel degenerative disc and facet disease changes of the cervicothoracic and lumbar spine. Prior vertebroplasties at T11, T12, L1. Endplate compression deformities at T6, T7, and T10 unchanged. Inferior endplate compression deformity L3 new since radiographs 2011. No definite new thoracic compression fracture. Bowel gas projects over the right inferior pubic ramus and obturator foramen question obturator hernia. Bilateral  acromioclavicular and glenohumeral joint degenerative changes with bilateral chronic rotator cuff tears. Degenerative changes bilateral knees. Hiatal hernia. Callus identified at a probable healing fracture of a lateral lower left rib, new. IMPRESSION: Osseous demineralization with multiple thoracolumbar compression fractures and prior vertebroplasties.  Inferior endplate compression deformity L3 new since 2011 though of uncertain age. No definite destructive lytic lesion identified to suggest new multiple myelomatous site. Scattered degenerative changes as above. Small hiatal hernia. Extensive atherosclerotic disease. Question right obturator hernia.   ASSESSMENT: Christina Hopkins 79 y.o. female with a history of Moderate protein-calorie malnutrition  Multiple myeloma  Fracture of multiple ribs, left, sequela  Fall, subsequent encounter   PLAN:  1. Multiple myeloma. --She has recovered from her recent hospitalization and multiple falls as noted in HPI.   Her counts appear to have recovered. We will administer her chemotherapy today.  -- Her IgA level and serum light chains have improved on current regiment. Her IgA level is 449 down from 505 in 01/08/2013.   --We reviewed her labs and proceed with her chemotherapy with Velcade subcutaneous, Cytoxan 400 mg, IV Decadron 20 mg IV (Day #1, cycle #40). She has been provided a handout explaining common side-effects on prior visits.   2. Leukopenia likely secondary to chemotherapy, resolved.    3. Multiple rib fractures (L 7th, 8th, 9th) -- Continue Hydrocodone-acetaminophen 10-325 q 4 hours as need.    4. Follow up. RTC in 4 weeks for evaluation for another treatment with CBC, CMET, IgA level, and serum light chains. She will have her next treatment in 2 weeks with labs prior to this visit.  All questions were answered. The patient knows to call the clinic with any problems, questions or concerns. We can certainly see the patient much sooner if necessary.  I spent 15 minutes counseling the patient face to face. The total time spent in the appointment was 25 minutes.    Christina Leckey, MD 05/01/2013 10:46 AM

## 2013-05-03 LAB — SPEP & IFE WITH QIG
Albumin ELP: 57.6 % (ref 55.8–66.1)
Beta 2: 12.6 % — ABNORMAL HIGH (ref 3.2–6.5)
Beta Globulin: 5.4 % (ref 4.7–7.2)
Gamma Globulin: 6.1 % — ABNORMAL LOW (ref 11.1–18.8)
IgA: 632 mg/dL — ABNORMAL HIGH (ref 69–380)
IgG (Immunoglobin G), Serum: 493 mg/dL — ABNORMAL LOW (ref 690–1700)
IgM, Serum: 6 mg/dL — ABNORMAL LOW (ref 52–322)
M-Spike, %: 0.47 g/dL
Total Protein, Serum Electrophoresis: 6.7 g/dL (ref 6.0–8.3)

## 2013-05-03 LAB — KAPPA/LAMBDA LIGHT CHAINS
Kappa free light chain: 11.1 mg/dL — ABNORMAL HIGH (ref 0.33–1.94)
Lambda Free Lght Chn: 1.53 mg/dL (ref 0.57–2.63)

## 2013-05-06 ENCOUNTER — Telehealth: Payer: Self-pay | Admitting: Internal Medicine

## 2013-05-06 NOTE — Telephone Encounter (Signed)
Talked to pt and gave her appt for 12/31 and January 2015, advised pt to get appt calendar

## 2013-05-15 ENCOUNTER — Ambulatory Visit (HOSPITAL_BASED_OUTPATIENT_CLINIC_OR_DEPARTMENT_OTHER): Payer: Medicare Other

## 2013-05-15 ENCOUNTER — Other Ambulatory Visit (HOSPITAL_BASED_OUTPATIENT_CLINIC_OR_DEPARTMENT_OTHER): Payer: Medicare Other

## 2013-05-15 ENCOUNTER — Telehealth: Payer: Self-pay | Admitting: *Deleted

## 2013-05-15 VITALS — BP 136/76 | HR 72 | Temp 98.1°F | Resp 18

## 2013-05-15 DIAGNOSIS — D649 Anemia, unspecified: Secondary | ICD-10-CM

## 2013-05-15 DIAGNOSIS — C9000 Multiple myeloma not having achieved remission: Secondary | ICD-10-CM

## 2013-05-15 DIAGNOSIS — Z5112 Encounter for antineoplastic immunotherapy: Secondary | ICD-10-CM

## 2013-05-15 DIAGNOSIS — Z5111 Encounter for antineoplastic chemotherapy: Secondary | ICD-10-CM

## 2013-05-15 LAB — CBC WITH DIFFERENTIAL/PLATELET
BASO%: 0.8 % (ref 0.0–2.0)
EOS%: 2.2 % (ref 0.0–7.0)
HGB: 10.6 g/dL — ABNORMAL LOW (ref 11.6–15.9)
LYMPH%: 39.4 % (ref 14.0–49.7)
MCH: 34.6 pg — ABNORMAL HIGH (ref 25.1–34.0)
MCHC: 31.7 g/dL (ref 31.5–36.0)
MCV: 109.2 fL — ABNORMAL HIGH (ref 79.5–101.0)
MONO%: 12.9 % (ref 0.0–14.0)
NEUT#: 1.7 10*3/uL (ref 1.5–6.5)
NEUT%: 44.7 % (ref 38.4–76.8)
Platelets: 202 10*3/uL (ref 145–400)
RBC: 3.06 10*6/uL — ABNORMAL LOW (ref 3.70–5.45)
RDW: 16.1 % — ABNORMAL HIGH (ref 11.2–14.5)
lymph#: 1.5 10*3/uL (ref 0.9–3.3)
nRBC: 0 % (ref 0–0)

## 2013-05-15 LAB — COMPREHENSIVE METABOLIC PANEL (CC13)
ALT: 10 U/L (ref 0–55)
AST: 13 U/L (ref 5–34)
Albumin: 3.3 g/dL — ABNORMAL LOW (ref 3.5–5.0)
Alkaline Phosphatase: 58 U/L (ref 40–150)
CO2: 27 mEq/L (ref 22–29)
Calcium: 9 mg/dL (ref 8.4–10.4)
Glucose: 95 mg/dl (ref 70–140)
Potassium: 4.2 mEq/L (ref 3.5–5.1)
Sodium: 137 mEq/L (ref 136–145)
Total Bilirubin: 0.42 mg/dL (ref 0.20–1.20)
Total Protein: 5.9 g/dL — ABNORMAL LOW (ref 6.4–8.3)

## 2013-05-15 MED ORDER — BORTEZOMIB CHEMO SQ INJECTION 3.5 MG (2.5MG/ML)
1.7500 mg | Freq: Once | INTRAMUSCULAR | Status: AC
  Administered 2013-05-15: 1.75 mg via SUBCUTANEOUS
  Filled 2013-05-15: qty 1.75

## 2013-05-15 MED ORDER — DEXAMETHASONE SODIUM PHOSPHATE 20 MG/5ML IJ SOLN
20.0000 mg | Freq: Once | INTRAMUSCULAR | Status: AC
  Administered 2013-05-15: 20 mg via INTRAVENOUS

## 2013-05-15 MED ORDER — ONDANSETRON 8 MG/50ML IVPB (CHCC)
8.0000 mg | Freq: Once | INTRAVENOUS | Status: AC
  Administered 2013-05-15: 8 mg via INTRAVENOUS

## 2013-05-15 MED ORDER — SODIUM CHLORIDE 0.9 % IV SOLN
400.0000 mg | Freq: Once | INTRAVENOUS | Status: AC
  Administered 2013-05-15: 400 mg via INTRAVENOUS
  Filled 2013-05-15: qty 20

## 2013-05-15 MED ORDER — SODIUM CHLORIDE 0.9 % IV SOLN
INTRAVENOUS | Status: DC
  Administered 2013-05-15: 15:00:00 via INTRAVENOUS

## 2013-05-15 MED ORDER — ONDANSETRON 8 MG/NS 50 ML IVPB
INTRAVENOUS | Status: AC
  Filled 2013-05-15: qty 8

## 2013-05-15 MED ORDER — DEXAMETHASONE SODIUM PHOSPHATE 20 MG/5ML IJ SOLN
INTRAMUSCULAR | Status: AC
  Filled 2013-05-15: qty 5

## 2013-05-15 NOTE — Patient Instructions (Signed)
[facility] Discharge Instructions for Patients Receiving Chemotherapy  Today you received the following chemotherapy agents: Velcade and Cytoxan. You did not receive Aranesp today, your hemoglobin is 10.6.   To help prevent nausea and vomiting after your treatment, we encourage you to take your nausea medication as prescribed. You received Zofran and Decadron in the infusion room prior to chemotherapy.    If you develop nausea and vomiting that is not controlled by your nausea medication, call the clinic.   BELOW ARE SYMPTOMS THAT SHOULD BE REPORTED IMMEDIATELY:  *FEVER GREATER THAN 100.5 F  *CHILLS WITH OR WITHOUT FEVER  NAUSEA AND VOMITING THAT IS NOT CONTROLLED WITH YOUR NAUSEA MEDICATION  *UNUSUAL SHORTNESS OF BREATH  *UNUSUAL BRUISING OR BLEEDING  TENDERNESS IN MOUTH AND THROAT WITH OR WITHOUT PRESENCE OF ULCERS  *URINARY PROBLEMS  *BOWEL PROBLEMS  UNUSUAL RASH Items with * indicate a potential emergency and should be followed up as soon as possible.  Feel free to call the clinic you have any questions or concerns. The clinic phone number is [phone].

## 2013-05-15 NOTE — Telephone Encounter (Signed)
Patient's daughter called regarding the patient. I have called her back. She wanted to give [place] her work and cell phone number because she will not be here with her today. Call her if any concern or problems. I have also moved her lab appt  Closer to her treatment time. [name]

## 2013-05-29 ENCOUNTER — Other Ambulatory Visit (HOSPITAL_BASED_OUTPATIENT_CLINIC_OR_DEPARTMENT_OTHER): Payer: Medicare Other

## 2013-05-29 ENCOUNTER — Other Ambulatory Visit: Payer: Self-pay | Admitting: Internal Medicine

## 2013-05-29 ENCOUNTER — Ambulatory Visit (HOSPITAL_BASED_OUTPATIENT_CLINIC_OR_DEPARTMENT_OTHER): Payer: Medicare Other | Admitting: Internal Medicine

## 2013-05-29 ENCOUNTER — Telehealth: Payer: Self-pay | Admitting: Internal Medicine

## 2013-05-29 ENCOUNTER — Ambulatory Visit (HOSPITAL_BASED_OUTPATIENT_CLINIC_OR_DEPARTMENT_OTHER): Payer: Medicare Other

## 2013-05-29 VITALS — BP 113/44 | HR 70 | Temp 98.5°F | Resp 18 | Ht <= 58 in | Wt 97.9 lb

## 2013-05-29 DIAGNOSIS — M25519 Pain in unspecified shoulder: Secondary | ICD-10-CM

## 2013-05-29 DIAGNOSIS — Z5111 Encounter for antineoplastic chemotherapy: Secondary | ICD-10-CM

## 2013-05-29 DIAGNOSIS — C9000 Multiple myeloma not having achieved remission: Secondary | ICD-10-CM

## 2013-05-29 DIAGNOSIS — M79609 Pain in unspecified limb: Secondary | ICD-10-CM

## 2013-05-29 DIAGNOSIS — Z5112 Encounter for antineoplastic immunotherapy: Secondary | ICD-10-CM

## 2013-05-29 DIAGNOSIS — D72819 Decreased white blood cell count, unspecified: Secondary | ICD-10-CM

## 2013-05-29 DIAGNOSIS — R209 Unspecified disturbances of skin sensation: Secondary | ICD-10-CM

## 2013-05-29 DIAGNOSIS — R197 Diarrhea, unspecified: Secondary | ICD-10-CM

## 2013-05-29 DIAGNOSIS — D518 Other vitamin B12 deficiency anemias: Secondary | ICD-10-CM

## 2013-05-29 DIAGNOSIS — M549 Dorsalgia, unspecified: Secondary | ICD-10-CM

## 2013-05-29 DIAGNOSIS — D519 Vitamin B12 deficiency anemia, unspecified: Secondary | ICD-10-CM

## 2013-05-29 DIAGNOSIS — D638 Anemia in other chronic diseases classified elsewhere: Secondary | ICD-10-CM

## 2013-05-29 LAB — CBC WITH DIFFERENTIAL/PLATELET
BASO%: 1 % (ref 0.0–2.0)
Basophils Absolute: 0 10*3/uL (ref 0.0–0.1)
EOS ABS: 0 10*3/uL (ref 0.0–0.5)
EOS%: 1.4 % (ref 0.0–7.0)
HEMATOCRIT: 32.2 % — AB (ref 34.8–46.6)
HEMOGLOBIN: 10.9 g/dL — AB (ref 11.6–15.9)
LYMPH#: 0.7 10*3/uL — AB (ref 0.9–3.3)
LYMPH%: 23.4 % (ref 14.0–49.7)
MCH: 35 pg — AB (ref 25.1–34.0)
MCHC: 33.8 g/dL (ref 31.5–36.0)
MCV: 103.6 fL — ABNORMAL HIGH (ref 79.5–101.0)
MONO#: 0.4 10*3/uL (ref 0.1–0.9)
MONO%: 13 % (ref 0.0–14.0)
NEUT%: 61.2 % (ref 38.4–76.8)
NEUTROS ABS: 1.9 10*3/uL (ref 1.5–6.5)
Platelets: 189 10*3/uL (ref 145–400)
RBC: 3.1 10*6/uL — ABNORMAL LOW (ref 3.70–5.45)
RDW: 13.5 % (ref 11.2–14.5)
WBC: 3.1 10*3/uL — AB (ref 3.9–10.3)

## 2013-05-29 LAB — COMPREHENSIVE METABOLIC PANEL (CC13)
ALBUMIN: 3.2 g/dL — AB (ref 3.5–5.0)
ALT: 10 U/L (ref 0–55)
ANION GAP: 9 meq/L (ref 3–11)
AST: 14 U/L (ref 5–34)
Alkaline Phosphatase: 65 U/L (ref 40–150)
BUN: 9.7 mg/dL (ref 7.0–26.0)
CHLORIDE: 100 meq/L (ref 98–109)
CO2: 29 meq/L (ref 22–29)
Calcium: 9.8 mg/dL (ref 8.4–10.4)
Creatinine: 0.7 mg/dL (ref 0.6–1.1)
Glucose: 131 mg/dl (ref 70–140)
POTASSIUM: 4.8 meq/L (ref 3.5–5.1)
Sodium: 138 mEq/L (ref 136–145)
Total Bilirubin: 0.32 mg/dL (ref 0.20–1.20)
Total Protein: 6.4 g/dL (ref 6.4–8.3)

## 2013-05-29 MED ORDER — SODIUM CHLORIDE 0.9 % IV SOLN
400.0000 mg | Freq: Once | INTRAVENOUS | Status: AC
  Administered 2013-05-29: 400 mg via INTRAVENOUS
  Filled 2013-05-29: qty 20

## 2013-05-29 MED ORDER — ONDANSETRON 8 MG/NS 50 ML IVPB
INTRAVENOUS | Status: AC
  Filled 2013-05-29: qty 8

## 2013-05-29 MED ORDER — SODIUM CHLORIDE 0.9 % IV SOLN
INTRAVENOUS | Status: DC
  Administered 2013-05-29: 12:00:00 via INTRAVENOUS

## 2013-05-29 MED ORDER — CYANOCOBALAMIN 1000 MCG/ML IJ SOLN
INTRAMUSCULAR | Status: AC
  Filled 2013-05-29: qty 1

## 2013-05-29 MED ORDER — DEXAMETHASONE SODIUM PHOSPHATE 20 MG/5ML IJ SOLN
INTRAMUSCULAR | Status: AC
  Filled 2013-05-29: qty 5

## 2013-05-29 MED ORDER — BORTEZOMIB CHEMO SQ INJECTION 3.5 MG (2.5MG/ML)
1.7500 mg | Freq: Once | INTRAMUSCULAR | Status: AC
  Administered 2013-05-29: 1.75 mg via SUBCUTANEOUS
  Filled 2013-05-29: qty 1.75

## 2013-05-29 MED ORDER — ONDANSETRON 8 MG/50ML IVPB (CHCC)
8.0000 mg | Freq: Once | INTRAVENOUS | Status: AC
  Administered 2013-05-29: 8 mg via INTRAVENOUS

## 2013-05-29 MED ORDER — CYANOCOBALAMIN 1000 MCG/ML IJ SOLN
1000.0000 ug | Freq: Once | INTRAMUSCULAR | Status: AC
  Administered 2013-05-29: 1000 ug via INTRAMUSCULAR

## 2013-05-29 MED ORDER — DEXAMETHASONE SODIUM PHOSPHATE 20 MG/5ML IJ SOLN
20.0000 mg | Freq: Once | INTRAMUSCULAR | Status: AC
  Administered 2013-05-29: 20 mg via INTRAVENOUS

## 2013-05-29 NOTE — Telephone Encounter (Signed)
gv adn printed appt sched and avs for pt for Jan and FEb   sed added tx.

## 2013-05-29 NOTE — Patient Instructions (Signed)
[facility] Discharge Instructions for Patients Receiving Chemotherapy  Today you received the following chemotherapy agents velcade and cytoxan.  To help prevent nausea and vomiting after your treatment, we encourage you to take your nausea medication compazine.   If you develop nausea and vomiting that is not controlled by your nausea medication, call the clinic.   BELOW ARE SYMPTOMS THAT SHOULD BE REPORTED IMMEDIATELY:  *FEVER GREATER THAN 100.5 F  *CHILLS WITH OR WITHOUT FEVER  NAUSEA AND VOMITING THAT IS NOT CONTROLLED WITH YOUR NAUSEA MEDICATION  *UNUSUAL SHORTNESS OF BREATH  *UNUSUAL BRUISING OR BLEEDING  TENDERNESS IN MOUTH AND THROAT WITH OR WITHOUT PRESENCE OF ULCERS  *URINARY PROBLEMS  *BOWEL PROBLEMS  UNUSUAL RASH Items with * indicate a potential emergency and should be followed up as soon as possible.  Feel free to call the clinic you have any questions or concerns. The clinic phone number is [phone].

## 2013-05-30 ENCOUNTER — Other Ambulatory Visit: Payer: Medicare Other

## 2013-05-30 ENCOUNTER — Ambulatory Visit: Payer: Medicare Other

## 2013-05-30 NOTE — Progress Notes (Signed)
[facility] OFFICE PROGRESS NOTE  [name], MD [address], [address] [place] [address]  DIAGNOSIS: Multiple myeloma - Plan: Comprehensive metabolic panel (Cmet) - [facility], Basic metabolic panel (Bmet) - [facility], CBC with Differential, IgA, CBC with Differential, CANCELED: CBC with Differential  Anemia of chronic disease  Chief Complaint  Patient presents with  . Multiple Myeloma   DIAGNOSIS:  1. Multiple myeloma.  2. Anemia secondary to vitamin B12 deficiency.   PAST THERAPY:  Revlimid (08/2009-08/2012)  Zometa (02/2010 - 02/2012)  Velcade (05/2011 -present)  Cytoxan 08/2012 - present)  Decadron (08/2012 - present)   CURRENT THERAPY:  1. Velcade, Cytoxan and Decadron every 2 weeks from 08/29/2012.  2. Vitamin B12 mg IM monthly.  3. Aranesp 300 mcg subcu every 2 weeks for hemoglobin less than or equal to 10.   Interim History: The patient is a pleasant 80 y.o. female who presented for follow up visit. She was last seen by me on 05/01/2013. Today, she is accompanied by her daughter.    She was recently discharged on 03/28/2013 following a 5-day admission secondary to mechanical fall she had at home resulting in left sided rib fractures (7th, 8th and 9th ribs) and findings concerning for PNA.  She was discharged to the [facility] and completed physical rehabilitation on 04/19/2013.  Of note, she was released from rehab on Thanksgiving to the care of her family and had an additional fall resulting in laceration and bruising to her forehead.  It was witnessed by her daughter and she did not lose consciousness.   It was also felt to be mechanical in nature.    Today, she reports ongoing physical therapy without any further falls.  She feels as if her strength has improved. She reports improvement in her cough.  She denies  focal neurological symptoms, headaches or visual changes,  abdominal or urinary Her appetite and weight is stable.  Patient had double and blurry vision and had successful cataract surgery this past September.  She still  has mild diarrhea for which he takes Imodium. She reported pain in back,neck and shoulders; numbness in fingers and feet, and dry skin. She reports receiving the flu shot from Dr. Kenton Kingfisher' office. She denies any recent hospitalizations or emergency room visits.The patient denied fever, chills, night sweats, change in appetite or weight. She denied odynophagia or dysphagia. No chest pain, palpitations, abdominal pain, vomiting, constipation, hematochezia. The patient denied dysuria, nocturia, polyuria, hematuria, myalgia, tingling, psychiatric problems.   MEDICAL HISTORY: Past Medical History  Diagnosis Date  . Hypertension   . Diabetes mellitus   . Anemia   . Osteoporosis   . Dyslipidemia   . Myocardial infarction   . colon ca dx'd 2003    surg only  . Multiple myeloma dx'd 2009    oral chemo ongoing  . Coronary artery disease   . Shortness of breath   . GERD (gastroesophageal reflux disease)   . Wears glasses     INTERIM HISTORY: has Multiple myeloma; Anemia of chronic disease; Magnesium deficiency; Diabetes mellitus; Hypertension; Coronary artery disease; Fall; Fracture of multiple ribs; PNA (pneumonia); Thrombocytopenia, unspecified; Hyponatremia; Moderate protein-calorie malnutrition; Hypomagnesemia; and Hyperlipidemia on her problem list.    ALLERGIES:  is allergic to gemfibrozil; nifedipine; and questran.  MEDICATIONS: has a current medication list which includes the following prescription(s): acetaminophen, acyclovir, magic mouthwash, aspirin ec, carvedilol, vitamin d-3, cyanocobalamin, cyclobenzaprine, darbepoetin, hydrocodone-acetaminophen, lisinopril, loperamide, magnesium oxide, metformin, multivitamin with minerals, pantoprazole, polyethyl  glycol-propyl glycol, prochlorperazine, sodium fluoride, and spironolactone, and the following Facility-Administered Medications:  darbepoetin.  SURGICAL HISTORY:  Past Surgical History  Procedure Laterality Date  . Coronary artery bypass graft  08/2001    4 vessel  . Colon surgery  2003  . Tonsillectomy    . Appendectomy    . Abdominal hysterectomy    . Back surgery    . Eye surgery      cataracts  . Direct laryngoscopy with radiaesse injection Left 08/13/2012    Procedure: LEFT VOCAL CORD RADIESSE AUGMENTATION LARYNGOSCOPY ;  Surgeon: Jodi Marble, MD;  Location: [facility];  Service: ENT;  Laterality: Left;   Problem List:  1. Multiple myeloma, initially presenting as an IgA kappa monoclonal gammopathy in February 2009. 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 This was felt to be an adverse prognostic determinant. Bone marrow on 08/31/2007 showed 46% plasma cells. A repeat bone marrow on 08/05/2009 showed 73% plasma cells. Treatment with Revlimid was started in April 2011. Zometa was started in October 2011 and 2 years of treatment were concluded on 02/23/2012. We had previously started Aranesp for the patient's anemia in May of 2010. Most recent metastatic bone survey was on 08/05/2009. Other x-rays since then are available. Maximum IgA level was 2080 on 08/11/2009. Subcutaneous Velcade was added to the patient's treatment program on 06/14/2011 because of a rising IgA level 1080 on 06/02/2011. The patient had been receiving Velcade every 2 weeks in combination with Revlimid during the early months of 2014. The patient continues to demonstrate progressive disease as evidenced by rising IgA and serum kappa levels. Revlimid was discontinued in April 2014. The patient had received Revlimid for about 3 years from April 2011 through April 2014. As of 08/29/2012, the patient is now receiving Velcade, Cytoxan and Decadron every 2 weeks. The patient has also been receiving Aranesp 300 mcg subcu every 2 weeks for hemoglobin less than or equal to 10.  2. Anemia secondary to vitamin B12 deficiency. The patient had  been on oral vitamin B12. However, her vitamin B12 level fell to 284 on 07/26/2011 and vitamin B12 shots 1000 mcg given IM every month was resumed on 09/06/2011.  3. Anemia secondary to iron deficiency.   REVIEW OF SYSTEMS:   Constitutional: Denies fevers, chills or abnormal weight loss Eyes: Denies blurriness of vision Ears, nose, mouth, throat, and face: Denies mucositis or sore throat Respiratory: Denies cough, dyspnea or wheezes Cardiovascular: Denies palpitation, chest discomfort or lower extremity swelling Gastrointestinal:  Denies nausea, heartburn;  positive for constipation Skin: Denies abnormal skin rashes Lymphatics: Denies new lymphadenopathy or easy bruising Neurological:Denies numbness, tingling or new weaknesses Behavioral/Psych: Mood is stable, no new changes  All other systems were reviewed with the patient and are negative.  PHYSICAL EXAMINATION: ECOG PERFORMANCE STATUS: 1 - Symptomatic but completely ambulatory  Blood pressure 113/44, pulse 70, temperature 98.5 F (36.9 C), temperature source Oral, resp. rate 18, height 4' 9"  (1.448 m), weight 97 lb 14.4 oz (44.407 kg), SpO2 99.00%.  GENERAL:alert, no distress and comfortable; Diminished hearing; Kyphotic SKIN: skin color, texture, turgor are normal, no rashes or significant lesions  EYES: normal, Conjunctiva are pink and non-injected, sclera clear OROPHARYNX:no exudate, no erythema and lips, buccal mucosa, and tongue normal  NECK: supple, thyroid normal size, non-tender, without nodularity LYMPH:  no palpable lymphadenopathy in the cervical, axillary or supraclavicular LUNGS: clear to auscultation and percussion with normal breathing effort HEART: regular rate & rhythm and no murmurs and no  lower extremity edema ABDOMEN:abdomen soft, non-tender and normal bowel sounds Musculoskeletal:no cyanosis of digits and no clubbing  NEURO: alert & oriented x 3 with fluent speech, no focal motor/sensory deficits; cautious gait  with a walker.   Results for SYLEENA, MCHAN (MRN [id number]) as of 05/30/2013 03:25  Ref. Range 12/11/2012 08:20 01/08/2013 08:18 02/20/2013 08:38 03/20/2013 08:05 05/01/2013 10:30  IgA Latest Range: 69-380 mg/dL 553 (H) 505 (H) 428 (H) 449 (H) 632 (H)    Lab Results  Component Value Date   WBC 3.1* 05/29/2013   HGB 10.9* 05/29/2013   HCT 32.2* 05/29/2013   MCV 103.6* 05/29/2013   PLT 189 05/29/2013   NEUTROABS 1.9 05/29/2013      Chemistry      Component Value Date/Time   NA 138 05/29/2013 1043   NA 131* 03/28/2013 0515   K 4.8 05/29/2013 1043   K 4.1 03/28/2013 0515   CL 95* 03/28/2013 0515   CL 101 10/18/2012 1051   CO2 29 05/29/2013 1043   CO2 28 03/28/2013 0515   BUN 9.7 05/29/2013 1043   BUN 13 03/28/2013 0515   CREATININE 0.7 05/29/2013 1043   CREATININE 0.62 03/28/2013 0515   CREATININE 0.69 08/11/2009 1145      Component Value Date/Time   CALCIUM 9.8 05/29/2013 1043   CALCIUM 9.4 03/28/2013 0515   ALKPHOS 65 05/29/2013 1043   ALKPHOS 43 03/28/2013 0515   AST 14 05/29/2013 1043   AST 11 03/28/2013 0515   ALT 10 05/29/2013 1043   ALT 7 03/28/2013 0515   BILITOT 0.32 05/29/2013 1043   BILITOT 0.4 03/28/2013 0515     RADIOGRAPHIC STUDIES: METASTATIC BONE SURVEY 11/01/2012 Comparison: 08/05/2009 Correlation: Chest and abdominal radiographs 09/05/2012, CT chest 07/25/2012 Findings: Bones appear demineralized. Extensive scattered atherosclerotic calcifications. Post CABG. Severe multilevel degenerative disc and facet disease changes of the cervicothoracic and lumbar spine. Prior vertebroplasties at T11, T12, L1. Endplate compression deformities at T6, T7, and T10 unchanged. Inferior endplate compression deformity L3 new since radiographs 2011. No definite new thoracic compression fracture. Bowel gas projects over the right inferior pubic ramus and obturator foramen question obturator hernia. Bilateral  acromioclavicular and glenohumeral joint degenerative changes with bilateral chronic  rotator cuff tears. Degenerative changes bilateral knees. Hiatal hernia. Callus identified at a probable healing fracture of a lateral lower left rib, new. IMPRESSION: Osseous demineralization with multiple thoracolumbar compression fractures and prior vertebroplasties. Inferior endplate compression deformity L3 new since [age] though of uncertain age. No definite destructive lytic lesion identified to suggest new multiple myelomatous site. Scattered degenerative changes as above. Small hiatal hernia. Extensive atherosclerotic disease. Question right obturator hernia.   ASSESSMENT: Christina Hopkins 80 y.o. female with a history of Multiple myeloma - Plan: Comprehensive metabolic panel (Cmet) - [facility], Basic metabolic panel (Bmet) - [facility], CBC with Differential, IgA, CBC with Differential, CANCELED: CBC with Differential  Anemia of chronic disease   PLAN:  1. Multiple myeloma. --She has recovered from her recent hospitalization and multiple falls as noted in HPI.   Her counts appear to have recovered. We will administer her chemotherapy today.  -- Her IgA level and serum light chains have improved on current regiment overall. Her last IgA was 639 up from 449 in November.  We will repeat this with her labs in one month.  --We reviewed her labs and proceed with her chemotherapy with Velcade subcutaneous, Cytoxan 400 mg, IV Decadron 20 mg IV (Day #1, cycle #42). She has been provided a handout  explaining common side-effects on prior visits.   2. Leukopenia likely secondary to chemotherapy, mild.   --Her ANC is 1.9.    3. Multiple rib fractures (L 7th, 8th, 9th) -- Continue Hydrocodone-acetaminophen 10-325 q 4 hours as need.    4. Follow up. RTC in 4 weeks for evaluation for another treatment with CBC, CMET, IgA level, and serum light chains. She will have her next treatment in 2 weeks with labs prior to this visit.   All questions were answered. The patient knows to call the clinic with any problems,  questions or concerns. We can certainly see the patient much sooner if necessary.  I spent 15 minutes counseling the patient face to face. The total time spent in the appointment was 25 minutes.    Clayson Riling, MD 05/30/2013 3:21 AM

## 2013-05-31 ENCOUNTER — Ambulatory Visit: Payer: Medicare Other

## 2013-05-31 LAB — SPEP & IFE WITH QIG
ALBUMIN ELP: 54.3 % — AB (ref 55.8–66.1)
ALPHA-1-GLOBULIN: 6.4 % — AB (ref 2.9–4.9)
ALPHA-2-GLOBULIN: 14.7 % — AB (ref 7.1–11.8)
Beta 2: 13.6 % — ABNORMAL HIGH (ref 3.2–6.5)
Beta Globulin: 5.4 % (ref 4.7–7.2)
Gamma Globulin: 5.6 % — ABNORMAL LOW (ref 11.1–18.8)
IgA: 536 mg/dL — ABNORMAL HIGH (ref 69–380)
IgG (Immunoglobin G), Serum: 409 mg/dL — ABNORMAL LOW (ref 690–1700)
M-Spike, %: 0.41 g/dL
Total Protein, Serum Electrophoresis: 6.2 g/dL (ref 6.0–8.3)

## 2013-05-31 LAB — KAPPA/LAMBDA LIGHT CHAINS
Kappa free light chain: 11.5 mg/dL — ABNORMAL HIGH (ref 0.33–1.94)
Kappa:Lambda Ratio: 8.46 — ABNORMAL HIGH (ref 0.26–1.65)
Lambda Free Lght Chn: 1.36 mg/dL (ref 0.57–2.63)

## 2013-06-06 ENCOUNTER — Emergency Department (HOSPITAL_COMMUNITY): Payer: Medicare Other

## 2013-06-06 ENCOUNTER — Encounter (HOSPITAL_COMMUNITY): Payer: Self-pay | Admitting: Emergency Medicine

## 2013-06-06 ENCOUNTER — Observation Stay (HOSPITAL_COMMUNITY): Payer: Medicare Other

## 2013-06-06 ENCOUNTER — Inpatient Hospital Stay (HOSPITAL_COMMUNITY)
Admission: EM | Admit: 2013-06-06 | Discharge: 2013-06-14 | DRG: 378 | Disposition: A | Discharge status: Home Health | Payer: Medicare Other | Attending: Internal Medicine | Admitting: Internal Medicine

## 2013-06-06 DIAGNOSIS — K439 Ventral hernia without obstruction or gangrene: Secondary | ICD-10-CM

## 2013-06-06 DIAGNOSIS — Z7982 Long term (current) use of aspirin: Secondary | ICD-10-CM

## 2013-06-06 DIAGNOSIS — K449 Diaphragmatic hernia without obstruction or gangrene: Secondary | ICD-10-CM | POA: Diagnosis present

## 2013-06-06 DIAGNOSIS — K219 Gastro-esophageal reflux disease without esophagitis: Secondary | ICD-10-CM | POA: Diagnosis present

## 2013-06-06 DIAGNOSIS — I5042 Chronic combined systolic (congestive) and diastolic (congestive) heart failure: Secondary | ICD-10-CM

## 2013-06-06 DIAGNOSIS — I10 Essential (primary) hypertension: Secondary | ICD-10-CM

## 2013-06-06 DIAGNOSIS — G8929 Other chronic pain: Secondary | ICD-10-CM | POA: Diagnosis present

## 2013-06-06 DIAGNOSIS — Z833 Family history of diabetes mellitus: Secondary | ICD-10-CM

## 2013-06-06 DIAGNOSIS — K5731 Diverticulosis of large intestine without perforation or abscess with bleeding: Principal | ICD-10-CM | POA: Diagnosis present

## 2013-06-06 DIAGNOSIS — S2249XA Multiple fractures of ribs, unspecified side, initial encounter for closed fracture: Secondary | ICD-10-CM

## 2013-06-06 DIAGNOSIS — E871 Hypo-osmolality and hyponatremia: Secondary | ICD-10-CM

## 2013-06-06 DIAGNOSIS — K644 Residual hemorrhoidal skin tags: Secondary | ICD-10-CM | POA: Diagnosis present

## 2013-06-06 DIAGNOSIS — E440 Moderate protein-calorie malnutrition: Secondary | ICD-10-CM | POA: Diagnosis present

## 2013-06-06 DIAGNOSIS — E876 Hypokalemia: Secondary | ICD-10-CM | POA: Insufficient documentation

## 2013-06-06 DIAGNOSIS — E785 Hyperlipidemia, unspecified: Secondary | ICD-10-CM

## 2013-06-06 DIAGNOSIS — K648 Other hemorrhoids: Secondary | ICD-10-CM | POA: Diagnosis present

## 2013-06-06 DIAGNOSIS — R1013 Epigastric pain: Secondary | ICD-10-CM | POA: Diagnosis present

## 2013-06-06 DIAGNOSIS — Z8249 Family history of ischemic heart disease and other diseases of the circulatory system: Secondary | ICD-10-CM

## 2013-06-06 DIAGNOSIS — M810 Age-related osteoporosis without current pathological fracture: Secondary | ICD-10-CM | POA: Diagnosis present

## 2013-06-06 DIAGNOSIS — C9000 Multiple myeloma not having achieved remission: Secondary | ICD-10-CM

## 2013-06-06 DIAGNOSIS — K625 Hemorrhage of anus and rectum: Secondary | ICD-10-CM

## 2013-06-06 DIAGNOSIS — Z85038 Personal history of other malignant neoplasm of large intestine: Secondary | ICD-10-CM | POA: Diagnosis present

## 2013-06-06 DIAGNOSIS — Z9049 Acquired absence of other specified parts of digestive tract: Secondary | ICD-10-CM

## 2013-06-06 DIAGNOSIS — R109 Unspecified abdominal pain: Secondary | ICD-10-CM

## 2013-06-06 DIAGNOSIS — Z66 Do not resuscitate: Secondary | ICD-10-CM | POA: Diagnosis present

## 2013-06-06 DIAGNOSIS — Z951 Presence of aortocoronary bypass graft: Secondary | ICD-10-CM

## 2013-06-06 DIAGNOSIS — K922 Gastrointestinal hemorrhage, unspecified: Secondary | ICD-10-CM

## 2013-06-06 DIAGNOSIS — M549 Dorsalgia, unspecified: Secondary | ICD-10-CM | POA: Diagnosis present

## 2013-06-06 DIAGNOSIS — I700 Atherosclerosis of aorta: Secondary | ICD-10-CM | POA: Diagnosis present

## 2013-06-06 DIAGNOSIS — K432 Incisional hernia without obstruction or gangrene: Secondary | ICD-10-CM | POA: Diagnosis present

## 2013-06-06 DIAGNOSIS — E119 Type 2 diabetes mellitus without complications: Secondary | ICD-10-CM

## 2013-06-06 DIAGNOSIS — J189 Pneumonia, unspecified organism: Secondary | ICD-10-CM

## 2013-06-06 DIAGNOSIS — I252 Old myocardial infarction: Secondary | ICD-10-CM

## 2013-06-06 DIAGNOSIS — I2510 Atherosclerotic heart disease of native coronary artery without angina pectoris: Secondary | ICD-10-CM | POA: Diagnosis present

## 2013-06-06 DIAGNOSIS — IMO0002 Reserved for concepts with insufficient information to code with codable children: Secondary | ICD-10-CM

## 2013-06-06 DIAGNOSIS — I509 Heart failure, unspecified: Secondary | ICD-10-CM | POA: Diagnosis present

## 2013-06-06 LAB — CBC WITH DIFFERENTIAL/PLATELET
BASOS PCT: 0 % (ref 0–1)
Basophils Absolute: 0 10*3/uL (ref 0.0–0.1)
Eosinophils Absolute: 0 10*3/uL (ref 0.0–0.7)
Eosinophils Relative: 1 % (ref 0–5)
HEMATOCRIT: 29.6 % — AB (ref 36.0–46.0)
Hemoglobin: 10.3 g/dL — ABNORMAL LOW (ref 12.0–15.0)
LYMPHS ABS: 0.9 10*3/uL (ref 0.7–4.0)
Lymphocytes Relative: 10 % — ABNORMAL LOW (ref 12–46)
MCH: 34.9 pg — ABNORMAL HIGH (ref 26.0–34.0)
MCHC: 34.8 g/dL (ref 30.0–36.0)
MCV: 100.3 fL — ABNORMAL HIGH (ref 78.0–100.0)
MONO ABS: 0.4 10*3/uL (ref 0.1–1.0)
MONOS PCT: 5 % (ref 3–12)
NEUTROS ABS: 7.1 10*3/uL (ref 1.7–7.7)
Neutrophils Relative %: 84 % — ABNORMAL HIGH (ref 43–77)
Platelets: 177 10*3/uL (ref 150–400)
RBC: 2.95 MIL/uL — AB (ref 3.87–5.11)
RDW: 12.9 % (ref 11.5–15.5)
WBC: 8.5 10*3/uL (ref 4.0–10.5)

## 2013-06-06 LAB — COMPREHENSIVE METABOLIC PANEL
ALBUMIN: 3.4 g/dL — AB (ref 3.5–5.2)
ALK PHOS: 61 U/L (ref 39–117)
ALT: 9 U/L (ref 0–35)
AST: 13 U/L (ref 0–37)
BILIRUBIN TOTAL: 0.4 mg/dL (ref 0.3–1.2)
BUN: 14 mg/dL (ref 6–23)
CHLORIDE: 95 meq/L — AB (ref 96–112)
CO2: 30 meq/L (ref 19–32)
Calcium: 9.6 mg/dL (ref 8.4–10.5)
Creatinine, Ser: 0.69 mg/dL (ref 0.50–1.10)
GFR calc Af Amer: 89 mL/min — ABNORMAL LOW (ref >90)
GFR, EST NON AFRICAN AMERICAN: 77 mL/min — AB (ref >90)
GLUCOSE: 174 mg/dL — AB (ref 70–99)
Potassium: 4.4 mEq/L (ref 3.7–5.3)
Sodium: 136 mEq/L — ABNORMAL LOW (ref 137–147)
Total Protein: 6.8 g/dL (ref 6.0–8.3)

## 2013-06-06 LAB — CBC
HEMATOCRIT: 22.7 % — AB (ref 36.0–46.0)
Hemoglobin: 7.8 g/dL — ABNORMAL LOW (ref 12.0–15.0)
MCH: 34.7 pg — ABNORMAL HIGH (ref 26.0–34.0)
MCHC: 34.4 g/dL (ref 30.0–36.0)
MCV: 100.9 fL — ABNORMAL HIGH (ref 78.0–100.0)
Platelets: 167 10*3/uL (ref 150–400)
RBC: 2.25 MIL/uL — ABNORMAL LOW (ref 3.87–5.11)
RDW: 13.3 % (ref 11.5–15.5)
WBC: 8.3 10*3/uL (ref 4.0–10.5)

## 2013-06-06 LAB — LIPASE, BLOOD: LIPASE: 13 U/L (ref 11–59)

## 2013-06-06 LAB — GLUCOSE, CAPILLARY: Glucose-Capillary: 172 mg/dL — ABNORMAL HIGH (ref 70–99)

## 2013-06-06 LAB — PROTIME-INR
INR: 1.09 (ref 0.00–1.49)
Prothrombin Time: 13.9 seconds (ref 11.6–15.2)

## 2013-06-06 LAB — OCCULT BLOOD, POC DEVICE: FECAL OCCULT BLD: POSITIVE — AB

## 2013-06-06 LAB — CG4 I-STAT (LACTIC ACID): Lactic Acid, Venous: 0.87 mmol/L (ref 0.5–2.2)

## 2013-06-06 MED ORDER — PANTOPRAZOLE SODIUM 40 MG IV SOLR
80.0000 mg | Freq: Once | INTRAVENOUS | Status: AC
  Administered 2013-06-06: 80 mg via INTRAVENOUS
  Filled 2013-06-06: qty 80

## 2013-06-06 MED ORDER — ONDANSETRON HCL 4 MG PO TABS: 4.0000 mg | ORAL_TABLET | Freq: Four times a day (QID) | ORAL | Status: DC | PRN

## 2013-06-06 MED ORDER — IOHEXOL 300 MG/ML  SOLN: 50.0000 mL | Freq: Once | INTRAMUSCULAR | Status: AC | PRN

## 2013-06-06 MED ORDER — ONDANSETRON HCL 4 MG/2ML IJ SOLN: 4.0000 mg | Freq: Four times a day (QID) | INTRAMUSCULAR | Status: DC | PRN

## 2013-06-06 MED ORDER — SODIUM CHLORIDE 0.9 % IV BOLUS (SEPSIS)
500.0000 mL | Freq: Once | INTRAVENOUS | Status: AC
  Administered 2013-06-06: 500 mL via INTRAVENOUS

## 2013-06-06 MED ORDER — MORPHINE SULFATE 4 MG/ML IJ SOLN
4.0000 mg | Freq: Once | INTRAMUSCULAR | Status: AC
  Administered 2013-06-06: 4 mg via INTRAVENOUS
  Filled 2013-06-06: qty 1

## 2013-06-06 MED ORDER — SODIUM CHLORIDE 0.9 % IV SOLN
8.0000 mg/h | INTRAVENOUS | Status: AC
  Administered 2013-06-06 – 2013-06-09 (×5): 8 mg/h via INTRAVENOUS
  Filled 2013-06-06 (×15): qty 80

## 2013-06-06 MED ORDER — KCL IN DEXTROSE-NACL 20-5-0.45 MEQ/L-%-% IV SOLN
INTRAVENOUS | Status: DC
  Administered 2013-06-06 – 2013-06-12 (×9): via INTRAVENOUS
  Filled 2013-06-06 (×13): qty 1000

## 2013-06-06 MED ORDER — PANTOPRAZOLE SODIUM 40 MG IV SOLR
40.0000 mg | Freq: Two times a day (BID) | INTRAVENOUS | Status: DC
  Administered 2013-06-10: 40 mg via INTRAVENOUS
  Filled 2013-06-06 (×2): qty 40

## 2013-06-06 MED ORDER — OXYCODONE HCL 5 MG PO TABS
5.0000 mg | ORAL_TABLET | ORAL | Status: DC | PRN
  Administered 2013-06-06 – 2013-06-13 (×18): 5 mg via ORAL
  Filled 2013-06-06 (×18): qty 1

## 2013-06-06 MED ORDER — MORPHINE SULFATE 2 MG/ML IJ SOLN
1.0000 mg | INTRAMUSCULAR | Status: DC | PRN
  Administered 2013-06-07: 1 mg via INTRAVENOUS
  Filled 2013-06-06: qty 1

## 2013-06-06 MED ORDER — IOHEXOL 300 MG/ML  SOLN
80.0000 mL | Freq: Once | INTRAMUSCULAR | Status: AC | PRN
  Administered 2013-06-06: 80 mL via INTRAVENOUS

## 2013-06-06 MED ORDER — ONDANSETRON HCL 4 MG/2ML IJ SOLN
4.0000 mg | Freq: Once | INTRAMUSCULAR | Status: AC
  Administered 2013-06-06: 4 mg via INTRAVENOUS
  Filled 2013-06-06: qty 2

## 2013-06-06 MED ORDER — MORPHINE SULFATE 2 MG/ML IJ SOLN
2.0000 mg | Freq: Once | INTRAMUSCULAR | Status: AC
  Administered 2013-06-06: 2 mg via INTRAVENOUS
  Filled 2013-06-06: qty 1

## 2013-06-06 MED ORDER — INSULIN ASPART 100 UNIT/ML ~~LOC~~ SOLN
0.0000 [IU] | Freq: Three times a day (TID) | SUBCUTANEOUS | Status: DC
  Administered 2013-06-07 – 2013-06-08 (×3): 2 [IU] via SUBCUTANEOUS
  Administered 2013-06-09: 3 [IU] via SUBCUTANEOUS
  Administered 2013-06-09 – 2013-06-10 (×2): 2 [IU] via SUBCUTANEOUS
  Administered 2013-06-10 – 2013-06-14 (×7): 1 [IU] via SUBCUTANEOUS

## 2013-06-06 MED ORDER — PANTOPRAZOLE SODIUM 40 MG IV SOLR
40.0000 mg | Freq: Once | INTRAVENOUS | Status: AC
  Administered 2013-06-06: 40 mg via INTRAVENOUS
  Filled 2013-06-06: qty 40

## 2013-06-06 NOTE — ED Notes (Signed)
Pt states she had BM round 530am this morning and noticed small amount of blood in stool.  Then little later she has had two more episodes of loose, watery stools with bright red blood in toilet water.  Pt c/o lower abd pain and lower back pain. Pt has PMH colon cancer and currently undergoing chemo for Multiple myeloma.

## 2013-06-06 NOTE — Consult Note (Signed)
Reason for Consult: Ventral hernia with colon and bladder, no incarceration, but having bloody stools this AM. Referring Physician: Dr.  Sherwood Gambler (ED)  Christina Hopkins is an 80 y.o. female.  HPI: This is an 80 y/o on chemotherapy for multiple myeloma who was in her baseline state of health till this AM.  She got up and went to the [facility] for a BM, it was loose and there was some blood on the toilet paper and in the commode.  She has not had this occur since she was diagnosed with colon cancer.   She had 3 loose stools all with blood on toilet paper and in commode this AM after she got up.  She denies pain, nausea, or vomiting this AM, or before this AM. Because of the blood she came to the ER.  She did not complain of pain prior, she says she did have some nausea this AM with her loose stools.  She was fine yesterday, she reports eating well;  but not able to gain weight.  She takes some supplements for calories and protein;  this is what normally causes loose stools.  Here in the ER she is mildly distended and she is tender with palpation of the lower abdomen.  Labs show a small change in her H/H,  02/22/31.2 on 1/14;  Today she is 10.3/29.6.  WBC is 8.5.  Platelets 177K. Lactic acid is .87 - normal.  CT scan shows a large hiatal hernia, there is diffuse diverticulosis, no evidence of obstruction, there is a complex ventral hernia with small segment of bladder on the right side and colon on the left side.  No evidence of obstruction.  Diffuse degenerative spinal disease, and aortic calcification also noted.  We are ask to see because she is still tenderon exam in her lower abdomen.  Past Medical History  Diagnosis Date  Multiple myeloma dx'd 2009   oral chemo ongoing,  Last treatment 05/29/13     Diabetes mellitus   Hypertension   Anemia   Coronary artery disease (CT scan shows significant aortic calcification)  CABG 2003 in [place]   2D Echo 03/27/13:  EF 35-40% with grade 1 diastolic  dysfunction, Trival AR, and Mild TR  Myocardial infarction 2002     Osteoporosis   Dyslipidemia   colon ca/ colon resection 2003    She canceled last colonoscopy about 5 years ago  Fall 03/2013 with fx ribs on the left and pneumonia   GERD (gastroesophageal reflux disease)/Hiatal hernia   TIA about 1 year ago   Wears glasses     Past Surgical History  Procedure Laterality Date  . Coronary artery bypass graft  08/2001    4 vessel  . Colon surgery  2003  . Tonsillectomy    . Appendectomy    . Abdominal hysterectomy    . Back surgery    . Eye surgery      cataracts  . Direct laryngoscopy with radiaesse injection Left 08/13/2012    Procedure: LEFT VOCAL CORD RADIESSE AUGMENTATION LARYNGOSCOPY ;  Surgeon: Jodi Marble, MD;  Location: [facility];  Service: ENT;  Laterality: Left;    Family History  Problem Relation Age of Onset  . Heart disease Brother   . Diabetes Brother     Social History:  reports that she has never smoked. She has never used smokeless tobacco. She reports that she does not drink alcohol or use illicit drugs.  Allergies:  Allergies  Allergen Reactions  .  Gemfibrozil Nausea And Vomiting  . Nifedipine Hives  . Questran [Cholestyramine] Other (See Comments)    Do not remember   Prior to Admission medications   Medication Sig Start Date End Date Taking? Authorizing Provider  acetaminophen (TYLENOL) 650 MG CR tablet Take 650 mg by mouth every 8 (eight) hours as needed for pain.   Yes Historical Provider, MD  acyclovir (ZOVIRAX) 400 MG tablet Take 1 tablet (400 mg total) by mouth 2 (two) times daily. 02/15/13  Yes Concha Norway, MD  aspirin EC 81 MG tablet Take 81 mg by mouth daily.   Yes Historical Provider, MD  calcium carbonate (OS-CAL) 1250 MG chewable tablet Chew 1 tablet by mouth daily.   Yes Historical Provider, MD  carvedilol (COREG) 25 MG tablet Take 25 mg by mouth 2 (two) times daily with a meal.   Yes Historical Provider, MD   Cholecalciferol (VITAMIN D-3) 5000 UNITS TABS Take by mouth at bedtime.    Yes Historical Provider, MD  cyanocobalamin (,VITAMIN B-12,) 1000 MCG/ML injection Inject 1,000 mcg into the muscle every 30 (thirty) days.   Yes Historical Provider, MD  cyclobenzaprine (FLEXERIL) 10 MG tablet Take 1 tablet (10 mg total) by mouth 3 (three) times daily as needed. For muscle spasm 10/05/12  Yes Jeanie Cooks, MD  HYDROcodone-acetaminophen [facility]) 10-325 MG per tablet every 4 (four) hours as needed. Take one tablet by mouth every 6 hours as needed for pain 03/29/13  Yes Tiffany L Reed, DO  lisinopril (PRINIVIL,ZESTRIL) 2.5 MG tablet Take 2.5 mg by mouth every other day. 05/06/12  Yes Barton Dubois, MD  loperamide (IMODIUM) 2 MG capsule Take by mouth as needed for diarrhea or loose stools.   Yes Historical Provider, MD  magnesium oxide (MAG-OX) 400 MG tablet Take 400 mg by mouth daily.   Yes Historical Provider, MD  metFORMIN (GLUCOPHAGE) 500 MG tablet Take 500 mg by mouth 2 (two) times daily with a meal.     Yes Historical Provider, MD  Multiple Vitamin (MULTIVITAMIN WITH MINERALS) TABS Take 1 tablet by mouth daily.   Yes Historical Provider, MD  pantoprazole (PROTONIX) 40 MG tablet Take 40 mg by mouth daily.     Yes Historical Provider, MD  Polyethyl Glycol-Propyl Glycol (SYSTANE OP) Place 2 drops into both eyes at bedtime.    Yes Historical Provider, MD  prochlorperazine (COMPAZINE) 10 MG tablet Take 1 tablet (10 mg total) by mouth every 6 (six) hours as needed. For nausea 09/04/12  Yes Jeanie Cooks, MD  Sodium Fluoride (PREVIDENT DT) Place onto teeth at bedtime.   Yes Historical Provider, MD  spironolactone (ALDACTONE) 25 MG tablet Take 25 mg by mouth every other day.    Yes Historical Provider, MD      Anti-infectives   None      Results for orders placed during the hospital encounter of 06/06/13 (from the past 48 hour(s))  CBC WITH DIFFERENTIAL     Status: Abnormal   Collection Time     06/06/13  9:57 AM      Result Value Range   WBC 8.5  4.0 - 10.5 K/uL   RBC 2.95 (*) 3.87 - 5.11 MIL/uL   Hemoglobin 10.3 (*) 12.0 - 15.0 g/dL   HCT 29.6 (*) 36.0 - 46.0 %   MCV 100.3 (*) 78.0 - 100.0 fL   MCH 34.9 (*) 26.0 - 34.0 pg   MCHC 34.8  30.0 - 36.0 g/dL   RDW 12.9  11.5 - 15.5 %  Platelets 177  150 - 400 K/uL   Neutrophils Relative % 84 (*) 43 - 77 %   Neutro Abs 7.1  1.7 - 7.7 K/uL   Lymphocytes Relative 10 (*) 12 - 46 %   Lymphs Abs 0.9  0.7 - 4.0 K/uL   Monocytes Relative 5  3 - 12 %   Monocytes Absolute 0.4  0.1 - 1.0 K/uL   Eosinophils Relative 1  0 - 5 %   Eosinophils Absolute 0.0  0.0 - 0.7 K/uL   Basophils Relative 0  0 - 1 %   Basophils Absolute 0.0  0.0 - 0.1 K/uL  COMPREHENSIVE METABOLIC PANEL     Status: Abnormal   Collection Time    06/06/13  9:57 AM      Result Value Range   Sodium 136 (*) 137 - 147 mEq/L   Potassium 4.4  3.7 - 5.3 mEq/L   Chloride 95 (*) 96 - 112 mEq/L   CO2 30  19 - 32 mEq/L   Glucose, Bld 174 (*) 70 - 99 mg/dL   BUN 14  6 - 23 mg/dL   Creatinine, Ser 0.69  0.50 - 1.10 mg/dL   Calcium 9.6  8.4 - 10.5 mg/dL   Total Protein 6.8  6.0 - 8.3 g/dL   Albumin 3.4 (*) 3.5 - 5.2 g/dL   AST 13  0 - 37 U/L   ALT 9  0 - 35 U/L   Alkaline Phosphatase 61  39 - 117 U/L   Total Bilirubin 0.4  0.3 - 1.2 mg/dL   GFR calc non Af Amer 77 (*) >90 mL/min   GFR calc Af Amer 89 (*) >90 mL/min   Comment: (NOTE)     The eGFR has been calculated using the CKD EPI equation.     This calculation has not been validated in all clinical situations.     eGFR's persistently <90 mL/min signify possible Chronic Kidney     Disease.  LIPASE, BLOOD     Status: None   Collection Time    06/06/13  9:57 AM      Result Value Range   Lipase 13  11 - 59 U/L  PROTIME-INR     Status: None   Collection Time    06/06/13  9:57 AM      Result Value Range   Prothrombin Time 13.9  11.6 - 15.2 seconds   INR 1.09  0.00 - 1.49  CG4 I-STAT (LACTIC ACID)     Status: None    Collection Time    06/06/13 10:06 AM      Result Value Range   Lactic Acid, Venous 0.87  0.5 - 2.2 mmol/L    Ct Abdomen Pelvis W Contrast  06/06/2013   CLINICAL DATA:  Rectal bleeding and abdominal pain.  EXAM: CT ABDOMEN AND PELVIS WITH CONTRAST  TECHNIQUE: Multidetector CT imaging of the abdomen and pelvis was performed using the standard protocol following bolus administration of intravenous contrast.  CONTRAST:  27m OMNIPAQUE IOHEXOL 300 MG/ML  SOLN  COMPARISON:  None.  FINDINGS: There is a large hiatal hernia. There is diffuse diverticulosis of the distal colon without evidence of diverticulitis or bowel obstruction. No definite focal lesion of the bowel is identified.  The liver shows mild and diffuse steatosis. No focal hepatic lesions are identified. There is at least one dependent gallstone in the gallbladder. No biliary ductal dilatation is present. The pancreas is atrophic. Adrenal glands, kidneys and spleen are unremarkable.  The  aorta, vessel branch vessels, iliac arteries and femoral arteries are diffusely and heavily calcified. No aneurysm is detected. No enlarged lymph nodes are seen.  In the pelvis, complex ventral hernia is identified beginning just above the pubic bone. Two components of hernia are identified with a small segment of the bladder in a right-sided component and portions of the colon in the left-sided component. There are no findings by CT to suggest overt incarceration.  The spine shows diffuse degenerative changes as well as evidence of multilevel prior vertebral augmentation.  IMPRESSION: 1. Hiatal hernia and colonic diverticulosis. No focal bowel lesion is identified by CT. 2. Complex lower pelvic ventral hernia containing portions of the bladder and colon without signs of incarceration.   Electronically Signed   By: Aletta Edouard M.D.   On: 06/06/2013 12:09    Review of Systems  Constitutional: Negative.   HENT: Negative.   Eyes: Negative.   Respiratory: Positive  for cough (dry cough, or clear sputum with cough) and shortness of breath (DOE, she can walk at most 60 feet with cane before stopping.). Negative for hemoptysis, sputum production and wheezing.   Cardiovascular: Positive for palpitations (occasional with exertion), orthopnea (she is more comfortable with her head up to sleep, she isn't sure exactly why.) and claudication (calves hurt some when walking). Negative for PND.  Gastrointestinal: Positive for heartburn (on PPI and TUM's PRN), nausea (some this AM nothing prior to this AM), abdominal pain (Sore now, not sure when that started no pain yesterday or prior to today.), diarrhea (she had 3 loose stools this Am with some blood on tissue and in toilet, she has not seen this before) and blood in stool (today first time since she had colon cancer). Negative for vomiting and constipation.  Genitourinary: Negative.   Musculoskeletal: Positive for back pain (chronic back pain, not bad but ongoing issue) and falls (she fell back in November 2014, with left 7,8, & 9th rib fx, pneumonia.).  Skin: Negative.   Neurological: Negative.   Endo/Heme/Allergies: Bruises/bleeds easily.  Psychiatric/Behavioral: Negative.    Blood pressure 104/53, pulse 91, temperature 98.3 F (36.8 C), temperature source Oral, resp. rate 18, SpO2 89.00%. Physical Exam  Constitutional: She is oriented to person, place, and time. No distress.  Thin frail 80 y/o in no acute distress.  HENT:  Head: Normocephalic and atraumatic.  Nose: Nose normal.  Eyes: Conjunctivae and EOM are normal. Pupils are equal, round, and reactive to light. Right eye exhibits no discharge. Left eye exhibits no discharge. No scleral icterus.  Neck: Normal range of motion. Neck supple. No JVD present. No tracheal deviation present. No thyromegaly present.  Loud left carotid bruit Softer right carotid bruit  Cardiovascular: Normal rate and regular rhythm.   Murmur (soft grade 1 mumur aortic and mitral  postions) heard. I had difficulty feeling distal pulses, and femorals  Respiratory: Effort normal and breath sounds normal. No stridor. No respiratory distress. She has no wheezes. She has no rales. She exhibits no tenderness.  GI: Soft. She exhibits distension (she has mild distension with some hyperacitve BS). She exhibits no mass. There is tenderness (she still has some tenderness with palpation lower abdomen). There is no rebound and no guarding.  Genitourinary:  Blood present at rectum, DR. Dalbert Batman could not feel hemorrhoids.  Musculoskeletal: She exhibits no edema and no tenderness.  Lymphadenopathy:    She has no cervical adenopathy.  Neurological: She is alert and oriented to person, place, and time. No cranial nerve deficit.  Skin: Skin is warm and dry. No rash noted. She is not diaphoretic. No erythema. No pallor.  Psychiatric: She has a normal mood and affect. Her behavior is normal. Judgment and thought content normal.  Minimal memory issues    Assessment/Plan: 1.  Bloody stools 2.  Abdominal pain with large hiatal hernia, ventral hernia with colon and bladder in it.  Neither appear incarcerated. 3.  Hx of colon cancer with colon resection 2003/[place]/[facility] 4.  Multiple myeloma with ongoing chemotherapy, last dose 05/29/13. 5.  CAD; hx of MI 2002, CABG 2003/2D Echo 03/27/13 with EF 61-95%, grade 1 Diastolic dysfunction,Trival AR/Mod TR 6.  AODM 7.  Hypertension  8.  Dyslipidemia 9.  Chronic back pain 10. Fall last year with 3 fx ribs/pneumonia  Plan:  She is currently mildly distended and still somewhat tender.  This happened rather acutely, and it does concern me that it may be due to some incarceration this AM, but the hernia is not new and I doubt it is due to incarceration.  I would keep her NPO, just some sips and chips for now, PO meds as needed.  I will recheck her film in AM.  As long as she is not incarcerated she does not need to have surgery.  She  will need to have her H/H followed closely, with GI evaluation/hx of colon cancer, she canceled her last colonoscopy. She has allot of morbidities making surgery a difficult option. We will follow closely with you. I have ordered labs for AM also.  Mel Tadros 06/06/2013, 2:08 PM

## 2013-06-06 NOTE — Consult Note (Signed)
General surgery attending [facility]  I personally interviewed and examined this patient this afternoon. I reviewed her CT scan. I agree with the assessment treatment plan outlined by Mr. Creig Hines, [place].  In terms of her hiatal hernia, it is large but is really not particularly symptomatic. She assault her food well without dysphagia or painful swallowing. A episodes of choking or obstruction noted. Her GI function seems normal and she has regular bowel movements.  In terms of her ventral hernias, she actually has several but they are soft, easily defined, and easily reducible. Her abdomen is really not very at all.There is no evidence of compromised bowel, obstructed bowel, and I do not think there is any need for surgical intervention of her ventral hernias.  In terms of her rectal bleeding, it is fairly bright red and so is probably a more distal source. I could not see an actual source on exam and digital exam doesn't reveal a mass. GI consultation for consideration of colonoscopy would be suggested.   Assessment/Recommendations:  1. Bloody stools - Source not clear. Probably distal since bright red. Recommend GI consultation. 2. Abdominal pain with large hiatal hernia, ventral hernia with colon and bladder in it. Neither appear incarcerated. There is no indication for surgical intervention for her hiatal hernia or her multiple ventral incisional hernias. 3. Hx of colon cancer with colon resection 2003/[place]/[facility]-Dr. Lucillie Garfinkel. 4. Multiple myeloma with ongoing chemotherapy, last dose 05/29/13.  5. CAD; hx of MI 2002, CABG 2003/2D Echo 03/27/13 with EF 45-03%, grade 1 Diastolic dysfunction,Trival AR/Mod TR  6. AODM  7. Hypertension  8. Dyslipidemia  9. Chronic back pain  10. Fall last year with 3 fx ribs/pneumonia  Will follow up tomorrow.  Edsel Petrin. Dalbert Batman, M.D., [facility] General and Minimally invasive Surgery Breast and Colorectal  Surgery Office:   [phone] Pager:   [phone]

## 2013-06-06 NOTE — ED Notes (Signed)
Unsuccessful IV attempt.  IV Team paged.

## 2013-06-06 NOTE — Progress Notes (Signed)
[facility] Consult Note  Referring Provider: No ref. provider found Primary Care Physician:  HARRIS, WILLIAM, MD Primary Gastroenterologist:  Dr.  Chief Complaint: Rectal bleeding HPI: Christina Hopkins is an 80 y.o. white female  in her usual state of health until she had a normal urge to have bowel movement this morning followed by bright red blood per rectum with soft stool with 2 or 3 more occurrences. She denies any abdominal pain. She has had no previous lower GI bleeds but has a history of colon cancer with last colonoscopy about 8 years ago. She had an abdominal CT scan which showed diverticulosis and a complex ventral hernia involving colon and  bladder without incarceration. She also has history of multiple myeloma and is currently undergoing treatment for that.  Past Medical History  Diagnosis Date  . Hypertension   . Diabetes mellitus   . Anemia   . Osteoporosis   . Dyslipidemia   . Myocardial infarction   . colon ca dx'd 2003    surg only  . Multiple myeloma dx'd 2009    oral chemo ongoing  . Coronary artery disease   . Shortness of breath   . GERD (gastroesophageal reflux disease)   . Wears glasses     Past Surgical History  Procedure Laterality Date  . Coronary artery bypass graft  08/2001    4 vessel  . Colon surgery  2003  . Tonsillectomy    . Appendectomy    . Abdominal hysterectomy    . Back surgery    . Eye surgery      cataracts  . Direct laryngoscopy with radiaesse injection Left 08/13/2012    Procedure: LEFT VOCAL CORD RADIESSE AUGMENTATION LARYNGOSCOPY ;  Surgeon: Karol Wolicki, MD;  Location: [facility];  Service: ENT;  Laterality: Left;     (Not in a hospital admission)  Allergies:  Allergies  Allergen Reactions  . Gemfibrozil Nausea And Vomiting  . Nifedipine Hives  . Questran [Cholestyramine] Other (See Comments)    Do not remember    Family History  Problem Relation Age of Onset  . Heart disease Brother   .  Diabetes Brother     Social History:  reports that she has never smoked. She has never used smokeless tobacco. She reports that she does not drink alcohol or use illicit drugs.  Review of Systems: negative except as above   Blood pressure 104/53, pulse 91, temperature 98.3 F (36.8 C), temperature source Oral, resp. rate 18, SpO2 89.00%. Head: Normocephalic, without obvious abnormality, atraumatic Neck: no adenopathy, no carotid bruit, no JVD, supple, symmetrical, trachea midline and thyroid not enlarged, symmetric, no tenderness/mass/nodules Resp: clear to auscultation bilaterally Cardio: regular rate and rhythm, S1, S2 normal, no murmur, click, rub or gallop GI: Abdomen soft with irregular contour corresponding with abdominal wall herniation. Extremities: extremities normal, atraumatic, no cyanosis or edema  Results for orders placed during the hospital encounter of 06/06/13 (from the past 48 hour(s))  OCCULT BLOOD, POC DEVICE     Status: Abnormal   Collection Time    06/06/13  9:09 AM      Result Value Range   Fecal Occult Bld POSITIVE (*) NEGATIVE  CBC WITH DIFFERENTIAL     Status: Abnormal   Collection Time    06/06/13  9:57 AM      Result Value Range   WBC 8.5  4.0 - 10.5 K/uL   RBC 2.95 (*) 3.87 - 5.11 MIL/uL   Hemoglobin 10.3 (*)   12.0 - 15.0 g/dL   HCT 29.6 (*) 36.0 - 46.0 %   MCV 100.3 (*) 78.0 - 100.0 fL   MCH 34.9 (*) 26.0 - 34.0 pg   MCHC 34.8  30.0 - 36.0 g/dL   RDW 12.9  11.5 - 15.5 %   Platelets 177  150 - 400 K/uL   Neutrophils Relative % 84 (*) 43 - 77 %   Neutro Abs 7.1  1.7 - 7.7 K/uL   Lymphocytes Relative 10 (*) 12 - 46 %   Lymphs Abs 0.9  0.7 - 4.0 K/uL   Monocytes Relative 5  3 - 12 %   Monocytes Absolute 0.4  0.1 - 1.0 K/uL   Eosinophils Relative 1  0 - 5 %   Eosinophils Absolute 0.0  0.0 - 0.7 K/uL   Basophils Relative 0  0 - 1 %   Basophils Absolute 0.0  0.0 - 0.1 K/uL  COMPREHENSIVE METABOLIC PANEL     Status: Abnormal   Collection Time     06/06/13  9:57 AM      Result Value Range   Sodium 136 (*) 137 - 147 mEq/L   Potassium 4.4  3.7 - 5.3 mEq/L   Chloride 95 (*) 96 - 112 mEq/L   CO2 30  19 - 32 mEq/L   Glucose, Bld 174 (*) 70 - 99 mg/dL   BUN 14  6 - 23 mg/dL   Creatinine, Ser 0.69  0.50 - 1.10 mg/dL   Calcium 9.6  8.4 - 10.5 mg/dL   Total Protein 6.8  6.0 - 8.3 g/dL   Albumin 3.4 (*) 3.5 - 5.2 g/dL   AST 13  0 - 37 U/L   ALT 9  0 - 35 U/L   Alkaline Phosphatase 61  39 - 117 U/L   Total Bilirubin 0.4  0.3 - 1.2 mg/dL   GFR calc non Af Amer 77 (*) >90 mL/min   GFR calc Af Amer 89 (*) >90 mL/min   Comment: (NOTE)     The eGFR has been calculated using the CKD EPI equation.     This calculation has not been validated in all clinical situations.     eGFR's persistently <90 mL/min signify possible Chronic Kidney     Disease.  LIPASE, BLOOD     Status: None   Collection Time    06/06/13  9:57 AM      Result Value Range   Lipase 13  11 - 59 U/L  PROTIME-INR     Status: None   Collection Time    06/06/13  9:57 AM      Result Value Range   Prothrombin Time 13.9  11.6 - 15.2 seconds   INR 1.09  0.00 - 1.49  CG4 I-STAT (LACTIC ACID)     Status: None   Collection Time    06/06/13 10:06 AM      Result Value Range   Lactic Acid, Venous 0.87  0.5 - 2.2 mmol/L   Ct Abdomen Pelvis W Contrast  06/06/2013   CLINICAL DATA:  Rectal bleeding and abdominal pain.  EXAM: CT ABDOMEN AND PELVIS WITH CONTRAST  TECHNIQUE: Multidetector CT imaging of the abdomen and pelvis was performed using the standard protocol following bolus administration of intravenous contrast.  CONTRAST:  80mL OMNIPAQUE IOHEXOL 300 MG/ML  SOLN  COMPARISON:  None.  FINDINGS: There is a large hiatal hernia. There is diffuse diverticulosis of the distal colon without evidence of diverticulitis or bowel obstruction. No definite   focal lesion of the bowel is identified.  The liver shows mild and diffuse steatosis. No focal hepatic lesions are identified. There is at  least one dependent gallstone in the gallbladder. No biliary ductal dilatation is present. The pancreas is atrophic. Adrenal glands, kidneys and spleen are unremarkable.  The aorta, vessel branch vessels, iliac arteries and femoral arteries are diffusely and heavily calcified. No aneurysm is detected. No enlarged lymph nodes are seen.  In the pelvis, complex ventral hernia is identified beginning just above the pubic bone. Two components of hernia are identified with a small segment of the bladder in a right-sided component and portions of the colon in the left-sided component. There are no findings by CT to suggest overt incarceration.  The spine shows diffuse degenerative changes as well as evidence of multilevel prior vertebral augmentation.  IMPRESSION: 1. Hiatal hernia and colonic diverticulosis. No focal bowel lesion is identified by CT. 2. Complex lower pelvic ventral hernia containing portions of the bladder and colon without signs of incarceration.   Electronically Signed   By: Glenn  Yamagata M.D.   On: 06/06/2013 12:09    Assessment: Painless hematochezia most likely representing a diverticular bleed, the patient stable at present  Multiple myeloma  Abdominal wall hernia Plan:  Will monitor stools and hemoglobin, transfuse as necessary, consider whether to pursue colonoscopy. Would likely be incomplete given the complex hernia and it may be prudent to simply manage expectantly if bleeding stops on its on. We'll follow with you. , C 06/06/2013, 5:04 PM    

## 2013-06-06 NOTE — Progress Notes (Signed)
UR completed 

## 2013-06-06 NOTE — ED Notes (Signed)
IV team at bedside 

## 2013-06-06 NOTE — ED Notes (Signed)
MD at bedside. 

## 2013-06-06 NOTE — ED Provider Notes (Signed)
CSN: [id number]     Arrival date & time 06/06/13  7096 History   First MD Initiated Contact with Patient 06/06/13 [phone]     Chief Complaint  Patient presents with  . Rectal Bleeding   (Consider location/radiation/quality/duration/timing/severity/associated sxs/prior Treatment) HPI Comments: 80 year old female presents with 3 episodes of bloody stool. The first episode started about 5:30 AM (about 3 hours PTA). She states she's also been having some abdominal pain during this time. She has chronic back pain but it feels low but worse as well. She's not felt lightheaded, chest pain, or dizziness. She states for full nauseous but has not vomited. She is mostly having watery stools with blood as well. She has a remote history of colon cancer with a right colectomy has not had any issues since then. She's not have recurrent gastroenterologist. She's not on any blood thinners. She rates the pain as about a 7/10. She does have a history of abdominal hernias but states she has not noticed any bulging recently. No abdominal distention.   Past Medical History  Diagnosis Date  . Hypertension   . Diabetes mellitus   . Anemia   . Osteoporosis   . Dyslipidemia   . Myocardial infarction   . colon ca dx'd 2003    surg only  . Multiple myeloma dx'd 2009    oral chemo ongoing  . Coronary artery disease   . Shortness of breath   . GERD (gastroesophageal reflux disease)   . Wears glasses    Past Surgical History  Procedure Laterality Date  . Coronary artery bypass graft  08/2001    4 vessel  . Colon surgery  2003  . Tonsillectomy    . Appendectomy    . Abdominal hysterectomy    . Back surgery    . Eye surgery      cataracts  . Direct laryngoscopy with radiaesse injection Left 08/13/2012    Procedure: LEFT VOCAL CORD RADIESSE AUGMENTATION LARYNGOSCOPY ;  Surgeon: Jodi Marble, MD;  Location: [place];  Service: ENT;  Laterality: Left;   Family History  Problem Relation Age of  Onset  . Heart disease Brother   . Diabetes Brother    History  Substance Use Topics  . Smoking status: Never Smoker   . Smokeless tobacco: Never Used  . Alcohol Use: No   OB History   Grav Para Term Preterm Abortions TAB SAB Ect Mult Living                 Review of Systems  Constitutional: Negative for fatigue.  Respiratory: Negative for shortness of breath.   Cardiovascular: Negative for chest pain.  Gastrointestinal: Positive for nausea, abdominal pain, diarrhea, blood in stool and hematochezia. Negative for vomiting and abdominal distention.  Musculoskeletal: Positive for back pain.  Neurological: Negative for dizziness and light-headedness.  All other systems reviewed and are negative.    Allergies  Gemfibrozil; Nifedipine; and Questran  Home Medications   Current Outpatient Rx  Name  Route  Sig  Dispense  Refill  . acetaminophen (TYLENOL) 650 MG CR tablet   Oral   Take 650 mg by mouth every 8 (eight) hours as needed for pain.         Marland Kitchen acyclovir (ZOVIRAX) 400 MG tablet   Oral   Take 1 tablet (400 mg total) by mouth 2 (two) times daily.   60 tablet   3   . Alum & Mag Hydroxide-Simeth (MAGIC MOUTHWASH) SOLN   Swish &  Spit   Swish and spit 5 mLs 4 (four) times daily as needed (for mouth ulcers).          Marland Kitchen aspirin EC 81 MG tablet   Oral   Take 81 mg by mouth daily.         . carvedilol (COREG) 25 MG tablet   Oral   Take 25 mg by mouth 2 (two) times daily with a meal.         . Cholecalciferol (VITAMIN D-3) 5000 UNITS TABS   Oral   Take by mouth at bedtime. Taking total of 7000 units per day.         . cyanocobalamin (,VITAMIN B-12,) 1000 MCG/ML injection   Intramuscular   Inject 1,000 mcg into the muscle every 30 (thirty) days.         . cyclobenzaprine (FLEXERIL) 10 MG tablet   Oral   Take 1 tablet (10 mg total) by mouth 3 (three) times daily as needed. For muscle spasm   90 tablet   1   . darbepoetin (ARANESP) 300 MCG/0.6ML SOLN    Subcutaneous   Inject 300 mcg into the skin as needed (given if needed for anemia).         Marland Kitchen HYDROcodone-acetaminophen (NORCO) 10-325 MG per tablet      every 4 (four) hours as needed. Take one tablet by mouth every 6 hours as needed for pain         . lisinopril (PRINIVIL,ZESTRIL) 2.5 MG tablet   Oral   Take 2.5 mg by mouth every other day.         . loperamide (IMODIUM) 2 MG capsule   Oral   Take by mouth as needed for diarrhea or loose stools.         . magnesium oxide (MAG-OX) 400 MG tablet   Oral   Take 400 mg by mouth daily.         . metFORMIN (GLUCOPHAGE) 500 MG tablet   Oral   Take 500 mg by mouth 2 (two) times daily with a meal.           . Multiple Vitamin (MULTIVITAMIN WITH MINERALS) TABS   Oral   Take 1 tablet by mouth daily.         . pantoprazole (PROTONIX) 40 MG tablet   Oral   Take 40 mg by mouth daily.           Vladimir Faster Glycol-Propyl Glycol (SYSTANE OP)   Both Eyes   Place 2 drops into both eyes at bedtime.          . prochlorperazine (COMPAZINE) 10 MG tablet   Oral   Take 1 tablet (10 mg total) by mouth every 6 (six) hours as needed. For nausea   30 tablet   3   . Sodium Fluoride (PREVIDENT DT)   dental   Place onto teeth at bedtime.         Marland Kitchen spironolactone (ALDACTONE) 25 MG tablet   Oral   Take 25 mg by mouth every other day.           BP 104/53  Pulse 91  Temp(Src) 98.3 F (36.8 C) (Oral)  Resp 18  SpO2 94% Physical Exam  Nursing note and vitals reviewed. Constitutional: She is oriented to person, place, and time. She appears well-developed and well-nourished. No distress.  HENT:  Head: Normocephalic and atraumatic.  Right Ear: External ear normal.  Left Ear: External ear normal.  Nose:  Nose normal.  Eyes: Right eye exhibits no discharge. Left eye exhibits no discharge.  Cardiovascular: Normal rate, regular rhythm and normal heart sounds.   Pulmonary/Chest: Effort normal and breath sounds normal.   Abdominal: Soft. She exhibits no distension. There is generalized tenderness (diffuse). A hernia (seems to have a lower abdominal wall defect but is soft, seems to have some bowel that moves in an out but not incarcerated) is present.  Genitourinary: Rectal exam shows no external hemorrhoid, no internal hemorrhoid and no mass. Guaiac positive stool (dark blood).  Musculoskeletal:       Lumbar back: She exhibits tenderness (Diffuse lower back tenderness).  Neurological: She is alert and oriented to person, place, and time.  Skin: Skin is warm and dry.    ED Course  Procedures (including critical care time) Labs Review Labs Reviewed  CBC WITH DIFFERENTIAL - Abnormal; Notable for the following:    RBC 2.95 (*)    Hemoglobin 10.3 (*)    HCT 29.6 (*)    MCV 100.3 (*)    MCH 34.9 (*)    Neutrophils Relative % 84 (*)    Lymphocytes Relative 10 (*)    All other components within normal limits  COMPREHENSIVE METABOLIC PANEL - Abnormal; Notable for the following:    Sodium 136 (*)    Chloride 95 (*)    Glucose, Bld 174 (*)    Albumin 3.4 (*)    GFR calc non Af Amer 77 (*)    GFR calc Af Amer 89 (*)    All other components within normal limits  OCCULT BLOOD, POC DEVICE - Abnormal; Notable for the following:    Fecal Occult Bld POSITIVE (*)    All other components within normal limits  LIPASE, BLOOD  PROTIME-INR  CG4 I-STAT (LACTIC ACID)   Imaging Review Ct Abdomen Pelvis W Contrast  06/06/2013   CLINICAL DATA:  Rectal bleeding and abdominal pain.  EXAM: CT ABDOMEN AND PELVIS WITH CONTRAST  TECHNIQUE: Multidetector CT imaging of the abdomen and pelvis was performed using the standard protocol following bolus administration of intravenous contrast.  CONTRAST:  40m OMNIPAQUE IOHEXOL 300 MG/ML  SOLN  COMPARISON:  None.  FINDINGS: There is a large hiatal hernia. There is diffuse diverticulosis of the distal colon without evidence of diverticulitis or bowel obstruction. No definite focal  lesion of the bowel is identified.  The liver shows mild and diffuse steatosis. No focal hepatic lesions are identified. There is at least one dependent gallstone in the gallbladder. No biliary ductal dilatation is present. The pancreas is atrophic. Adrenal glands, kidneys and spleen are unremarkable.  The aorta, vessel branch vessels, iliac arteries and femoral arteries are diffusely and heavily calcified. No aneurysm is detected. No enlarged lymph nodes are seen.  In the pelvis, complex ventral hernia is identified beginning just above the pubic bone. Two components of hernia are identified with a small segment of the bladder in a right-sided component and portions of the colon in the left-sided component. There are no findings by CT to suggest overt incarceration.  The spine shows diffuse degenerative changes as well as evidence of multilevel prior vertebral augmentation.  IMPRESSION: 1. Hiatal hernia and colonic diverticulosis. No focal bowel lesion is identified by CT. 2. Complex lower pelvic ventral hernia containing portions of the bladder and colon without signs of incarceration.   Electronically Signed   By: GAletta EdouardM.D.   On: 06/06/2013 12:09    EKG Interpretation   None  MDM   1. GI bleed   2. Abdominal pain    Patient is well appearing but has abd pain, worst at site of hernia. No signs of incarceration on exam or CT. I discussed the case with surgeon on call, who feels with the CT as it is and reducible bowel the bleeding is not related to hernia. Given her multiple episodes of gross blood by rectum combined with age and comorbidities she will need to be watched in hospital with GI consult.     Ephraim Hamburger, MD 06/06/13 [phone]

## 2013-06-06 NOTE — H&P (Signed)
[facility] History and Physical  KASAUNDRA FAHRNEY HAL:[id number] DOB: 1925/11/12 DOA: 06/06/2013  Referring physician: Dr. Verner Chol PCP: [name], MD   Chief Complaint: bright red blood in her stool and lower right abdominal pain.  HPI: Christina Hopkins is a 80 y.o. female  With a history of colon cancer with resection (2003), multiple myeloma (currently receiving chemo), HTN, fall with fractured ribs (Nov 2014), Diabetes, osteoporosis,  and CAD. Pt reports having a bowel movement at around 0530 this morning and noticing bright red blood in her stool. Pt reports having a total of three soft bloody stools today. Pt initially had lower left abdominal pain, but took a Norco at home that helped with the pain. Pt reports some nausea without vomiting. Movement and palpation make the abdominal pain worse. Pt denies dizziness, chest pain, or being lightheaded.  While in the ED the patient had a CT of the abdomen showing: hiatal hernia and colonic diverticulosis. No focal bowel lesion. Complex lower pelvic ventral hernia containing portions of the bladder and colon without signs of incarceration. Pt anemic with positive guaiac (dark blood) in stool so we were consulted along with GI and Surgery.   Review of Systems:  Constitutional: Alert and oriented No weight loss, night sweats, Fevers, chills, fatigue.  HEENT:  No headaches, Difficulty swallowing,Tooth/dental problems,Sore throat,  No sneezing, itching, ear ache, nasal congestion, post nasal drip,  Cardio-vascular:  No chest pain, Orthopnea, PND, swelling in lower extremities, anasarca, dizziness, palpitations  GI: + abdominal pain, + nausea, + bright red blood in stools No heartburn, indigestion,  vomiting, diarrhea, change in bowel habits, loss of appetite  Resp: + cough No shortness of breath with exertion or at rest. No excess mucus, no productive cough, No coughing up of blood.No change in color of mucus.No wheezing.No chest wall  deformity  Skin:  no rash or lesions.  GU:  no dysuria, change in color of urine, no urgency or frequency. No flank pain.  Musculoskeletal:  No joint pain or swelling. No decreased range of motion. + lower back pain (chronic).  Psych:  No change in mood or affect. No depression or anxiety. No memory loss.   Past Medical History  Diagnosis Date  . Hypertension   . Diabetes mellitus   . Anemia   . Osteoporosis   . Dyslipidemia   . Myocardial infarction   . colon ca dx'd 2003    surg only  . Multiple myeloma dx'd 2009    oral chemo ongoing  . Coronary artery disease   . Shortness of breath   . GERD (gastroesophageal reflux disease)   . Wears glasses    Past Surgical History  Procedure Laterality Date  . Coronary artery bypass graft  08/2001    4 vessel  . Colon surgery  2003  . Tonsillectomy    . Appendectomy    . Abdominal hysterectomy    . Back surgery    . Eye surgery      cataracts  . Direct laryngoscopy with radiaesse injection Left 08/13/2012    Procedure: LEFT VOCAL CORD RADIESSE AUGMENTATION LARYNGOSCOPY ;  Surgeon: Jodi Marble, MD;  Location: [place];  Service: ENT;  Laterality: Left;   Social History:  reports that she has never smoked. She has never used smokeless tobacco. She reports that she does not drink alcohol or use illicit drugs.  Allergies  Allergen Reactions  . Gemfibrozil Nausea And Vomiting  . Nifedipine Hives  . Questran [Cholestyramine] Other (  See Comments)    Do not remember    Family History  Problem Relation Age of Onset  . Heart disease Brother   . Diabetes Brother      Prior to Admission medications   Medication Sig Start Date End Date Taking? Authorizing Provider  acetaminophen (TYLENOL) 650 MG CR tablet Take 650 mg by mouth every 8 (eight) hours as needed for pain.   Yes Historical Provider, MD  acyclovir (ZOVIRAX) 400 MG tablet Take 1 tablet (400 mg total) by mouth 2 (two) times daily. 02/15/13  Yes Concha Norway, MD  aspirin EC 81 MG tablet Take 81 mg by mouth daily.   Yes Historical Provider, MD  calcium carbonate (OS-CAL) 1250 MG chewable tablet Chew 1 tablet by mouth daily.   Yes Historical Provider, MD  carvedilol (COREG) 25 MG tablet Take 25 mg by mouth 2 (two) times daily with a meal.   Yes Historical Provider, MD  Cholecalciferol (VITAMIN D-3) 5000 UNITS TABS Take by mouth at bedtime.    Yes Historical Provider, MD  cyanocobalamin (,VITAMIN B-12,) 1000 MCG/ML injection Inject 1,000 mcg into the muscle every 30 (thirty) days.   Yes Historical Provider, MD  cyclobenzaprine (FLEXERIL) 10 MG tablet Take 1 tablet (10 mg total) by mouth 3 (three) times daily as needed. For muscle spasm 10/05/12  Yes Jeanie Cooks, MD  HYDROcodone-acetaminophen [facility]) 10-325 MG per tablet every 4 (four) hours as needed. Take one tablet by mouth every 6 hours as needed for pain 03/29/13  Yes Tiffany L Reed, DO  lisinopril (PRINIVIL,ZESTRIL) 2.5 MG tablet Take 2.5 mg by mouth every other day. 05/06/12  Yes Barton Dubois, MD  loperamide (IMODIUM) 2 MG capsule Take by mouth as needed for diarrhea or loose stools.   Yes Historical Provider, MD  magnesium oxide (MAG-OX) 400 MG tablet Take 400 mg by mouth daily.   Yes Historical Provider, MD  metFORMIN (GLUCOPHAGE) 500 MG tablet Take 500 mg by mouth 2 (two) times daily with a meal.     Yes Historical Provider, MD  Multiple Vitamin (MULTIVITAMIN WITH MINERALS) TABS Take 1 tablet by mouth daily.   Yes Historical Provider, MD  pantoprazole (PROTONIX) 40 MG tablet Take 40 mg by mouth daily.     Yes Historical Provider, MD  Polyethyl Glycol-Propyl Glycol (SYSTANE OP) Place 2 drops into both eyes at bedtime.    Yes Historical Provider, MD  prochlorperazine (COMPAZINE) 10 MG tablet Take 1 tablet (10 mg total) by mouth every 6 (six) hours as needed. For nausea 09/04/12  Yes Jeanie Cooks, MD  Sodium Fluoride (PREVIDENT DT) Place onto teeth at bedtime.   Yes Historical  Provider, MD  spironolactone (ALDACTONE) 25 MG tablet Take 25 mg by mouth every other day.    Yes Historical Provider, MD   Physical Exam: Filed Vitals:   06/06/13 0847  BP: 104/53  Pulse: 91  Temp: 98.3 F (36.8 C)  Resp: 18    BP 104/53  Pulse 91  Temp(Src) 98.3 F (36.8 C) (Oral)  Resp 18  SpO2 89%  General:  Appears calm and comfortable, Alert and Oriented Eyes: PERRL, normal lids, irises & conjunctiva ENT: grossly normal hearing, lips & tongue Neck: no LAD, masses or thyromegaly Cardiovascular: RRR, no m/r/g. No LE edema. Respiratory: CTA bilaterally, no w/r/r. Normal respiratory effort. Abdomen: soft, + diffuse tenderness, + abdominal hernia Skin: no rash or induration seen on limited exam Musculoskeletal: grossly normal tone BUE/BLE, +kyphosis, +lower back pain with palpation Psychiatric:  grossly normal mood and affect, speech fluent and appropriate Neurologic: facial symmetry, answers questions appropriately, moves all extremities.          Labs on Admission:  Basic Metabolic Panel:  Recent Labs Lab 06/06/13 0957  NA 136*  K 4.4  CL 95*  CO2 30  GLUCOSE 174*  BUN 14  CREATININE 0.69  CALCIUM 9.6   Liver Function Tests:  Recent Labs Lab 06/06/13 0957  AST 13  ALT 9  ALKPHOS 61  BILITOT 0.4  PROT 6.8  ALBUMIN 3.4*    Recent Labs Lab 06/06/13 0957  LIPASE 13   No results found for this basename: AMMONIA,  in the last 168 hours CBC:  Recent Labs Lab 06/06/13 0957  WBC 8.5  NEUTROABS 7.1  HGB 10.3*  HCT 29.6*  MCV 100.3*  PLT 177   Cardiac Enzymes: No results found for this basename: CKTOTAL, CKMB, CKMBINDEX, TROPONINI,  in the last 168 hours  BNP (last 3 results)  Recent Labs  03/23/13 1036  PROBNP 2180.0*   CBG: No results found for this basename: GLUCAP,  in the last 168 hours  Radiological Exams on Admission: Ct Abdomen Pelvis W Contrast  06/06/2013   CLINICAL DATA:  Rectal bleeding and abdominal pain.  EXAM: CT  ABDOMEN AND PELVIS WITH CONTRAST  TECHNIQUE: Multidetector CT imaging of the abdomen and pelvis was performed using the standard protocol following bolus administration of intravenous contrast.  CONTRAST:  48m OMNIPAQUE IOHEXOL 300 MG/ML  SOLN  COMPARISON:  None.  FINDINGS: There is a large hiatal hernia. There is diffuse diverticulosis of the distal colon without evidence of diverticulitis or bowel obstruction. No definite focal lesion of the bowel is identified.  The liver shows mild and diffuse steatosis. No focal hepatic lesions are identified. There is at least one dependent gallstone in the gallbladder. No biliary ductal dilatation is present. The pancreas is atrophic. Adrenal glands, kidneys and spleen are unremarkable.  The aorta, vessel branch vessels, iliac arteries and femoral arteries are diffusely and heavily calcified. No aneurysm is detected. No enlarged lymph nodes are seen.  In the pelvis, complex ventral hernia is identified beginning just above the pubic bone. Two components of hernia are identified with a small segment of the bladder in a right-sided component and portions of the colon in the left-sided component. There are no findings by CT to suggest overt incarceration.  The spine shows diffuse degenerative changes as well as evidence of multilevel prior vertebral augmentation.  IMPRESSION: 1. Hiatal hernia and colonic diverticulosis. No focal bowel lesion is identified by CT. 2. Complex lower pelvic ventral hernia containing portions of the bladder and colon without signs of incarceration.   Electronically Signed   By: GAletta EdouardM.D.   On: 06/06/2013 12:09     Assessment/Plan Active Problems:    Acute GI bleeding -Consult GI and surgery, appreciate their input -Protonix gtt, NPO, MIVF -Serial labs: CBC  DM -Insulin sliding scale  Hypertension -Hold medication due to soft blood pressure  Multiple myeloma -Patient to continue routine followup with oncologist once  discharged.   Code Status: DNR Family Communication: spoke with daughter at bedside regarding plan for hospitalization Disposition Plan: Pt admitted medical-surgical until for GI work-up and consultation.  Time spent: >60 min  VVelvet BatheTri[facility]ists Pager [phone]

## 2013-06-07 ENCOUNTER — Observation Stay (HOSPITAL_COMMUNITY): Payer: Medicare Other

## 2013-06-07 DIAGNOSIS — I5042 Chronic combined systolic (congestive) and diastolic (congestive) heart failure: Secondary | ICD-10-CM | POA: Diagnosis present

## 2013-06-07 DIAGNOSIS — I509 Heart failure, unspecified: Secondary | ICD-10-CM

## 2013-06-07 LAB — BASIC METABOLIC PANEL
BUN: 15 mg/dL (ref 6–23)
CO2: 28 meq/L (ref 19–32)
Calcium: 8.3 mg/dL — ABNORMAL LOW (ref 8.4–10.5)
Chloride: 95 mEq/L — ABNORMAL LOW (ref 96–112)
Creatinine, Ser: 0.76 mg/dL (ref 0.50–1.10)
GFR calc Af Amer: 87 mL/min — ABNORMAL LOW (ref >90)
GFR calc non Af Amer: 75 mL/min — ABNORMAL LOW (ref >90)
Glucose, Bld: 131 mg/dL — ABNORMAL HIGH (ref 70–99)
Potassium: 4.6 mEq/L (ref 3.7–5.3)
SODIUM: 132 meq/L — AB (ref 137–147)

## 2013-06-07 LAB — CBC
HCT: 22.5 % — ABNORMAL LOW (ref 36.0–46.0)
HCT: 19.5 % — ABNORMAL LOW (ref 36.0–46.0)
HCT: 27 % — ABNORMAL LOW (ref 36.0–46.0)
HEMOGLOBIN: 7.7 g/dL — AB (ref 12.0–15.0)
HEMOGLOBIN: 6.8 g/dL — AB (ref 12.0–15.0)
HEMOGLOBIN: 9.4 g/dL — AB (ref 12.0–15.0)
MCH: 34.7 pg — ABNORMAL HIGH (ref 26.0–34.0)
MCH: 34.9 pg — ABNORMAL HIGH (ref 26.0–34.0)
MCH: 32.5 pg (ref 26.0–34.0)
MCHC: 34.2 g/dL (ref 30.0–36.0)
MCHC: 34.9 g/dL (ref 30.0–36.0)
MCHC: 34.8 g/dL (ref 30.0–36.0)
MCV: 101.4 fL — ABNORMAL HIGH (ref 78.0–100.0)
MCV: 100 fL (ref 78.0–100.0)
MCV: 93.4 fL (ref 78.0–100.0)
PLATELETS: 129 10*3/uL — AB (ref 150–400)
Platelets: 163 10*3/uL (ref 150–400)
Platelets: 136 10*3/uL — ABNORMAL LOW (ref 150–400)
RBC: 2.22 MIL/uL — AB (ref 3.87–5.11)
RBC: 1.95 MIL/uL — AB (ref 3.87–5.11)
RBC: 2.89 MIL/uL — AB (ref 3.87–5.11)
RDW: 13.5 % (ref 11.5–15.5)
RDW: 13.4 % (ref 11.5–15.5)
RDW: 16.1 % — ABNORMAL HIGH (ref 11.5–15.5)
WBC: 7.8 10*3/uL (ref 4.0–10.5)
WBC: 5.5 10*3/uL (ref 4.0–10.5)
WBC: 6.3 10*3/uL (ref 4.0–10.5)

## 2013-06-07 LAB — PREPARE RBC (CROSSMATCH)

## 2013-06-07 LAB — GLUCOSE, CAPILLARY
GLUCOSE-CAPILLARY: 126 mg/dL — AB (ref 70–99)
GLUCOSE-CAPILLARY: 159 mg/dL — AB (ref 70–99)
GLUCOSE-CAPILLARY: 82 mg/dL (ref 70–99)
Glucose-Capillary: 161 mg/dL — ABNORMAL HIGH (ref 70–99)

## 2013-06-07 LAB — CEA: CEA: 4.3 ng/mL (ref 0.0–5.0)

## 2013-06-07 LAB — ABO/RH: ABO/RH(D): O POS

## 2013-06-07 MED ORDER — POLYETHYLENE GLYCOL 3350 17 G PO PACK
17.0000 g | PACK | Freq: Once | ORAL | Status: DC
  Filled 2013-06-07: qty 1

## 2013-06-07 MED ORDER — FUROSEMIDE 10 MG/ML IJ SOLN
20.0000 mg | Freq: Once | INTRAMUSCULAR | Status: AC
  Administered 2013-06-07: 20 mg via INTRAVENOUS
  Filled 2013-06-07: qty 2

## 2013-06-07 NOTE — Progress Notes (Signed)
Subjective: Going down for film, she is still a bit distended, says she's hungry, no other complaint.  Objective: Vital signs in last 24 hours: Temp:  [98 F (36.7 C)-98.3 F (36.8 C)] 98 F (36.7 C) (01/23 0418) Pulse Rate:  [84-92] 92 (01/23 0418) Resp:  [16-18] 18 (01/23 0418) BP: (91-118)/(40-53) 118/46 mmHg (01/23 0418) SpO2:  [89 %-100 %] 98 % (01/23 0418) Weight:  [44.634 kg (98 lb 6.4 oz)] 44.634 kg (98 lb 6.4 oz) (01/22 1828) Last BM Date:  (PTA) 240 PO, diet: NPO Afebrile, VSS She has dropped her Hbg from 10.3 yesterday to 7.7 this Am. NA down some  Intake/Output from previous day: 01/22 0701 - 01/23 0700 In: 240 [P.O.:240] Out: -  Intake/Output this shift:    General appearance: alert, cooperative and no distress GI: soft, still mildly distended, still tender with palpation.  +BS, no peritontis.  Lab Results:   Recent Labs  06/06/13 1921 06/07/13 0200  WBC 8.3 7.8  HGB 7.8* 7.7*  HCT 22.7* 22.5*  PLT 167 163    BMET  Recent Labs  06/06/13 0957 06/07/13 0200  NA 136* 132*  K 4.4 4.6  CL 95* 95*  CO2 30 28  GLUCOSE 174* 131*  BUN 14 15  CREATININE 0.69 0.76  CALCIUM 9.6 8.3*   PT/INR  Recent Labs  06/06/13 0957  LABPROT 13.9  INR 1.09     Recent Labs Lab 06/06/13 0957  AST 13  ALT 9  ALKPHOS 61  BILITOT 0.4  PROT 6.8  ALBUMIN 3.4*     Lipase     Component Value Date/Time   LIPASE 13 06/06/2013 0957     Studies/Results: Ct Abdomen Pelvis W Contrast  06/06/2013   CLINICAL DATA:  Rectal bleeding and abdominal pain.  EXAM: CT ABDOMEN AND PELVIS WITH CONTRAST  TECHNIQUE: Multidetector CT imaging of the abdomen and pelvis was performed using the standard protocol following bolus administration of intravenous contrast.  CONTRAST:  32m OMNIPAQUE IOHEXOL 300 MG/ML  SOLN  COMPARISON:  None.  FINDINGS: There is a large hiatal hernia. There is diffuse diverticulosis of the distal colon without evidence of diverticulitis or bowel  obstruction. No definite focal lesion of the bowel is identified.  The liver shows mild and diffuse steatosis. No focal hepatic lesions are identified. There is at least one dependent gallstone in the gallbladder. No biliary ductal dilatation is present. The pancreas is atrophic. Adrenal glands, kidneys and spleen are unremarkable.  The aorta, vessel branch vessels, iliac arteries and femoral arteries are diffusely and heavily calcified. No aneurysm is detected. No enlarged lymph nodes are seen.  In the pelvis, complex ventral hernia is identified beginning just above the pubic bone. Two components of hernia are identified with a small segment of the bladder in a right-sided component and portions of the colon in the left-sided component. There are no findings by CT to suggest overt incarceration.  The spine shows diffuse degenerative changes as well as evidence of multilevel prior vertebral augmentation.  IMPRESSION: 1. Hiatal hernia and colonic diverticulosis. No focal bowel lesion is identified by CT. 2. Complex lower pelvic ventral hernia containing portions of the bladder and colon without signs of incarceration.   Electronically Signed   By: GAletta EdouardM.D.   On: 06/06/2013 12:09    Medications: . insulin aspart  0-9 Units Subcutaneous TID WC  . [START ON 06/10/2013] pantoprazole (PROTONIX) IV  40 mg Intravenous Q12H    Assessment/Plan 1. Bloody stools  2. Abdominal pain with large hiatal hernia, ventral hernia with colon and bladder in it. Neither appear incarcerated.  3. Hx of colon cancer with colon resection 2003/[place]  4. Multiple myeloma with ongoing chemotherapy, last dose 05/29/13.  5. CAD; hx of MI 2002, CABG 2003/2D Echo 03/27/13 with EF 83-46%, grade 1 Diastolic dysfunction,Trival AR/Mod TR  6. AODM  7. Hypertension  8. Dyslipidemia  9. Chronic back pain  10. Fall last year with 3 fx ribs/pneumonia   Plan:  Film shows no obstruction, H/H is down with  follow but defer to Gi and Medicine.  LOS: 1 day    Christina Hopkins 06/07/2013

## 2013-06-07 NOTE — Progress Notes (Signed)
UR completed. Patient changed to inpatient-requiring IV protonix gtt and hgb monitoring.

## 2013-06-07 NOTE — Progress Notes (Signed)
[facility] PROGRESS NOTE  Christina Hopkins ZJI:[id number] DOB: [birth year] DOA: 06/06/2013 PCP: [name], MD  Assessment/Plan: Active Problems:   Acute GI bleeding  -Consulted GI and surgery, appreciate their input  -No plans for surgery or EGD per current notes. - hgb has decreased to 6.8 from 7.7. No active bleeding. Will transfuse 2 units of PRBC slowly with lasix 20 mg IV in between given history of diastolic/systolic CHF.  DM  -Insulin sliding scale  - Carb modified diet.  Hypertension  -Hold medication due to soft blood pressure   Multiple myeloma  -Patient to continue routine followup with oncologist once discharged.  Chronic systolic/diastolic CHF - compensated currently - Holding Ace I due to soft BP's as well as B blocker  Code Status: full Family Communication: None at bedside Disposition Plan: Per GI/Gen surg   Consultants:  GI  Gen surgery  Procedures:  none  Antibiotics:  None  HPI/Subjective: Pt has no new complaints. Denies any more active bleeding. No acute issues reported overnight.  Objective: Filed Vitals:   06/07/13 0418  BP: 118/46  Pulse: 92  Temp: 98 F (36.7 C)  Resp: 18    Intake/Output Summary (Last 24 hours) at 06/07/13 1007 Last data filed at 06/07/13 0419  Gross per 24 hour  Intake    240 ml  Output      0 ml  Net    240 ml   Filed Weights   06/06/13 1828  Weight: 44.634 kg (98 lb 6.4 oz)    Exam:   General:  Pt in NAD, Alert and awake  Cardiovascular: RRR, no MRG  Respiratory: CTA BL, no wheezes  Abdomen: Ventral hernia, NT, soft  Musculoskeletal: no cyanosis or clubbing   Data Reviewed: Basic Metabolic Panel:  Recent Labs Lab 06/06/13 0957 06/07/13 0200  NA 136* 132*  K 4.4 4.6  CL 95* 95*  CO2 30 28  GLUCOSE 174* 131*  BUN 14 15  CREATININE 0.69 0.76  CALCIUM 9.6 8.3*   Liver Function Tests:  Recent Labs Lab 06/06/13 0957  AST 13  ALT 9  ALKPHOS 61  BILITOT 0.4  PROT  6.8  ALBUMIN 3.4*    Recent Labs Lab 06/06/13 0957  LIPASE 13   No results found for this basename: AMMONIA,  in the last 168 hours CBC:  Recent Labs Lab 06/06/13 0957 06/06/13 1921 06/07/13 0200 06/07/13 0735  WBC 8.5 8.3 7.8 5.5  NEUTROABS 7.1  --   --   --   HGB 10.3* 7.8* 7.7* 6.8*  HCT 29.6* 22.7* 22.5* 19.5*  MCV 100.3* 100.9* 101.4* 100.0  PLT 177 167 163 136*   Cardiac Enzymes: No results found for this basename: CKTOTAL, CKMB, CKMBINDEX, TROPONINI,  in the last 168 hours BNP (last 3 results)  Recent Labs  03/23/13 1036  PROBNP 2180.0*   CBG:  Recent Labs Lab 06/06/13 2149 06/07/13 0857  GLUCAP 172* 126*    No results found for this or any previous visit (from the past 240 hour(s)).   Studies: Ct Abdomen Pelvis W Contrast  06/06/2013   CLINICAL DATA:  Rectal bleeding and abdominal pain.  EXAM: CT ABDOMEN AND PELVIS WITH CONTRAST  TECHNIQUE: Multidetector CT imaging of the abdomen and pelvis was performed using the standard protocol following bolus administration of intravenous contrast.  CONTRAST:  33m OMNIPAQUE IOHEXOL 300 MG/ML  SOLN  COMPARISON:  None.  FINDINGS: There is a large hiatal hernia. There is diffuse diverticulosis of the distal colon  without evidence of diverticulitis or bowel obstruction. No definite focal lesion of the bowel is identified.  The liver shows mild and diffuse steatosis. No focal hepatic lesions are identified. There is at least one dependent gallstone in the gallbladder. No biliary ductal dilatation is present. The pancreas is atrophic. Adrenal glands, kidneys and spleen are unremarkable.  The aorta, vessel branch vessels, iliac arteries and femoral arteries are diffusely and heavily calcified. No aneurysm is detected. No enlarged lymph nodes are seen.  In the pelvis, complex ventral hernia is identified beginning just above the pubic bone. Two components of hernia are identified with a small segment of the bladder in a  right-sided component and portions of the colon in the left-sided component. There are no findings by CT to suggest overt incarceration.  The spine shows diffuse degenerative changes as well as evidence of multilevel prior vertebral augmentation.  IMPRESSION: 1. Hiatal hernia and colonic diverticulosis. No focal bowel lesion is identified by CT. 2. Complex lower pelvic ventral hernia containing portions of the bladder and colon without signs of incarceration.   Electronically Signed   By: Aletta Edouard M.D.   On: 06/06/2013 12:09   Dg Abd 2 Views  06/07/2013   CLINICAL DATA:  Ventral hernia, rule out obstruction. Abdominal pain.  EXAM: ABDOMEN - 2 VIEW  COMPARISON:  CT abdomen and pelvis 06/06/2013 and abdominal radiographs 09/05/2012  FINDINGS: Residual oral contrast material is present throughout the colon. Colonic diverticulosis is noted. No air-fluid levels are identified. No intraperitoneal free air is seen. Distal descending/proximal sigmoid colon projects below the left pubis and ischium, corresponding to known hernia. No bowel dilatation suggestive of obstruction is identified. Extensive atherosclerotic vascular calcification is present. Prior vertebral cement augmentation is identified at T12 and L5. Surgical clips project in the central abdomen and left upper quadrant. Sternotomy wires are noted.  IMPRESSION: Progression of oral contrast material to the distal descending/proximal sigmoid colon in the region of the hernia. No bowel dilatation seen to suggest obstruction.   Electronically Signed   By: Logan Bores   On: 06/07/2013 08:14    Scheduled Meds: . furosemide  20 mg Intravenous Once  . insulin aspart  0-9 Units Subcutaneous TID WC  . [START ON 06/10/2013] pantoprazole (PROTONIX) IV  40 mg Intravenous Q12H  . polyethylene glycol  17 g Oral Once   Continuous Infusions: . dextrose 5 % and 0.45 % NaCl with KCl 20 mEq/L 75 mL/hr at 06/06/13 2032  . pantoprozole (PROTONIX) infusion 8 mg/hr  (06/07/13 0602)    Active Problems:   Multiple myeloma   Diabetes mellitus   Hypertension   GI bleed   Acute GI bleeding    Time spent: > 35 minutes    Velvet Bathe  [facility] Pager [phone] If 7PM-7AM, please contact night-coverage at [email or web address], password [facility] 06/07/2013, 10:07 AM  LOS: 1 day

## 2013-06-07 NOTE — Progress Notes (Signed)
Gen. Surgery:  I have personally interviewed and examined this patient this morning. I agree with the assessment and treatment plan outlined by Mr. Creig Hines, [place].  She is enjoying her breakfast. Denies nausea or vomiting. No further bleeding. No stools.  abdominal exam is fairly unremarkable. Soft. Hernias reducible.  Recommendations: There is no indication for surgical intervention for her reducible ventral incisional hernias, nor is there any indication for surgical intervention for her hiatal hernia. Her lower GI bleeding will be evaluated and managed by internal medicine and gastroenterologist. We will sign off. Please reconsult if surgical problems arise.   Edsel Petrin. Dalbert Batman, M.D., [facility] General and Minimally invasive Surgery Breast and Colorectal Surgery Office:   [phone] Pager:   [phone]

## 2013-06-07 NOTE — Clinical Documentation Improvement (Signed)
06/07/13    Possible Clinical Conditions?    Expected Acute Blood Loss Anemia  Acute Blood Loss Anemia  Acute on chronic blood loss anemia  Chronic blood loss anemia  Precipitous drop in Hematocrit  Other Condition________________  Cannot Clinically Determine   Risk Factors:multiple myeloma,Hx of colon cancer with colon resection 2003/   Supporting Information:  Signs and Symptoms: Bloody stools   Diagnostics: Component     Latest Ref Rng 06/06/2013 06/06/2013 06/07/2013 06/07/2013         9:57 AM  7:21 PM  2:00 AM  7:35 AM  Hemoglobin     12.0 - 15.0 g/dL 10.3 (L) 7.8 (L) 7.7 (L) 6.8 (LL)   Treatments: IVF , Monitor H&H, IV Protonix, Transfusion:Plan:  Transfuse packed red blood cells for hemoglobin of at least 8 or 9.  Consider colonoscopy     Thank You, Philippa Chester ,RN Clinical Documentation Specialist:  [place] Information Management

## 2013-06-07 NOTE — Progress Notes (Signed)
[facility] Progress Note  Subjective: Patient denies any further rectal bleeding or stools overnight despite drop in hemoglobin  Objective: Vital signs in last 24 hours: Temp:  [98 F (36.7 C)-98.2 F (36.8 C)] 98 F (36.7 C) (01/23 0418) Pulse Rate:  [84-92] 92 (01/23 0418) Resp:  [16-18] 18 (01/23 0418) BP: (91-118)/(40-47) 118/46 mmHg (01/23 0418) SpO2:  [89 %-100 %] 98 % (01/23 0418) Weight:  [44.634 kg (98 lb 6.4 oz)] 44.634 kg (98 lb 6.4 oz) (01/22 1828) Weight change:    PE: Alert, oriented, abdominal exam unchanged  Lab Results: Results for orders placed during the hospital encounter of 06/06/13 (from the past 24 hour(s))  CBC WITH DIFFERENTIAL     Status: Abnormal   Collection Time    06/06/13  9:57 AM      Result Value Range   WBC 8.5  4.0 - 10.5 K/uL   RBC 2.95 (*) 3.87 - 5.11 MIL/uL   Hemoglobin 10.3 (*) 12.0 - 15.0 g/dL   HCT 29.6 (*) 36.0 - 46.0 %   MCV 100.3 (*) 78.0 - 100.0 fL   MCH 34.9 (*) 26.0 - 34.0 pg   MCHC 34.8  30.0 - 36.0 g/dL   RDW 12.9  11.5 - 15.5 %   Platelets 177  150 - 400 K/uL   Neutrophils Relative % 84 (*) 43 - 77 %   Neutro Abs 7.1  1.7 - 7.7 K/uL   Lymphocytes Relative 10 (*) 12 - 46 %   Lymphs Abs 0.9  0.7 - 4.0 K/uL   Monocytes Relative 5  3 - 12 %   Monocytes Absolute 0.4  0.1 - 1.0 K/uL   Eosinophils Relative 1  0 - 5 %   Eosinophils Absolute 0.0  0.0 - 0.7 K/uL   Basophils Relative 0  0 - 1 %   Basophils Absolute 0.0  0.0 - 0.1 K/uL  COMPREHENSIVE METABOLIC PANEL     Status: Abnormal   Collection Time    06/06/13  9:57 AM      Result Value Range   Sodium 136 (*) 137 - 147 mEq/L   Potassium 4.4  3.7 - 5.3 mEq/L   Chloride 95 (*) 96 - 112 mEq/L   CO2 30  19 - 32 mEq/L   Glucose, Bld 174 (*) 70 - 99 mg/dL   BUN 14  6 - 23 mg/dL   Creatinine, Ser 0.69  0.50 - 1.10 mg/dL   Calcium 9.6  8.4 - 10.5 mg/dL   Total Protein 6.8  6.0 - 8.3 g/dL   Albumin 3.4 (*) 3.5 - 5.2 g/dL   AST 13  0 - 37 U/L   ALT 9  0 - 35  U/L   Alkaline Phosphatase 61  39 - 117 U/L   Total Bilirubin 0.4  0.3 - 1.2 mg/dL   GFR calc non Af Amer 77 (*) >90 mL/min   GFR calc Af Amer 89 (*) >90 mL/min  LIPASE, BLOOD     Status: None   Collection Time    06/06/13  9:57 AM      Result Value Range   Lipase 13  11 - 59 U/L  PROTIME-INR     Status: None   Collection Time    06/06/13  9:57 AM      Result Value Range   Prothrombin Time 13.9  11.6 - 15.2 seconds   INR 1.09  0.00 - 1.49  CG4 I-STAT (LACTIC ACID)     Status:  None   Collection Time    06/06/13 10:06 AM      Result Value Range   Lactic Acid, Venous 0.87  0.5 - 2.2 mmol/L  CBC     Status: Abnormal   Collection Time    06/06/13  7:21 PM      Result Value Range   WBC 8.3  4.0 - 10.5 K/uL   RBC 2.25 (*) 3.87 - 5.11 MIL/uL   Hemoglobin 7.8 (*) 12.0 - 15.0 g/dL   HCT 22.7 (*) 36.0 - 46.0 %   MCV 100.9 (*) 78.0 - 100.0 fL   MCH 34.7 (*) 26.0 - 34.0 pg   MCHC 34.4  30.0 - 36.0 g/dL   RDW 13.3  11.5 - 15.5 %   Platelets 167  150 - 400 K/uL  GLUCOSE, CAPILLARY     Status: Abnormal   Collection Time    06/06/13  9:49 PM      Result Value Range   Glucose-Capillary 172 (*) 70 - 99 mg/dL   Comment 1 Notify RN    BASIC METABOLIC PANEL     Status: Abnormal   Collection Time    06/07/13  2:00 AM      Result Value Range   Sodium 132 (*) 137 - 147 mEq/L   Potassium 4.6  3.7 - 5.3 mEq/L   Chloride 95 (*) 96 - 112 mEq/L   CO2 28  19 - 32 mEq/L   Glucose, Bld 131 (*) 70 - 99 mg/dL   BUN 15  6 - 23 mg/dL   Creatinine, Ser 0.76  0.50 - 1.10 mg/dL   Calcium 8.3 (*) 8.4 - 10.5 mg/dL   GFR calc non Af Amer 75 (*) >90 mL/min   GFR calc Af Amer 87 (*) >90 mL/min  CBC     Status: Abnormal   Collection Time    06/07/13  2:00 AM      Result Value Range   WBC 7.8  4.0 - 10.5 K/uL   RBC 2.22 (*) 3.87 - 5.11 MIL/uL   Hemoglobin 7.7 (*) 12.0 - 15.0 g/dL   HCT 22.5 (*) 36.0 - 46.0 %   MCV 101.4 (*) 78.0 - 100.0 fL   MCH 34.7 (*) 26.0 - 34.0 pg   MCHC 34.2  30.0 - 36.0 g/dL    RDW 13.5  11.5 - 15.5 %   Platelets 163  150 - 400 K/uL  CBC     Status: Abnormal   Collection Time    06/07/13  7:35 AM      Result Value Range   WBC 5.5  4.0 - 10.5 K/uL   RBC 1.95 (*) 3.87 - 5.11 MIL/uL   Hemoglobin 6.8 (*) 12.0 - 15.0 g/dL   HCT 19.5 (*) 36.0 - 46.0 %   MCV 100.0  78.0 - 100.0 fL   MCH 34.9 (*) 26.0 - 34.0 pg   MCHC 34.9  30.0 - 36.0 g/dL   RDW 13.4  11.5 - 15.5 %   Platelets 136 (*) 150 - 400 K/uL  GLUCOSE, CAPILLARY     Status: Abnormal   Collection Time    06/07/13  8:57 AM      Result Value Range   Glucose-Capillary 126 (*) 70 - 99 mg/dL   Comment 1 Documented in Chart     Comment 2 Notify RN      Studies/Results: Ct Abdomen Pelvis W Contrast  06/06/2013   CLINICAL DATA:  Rectal bleeding and abdominal pain.  EXAM: CT ABDOMEN AND PELVIS WITH CONTRAST  TECHNIQUE: Multidetector CT imaging of the abdomen and pelvis was performed using the standard protocol following bolus administration of intravenous contrast.  CONTRAST:  40mL OMNIPAQUE IOHEXOL 300 MG/ML  SOLN  COMPARISON:  None.  FINDINGS: There is a large hiatal hernia. There is diffuse diverticulosis of the distal colon without evidence of diverticulitis or bowel obstruction. No definite focal lesion of the bowel is identified.  The liver shows mild and diffuse steatosis. No focal hepatic lesions are identified. There is at least one dependent gallstone in the gallbladder. No biliary ductal dilatation is present. The pancreas is atrophic. Adrenal glands, kidneys and spleen are unremarkable.  The aorta, vessel branch vessels, iliac arteries and femoral arteries are diffusely and heavily calcified. No aneurysm is detected. No enlarged lymph nodes are seen.  In the pelvis, complex ventral hernia is identified beginning just above the pubic bone. Two components of hernia are identified with a small segment of the bladder in a right-sided component and portions of the colon in the left-sided component. There are no  findings by CT to suggest overt incarceration.  The spine shows diffuse degenerative changes as well as evidence of multilevel prior vertebral augmentation.  IMPRESSION: 1. Hiatal hernia and colonic diverticulosis. No focal bowel lesion is identified by CT. 2. Complex lower pelvic ventral hernia containing portions of the bladder and colon without signs of incarceration.   Electronically Signed   By: Aletta Edouard M.D.   On: 06/06/2013 12:09   Dg Abd 2 Views  06/07/2013   CLINICAL DATA:  Ventral hernia, rule out obstruction. Abdominal pain.  EXAM: ABDOMEN - 2 VIEW  COMPARISON:  CT abdomen and pelvis 06/06/2013 and abdominal radiographs 09/05/2012  FINDINGS: Residual oral contrast material is present throughout the colon. Colonic diverticulosis is noted. No air-fluid levels are identified. No intraperitoneal free air is seen. Distal descending/proximal sigmoid colon projects below the left pubis and ischium, corresponding to known hernia. No bowel dilatation suggestive of obstruction is identified. Extensive atherosclerotic vascular calcification is present. Prior vertebral cement augmentation is identified at T12 and L5. Surgical clips project in the central abdomen and left upper quadrant. Sternotomy wires are noted.  IMPRESSION: Progression of oral contrast material to the distal descending/proximal sigmoid colon in the region of the hernia. No bowel dilatation seen to suggest obstruction.   Electronically Signed   By: Logan Bores   On: 06/07/2013 08:14      Assessment: Rectal bleeding probably diverticular with large complex abdominal wall hernia involving bladder and colon, significant drop in hemoglobin but hemodynamically stable  Plan: Transfuse packed red blood cells for hemoglobin of at least 8 or 9. Continue to monitor stools and hemoglobin. Consider colonoscopy which will be difficult given her anatomy versus expectant management if bleeding stops.Marland Kitchen    Zyquan Crotty C 06/07/2013, 9:11  AM

## 2013-06-08 DIAGNOSIS — K922 Gastrointestinal hemorrhage, unspecified: Secondary | ICD-10-CM

## 2013-06-08 DIAGNOSIS — C9000 Multiple myeloma not having achieved remission: Secondary | ICD-10-CM

## 2013-06-08 DIAGNOSIS — S2249XA Multiple fractures of ribs, unspecified side, initial encounter for closed fracture: Secondary | ICD-10-CM

## 2013-06-08 DIAGNOSIS — E785 Hyperlipidemia, unspecified: Secondary | ICD-10-CM

## 2013-06-08 DIAGNOSIS — E119 Type 2 diabetes mellitus without complications: Secondary | ICD-10-CM

## 2013-06-08 DIAGNOSIS — R109 Unspecified abdominal pain: Secondary | ICD-10-CM

## 2013-06-08 LAB — GLUCOSE, CAPILLARY
GLUCOSE-CAPILLARY: 105 mg/dL — AB (ref 70–99)
Glucose-Capillary: 151 mg/dL — ABNORMAL HIGH (ref 70–99)
Glucose-Capillary: 99 mg/dL (ref 70–99)
Glucose-Capillary: 162 mg/dL — ABNORMAL HIGH (ref 70–99)

## 2013-06-08 MED ORDER — VITAMINS A & D EX OINT
TOPICAL_OINTMENT | CUTANEOUS | Status: AC
  Filled 2013-06-08: qty 5

## 2013-06-08 NOTE — Progress Notes (Signed)
[facility] PROGRESS NOTE  Christina Hopkins:[id number] DOB: 1926/03/07 DOA: 06/06/2013 PCP: [name], MD  Assessment/Plan: Active Problems:   Acute GI bleeding  - Awaiting plans from specialist on board. - hgb was 6.8 and patient received 2 units of PRBC's.  Repeat value 9.4 after transfusion. - Will monitor for one more day to make sure hgb stays steady at that level.  DM  -Insulin sliding scale  - Carb modified diet.  Hypertension  -Hold medication due to soft blood pressure   Multiple myeloma  -Patient to continue routine followup with oncologist once discharged.  Chronic systolic/diastolic CHF - compensated currently - Holding Ace I and B blocker due to soft BP's  Code Status: full Family Communication: None at bedside Disposition Plan: Per GI/Gen surg   Consultants:  GI  Gen surgery  Procedures:  none  Antibiotics:  None  HPI/Subjective: Pt has no new complaints. States she feels better. Reports no more bleeding at this time.  Objective: Filed Vitals:   06/08/13 0515  BP: 124/52  Pulse: 82  Temp: 98 F (36.7 C)  Resp: 19    Intake/Output Summary (Last 24 hours) at 06/08/13 0902 Last data filed at 06/08/13 0600  Gross per 24 hour  Intake   1450 ml  Output   1300 ml  Net    150 ml   Filed Weights   06/06/13 1828  Weight: 44.634 kg (98 lb 6.4 oz)    Exam:   General:  Pt in NAD, Alert and awake  Cardiovascular: RRR, no MRG  Respiratory: CTA BL, no wheezes  Abdomen: Ventral hernia, NT, soft  Musculoskeletal: no cyanosis or clubbing   Data Reviewed: Basic Metabolic Panel:  Recent Labs Lab 06/06/13 0957 06/07/13 0200  NA 136* 132*  K 4.4 4.6  CL 95* 95*  CO2 30 28  GLUCOSE 174* 131*  BUN 14 15  CREATININE 0.69 0.76  CALCIUM 9.6 8.3*   Liver Function Tests:  Recent Labs Lab 06/06/13 0957  AST 13  ALT 9  ALKPHOS 61  BILITOT 0.4  PROT 6.8  ALBUMIN 3.4*    Recent Labs Lab 06/06/13 0957  LIPASE  13   No results found for this basename: AMMONIA,  in the last 168 hours CBC:  Recent Labs Lab 06/06/13 0957 06/06/13 1921 06/07/13 0200 06/07/13 0735 06/07/13 2102  WBC 8.5 8.3 7.8 5.5 6.3  NEUTROABS 7.1  --   --   --   --   HGB 10.3* 7.8* 7.7* 6.8* 9.4*  HCT 29.6* 22.7* 22.5* 19.5* 27.0*  MCV 100.3* 100.9* 101.4* 100.0 93.4  PLT 177 167 163 136* 129*   Cardiac Enzymes: No results found for this basename: CKTOTAL, CKMB, CKMBINDEX, TROPONINI,  in the last 168 hours BNP (last 3 results)  Recent Labs  03/23/13 1036  PROBNP 2180.0*   CBG:  Recent Labs Lab 06/07/13 0857 06/07/13 1200 06/07/13 1651 06/07/13 2122 06/08/13 0739  GLUCAP 126* 159* 82 161* 151*    No results found for this or any previous visit (from the past 240 hour(s)).   Studies: Ct Abdomen Pelvis W Contrast  06/06/2013   CLINICAL DATA:  Rectal bleeding and abdominal pain.  EXAM: CT ABDOMEN AND PELVIS WITH CONTRAST  TECHNIQUE: Multidetector CT imaging of the abdomen and pelvis was performed using the standard protocol following bolus administration of intravenous contrast.  CONTRAST:  32m OMNIPAQUE IOHEXOL 300 MG/ML  SOLN  COMPARISON:  None.  FINDINGS: There is a large hiatal hernia. There  is diffuse diverticulosis of the distal colon without evidence of diverticulitis or bowel obstruction. No definite focal lesion of the bowel is identified.  The liver shows mild and diffuse steatosis. No focal hepatic lesions are identified. There is at least one dependent gallstone in the gallbladder. No biliary ductal dilatation is present. The pancreas is atrophic. Adrenal glands, kidneys and spleen are unremarkable.  The aorta, vessel branch vessels, iliac arteries and femoral arteries are diffusely and heavily calcified. No aneurysm is detected. No enlarged lymph nodes are seen.  In the pelvis, complex ventral hernia is identified beginning just above the pubic bone. Two components of hernia are identified with a small  segment of the bladder in a right-sided component and portions of the colon in the left-sided component. There are no findings by CT to suggest overt incarceration.  The spine shows diffuse degenerative changes as well as evidence of multilevel prior vertebral augmentation.  IMPRESSION: 1. Hiatal hernia and colonic diverticulosis. No focal bowel lesion is identified by CT. 2. Complex lower pelvic ventral hernia containing portions of the bladder and colon without signs of incarceration.   Electronically Signed   By: Aletta Edouard M.D.   On: 06/06/2013 12:09   Dg Abd 2 Views  06/07/2013   CLINICAL DATA:  Ventral hernia, rule out obstruction. Abdominal pain.  EXAM: ABDOMEN - 2 VIEW  COMPARISON:  CT abdomen and pelvis 06/06/2013 and abdominal radiographs 09/05/2012  FINDINGS: Residual oral contrast material is present throughout the colon. Colonic diverticulosis is noted. No air-fluid levels are identified. No intraperitoneal free air is seen. Distal descending/proximal sigmoid colon projects below the left pubis and ischium, corresponding to known hernia. No bowel dilatation suggestive of obstruction is identified. Extensive atherosclerotic vascular calcification is present. Prior vertebral cement augmentation is identified at T12 and L5. Surgical clips project in the central abdomen and left upper quadrant. Sternotomy wires are noted.  IMPRESSION: Progression of oral contrast material to the distal descending/proximal sigmoid colon in the region of the hernia. No bowel dilatation seen to suggest obstruction.   Electronically Signed   By: Logan Bores   On: 06/07/2013 08:14    Scheduled Meds: . insulin aspart  0-9 Units Subcutaneous TID WC  . [START ON 06/10/2013] pantoprazole (PROTONIX) IV  40 mg Intravenous Q12H  . polyethylene glycol  17 g Oral Once  . vitamin A & D       Continuous Infusions: . dextrose 5 % and 0.45 % NaCl with KCl 20 mEq/L 75 mL/hr at 06/08/13 0600  . pantoprozole (PROTONIX)  infusion 8 mg/hr (06/08/13 0600)    Active Problems:   Multiple myeloma   Diabetes mellitus   Hypertension   GI bleed   Acute GI bleeding   Chronic combined systolic and diastolic congestive heart failure    Time spent: > 35 minutes    Velvet Bathe  [facility] Pager [phone] If 7PM-7AM, please contact night-coverage at [email or web address], password [facility] 06/08/2013, 9:02 AM  LOS: 2 days

## 2013-06-08 NOTE — Progress Notes (Signed)
Collins R Chim 12:25 PM  Subjective: Patient doing better with only minimal old blood on her pad but no signs of active bleeding and eating regular food and no new complaint  Objective: Vital signs stable afebrile no acute distress abdomen is nontender obvious hernia hemoglobin increased status post transfusion  Assessment: Probable diverticular bleed  Plan: She will need to discuss aspirin use with her cardiologist who is in [place] and the daughter will call him on Monday otherwise hopefully can home soon and I'm happy to see back when necessary and with her history of colon cancer it might be worthwhile proceeding with one more attempt at colonoscopy as an outpatient and they tell me her last one was done in [place] possibly 7-8 years ago [facility] E

## 2013-06-08 NOTE — Evaluation (Signed)
Physical Therapy Evaluation Patient Details Name: Christina Hopkins MRN: [id number] DOB: 1925/10/03 Today's Date: 06/08/2013 Time: 8841-6606 PT Time Calculation (min): 20 min  PT Assessment / Plan / Recommendation History of Present Illness  80 yo femaled admitted with bright red blood in her stool.  She says she has multiple myeloma and is on chemotherapy.  She had a fall in novemeber followed by skilled nursing facility rehab and HHPT,just discharged about a week ago.   Pt also reports she has DM and high blood pressure but she has that under control  Clinical Impression  Pt needs supervision/occasional min guard for bed mobility and gait.  She may benefit from continued HHPT as she is alone during the day, but HHPT has just been discontinued.  She and daughter will discuss if they want it to resume. Anticipate pt will continue to feel stronger as she recovers.    PT Assessment  Patient needs continued PT services    Follow Up Recommendations  Home health PT (if patient/family agree as pt alone in the day)    Does the patient have the potential to tolerate intense rehabilitation      Barriers to Discharge        Equipment Recommendations  None recommended by PT    Recommendations for Other Services     Frequency Min 3X/week    Precautions / Restrictions     Pertinent Vitals/Pain C/o mild abdominal pain      Mobility  Bed Mobility Overal bed mobility: Needs Assistance Bed Mobility: Supine to Sit Supine to sit: Min assist Transfers Overall transfer level: Needs assistance Transfers: Sit to/from Stand Sit to Stand: Min guard Ambulation/Gait Ambulation/Gait assistance: Supervision Ambulation Distance (Feet): 75 Feet Assistive device: Rolling walker (2 wheeled) Gait Pattern/deviations: Step-through pattern;Trunk flexed Gait velocity interpretation: at or above normal speed for age/gender General Gait Details: pt states her legs feel a little weaker, but she is able to use  RW well    Exercises     PT Diagnosis: Generalized weakness  PT Problem List: Decreased strength;Decreased activity tolerance PT Treatment Interventions: Gait training;Stair training;Functional mobility training     PT Goals(Current goals can be found in the care plan section) Acute Rehab PT Goals Patient Stated Goal: to go back home PT Goal Formulation: With patient Time For Goal Achievement: 06/15/13 Potential to Achieve Goals: Good  Visit Information  Last PT Received On: 06/08/13 History of Present Illness: 80 yo femaled admitted with bright red blood in her stool.  She says she has multiple myeloma and is on chemotherapy.  She had a fall in novemeber followed by skilled nursing facility rehab and HHPT,just discharged about a week ago.   Pt also reports she has DM and high blood pressure but she has that under control       Prior [place] expects to be discharged to:: Private residence Living Arrangements: Children Type of Home: House Home Access: Stairs to enter Technical brewer of Steps: 2 Entrance Stairs-Rails: Left Home Layout: One [place]: [place] - 2 wheels;Cane - quad;Wheelchair - manual;Bedside commode;Shower seat;Grab bars - tub/shower Additional Comments: Pt states lives with daughter who works during the day Prior Function Level of Independence: Independent with assistive device(s) (quad cane) Comments: cane or RW Communication Communication: No difficulties Dominant Hand: Left    Cognition  Cognition Arousal/Alertness: Awake/alert Behavior During Therapy: WFL for tasks assessed/performed    Extremity/Trunk Assessment Upper Extremity Assessment Upper Extremity Assessment: Defer to OT evaluation  Lower Extremity Assessment Lower Extremity Assessment: Overall WFL for tasks assessed (pt reports her legs feel weaker than they were generalized muscle atrophy) Cervical / Trunk Assessment Cervical / Trunk  Assessment: Kyphotic (severe kyphosis with forward head)   Balance Balance Overall balance assessment: No apparent balance deficits (not formally assessed);Modified Independent  End of Session PT - End of Session Activity Tolerance: Patient tolerated treatment well Patient left: in chair;with family/visitor present Nurse Communication: Mobility status  GP    Donato Heinz. [place], [place] 06/08/2013, 11:42 AM

## 2013-06-09 DIAGNOSIS — E876 Hypokalemia: Secondary | ICD-10-CM

## 2013-06-09 LAB — CBC
HCT: 25.3 % — ABNORMAL LOW (ref 36.0–46.0)
Hemoglobin: 8.6 g/dL — ABNORMAL LOW (ref 12.0–15.0)
MCH: 32.1 pg (ref 26.0–34.0)
MCHC: 34 g/dL (ref 30.0–36.0)
MCV: 94.4 fL (ref 78.0–100.0)
PLATELETS: 157 10*3/uL (ref 150–400)
RBC: 2.68 MIL/uL — ABNORMAL LOW (ref 3.87–5.11)
RDW: 16.1 % — ABNORMAL HIGH (ref 11.5–15.5)
WBC: 4.6 10*3/uL (ref 4.0–10.5)

## 2013-06-09 LAB — GLUCOSE, CAPILLARY
GLUCOSE-CAPILLARY: 119 mg/dL — AB (ref 70–99)
Glucose-Capillary: 155 mg/dL — ABNORMAL HIGH (ref 70–99)
Glucose-Capillary: 226 mg/dL — ABNORMAL HIGH (ref 70–99)
Glucose-Capillary: 118 mg/dL — ABNORMAL HIGH (ref 70–99)

## 2013-06-09 NOTE — Progress Notes (Signed)
Arlys R Rampy 12:01 PM  Subjective: Patient doing well without any complaints and denies any bleeding today  Objective: Vital signs stable afebrile no acute distress abdomen is a little tender around her hernia but soft hemoglobin slight decrease from 2 days ago Assessment: Probable diverticular bleeding  Plan: Please let me know if I can be of any further assistance and happy to see back as an outpatient if needed  [facility] E

## 2013-06-09 NOTE — Progress Notes (Signed)
[facility] PROGRESS NOTE  Christina Hopkins KGY:[id number] DOB: 10-19-1925 DOA: 06/06/2013 PCP: [name], MD  Assessment/Plan: Active Problems:   Acute GI bleeding  - Per GI-rectal bleeding probably diverticular with large complex abdominal wall hernia. Plan for colonoscopy as an outpatient. Pt to discuss aspirin use with cardiologist. - hgb 8.6 and Hct 25.3 on 1/25. Patient received 2 units of PRBC's on 1/23.  Repeat CBC in AM. - Will monitor for one more day to make sure hgb stays steady.  DM  - Insulin sliding scale  - Carb modified diet.  Hypertension  - Hold medication due to soft blood pressure   Multiple myeloma  -Patient to continue routine followup with oncologist once discharged.  Chronic systolic/diastolic CHF - compensated currently - Holding Ace I and B blocker due to soft BP's  Code Status: DNR Family Communication: None at bedside. Pt updated on plan of care. Disposition Plan: Per GI/Gen surg and Hbg/Hct in AM.   Consultants:  GI  Gen surgery  Procedures:  none  Antibiotics:  None  HPI/Subjective: Pt has no new complaints. States she feels better. Reports small amount of dark blood in stool this AM.  Objective: Filed Vitals:   06/09/13 1330  BP: 147/67  Pulse: 117  Temp: 98.5 F (36.9 C)  Resp: 18    Intake/Output Summary (Last 24 hours) at 06/09/13 1539 Last data filed at 06/09/13 1300  Gross per 24 hour  Intake 2301.67 ml  Output   1300 ml  Net 1001.67 ml   Filed Weights   06/06/13 1828  Weight: 44.634 kg (98 lb 6.4 oz)    Exam:   General:  Pt in NAD, Alert and awake  Cardiovascular: RRR, no MRG  Respiratory: CTA BL, no wheezes  Abdomen: Ventral hernia, NT, soft  Musculoskeletal: no cyanosis, edema, or clubbing   Data Reviewed: Basic Metabolic Panel:  Recent Labs Lab 06/06/13 0957 06/07/13 0200  NA 136* 132*  K 4.4 4.6  CL 95* 95*  CO2 30 28  GLUCOSE 174* 131*  BUN 14 15  CREATININE 0.69 0.76   CALCIUM 9.6 8.3*   Liver Function Tests:  Recent Labs Lab 06/06/13 0957  AST 13  ALT 9  ALKPHOS 61  BILITOT 0.4  PROT 6.8  ALBUMIN 3.4*    Recent Labs Lab 06/06/13 0957  LIPASE 13   No results found for this basename: AMMONIA,  in the last 168 hours CBC:  Recent Labs Lab 06/06/13 0957 06/06/13 1921 06/07/13 0200 06/07/13 0735 06/07/13 2102 06/09/13 0924  WBC 8.5 8.3 7.8 5.5 6.3 4.6  NEUTROABS 7.1  --   --   --   --   --   HGB 10.3* 7.8* 7.7* 6.8* 9.4* 8.6*  HCT 29.6* 22.7* 22.5* 19.5* 27.0* 25.3*  MCV 100.3* 100.9* 101.4* 100.0 93.4 94.4  PLT 177 167 163 136* 129* 157   Cardiac Enzymes: No results found for this basename: CKTOTAL, CKMB, CKMBINDEX, TROPONINI,  in the last 168 hours BNP (last 3 results)  Recent Labs  03/23/13 1036  PROBNP 2180.0*   CBG:  Recent Labs Lab 06/08/13 1201 06/08/13 1625 06/08/13 2229 06/09/13 0736 06/09/13 1158  GLUCAP 99 162* 105* 155* 119*    No results found for this or any previous visit (from the past 240 hour(s)).   Studies: No results found.  Scheduled Meds: . insulin aspart  0-9 Units Subcutaneous TID WC  . [START ON 06/10/2013] pantoprazole (PROTONIX) IV  40 mg Intravenous Q12H  .  polyethylene glycol  17 g Oral Once   Continuous Infusions: . dextrose 5 % and 0.45 % NaCl with KCl 20 mEq/L 75 mL/hr at 06/08/13 0600  . pantoprozole (PROTONIX) infusion 8 mg/hr (06/09/13 0348)    Active Problems:   Multiple myeloma   Diabetes mellitus   Hypertension   GI bleed   Acute GI bleeding   Chronic combined systolic and diastolic congestive heart failure    Time spent: > 35 minutes    Larwance Sachs  [facility] Pager [phone] If 7PM-7AM, please contact night-coverage at [email or web address], password [facility] 06/09/2013, 3:39 PM  LOS: 3 days

## 2013-06-10 ENCOUNTER — Telehealth: Payer: Self-pay | Admitting: Medical Oncology

## 2013-06-10 ENCOUNTER — Other Ambulatory Visit: Payer: Self-pay | Admitting: Medical Oncology

## 2013-06-10 DIAGNOSIS — S2249XA Multiple fractures of ribs, unspecified side, initial encounter for closed fracture: Secondary | ICD-10-CM

## 2013-06-10 DIAGNOSIS — D638 Anemia in other chronic diseases classified elsewhere: Secondary | ICD-10-CM

## 2013-06-10 DIAGNOSIS — E871 Hypo-osmolality and hyponatremia: Secondary | ICD-10-CM

## 2013-06-10 DIAGNOSIS — J189 Pneumonia, unspecified organism: Secondary | ICD-10-CM

## 2013-06-10 LAB — CBC
HCT: 22.7 % — ABNORMAL LOW (ref 36.0–46.0)
HCT: 34.5 % — ABNORMAL LOW (ref 36.0–46.0)
HEMOGLOBIN: 11.9 g/dL — AB (ref 12.0–15.0)
Hemoglobin: 7.7 g/dL — ABNORMAL LOW (ref 12.0–15.0)
MCH: 32.5 pg (ref 26.0–34.0)
MCH: 31.7 pg (ref 26.0–34.0)
MCHC: 33.9 g/dL (ref 30.0–36.0)
MCHC: 34.5 g/dL (ref 30.0–36.0)
MCV: 95.8 fL (ref 78.0–100.0)
MCV: 92 fL (ref 78.0–100.0)
PLATELETS: 163 10*3/uL (ref 150–400)
Platelets: 176 10*3/uL (ref 150–400)
RBC: 2.37 MIL/uL — ABNORMAL LOW (ref 3.87–5.11)
RBC: 3.75 MIL/uL — AB (ref 3.87–5.11)
RDW: 16.1 % — AB (ref 11.5–15.5)
RDW: 15.9 % — ABNORMAL HIGH (ref 11.5–15.5)
WBC: 3.9 10*3/uL — ABNORMAL LOW (ref 4.0–10.5)
WBC: 5.9 10*3/uL (ref 4.0–10.5)

## 2013-06-10 LAB — GLUCOSE, CAPILLARY
GLUCOSE-CAPILLARY: 90 mg/dL (ref 70–99)
GLUCOSE-CAPILLARY: 123 mg/dL — AB (ref 70–99)
Glucose-Capillary: 142 mg/dL — ABNORMAL HIGH (ref 70–99)
Glucose-Capillary: 167 mg/dL — ABNORMAL HIGH (ref 70–99)

## 2013-06-10 LAB — PREPARE RBC (CROSSMATCH)

## 2013-06-10 MED ORDER — FUROSEMIDE 10 MG/ML IJ SOLN
20.0000 mg | Freq: Once | INTRAMUSCULAR | Status: AC
  Administered 2013-06-10: 20 mg via INTRAVENOUS
  Filled 2013-06-10: qty 2

## 2013-06-10 MED ORDER — PANTOPRAZOLE SODIUM 40 MG PO TBEC
40.0000 mg | DELAYED_RELEASE_TABLET | Freq: Every day | ORAL | Status: DC
  Administered 2013-06-10 – 2013-06-14 (×5): 40 mg via ORAL
  Filled 2013-06-10 (×4): qty 1

## 2013-06-10 NOTE — Progress Notes (Signed)
Christina Hopkins 12:31 PM  Subjective: Today the patient admits to seeing blood in her bowel and in talking to the nurse she had some yesterday as well but she has no other complaints  Objective: Vital signs stable afebrile no acute distress abdomen is soft nontender obvious hernia hemoglobin has continued to drop  Assessment: Lower GI bleeding  Plan: Will change her back to clear liquids and if she continues to bleed we will prep her tomorrow for a colonoscopy on Wednesday and if signs of bleeding increases might consider a nuclear bleeding scan although she does not seem to be bleeding rapidly enough for that plus with her large ventral hernia interpreting it would be difficult  Christina Hopkins

## 2013-06-10 NOTE — Progress Notes (Signed)
[facility] PROGRESS NOTE  SEE BEHARRY ZOX:[id number] DOB: [birth year] DOA: 06/06/2013 PCP: [name], MD  Assessment/Plan: Active Problems:   Acute GI bleeding  - Per GI-rectal bleeding probably diverticular with large complex abdominal wall hernia.  - The patient's hemoglobin continues to trend down. I discussed this with patient and discuss it given that she has been noticing blood in her stools and her downward trend of her hemoglobin I recommended blood transfusion. Patient is agreeable. - GI on board and currently making recommendations.  DM  - Insulin sliding scale  - Carb modified diet.  Hypertension  - Well controlled off of home blood pressure medication. Will continue to monitor   Multiple myeloma  -Patient to continue routine followup with oncologist once discharged.  Chronic systolic/diastolic CHF - compensated currently - Holding Ace I and B blocker due to soft BP's  Code Status: DNR Family Communication: None at bedside. Pt updated on plan of care. Disposition Plan: Per GI/Gen surg and Hbg/Hct in AM.   Consultants:  GI  Gen surgery  Procedures:  none  Antibiotics:  None  HPI/Subjective: Pt has no new complaints.  Reports small amount of dark blood in.  Objective: Filed Vitals:   06/10/13 1500  BP: 109/53  Pulse: 103  Temp: 98.3 F (36.8 C)  Resp: 20    Intake/Output Summary (Last 24 hours) at 06/10/13 1541 Last data filed at 06/10/13 1301  Gross per 24 hour  Intake 2226.25 ml  Output      0 ml  Net 2226.25 ml   Filed Weights   06/06/13 1828  Weight: 44.634 kg (98 lb 6.4 oz)    Exam:   General:  Pt in NAD, Alert and awake  Cardiovascular: RRR, no MRG  Respiratory: CTA BL, no wheezes  Abdomen: Ventral hernia, NT, soft  Musculoskeletal: no cyanosis, edema, or clubbing   Data Reviewed: Basic Metabolic Panel:  Recent Labs Lab 06/06/13 0957 06/07/13 0200  NA 136* 132*  K 4.4 4.6  CL 95* 95*  CO2 30 28   GLUCOSE 174* 131*  BUN 14 15  CREATININE 0.69 0.76  CALCIUM 9.6 8.3*   Liver Function Tests:  Recent Labs Lab 06/06/13 0957  AST 13  ALT 9  ALKPHOS 61  BILITOT 0.4  PROT 6.8  ALBUMIN 3.4*    Recent Labs Lab 06/06/13 0957  LIPASE 13   No results found for this basename: AMMONIA,  in the last 168 hours CBC:  Recent Labs Lab 06/06/13 0957  06/07/13 0200 06/07/13 0735 06/07/13 2102 06/09/13 0924 06/10/13 0450  WBC 8.5  < > 7.8 5.5 6.3 4.6 3.9*  NEUTROABS 7.1  --   --   --   --   --   --   HGB 10.3*  < > 7.7* 6.8* 9.4* 8.6* 7.7*  HCT 29.6*  < > 22.5* 19.5* 27.0* 25.3* 22.7*  MCV 100.3*  < > 101.4* 100.0 93.4 94.4 95.8  PLT 177  < > 163 136* 129* 157 163  < > = values in this interval not displayed. Cardiac Enzymes: No results found for this basename: CKTOTAL, CKMB, CKMBINDEX, TROPONINI,  in the last 168 hours BNP (last 3 results)  Recent Labs  03/23/13 1036  PROBNP 2180.0*   CBG:  Recent Labs Lab 06/09/13 1158 06/09/13 1653 06/09/13 2147 06/10/13 0757 06/10/13 1202  GLUCAP 119* 226* 118* 142* 90    No results found for this or any previous visit (from the past 240 hour(s)).  Studies: No results found.  Scheduled Meds: . insulin aspart  0-9 Units Subcutaneous TID WC  . pantoprazole  40 mg Oral Daily  . polyethylene glycol  17 g Oral Once   Continuous Infusions: . dextrose 5 % and 0.45 % NaCl with KCl 20 mEq/L 75 mL/hr at 06/10/13 0700    Active Problems:   Multiple myeloma   Diabetes mellitus   Hypertension   GI bleed   Acute GI bleeding   Chronic combined systolic and diastolic congestive heart failure    Time spent: > 35 minutes    Velvet Bathe  [facility] Pager [phone] If 7PM-7AM, please contact night-coverage at [email or web address], password [facility] 06/10/2013, 3:41 PM  LOS: 4 days

## 2013-06-10 NOTE — Telephone Encounter (Signed)
Christina Hopkins called to inform us that her mother is still in the hospital. She will get 2 units of blood today and then she will be discharged home. She has appointments in our office 06/12/13 for lab and chemo. She usually gets aranesp. She does not feel her mother will be up to coming in for her treatment. She asked if Dr. Juliann Mule could write an order for her to get her aranesp before she is discharged home. I will discuss with Dr. Juliann Mule and call her back.

## 2013-06-10 NOTE — Progress Notes (Signed)
Physical Therapy Treatment Patient Details Name: Toshi R Newcom MRN: [id number] DOB: 06/26/1925 Today's Date: 06/10/2013 Time: 0904-0920 PT Time Calculation (min): 16 min  PT Assessment / Plan / Recommendation  History of Present Illness 80 yo femaled admitted with bright red blood in her stool.  She says she has multiple myeloma and is on chemotherapy.  She had a fall in novemeber followed by skilled nursing facility rehab and HHPT,just discharged about a week ago.   Pt also reports she has DM and high blood pressure but she has that under control   PT Comments   Assisted pt OOb to amb in hallway.  Pt feeling better and hopes to go home soon.  Follow Up Recommendations  Home health PT     Does the patient have the potential to tolerate intense rehabilitation     Barriers to Discharge        Equipment Recommendations  None recommended by PT    Recommendations for Other Services    Frequency Min 3X/week   Progress towards PT Goals Progress towards PT goals: Progressing toward goals  Plan      Precautions / Restrictions Precautions Precautions: Fall Restrictions Weight Bearing Restrictions: No    Pertinent Vitals/Pain No c/o pain    Mobility  Bed Mobility Overal bed mobility: Modified Independent Bed Mobility: Sit to Supine Sit to supine: Min guard General bed mobility comments: increased time Transfers Overall transfer level: Needs assistance Equipment used: Rolling walker (2 wheeled) Transfers: Sit to/from Stand Sit to Stand: Supervision;Min guard General transfer comment: increased time Ambulation/Gait Ambulation/Gait assistance: Supervision;Min guard Ambulation Distance (Feet): 145 Feet Assistive device: Rolling walker (2 wheeled) Gait Pattern/deviations: Step-through pattern;Trunk flexed Gait velocity: decreased General Gait Details: used a RW for increased stability but pt admits she will prob use her quad cane in the house     PT Goals (current goals can  now be found in the care plan section) Acute Rehab PT Goals Patient Stated Goal: to go back home  Visit Information  Last PT Received On: 06/10/13 History of Present Illness: 80 yo femaled admitted with bright red blood in her stool.  She says she has multiple myeloma and is on chemotherapy.  She had a fall in novemeber followed by skilled nursing facility rehab and HHPT,just discharged about a week ago.   Pt also reports she has DM and high blood pressure but she has that under control    Subjective Data  Patient Stated Goal: to go back home   Cognition  Cognition Arousal/Alertness: Awake/alert Behavior During Therapy: WFL for tasks assessed/performed Overall Cognitive Status: Within Functional Limits for tasks assessed    Balance     End of Session PT - End of Session Equipment Utilized During Treatment: Gait belt Activity Tolerance: Patient tolerated treatment well Patient left: in chair;with family/visitor present      PTA [facility]  Acute  Rehab Pager      [phone] 

## 2013-06-10 NOTE — Evaluation (Signed)
Occupational Therapy Evaluation Patient Details Name: Christina Hopkins MRN: [id number] DOB: 10-09-1925 Today's Date: 06/10/2013 Time: 8250-5397 OT Time Calculation (min): 20 min  OT Assessment / Plan / Recommendation History of present illness 80 yo femaled admitted with bright red blood in her stool.  She says she has multiple myeloma and is on chemotherapy.  She had a fall in novemeber followed by skilled nursing facility rehab and HHPT,just discharged about a week ago.   Pt also reports she has DM and high blood pressure but she has that under control   Clinical Impression   Pt continues to have blood in stool. Nursing made aware. She states she felt a little light headed after returning from the bathroom for toileting. Assisted pt back to bed. Feels he will benefit from skilled OT services to increase her independence for d/c home with intermittant assist.     OT Assessment  Patient needs continued OT Services    Follow Up Recommendations  Home health OT;Supervision/Assistance - 24 hour    Barriers to Discharge      Equipment Recommendations  None recommended by OT    Recommendations for Other Services    Frequency  Min 2X/week    Precautions / Restrictions Precautions Precautions: Fall   Pertinent Vitals/Pain States some back pain; notified nursing.    ADL  Eating/Feeding: Independent Where Assessed - Eating/Feeding: Bed level Grooming: Wash/dry hands;Min guard Where Assessed - Grooming: Unsupported standing Upper Body Bathing: Chest;Right arm;Left arm;Abdomen;Set up Where Assessed - Upper Body Bathing: Unsupported sitting Lower Body Bathing: Min guard Where Assessed - Lower Body Bathing: Supported sit to stand Upper Body Dressing: Minimal assistance (to manage IV) Where Assessed - Upper Body Dressing: Unsupported sitting Lower Body Dressing: Min guard Where Assessed - Lower Body Dressing: Supported sit to stand Toilet Transfer: Min guard;Minimal assistance (used quad  cane) Science writer: Comfort height toilet Toileting - Water quality scientist and Hygiene: Min guard Where Assessed - Best boy and Hygiene: Sit to stand from 3-in-1 or toilet Equipment Used: Cane ADL Comments: Pt had BM with OT noting blood in stool. Notified nursing who was present in room. Pt states she feels a little light headed and requested to return to bed. Supposed to get blood today.    OT Diagnosis: Generalized weakness  OT Problem List: Decreased strength;Decreased knowledge of use of DME or AE OT Treatment Interventions: Self-care/ADL training;DME and/or AE instruction;Therapeutic activities;Patient/family education   OT Goals(Current goals can be found in the care plan section) Acute Rehab OT Goals Patient Stated Goal: to go back home OT Goal Formulation: With patient Time For Goal Achievement: 06/24/13 Potential to Achieve Goals: Good  Visit Information  Last OT Received On: 06/10/13 History of Present Illness: 80 yo femaled admitted with bright red blood in her stool.  She says she has multiple myeloma and is on chemotherapy.  She had a fall in novemeber followed by skilled nursing facility rehab and HHPT,just discharged about a week ago.   Pt also reports she has DM and high blood pressure but she has that under control       Prior [place] expects to be discharged to:: Private residence Living Arrangements: Children Available Help at Discharge: Family (states daughter available in evenings and granddaugther can check in during the day) Type of Home: House Home Access: Stairs to enter [employer] of Steps: 2 Entrance Stairs-Rails: Left Home Layout: One [place]: [place] - 2 wheels;Cane -  quad;Wheelchair - manual;Bedside commode;Shower seat;Grab bars - tub/shower Additional Comments: Pt states lives with daughter who works during the day Prior Function Level of Independence:  Independent with assistive device(s);Needs assistance (quad cane) ADL's / Homemaking Assistance Needed: has supervision for showering, does own sponge bath/dress/toileting, daughter helps with meals/houshold tasks. Comments: cane or RW Communication Communication: No difficulties Dominant Hand: Left         Vision/Perception     Cognition  Cognition Arousal/Alertness: Awake/alert Behavior During Therapy: WFL for tasks assessed/performed Overall Cognitive Status: Within Functional Limits for tasks assessed    Extremity/Trunk Assessment Upper Extremity Assessment Upper Extremity Assessment: RUE deficits/detail RUE Deficits / Details: limited to only about 15 degrees shoulder flexion, other joints generally WFL     Mobility Bed Mobility Bed Mobility: Sit to Supine Sit to supine: Min guard Transfers Overall transfer level: Needs assistance Equipment used: Quad cane Transfers: Sit to/from Stand Sit to Stand: Min guard     Exercise     Balance     End of Session OT - End of Session Equipment Utilized During Treatment: Other (comment) (quad cane) Activity Tolerance: Other (comment) (states she felt a little light headed after activity) Patient left: in bed;with call bell/phone within reach  GO     Jules Schick [phone] 06/10/2013, 10:32 AM

## 2013-06-10 NOTE — Care Management Note (Signed)
Cm spoke with patient concerning discharge planning. Pt resides with adult daughter.PT recommends HHPT. Per pt recently discharged form [facility], would prefer to resume [facility] services with that agency. Cm spoke with pt's daughter who is also agreeable to discharge disposition home with [facility]. No there barriers or needs identified. Awaiting MD orders& face/face for PT.     Venita Lick Raedyn Wenke,MSN,RN [phone]

## 2013-06-10 NOTE — Telephone Encounter (Signed)
I called Christina Hopkins and left he a message per Dr. Juliann Mule. He did not want to give her aranesp today since she is receiving the blood. We will cancel her appointment for 06/12/13. She is scheduled for labs/MD for 06/26/13.

## 2013-06-11 ENCOUNTER — Telehealth: Payer: Self-pay | Admitting: Internal Medicine

## 2013-06-11 LAB — TYPE AND SCREEN
ABO/RH(D): O POS
Antibody Screen: NEGATIVE
UNIT DIVISION: 0
UNIT DIVISION: 0
Unit division: 0
Unit division: 0

## 2013-06-11 LAB — GLUCOSE, CAPILLARY
GLUCOSE-CAPILLARY: 145 mg/dL — AB (ref 70–99)
Glucose-Capillary: 120 mg/dL — ABNORMAL HIGH (ref 70–99)
Glucose-Capillary: 118 mg/dL — ABNORMAL HIGH (ref 70–99)
Glucose-Capillary: 121 mg/dL — ABNORMAL HIGH (ref 70–99)

## 2013-06-11 NOTE — Progress Notes (Signed)
Occupational Therapy Treatment Patient Details Name: Christina Hopkins MRN: [id number] DOB: [birth year] Today's Date: 06/11/2013 Time: 2119-4174 OT Time Calculation (min): 30 min  OT Assessment / Plan / Recommendation  History of present illness 80 yo femaled admitted with bright red blood in her stool.  She says she has multiple myeloma and is on chemotherapy.  She had a fall in novemeber followed by skilled nursing facility rehab and HHPT,just discharged about a week ago.   Pt also reports she has DM and high blood pressure but she has that under control   OT comments  Pt performing ADL today with improved independence. She also looks better today and is very interactive. She will benefit from continued OT while on acute.   Follow Up Recommendations  Home health OT;Supervision/Assistance - 24 hour    Barriers to Discharge       Equipment Recommendations  None recommended by OT    Recommendations for Other Services    Frequency Min 2X/week   Progress towards OT Goals Progress towards OT goals: Progressing toward goals  Plan Discharge plan remains appropriate    Precautions / Restrictions Precautions Precautions: Fall Restrictions Weight Bearing Restrictions: No   Pertinent Vitals/Pain No complaint of pain    ADL  Upper Body Bathing: Chest;Right arm;Left arm;Abdomen;Set up Where Assessed - Upper Body Bathing: Unsupported sitting Lower Body Bathing: Min guard Where Assessed - Lower Body Bathing: Supported sit to stand Upper Body Dressing: Minimal assistance (assist with gown around IV) Where Assessed - Upper Body Dressing: Unsupported sitting Lower Body Dressing: Set up;Min guard (socks and slippers only. stood to pull up underwear after applying new pad) Where Assessed - Lower Body Dressing: Supported sit to Lobbyist: Min guard (with quad cane) Toilet Transfer Equipment: Raised toilet seat with arms (or 3-in-1 over toilet) Toileting - Clothing Manipulation and  Hygiene: Min guard Where Assessed - Best boy and Hygiene: Sit to stand from 3-in-1 or toilet Equipment Used: Cane ADL Comments: Pt appears to be feeling better and states she does feel some better than yesterday. She states she will use her  quad cane in the house so practiced using it to transfer into bathroom to 3in1 and back out. Min guard assist today. Min guard standing ADL such as pulling up underwear or washing.     OT Diagnosis:    OT Problem List:   OT Treatment Interventions:     OT Goals(current goals can now be found in the care plan section)    Visit Information  Last OT Received On: 06/11/13 Assistance Needed: +1 History of Present Illness: 80 yo femaled admitted with bright red blood in her stool.  She says she has multiple myeloma and is on chemotherapy.  She had a fall in novemeber followed by skilled nursing facility rehab and HHPT,just discharged about a week ago.   Pt also reports she has DM and high blood pressure but she has that under control    Subjective Data      Prior Functioning       Cognition  Cognition Arousal/Alertness: Awake/alert Behavior During Therapy: WFL for tasks assessed/performed Overall Cognitive Status: Within Functional Limits for tasks assessed    Mobility  Bed Mobility Bed Mobility: Supine to Sit Supine to sit: Supervision Transfers Overall transfer level: Needs assistance Equipment used: Quad cane Transfers: Sit to/from Stand Sit to Stand: Supervision    Exercises      Balance General Comments General comments (skin integrity, edema, etc.): stood to  wash periareas and pull up underwear with min guard assist.  End of Session OT - End of Session Equipment Utilized During Treatment: Other (comment) (quad cane) Activity Tolerance: Patient tolerated treatment well Patient left: in chair;with call bell/phone within reach  GO     Jules Schick [phone] 06/11/2013, 9:36 AM

## 2013-06-11 NOTE — Progress Notes (Signed)
Christina Hopkins 10:45 AM  Subjective: Patient feeling better overall today and says she has not seen blood in her stools and her case was discussed with her daughter on the phone and she has no new complaint  Objective: Vital signs stable afebrile no acute distress abdomen is soft nontender obvious hernia hemoglobin increased status post transfusion  Assessment: Probable diverticular  Plan: Will keep her on clear liquids one more day watching for signs of bleeding and the daughter is okay with [place] holding off on colonoscopy for now but realized if she continues to bleed we will need to proceed  [facility] E

## 2013-06-11 NOTE — Progress Notes (Signed)
[facility] PROGRESS NOTE  Christina Hopkins QQV:[id number] DOB: October 24, 1925 DOA: 06/06/2013 PCP: [name], MD Brief narrative: Patient is an 80 year old who presented with complaints of bright red blood per rectum. Patient has history of ventral hernia which surgery was consult at and no surgery found. GI on board and currently helping with recommendations. Patient's hemoglobin has trended down despite various transfusion. Plans will be for observation at this point but should patient continue to bleed more testing will be required please see GI notes.  Assessment/Plan: Active Problems:   Acute GI bleeding  - Per GI-rectal bleeding probably diverticular with large complex abdominal wall hernia.  - Hemoglobin improved after transfusion of 2 units of packed red blood cells yesterday 06/10/2013 - Plan is to observe for one more day. Patient denies any bloody stools today 06/11/2013 - Reassess hemoglobin levels next a.m.  DM  - Insulin sliding scale  - Carb modified diet. - Blood sugars relatively well controlled  Hypertension  - Well controlled off of home blood pressure medication. Will continue to monitor   Multiple myeloma  -Patient to continue routine followup with oncologist once discharged.  Chronic systolic/diastolic CHF - compensated currently - Holding Ace I and B blocker due to soft BP's  Code Status: DNR Family Communication: None at bedside. Pt updated on plan of care. Disposition Plan: Per GI/Gen surg and Hbg/Hct in AM.   Consultants:  GI  Gen surgery  Procedures:  none  Antibiotics:  None  HPI/Subjective: Pt has no new complaints.  No acute issues reported overnight. Patient denies any new bloody stools  Objective: Filed Vitals:   06/11/13 1321  BP: 149/80  Pulse: 93  Temp: 98.4 F (36.9 C)  Resp: 18    Intake/Output Summary (Last 24 hours) at 06/11/13 1413 Last data filed at 06/11/13 1321  Gross per 24 hour  Intake 3282.5 ml   Output   2350 ml  Net  932.5 ml   Filed Weights   06/06/13 1828  Weight: 44.634 kg (98 lb 6.4 oz)    Exam:   General:  Pt in NAD, Alert and awake  Cardiovascular: RRR, no MRG  Respiratory: CTA BL, no wheezes  Abdomen: Ventral hernia, NT, soft  Musculoskeletal: no cyanosis, edema, or clubbing   Data Reviewed: Basic Metabolic Panel:  Recent Labs Lab 06/06/13 0957 06/07/13 0200  NA 136* 132*  K 4.4 4.6  CL 95* 95*  CO2 30 28  GLUCOSE 174* 131*  BUN 14 15  CREATININE 0.69 0.76  CALCIUM 9.6 8.3*   Liver Function Tests:  Recent Labs Lab 06/06/13 0957  AST 13  ALT 9  ALKPHOS 61  BILITOT 0.4  PROT 6.8  ALBUMIN 3.4*    Recent Labs Lab 06/06/13 0957  LIPASE 13   No results found for this basename: AMMONIA,  in the last 168 hours CBC:  Recent Labs Lab 06/06/13 0957  06/07/13 0735 06/07/13 2102 06/09/13 0924 06/10/13 0450 06/10/13 1921  WBC 8.5  < > 5.5 6.3 4.6 3.9* 5.9  NEUTROABS 7.1  --   --   --   --   --   --   HGB 10.3*  < > 6.8* 9.4* 8.6* 7.7* 11.9*  HCT 29.6*  < > 19.5* 27.0* 25.3* 22.7* 34.5*  MCV 100.3*  < > 100.0 93.4 94.4 95.8 92.0  PLT 177  < > 136* 129* 157 163 176  < > = values in this interval not displayed. Cardiac Enzymes: No results found for this  basename: CKTOTAL, CKMB, CKMBINDEX, TROPONINI,  in the last 168 hours BNP (last 3 results)  Recent Labs  03/23/13 1036  PROBNP 2180.0*   CBG:  Recent Labs Lab 06/10/13 1202 06/10/13 1723 06/10/13 2137 06/11/13 0743 06/11/13 1206  GLUCAP 90 167* 123* 145* 120*    No results found for this or any previous visit (from the past 240 hour(s)).   Studies: No results found.  Scheduled Meds: . insulin aspart  0-9 Units Subcutaneous TID WC  . pantoprazole  40 mg Oral Daily  . polyethylene glycol  17 g Oral Once   Continuous Infusions: . dextrose 5 % and 0.45 % NaCl with KCl 20 mEq/L 75 mL/hr at 06/11/13 4098    Active Problems:   Multiple myeloma   Diabetes  mellitus   Hypertension   GI bleed   Acute GI bleeding   Chronic combined systolic and diastolic congestive heart failure    Time spent: > 35 minutes    Velvet Bathe  [facility] Pager [phone] If 7PM-7AM, please contact night-coverage at [email or web address], password [facility] 06/11/2013, 2:13 PM  LOS: 5 days

## 2013-06-12 ENCOUNTER — Other Ambulatory Visit: Payer: Medicare Other

## 2013-06-12 ENCOUNTER — Ambulatory Visit: Payer: Medicare Other

## 2013-06-12 DIAGNOSIS — K449 Diaphragmatic hernia without obstruction or gangrene: Secondary | ICD-10-CM | POA: Diagnosis present

## 2013-06-12 DIAGNOSIS — Z85038 Personal history of other malignant neoplasm of large intestine: Secondary | ICD-10-CM | POA: Diagnosis present

## 2013-06-12 DIAGNOSIS — E785 Hyperlipidemia, unspecified: Secondary | ICD-10-CM | POA: Diagnosis present

## 2013-06-12 DIAGNOSIS — R1013 Epigastric pain: Secondary | ICD-10-CM | POA: Diagnosis present

## 2013-06-12 LAB — BASIC METABOLIC PANEL
BUN: 8 mg/dL (ref 6–23)
CO2: 27 mEq/L (ref 19–32)
Calcium: 8.7 mg/dL (ref 8.4–10.5)
Chloride: 100 mEq/L (ref 96–112)
Creatinine, Ser: 0.63 mg/dL (ref 0.50–1.10)
GFR calc Af Amer: >90 mL/min (ref >90)
GFR, EST NON AFRICAN AMERICAN: 80 mL/min — AB (ref >90)
GLUCOSE: 155 mg/dL — AB (ref 70–99)
POTASSIUM: 4.1 meq/L (ref 3.7–5.3)
SODIUM: 137 meq/L (ref 137–147)

## 2013-06-12 LAB — CBC
HEMATOCRIT: 31.8 % — AB (ref 36.0–46.0)
HEMOGLOBIN: 10.6 g/dL — AB (ref 12.0–15.0)
MCH: 31.1 pg (ref 26.0–34.0)
MCHC: 33.3 g/dL (ref 30.0–36.0)
MCV: 93.3 fL (ref 78.0–100.0)
Platelets: 182 10*3/uL (ref 150–400)
RBC: 3.41 MIL/uL — ABNORMAL LOW (ref 3.87–5.11)
RDW: 16.2 % — AB (ref 11.5–15.5)
WBC: 3.6 10*3/uL — ABNORMAL LOW (ref 4.0–10.5)

## 2013-06-12 LAB — GLUCOSE, CAPILLARY
GLUCOSE-CAPILLARY: 148 mg/dL — AB (ref 70–99)
GLUCOSE-CAPILLARY: 150 mg/dL — AB (ref 70–99)
Glucose-Capillary: 106 mg/dL — ABNORMAL HIGH (ref 70–99)
Glucose-Capillary: 193 mg/dL — ABNORMAL HIGH (ref 70–99)

## 2013-06-12 MED ORDER — CARVEDILOL 12.5 MG PO TABS
12.5000 mg | ORAL_TABLET | Freq: Two times a day (BID) | ORAL | Status: DC
  Administered 2013-06-13 – 2013-06-14 (×3): 12.5 mg via ORAL
  Filled 2013-06-12 (×5): qty 1

## 2013-06-12 MED ORDER — MAGNESIUM CITRATE PO SOLN
1.0000 | Freq: Once | ORAL | Status: AC
Start: 2013-06-12 — End: 2013-06-12
  Administered 2013-06-12: 1 via ORAL
  Filled 2013-06-12: qty 296

## 2013-06-12 MED ORDER — CARVEDILOL 25 MG PO TABS
25.0000 mg | ORAL_TABLET | Freq: Two times a day (BID) | ORAL | Status: DC
  Administered 2013-06-12: 25 mg via ORAL
  Filled 2013-06-12 (×2): qty 1

## 2013-06-12 MED ORDER — POLYETHYLENE GLYCOL 3350 17 GM/SCOOP PO POWD
1.0000 | Freq: Once | ORAL | Status: AC
  Administered 2013-06-12: 1 via ORAL
  Filled 2013-06-12: qty 255

## 2013-06-12 MED ORDER — SODIUM CHLORIDE 0.9 % IV BOLUS (SEPSIS)
500.0000 mL | Freq: Once | INTRAVENOUS | Status: AC
  Administered 2013-06-12: 500 mL via INTRAVENOUS

## 2013-06-12 MED ORDER — SODIUM CHLORIDE 0.9 % IV SOLN
INTRAVENOUS | Status: DC
Start: 2013-06-12 — End: 2013-06-14
  Administered 2013-06-12: 16:00:00 via INTRAVENOUS

## 2013-06-12 NOTE — Progress Notes (Signed)
Pt had maroon colored bowel movement . Dr. Rockne Menghini and Dr. Watt Climes notified.

## 2013-06-12 NOTE — Progress Notes (Signed)
[facility] PROGRESS NOTE   ANNALEISE Hopkins YIA:[id number] DOB: [birth year] DOA: 06/06/2013 PCP: [name], MD  Brief narrative: Christina Hopkins is an 80 y.o. female with PMH of colon cancer, status post colectomy, ventral hernia, hiatal hernia, coronary artery disease status post MI, diabetes and hypertension was admitted on 06/06/13 with bright red rectal bleeding.  Assessment/Plan: Principal Problem:   Acute GI bleeding Thought to be a diverticular bleed. Evaluated by general surgery on 06/06/13 and by gastroenterology 06/07/13 with recommendations for supportive care and transfusion support to maintain hemoglobin between 8-9. No immediate plans for colonoscopy unless the patient continues to have evidence of ongoing GI bleeding. Status post a total of 2 units of packed red blood cells. Active Problems:   Multiple myeloma Under the care of Dr. Juliann Mule, last dose 05/29/13.   Diabetes mellitus Metformin on hold. Receiving insulin sensitive SSI 3 times a day. CBGs 118-150.   Hypertension Coreg, Aldactone and lisinopril currently on hold. Resume Coreg.   Coronary artery disease with history of MI Aspirin on hold.   Moderate protein-calorie malnutrition   Hyperlipidemia Not currently on statin therapy   Chronic combined systolic and diastolic congestive heart failure Compensated. Spironolactone and lisinopril currently on hold.   Abdominal pain, epigastric ventral hernia No evidence of incarceration.   Hiatal hernia  Continue PPI.   History of colon cancer Status post colon resection in 2003.  Code Status: DNR Family Communication: No family currently at the bedside. Disposition Plan: Home soon.   IV access:  Peripheral IV  Medical Consultants:  Dr. Fanny Skates, Surgery  Dr. Teena Irani, Gastroenterology  Other Consultants:  Occupational therapy: 24-hour supervision. Home health OT.  Physical therapy: Home health  PT.  Anti-infectives:  None.  HPI/Subjective: Christina Hopkins has not had any rectal bleeding over the past 24 hours. No complaints of dyspnea, dizziness, nausea or vomiting. Still has having some abdominal discomfort.  Objective: Filed Vitals:   06/11/13 0648 06/11/13 1321 06/11/13 2139 06/12/13 0553  BP: 133/63 149/80 134/77 144/68  Pulse: 92 93 98 93  Temp: 97.9 F (36.6 C) 98.4 F (36.9 C) 98 F (36.7 C) 97.9 F (36.6 C)  TempSrc: Oral Oral Oral Oral  Resp: _0 Height:      Weight:      SpO2: 96% 92% 98% 99%    Intake/Output Summary (Last 24 hours) at 06/12/13 1211 Last data filed at 06/12/13 1112  Gross per 24 hour  Intake 3459.5 ml  Output   3250 ml  Net  209.5 ml    Exam: Gen:  NAD Cardiovascular:  RRR, No M/R/G Respiratory:  Lungs CTAB Gastrointestinal:  Abdomen softly distended, mildly tender midepigastrium, + BS Extremities:  No C/E/C  Data Reviewed: Basic Metabolic Panel:  Recent Labs Lab 06/06/13 0957 06/07/13 0200  NA 136* 132*  K 4.4 4.6  CL 95* 95*  CO2 30 28  GLUCOSE 174* 131*  BUN 14 15  CREATININE 0.69 0.76  CALCIUM 9.6 8.3*   GFR Estimated Creatinine Clearance: 31.3 ml/min (by C-G formula based on Cr of 0.76). Liver Function Tests:  Recent Labs Lab 06/06/13 0957  AST 13  ALT 9  ALKPHOS 61  BILITOT 0.4  PROT 6.8  ALBUMIN 3.4*    Recent Labs Lab 06/06/13 0957  LIPASE 13   Coagulation profile  Recent Labs Lab 06/06/13 0957  INR 1.09    CBC:  Recent Labs Lab 06/06/13 0957  06/07/13 2102 06/09/13 7078 06/10/13 0450  06/10/13 1921 06/12/13 0343  WBC 8.5  < > 6.3 4.6 3.9* 5.9 3.6*  NEUTROABS 7.1  --   --   --   --   --   --   HGB 10.3*  < > 9.4* 8.6* 7.7* 11.9* 10.6*  HCT 29.6*  < > 27.0* 25.3* 22.7* 34.5* 31.8*  MCV 100.3*  < > 93.4 94.4 95.8 92.0 93.3  PLT 177  < > 129* 157 163 176 182  < > = values in this interval not displayed.  BNP (last 3 results)  Recent Labs  03/23/13 1036  PROBNP  2180.0*   CBG:  Recent Labs Lab 06/11/13 1206 06/11/13 1732 06/11/13 2136 06/12/13 0745 06/12/13 1131  GLUCAP 120* 118* 121* 148* 150*    Procedures and Diagnostic Studies:  Ct Abdomen Pelvis W Contrast 06/06/2013  IMPRESSION: 1. Hiatal hernia and colonic diverticulosis. No focal bowel lesion is identified by CT. 2. Complex lower pelvic ventral hernia containing portions of the bladder and colon without signs of incarceration.   Electronically Signed   By: Aletta Edouard M.D.   On: 06/06/2013 12:09    Dg Abd 2 Views 06/07/2013 IMPRESSION: Progression of oral contrast material to the distal descending/proximal sigmoid colon in the region of the hernia. No bowel dilatation seen to suggest obstruction.   Electronically Signed   By: Logan Bores   On: 06/07/2013 08:14    Scheduled Meds: . insulin aspart  0-9 Units Subcutaneous TID WC  . pantoprazole  40 mg Oral Daily  . polyethylene glycol  17 g Oral Once   Continuous Infusions: . dextrose 5 % and 0.45 % NaCl with KCl 20 mEq/L 75 mL/hr at 06/12/13 0530    Time spent: 25 minutes.   LOS: 6 days   [place] [facility] Pager [phone].   *Please note that the hospitalists switch teams on Wednesdays. Please call the flow manager at [phone] if you are having difficulty reaching the hospitalist taking care of this patient as she can update you and provide the most up-to-date pager number of provider caring for the patient. If 8PM-8AM, please contact night-coverage at [email or web address], password [facility]  06/12/2013, 12:11 PM    Information printed out, explained, and given to the patient:  In an effort to keep you and your family informed about your hospital stay, I am providing you with this information sheet. If you or your family have any questions, please do not hesitate to have the nursing staff page me to set up a meeting time.  Christina Hopkins 06/12/2013 6 (Number of days in the hospital)  Treatment team:  Dr.  Jacquelynn Cree, Hospitalist (Internist)  Dr. Fanny Skates, Surgery  Dr. Teena Irani, Gastroenterology  Active Treatment Issues with Plan: Principal Problem:   Rectal bleeding Thought to be a diverticular bleed. Evaluated by general surgery on 06/06/13 and by gastroenterology 06/07/13 with recommendations for supportive care and transfusion support to maintain hemoglobin between 8-9. No immediate plans for colonoscopy unless the patient continues to have evidence of ongoing GI bleeding. You have received a total of 2 units of packed red blood cells while in hospital. Active Problems:   Multiple myeloma Under the care of Dr. Juliann Mule, last dose 05/29/13.   Diabetes Metformin on hold. Receiving insulin sensitive SSI 3 times a day. CBGs 118-150.   Hypertension Coreg, Aldactone and lisinopril currently on hold. Resume Coreg.   Heart disease Aspirin on hold.   History of heart failure Stable. Spironolactone and lisinopril currently  on hold.   Abdominal pain May be related to your hernia, no evidence of bowel being trapped.   Hiatal hernia  Continue medicine to inhibit stomach acid production.   History of colon cancer Status post colon resection in 2003.  Anticipated discharge date: Today or tomorrow.

## 2013-06-12 NOTE — Progress Notes (Signed)
Christina Hopkins 12:48 PM  Subjective: Patient without complaints and no bowel movement yet today  Objective: Vital signs stable afebrile no acute distress exam unchanged hemoglobin slight decrease  Assessment: Bright red blood per rectum currently stable  Plan: I discussed the case with the hospital team and will be on standby to help when necessary otherwise we will try to advance her diet and I'm happy see back as an outpatient when necessary  [facility] E

## 2013-06-12 NOTE — Progress Notes (Signed)
PT Cancellation Note  Patient Details Name: Christina Hopkins MRN: [id number] DOB: 03/03/1926   Cancelled Treatment:    Reason Eval/Treat Not Completed: Medical issues which prohibited therapy (pt reports she thoughtnshe was going to a test, doesn't feel she can walk in hall. Will attempt return this PM, if not, check back tomorrow.)   Claretha Cooper 06/12/2013, 12:12 PM Tresa Endo PT [phone]

## 2013-06-13 ENCOUNTER — Encounter (HOSPITAL_COMMUNITY)
Admission: EM | Disposition: A | Discharge status: Home Health | Payer: Medicare Other | Source: Home / Self Care | Attending: Family Medicine

## 2013-06-13 ENCOUNTER — Encounter (HOSPITAL_COMMUNITY): Payer: Medicare Other | Admitting: Anesthesiology

## 2013-06-13 ENCOUNTER — Encounter (HOSPITAL_COMMUNITY): Payer: Self-pay

## 2013-06-13 ENCOUNTER — Inpatient Hospital Stay (HOSPITAL_COMMUNITY): Payer: Medicare Other | Admitting: Anesthesiology

## 2013-06-13 HISTORY — PX: COLONOSCOPY: SHX5424

## 2013-06-13 LAB — CBC
HEMATOCRIT: 33.2 % — AB (ref 36.0–46.0)
HEMOGLOBIN: 10.8 g/dL — AB (ref 12.0–15.0)
MCH: 31 pg (ref 26.0–34.0)
MCHC: 32.5 g/dL (ref 30.0–36.0)
MCV: 95.4 fL (ref 78.0–100.0)
Platelets: 204 10*3/uL (ref 150–400)
RBC: 3.48 MIL/uL — ABNORMAL LOW (ref 3.87–5.11)
RDW: 16 % — ABNORMAL HIGH (ref 11.5–15.5)
WBC: 4.2 10*3/uL (ref 4.0–10.5)

## 2013-06-13 LAB — GLUCOSE, CAPILLARY
GLUCOSE-CAPILLARY: 119 mg/dL — AB (ref 70–99)
Glucose-Capillary: 121 mg/dL — ABNORMAL HIGH (ref 70–99)
Glucose-Capillary: 127 mg/dL — ABNORMAL HIGH (ref 70–99)
Glucose-Capillary: 125 mg/dL — ABNORMAL HIGH (ref 70–99)

## 2013-06-13 SURGERY — COLONOSCOPY
Anesthesia: Monitor Anesthesia Care

## 2013-06-13 MED ORDER — LACTATED RINGERS IV SOLN
INTRAVENOUS | Status: DC | PRN
  Administered 2013-06-13: 11:00:00 via INTRAVENOUS

## 2013-06-13 MED ORDER — FENTANYL CITRATE 0.05 MG/ML IJ SOLN
INTRAMUSCULAR | Status: DC | PRN
  Administered 2013-06-13 (×2): 50 ug via INTRAVENOUS

## 2013-06-13 MED ORDER — PROMETHAZINE HCL 25 MG/ML IJ SOLN: 6.2500 mg | INTRAMUSCULAR | Status: DC | PRN

## 2013-06-13 MED ORDER — SODIUM CHLORIDE 0.9 % IV SOLN: INTRAVENOUS | Status: DC

## 2013-06-13 MED ORDER — HYDROCORTISONE ACETATE 25 MG RE SUPP
25.0000 mg | Freq: Two times a day (BID) | RECTAL | Status: DC
  Administered 2013-06-13 (×2): 25 mg via RECTAL
  Filled 2013-06-13 (×4): qty 1

## 2013-06-13 MED ORDER — PHENYLEPHRINE HCL 10 MG/ML IJ SOLN
INTRAMUSCULAR | Status: DC | PRN
  Administered 2013-06-13 (×4): 20 ug via INTRAVENOUS

## 2013-06-13 NOTE — Progress Notes (Signed)
PT Cancellation Note  ___Treatment cancelled today due to medical issues with patient which prohibited therapy  ___ Treatment cancelled today due to patient receiving procedure or test   ___ Treatment cancelled today due to patient's refusal to participate   _X_ Treatment cancelled today due to Coloscopy in am then resting in pm.  Rica Koyanagi  PTA [facility]  Acute  Rehab Pager      [phone]

## 2013-06-13 NOTE — Transfer of Care (Signed)
Immediate Anesthesia Transfer of Care Note  Patient: Christina Hopkins  Procedure(s) Performed: Procedure(s): COLONOSCOPY (N/A)  Patient Location: PACU and Endoscopy Unit  Anesthesia Type:MAC  Level of Consciousness: awake, alert , oriented and patient cooperative  Airway & Oxygen Therapy: Patient Spontanous Breathing and Patient connected to face mask oxygen  Post-op Assessment: Report given to PACU RN, Post -op Vital signs reviewed and stable and Patient moving all extremities  Post vital signs: Reviewed and stable  Complications: No apparent anesthesia complications

## 2013-06-13 NOTE — Anesthesia Postprocedure Evaluation (Signed)
  Anesthesia Post-op Note  Patient: Christina Hopkins  Procedure(s) Performed: Procedure(s) (LRB): COLONOSCOPY (N/A)  Patient Location: PACU  Anesthesia Type: MAC  Level of Consciousness: awake and alert   Airway and Oxygen Therapy: Patient Spontanous Breathing  Post-op Pain: mild  Post-op Assessment: Post-op Vital signs reviewed, Patient's Cardiovascular Status Stable, Respiratory Function Stable, Patent Airway and No signs of Nausea or vomiting  Last Vitals:  Filed Vitals:   06/13/13 1140  BP: 102/50  Pulse:   Temp: 36.7 C  Resp: 15    Post-op Vital Signs: stable   Complications: No apparent anesthesia complications

## 2013-06-13 NOTE — Progress Notes (Addendum)
[facility] PROGRESS NOTE   Christina Hopkins OPF:[id number] DOB: [birth year] DOA: 06/06/2013 PCP: [name], MD  Brief narrative: Christina Hopkins is an 80 y.o. female with PMH of colon cancer, status post colectomy, ventral hernia, hiatal hernia, coronary artery disease status post MI, diabetes and hypertension was admitted on 06/06/13 with bright red rectal bleeding.  Assessment/Plan: Principal Problem:   Acute GI bleeding Thought to be a diverticular bleed. Evaluated by general surgery on 06/06/13 and by gastroenterology 06/07/13 with recommendations for supportive care and transfusion support to maintain hemoglobin between 8-9. Ultimately taken for colonoscopy on 06/13/13 secondary to ongoing rectal bleeding, which showed diverticuli and hemorrhoids, but no active bleeding. Status post a total of 2 units of packed red blood cells. Advance diet and monitor another 24 hours. Hemoglobin remained stable. Active Problems:   Multiple myeloma Under the care of Dr. Juliann Mule, last dose 05/29/13.   Diabetes mellitus Metformin on hold. Receiving insulin sensitive SSI 3 times a day. CBGs [id number].   Hypertension Aldactone and lisinopril currently on hold. Continue Coreg.   Coronary artery disease with history of MI Aspirin on hold.   Moderate protein-calorie malnutrition Diet advanced.   Hyperlipidemia Not currently on statin therapy   Chronic combined systolic and diastolic congestive heart failure Compensated. Spironolactone and lisinopril currently on hold.   Abdominal pain, epigastric ventral hernia No evidence of incarceration.   Hiatal hernia  Continue PPI.   History of colon cancer Status post colon resection in 2003.  Code Status: DNR Family Communication: No family currently at the bedside. Disposition Plan: Home soon.   IV access:  Peripheral IV  Medical Consultants:  Dr. Fanny Skates, Surgery  Dr. Teena Irani, Gastroenterology  Other Consultants:  Occupational  therapy: 24-hour supervision. Home health OT.  Physical therapy: Home health PT.  Anti-infectives:  None.  HPI/Subjective: Christina Hopkins had a bloody bowel movement yesterday afternoon. No further bloody stools with a bowel prep. No nausea or vomiting. No complaints of pain.  Objective: Filed Vitals:   06/13/13 0942 06/13/13 1140 06/13/13 1150 06/13/13 1229  BP: 145/73 102/50 102/50 144/62  Pulse: 92   78  Temp: 97.7 F (36.5 C) 98 F (36.7 C)  97.7 F (36.5 C)  TempSrc:    Oral  Resp: _0 Height:      Weight:      SpO2: 93% 100% 100% 100%    Intake/Output Summary (Last 24 hours) at 06/13/13 1254 Last data filed at 06/13/13 1203  Gross per 24 hour  Intake   1186 ml  Output    200 ml  Net    986 ml    Exam: Gen:  NAD Cardiovascular:  RRR, No M/R/G Respiratory:  Lungs CTAB Gastrointestinal:  Abdomen softly distended, mildly tender midepigastrium, + BS Extremities:  No C/E/C  Data Reviewed: Basic Metabolic Panel:  Recent Labs Lab 06/07/13 0200 06/12/13 2235  NA 132* 137  K 4.6 4.1  CL 95* 100  CO2 28 27  GLUCOSE 131* 155*  BUN 15 8  CREATININE 0.76 0.63  CALCIUM 8.3* 8.7   GFR Estimated Creatinine Clearance: 31.3 ml/min (by C-G formula based on Cr of 0.63).  CBC:  Recent Labs Lab 06/09/13 0924 06/10/13 0450 06/10/13 1921 06/12/13 0343 06/13/13 0405  WBC 4.6 3.9* 5.9 3.6* 4.2  HGB 8.6* 7.7* 11.9* 10.6* 10.8*  HCT 25.3* 22.7* 34.5* 31.8* 33.2*  MCV 94.4 95.8 92.0 93.3 95.4  PLT 157 163 176 182 204  BNP (last 3 results)  Recent Labs  03/23/13 1036  PROBNP 2180.0*   CBG:  Recent Labs Lab 06/12/13 1131 06/12/13 1725 06/12/13 2110 06/13/13 0753 06/13/13 1218  GLUCAP 150* 106* 193* 119* 121*    Procedures and Diagnostic Studies:  Ct Abdomen Pelvis W Contrast 06/06/2013  IMPRESSION: 1. Hiatal hernia and colonic diverticulosis. No focal bowel lesion is identified by CT. 2. Complex lower pelvic ventral hernia containing  portions of the bladder and colon without signs of incarceration.   Electronically Signed   By: Aletta Edouard M.D.   On: 06/06/2013 12:09    Dg Abd 2 Views 06/07/2013 IMPRESSION: Progression of oral contrast material to the distal descending/proximal sigmoid colon in the region of the hernia. No bowel dilatation seen to suggest obstruction.   Electronically Signed   By: Logan Bores   On: 06/07/2013 08:14    Colonoscopy 06/13/2013:  FINDINGS:1. internal/external small hemorrhoids 2small and large  diverticula throughout the colon 3 otherwise within normal limits to the anastomosis and terminal ileum which was a terminal ileum end colon Side anastomosis without any blood coming from above    Scheduled Meds: . carvedilol  12.5 mg Oral BID WC  . hydrocortisone  25 mg Rectal BID  . insulin aspart  0-9 Units Subcutaneous TID WC  . pantoprazole  40 mg Oral Daily  . polyethylene glycol  17 g Oral Once   Continuous Infusions: . sodium chloride 10 mL/hr at 06/12/13 1624    Time spent: 25 minutes.   LOS: 7 days   [place] [facility] Pager [phone].   *Please note that the hospitalists switch teams on Wednesdays. Please call the flow manager at [phone] if you are having difficulty reaching the hospitalist taking care of this patient as she can update you and provide the most up-to-date pager number of provider caring for the patient. If 8PM-8AM, please contact night-coverage at [email or web address], password [facility]  06/13/2013, 12:54 PM

## 2013-06-13 NOTE — Preoperative (Addendum)
Beta Blockers   Reason not to administer Beta Blockers:Not Applicable 

## 2013-06-13 NOTE — Transfer of Care (Signed)
Immediate Anesthesia Transfer of Care Note  Patient: Christina Hopkins  Procedure(s) Performed: Procedure(s): COLONOSCOPY (N/A)  Patient Location: PACU and Endoscopy Unit  Anesthesia Type:MAC  Level of Consciousness: awake, alert , oriented and patient cooperative  Airway & Oxygen Therapy: Patient Spontanous Breathing and Patient connected to face mask oxygen  Post-op Assessment: Report given to PACU RN, Post -op Vital signs reviewed and stable and Patient moving all extremities  Post vital signs: Reviewed and stable  Complications: No apparent anesthesia complications 

## 2013-06-13 NOTE — Anesthesia Preprocedure Evaluation (Addendum)
Anesthesia Evaluation  Patient identified by MRN, date of birth, ID band Patient awake    Reviewed: Allergy & Precautions, H&P , NPO status , Patient's Chart, lab work & pertinent test results  Airway Mallampati: II TM Distance: >3 FB Neck ROM: Full    Dental no notable dental hx.    Pulmonary neg pulmonary ROS,  breath sounds clear to auscultation  Pulmonary exam normal       Cardiovascular hypertension, Pt. on medications + CAD, + Past MI and +CHF Rhythm:Regular Rate:Normal     Neuro/Psych negative neurological ROS  negative psych ROS   GI/Hepatic negative GI ROS, Neg liver ROS,   Endo/Other  diabetes  Renal/GU negative Renal ROS  negative genitourinary   Musculoskeletal negative musculoskeletal ROS (+)   Abdominal   Peds negative pediatric ROS (+)  Hematology  (+) anemia ,   Anesthesia Other Findings   Reproductive/Obstetrics negative OB ROS                          Anesthesia Physical Anesthesia Plan  ASA: III  Anesthesia Plan: MAC   Post-op Pain Management:    Induction: Intravenous  Airway Management Planned: Nasal Cannula  Additional Equipment:   Intra-op Plan:   Post-operative Plan:   Informed Consent: I have reviewed the patients History and Physical, chart, labs and discussed the procedure including the risks, benefits and alternatives for the proposed anesthesia with the patient or authorized representative who has indicated his/her understanding and acceptance.   Dental advisory given  Plan Discussed with: CRNA and Surgeon  Anesthesia Plan Comments:         Anesthesia Quick Evaluation

## 2013-06-13 NOTE — Op Note (Signed)
[facility] [place], [address]   COLONOSCOPY PROCEDURE REPORT  PATIENT: Christina Hopkins, Christina Hopkins  MR#: [id number] BIRTHDATE: 12-Dec-1925 , 87  yrs. old GENDER: Female ENDOSCOPIST: Clarene Essex, MD REFERRED BY: PROCEDURE DATE:  06/13/2013 PROCEDURE:   Colonoscopy, diagnostic ASA CLASS:   Class III INDICATIONS:Rectal Bleeding.  history of diverticuli and colon cancer MEDICATIONS: propofol (Diprivan) 55mg  IV and Fentanyl 100 mcg IV  DESCRIPTION OF PROCEDURE:   After the risks benefits and alternatives of the procedure were thoroughly explained, informed consent was obtained.  The Pentax Ped Colon [id number]  endoscope was introduced through the anus and advanced to the terminal ileum which was intubated for a short distance , limited by No adverse events experienced.   The quality of the prep was adequate. .  The instrument was then slowly withdrawn as the colon was fully examined.the findings are recorded below and there is no signs of active bleeding or any at risk lesions and the patient tolerated the procedure well there was no obvious immediate complication the nurse did gently press on her ventral hernia for most of the procedure       FINDINGS:1. internal/external small hemorrhoids 2small and large diverticula throughout the colon 3 otherwise within normal limits to the anastomosis and terminal ileum which was a terminal ileum end  colon  Side anastomosis without any blood coming from above  COMPLICATIONS: none  IMPRESSION:  above  RECOMMENDATIONS: advance diet hopefully home soon happy to see back when necessary   _______________________________ eSigned:  Clarene Essex, MD 06/13/2013 11:42 AM   CC:

## 2013-06-13 NOTE — Progress Notes (Signed)
OT Cancellation Note  Patient Details Name: Christina Hopkins MRN: [id number] DOB: [birth year]   Cancelled Treatment:    Reason Eval/Treat Not Completed: Patient at procedure or test/ unavailable  Will check back.  [name] 06/13/2013, 10:26 AM [name], OTR/L [phone] 06/13/2013

## 2013-06-13 NOTE — Progress Notes (Signed)
Christina Hopkins 11:00 AM  Subjective: Patient doing well and had no problems with the prep and no obvious bleeding and no new complaint  Objective: Vital signs stable afebrile exam please see pre-assessment evaluation hemoglobin finally stable  Assessment: Probable diverticular bleeding  Plan: Okay to proceed with attempts at colonoscopy with anesthesia assistance  [facility]

## 2013-06-14 ENCOUNTER — Encounter (HOSPITAL_COMMUNITY): Payer: Self-pay | Admitting: Gastroenterology

## 2013-06-14 ENCOUNTER — Other Ambulatory Visit: Payer: Medicare Other

## 2013-06-14 LAB — CBC
HCT: 31 % — ABNORMAL LOW (ref 36.0–46.0)
Hemoglobin: 10.4 g/dL — ABNORMAL LOW (ref 12.0–15.0)
MCH: 31.7 pg (ref 26.0–34.0)
MCHC: 33.5 g/dL (ref 30.0–36.0)
MCV: 94.5 fL (ref 78.0–100.0)
PLATELETS: 183 10*3/uL (ref 150–400)
RBC: 3.28 MIL/uL — ABNORMAL LOW (ref 3.87–5.11)
RDW: 15.6 % — AB (ref 11.5–15.5)
WBC: 3.2 10*3/uL — ABNORMAL LOW (ref 4.0–10.5)

## 2013-06-14 LAB — GLUCOSE, CAPILLARY: Glucose-Capillary: 128 mg/dL — ABNORMAL HIGH (ref 70–99)

## 2013-06-14 MED ORDER — HYDROCORTISONE ACETATE 25 MG RE SUPP: 25.0000 mg | Freq: Two times a day (BID) | RECTAL | Status: DC

## 2013-06-14 NOTE — Discharge Summary (Signed)
Physician Discharge Summary  Christina Hopkins ZWC:[id number] DOB: 17-Aug-1925 DOA: 06/06/2013  PCP: [name], MD  Admit date: 06/06/2013 Discharge date: 06/14/2013  Recommendations for Outpatient Follow-up:  1. Home health PT/OT set up. 2. F/U CBC in 1 week to ensure stability of hemoglobin.  Discharge Diagnoses:  Principal Problem:    Acute GI bleeding, likely diverticular Active Problems:    Multiple myeloma    Diabetes mellitus    Hypertension    Coronary artery disease with history of MI    Moderate protein-calorie malnutrition    Hyperlipidemia    Chronic combined systolic and diastolic congestive heart failure    Abdominal pain, epigastric ventral hernia    Hiatal hernia    History of colon cancer   Discharge Condition: Stable.  Diet recommendation: Carbohydrate modified.  History of present illness:  Christina Hopkins is an 80 y.o. female with PMH of colon cancer, status post colectomy, ventral hernia, hiatal hernia, coronary artery disease status post MI, diabetes and hypertension was admitted on 06/06/13 with bright red rectal bleeding.  Hospital Course by problem:  Principal Problem:  Acute GI bleeding  Thought to be a diverticular bleed. Evaluated by general surgery on 06/06/13 and by gastroenterology 06/07/13 with recommendations for supportive care and transfusion support to maintain hemoglobin between 8-9. Ultimately taken for colonoscopy on 06/13/13 secondary to ongoing rectal bleeding, which showed diverticuli and hemorrhoids, but no active bleeding. Status post a total of 2 units of packed red blood cells.  Hemoglobin stable at discharge.  Active Problems:  Multiple myeloma  Under the care of Dr. Juliann Mule, last dose of chemotherapy given 05/29/13.  Diabetes mellitus  Managed with sliding scale insulin while in the hospital, resume metformin at discharge.  Hypertension  Resume Aldactone, lisinopril and Coreg.  Coronary artery disease with history of MI   Aspirin on hold. Advised to hold this for one week. Moderate protein-calorie malnutrition  Diet advanced and the patient was encouraged to eat.  Hyperlipidemia  Not currently on statin therapy  Chronic combined systolic and diastolic congestive heart failure  Compensated. Continue spironolactone and lisinopril.  Abdominal pain, epigastric ventral hernia  No evidence of incarceration.  Hiatal hernia  Continue PPI.  History of colon cancer  Status post colon resection in 2003.  Medical Consultants:  Dr. Fanny Skates, Surgery  Dr. Teena Irani, Gastroenterology   Discharge Exam: Filed Vitals:   06/14/13 0549  BP: 112/53  Pulse: 75  Temp: 98 F (36.7 C)  Resp: 15   Filed Vitals:   06/13/13 1150 06/13/13 1229 06/13/13 2111 06/14/13 0549  BP: 102/50 144/62 96/52 112/53  Pulse:  78 73 75  Temp:  97.7 F (36.5 C) 97.9 F (36.6 C) 98 F (36.7 C)  TempSrc:  Oral Oral Oral  Resp: 17 18 16 15   Height:      Weight:      SpO2: 100% 100% 97% 95%    Gen:  NAD Cardiovascular:  RRR, No M/R/G Respiratory: Lungs CTAB Gastrointestinal: Abdomen soft, NT/ND with normal active bowel sounds. Extremities: No C/E/C   Discharge Instructions  Discharge Orders   Future Appointments Provider Department Dept Phone   06/26/2013 9:00 AM [facility] [phone]   06/26/2013 9:30 AM [facility] Covering Provider [facility] [phone]   06/26/2013 10:30 AM [facility] [phone]   Future Orders Complete By Expires   Call MD for:  As directed  Scheduling Instructions:     Recurrent bleeding from the rectum.   Diet Carb Modified  As directed    Discharge instructions  As directed    Comments:     You were cared for by Dr. Jacquelynn Cree  (a hospitalist) during your hospital stay. If you have any questions about your discharge medications or the care you  received while you were in the hospital after you are discharged, you can call the unit and ask to speak with the hospitalist on call if the hospitalist that took care of you is not available. Once you are discharged, your primary care physician will handle any further medical issues. Please note that NO REFILLS for any discharge medications will be authorized once you are discharged, as it is imperative that you return to your primary care physician (or establish a relationship with a primary care physician if you do not have one) for your aftercare needs so that they can reassess your need for medications and monitor your lab values.  Any outstanding tests can be reviewed by your PCP at your follow up visit.  It is also important to review any medicine changes with your PCP.  Please bring these d/c instructions with you to your next visit so your physician can review these changes with you.  If you do not have a primary care physician, you can call [phone] for a physician referral.  It is highly recommended that you obtain a PCP for hospital follow up.   Face-to-face encounter (required for Medicare/Medicaid patients)  As directed    Comments:     I Nidhi Jacome certify that this patient is under my care and that I, or a nurse practitioner or physician's assistant working with me, had a face-to-face encounter that meets the physician face-to-face encounter requirements with this patient on 06/14/2013. The encounter with the patient was in whole, or in part for the following medical condition(s) which is the primary reason for home health care (List medical condition): Deconditioning post d/c, frailty.   Questions:     The encounter with the patient was in whole, or in part, for the following medical condition, which is the primary reason for home health care:  GIB, deconditioning post hospitalization.   I certify that, based on my findings, the following services are medically necessary home health  services:  Physical therapy   My clinical findings support the need for the above services:  Unable to leave home safely without assistance and/or assistive device   Further, I certify that my clinical findings support that this patient is homebound due to:  Unable to leave home safely without assistance   Reason for Medically Necessary Home Health Services:  Therapy- Home Adaptation to Facilitate Safety   Therapy- Therapeutic Exercises to Increase Strength and Endurance   Therapy- Instruction on Safe use of Assistive Devices for ADLs   Home Health  As directed    Questions:     To provide the following care/treatments:  PT   OT   Increase activity slowly  As directed    Scheduling Instructions:     Walk with a cane.       Medication List    STOP taking these medications       aspirin EC 81 MG tablet      TAKE these medications       acetaminophen 650 MG CR tablet  Commonly known as:  TYLENOL  Take 650 mg by mouth every 8 (eight) hours as needed  for pain.     acyclovir 400 MG tablet  Commonly known as:  ZOVIRAX  Take 1 tablet (400 mg total) by mouth 2 (two) times daily.     calcium carbonate 1250 MG chewable tablet  Commonly known as:  OS-CAL  Chew 1 tablet by mouth daily.     carvedilol 25 MG tablet  Commonly known as:  COREG  Take 25 mg by mouth 2 (two) times daily with a meal.     cyanocobalamin 1000 MCG/ML injection  Commonly known as:  (VITAMIN B-12)  Inject 1,000 mcg into the muscle every 30 (thirty) days.     cyclobenzaprine 10 MG tablet  Commonly known as:  FLEXERIL  Take 1 tablet (10 mg total) by mouth 3 (three) times daily as needed. For muscle spasm     HYDROcodone-acetaminophen 10-325 MG per tablet  Commonly known as:  NORCO  every 4 (four) hours as needed. Take one tablet by mouth every 6 hours as needed for pain     hydrocortisone 25 MG suppository  Commonly known as:  ANUSOL-HC  Place 1 suppository (25 mg total) rectally 2 (two) times daily.      lisinopril 2.5 MG tablet  Commonly known as:  PRINIVIL,ZESTRIL  Take 2.5 mg by mouth every other day.     loperamide 2 MG capsule  Commonly known as:  IMODIUM  Take by mouth as needed for diarrhea or loose stools.     magnesium oxide 400 MG tablet  Commonly known as:  MAG-OX  Take 400 mg by mouth daily.     metFORMIN 500 MG tablet  Commonly known as:  GLUCOPHAGE  Take 500 mg by mouth 2 (two) times daily with a meal.     multivitamin with minerals Tabs tablet  Take 1 tablet by mouth daily.     pantoprazole 40 MG tablet  Commonly known as:  PROTONIX  Take 40 mg by mouth daily.     PREVIDENT DT  Place onto teeth at bedtime.     prochlorperazine 10 MG tablet  Commonly known as:  COMPAZINE  Take 1 tablet (10 mg total) by mouth every 6 (six) hours as needed. For nausea     spironolactone 25 MG tablet  Commonly known as:  ALDACTONE  Take 25 mg by mouth every other day.     SYSTANE OP  Place 2 drops into both eyes at bedtime.     Vitamin D-3 5000 UNITS Tabs  Take by mouth at bedtime.           Follow-up Information   Schedule an appointment as soon as possible for a visit with [name], MD. (1 week for hospital follow up, to check labs.)    Specialty:  Family Medicine   Contact information:   [address] [place] [address] [phone]        The results of significant diagnostics from this hospitalization (including imaging, microbiology, ancillary and laboratory) are listed below for reference.    Procedures and Diagnostic Studies:  Ct Abdomen Pelvis W Contrast 06/06/2013 IMPRESSION: 1. Hiatal hernia and colonic diverticulosis. No focal bowel lesion is identified by CT. 2. Complex lower pelvic ventral hernia containing portions of the bladder and colon without signs of incarceration. Electronically Signed By: Aletta Edouard M.D. On: 06/06/2013 12:09  Dg Abd 2 Views 06/07/2013 IMPRESSION: Progression of oral contrast material to the distal  descending/proximal sigmoid colon in the region of the hernia. No bowel dilatation seen to suggest  obstruction. Electronically Signed By: Logan Bores On: 06/07/2013 08:14  Colonoscopy 06/13/2013: FINDINGS:1. internal/external small hemorrhoids 2small and large diverticula throughout the colon 3 otherwise within normal limits to the anastomosis and terminal ileum which was a terminal ileum end colon Side anastomosis without any blood coming from above    Labs:  Basic Metabolic Panel:  Recent Labs Lab 06/12/13 2235  NA 137  K 4.1  CL 100  CO2 27  GLUCOSE 155*  BUN 8  CREATININE 0.63  CALCIUM 8.7   GFR Estimated Creatinine Clearance: 31.3 ml/min (by C-G formula based on Cr of 0.63).  CBC:  Recent Labs Lab 06/10/13 0450 06/10/13 1921 06/12/13 0343 06/13/13 0405 06/14/13 0320  WBC 3.9* 5.9 3.6* 4.2 3.2*  HGB 7.7* 11.9* 10.6* 10.8* 10.4*  HCT 22.7* 34.5* 31.8* 33.2* 31.0*  MCV 95.8 92.0 93.3 95.4 94.5  PLT 163 176 182 204 183   CBG:  Recent Labs Lab 06/13/13 0753 06/13/13 1218 06/13/13 1725 06/13/13 2110 06/14/13 0755  GLUCAP 119* 121* 127* 125* 128*    Time coordinating discharge: 35 minutes.  Signed:  Tiesha Marich  Pager [phone] [facility] 06/14/2013, 9:43 AM

## 2013-06-14 NOTE — Progress Notes (Signed)
OT Cancellation Note  Patient Details Name: Christina Hopkins MRN: [id number] DOB: [birth year]   Cancelled Treatment:    Reason Eval/Treat Not Completed: Other (comment)  Pt is discharging today.  Pt/daughter had no education needs/concerns prior to d/c.  Gibril Mastro 06/14/2013, 12:28 PM [name], OTR/L [phone] 06/14/2013

## 2013-06-14 NOTE — Discharge Instructions (Signed)

## 2013-06-26 ENCOUNTER — Ambulatory Visit: Payer: Medicare Other

## 2013-06-26 ENCOUNTER — Ambulatory Visit (HOSPITAL_BASED_OUTPATIENT_CLINIC_OR_DEPARTMENT_OTHER): Payer: Medicare Other | Admitting: Internal Medicine

## 2013-06-26 ENCOUNTER — Other Ambulatory Visit: Payer: Medicare Other

## 2013-06-26 ENCOUNTER — Ambulatory Visit (HOSPITAL_BASED_OUTPATIENT_CLINIC_OR_DEPARTMENT_OTHER): Payer: Medicare Other

## 2013-06-26 ENCOUNTER — Telehealth: Payer: Self-pay | Admitting: Internal Medicine

## 2013-06-26 VITALS — BP 106/44 | HR 66 | Temp 96.9°F | Resp 17 | Ht <= 58 in | Wt 99.1 lb

## 2013-06-26 DIAGNOSIS — C9000 Multiple myeloma not having achieved remission: Secondary | ICD-10-CM

## 2013-06-26 DIAGNOSIS — D638 Anemia in other chronic diseases classified elsewhere: Secondary | ICD-10-CM

## 2013-06-26 DIAGNOSIS — E538 Deficiency of other specified B group vitamins: Secondary | ICD-10-CM

## 2013-06-26 LAB — CBC WITH DIFFERENTIAL/PLATELET
BASO%: 0.8 % (ref 0.0–2.0)
Basophils Absolute: 0 10*3/uL (ref 0.0–0.1)
EOS%: 2 % (ref 0.0–7.0)
Eosinophils Absolute: 0.1 10*3/uL (ref 0.0–0.5)
HCT: 34.3 % — ABNORMAL LOW (ref 34.8–46.6)
HGB: 11.2 g/dL — ABNORMAL LOW (ref 11.6–15.9)
LYMPH%: 19.9 % (ref 14.0–49.7)
MCH: 31.2 pg (ref 25.1–34.0)
MCHC: 32.7 g/dL (ref 31.5–36.0)
MCV: 95.5 fL (ref 79.5–101.0)
MONO#: 0.5 10*3/uL (ref 0.1–0.9)
MONO%: 11.6 % (ref 0.0–14.0)
NEUT#: 2.6 10*3/uL (ref 1.5–6.5)
NEUT%: 65.7 % (ref 38.4–76.8)
PLATELETS: 133 10*3/uL — AB (ref 145–400)
RBC: 3.59 10*6/uL — AB (ref 3.70–5.45)
RDW: 15.5 % — ABNORMAL HIGH (ref 11.2–14.5)
WBC: 4 10*3/uL (ref 3.9–10.3)
lymph#: 0.8 10*3/uL — ABNORMAL LOW (ref 0.9–3.3)

## 2013-06-26 LAB — COMPREHENSIVE METABOLIC PANEL (CC13)
ALK PHOS: 54 U/L (ref 40–150)
ALT: 8 U/L (ref 0–55)
ANION GAP: 8 meq/L (ref 3–11)
AST: 16 U/L (ref 5–34)
Albumin: 3.4 g/dL — ABNORMAL LOW (ref 3.5–5.0)
BUN: 9.9 mg/dL (ref 7.0–26.0)
CHLORIDE: 101 meq/L (ref 98–109)
CO2: 29 meq/L (ref 22–29)
Calcium: 9.4 mg/dL (ref 8.4–10.4)
Creatinine: 0.7 mg/dL (ref 0.6–1.1)
GLUCOSE: 109 mg/dL (ref 70–140)
Potassium: 4.3 mEq/L (ref 3.5–5.1)
SODIUM: 138 meq/L (ref 136–145)
TOTAL PROTEIN: 6.2 g/dL — AB (ref 6.4–8.3)
Total Bilirubin: 0.37 mg/dL (ref 0.20–1.20)

## 2013-06-26 MED ORDER — CYANOCOBALAMIN 1000 MCG/ML IJ SOLN
1000.0000 ug | Freq: Once | INTRAMUSCULAR | Status: AC
  Administered 2013-06-26: 1000 ug via INTRAMUSCULAR

## 2013-06-26 NOTE — Progress Notes (Signed)
[place], MD [address] [place] [address]  DIAGNOSIS: Multiple myeloma - Plan: CBC with Differential, Basic metabolic panel (Bmet) - [facility], CBC with Differential  Chief Complaint  Patient presents with  . Multiple Myeloma   DIAGNOSIS:  1. Multiple myeloma.  2. Anemia secondary to vitamin B12 deficiency.   PAST THERAPY:  Revlimid (08/2009-08/2012)  Zometa (02/2010 - 02/2012)  Velcade (05/2011 -present)  Cytoxan 08/2012 - present)  Decadron (08/2012 - present)   CURRENT THERAPY:  1. Velcade, Cytoxan and Decadron every 2 weeks from 08/29/2012.  2. Vitamin B12 mg IM monthly.  3. Aranesp 300 mcg subcu every 2 weeks for hemoglobin less than or equal to 10.   Interim History: The patient is a pleasant 80 y.o. female who presented for follow up visit. She was last seen by me on 05/29/2012. Today, she is accompanied by her daughter.    She was recently discharged on 06/14/2013 following a ten-day hospitalization due to acute GIB thought to be secondary to diverticulosis and hemorrhoids.  She received received a total of 4 units of blood.  She has been deconditioned since this admission and has required physical therapy.   Prior to that, she was recently discharged on 03/28/2013 following a 5-day admission secondary to mechanical fall she had at home resulting in left sided rib fractures (7th, 8th and 9th ribs) and findings concerning for PNA.  She was discharged to the [facility] and completed physical rehabilitation on 04/19/2013.    Today, she reports ongoing physical therapy without any further falls.  She feels as if her strength is not yet at baseline. She denies  focal neurological symptoms, headaches or visual changes, abdominal or urinary. Her appetite and weight is stable. Patient had double and blurry vision and had successful cataract surgery this past September.  She still  has mild  diarrhea for which he takes Imodium. She reported pain in back,neck and shoulders; numbness in fingers and feet, and dry skin. The patient denied fever, chills, night sweats, change in appetite or weight. She denied odynophagia or dysphagia. No chest pain, palpitations, abdominal pain, vomiting, constipation, hematochezia. The patient denied dysuria, nocturia, polyuria, hematuria, myalgia, tingling, psychiatric problems.   MEDICAL HISTORY: Past Medical History  Diagnosis Date  . Hypertension   . Diabetes mellitus   . Anemia   . Osteoporosis   . Dyslipidemia   . Myocardial infarction   . colon ca dx'd 2003    surg only  . Multiple myeloma dx'd 2009    oral chemo ongoing  . Coronary artery disease   . Shortness of breath   . GERD (gastroesophageal reflux disease)   . Wears glasses     INTERIM HISTORY: has Multiple myeloma; Anemia of chronic disease; Magnesium deficiency; Diabetes mellitus; Hypertension; Coronary artery disease with history of MI; Fall; Fracture of multiple ribs; PNA (pneumonia); Thrombocytopenia, unspecified; Hyponatremia; Moderate protein-calorie malnutrition; Hypomagnesemia; Hyperlipidemia; Hypokalemia; GI bleed; Acute GI bleeding; Chronic combined systolic and diastolic congestive heart failure; Abdominal pain, epigastric ventral hernia; Hiatal hernia; and History of colon cancer on her problem list.    ALLERGIES:  is allergic to gemfibrozil; nifedipine; and questran.  MEDICATIONS: has a current medication list which includes the following prescription(s): acetaminophen, acyclovir, calcium carbonate, carvedilol, vitamin d-3, cyanocobalamin, cyclobenzaprine, hydrocodone-acetaminophen, hydrocortisone, lisinopril, loperamide, magnesium oxide, metformin, multivitamin with minerals, pantoprazole, polyethyl glycol-propyl glycol, prochlorperazine, sodium fluoride, and spironolactone, and the following Facility-Administered Medications: darbepoetin.  SURGICAL HISTORY:  Past  Surgical History  Procedure Laterality Date  . Coronary artery bypass graft  08/2001    4 vessel  . Colon surgery  2003  . Tonsillectomy    . Appendectomy    . Abdominal hysterectomy    . Back surgery    . Eye surgery      cataracts  . Direct laryngoscopy with radiaesse injection Left 08/13/2012    Procedure: LEFT VOCAL CORD RADIESSE AUGMENTATION LARYNGOSCOPY ;  Surgeon: Jodi Marble, MD;  Location: [facility];  Service: ENT;  Laterality: Left;  . Colonoscopy N/A 06/13/2013    Procedure: COLONOSCOPY;  Surgeon: Jeryl Columbia, MD;  Location: [facility];  Service: Endoscopy;  Laterality: N/A;   Problem List:  1. Multiple myeloma, initially presenting as an IgA kappa monoclonal gammopathy in February 2009. 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 This was felt to be an adverse prognostic determinant. Bone marrow on 08/31/2007 showed 46% plasma cells. A repeat bone marrow on 08/05/2009 showed 73% plasma cells. Treatment with Revlimid was started in April 2011. Zometa was started in October 2011 and 2 years of treatment were concluded on 02/23/2012. We had previously started Aranesp for the patient's anemia in May of 2010. Most recent metastatic bone survey was on 08/05/2009. Other x-rays since then are available. Maximum IgA level was 2080 on 08/11/2009. Subcutaneous Velcade was added to the patient's treatment program on 06/14/2011 because of a rising IgA level 1080 on 06/02/2011. The patient had been receiving Velcade every 2 weeks in combination with Revlimid during the early months of 2014. The patient continues to demonstrate progressive disease as evidenced by rising IgA and serum kappa levels. Revlimid was discontinued in April 2014. The patient had received Revlimid for about 3 years from April 2011 through April 2014. As of 08/29/2012, the patient is now receiving Velcade, Cytoxan and Decadron every 2 weeks. The patient has also been receiving Aranesp 300 mcg subcu every 2  weeks for hemoglobin less than or equal to 10.  2. Anemia secondary to vitamin B12 deficiency. The patient had been on oral vitamin B12. However, her vitamin B12 level fell to 284 on 07/26/2011 and vitamin B12 shots 1000 mcg given IM every month was resumed on 09/06/2011.  3. Anemia secondary to iron deficiency.   REVIEW OF SYSTEMS:   Constitutional: Denies fevers, chills or abnormal weight loss Eyes: Denies blurriness of vision Ears, nose, mouth, throat, and face: Denies mucositis or sore throat Respiratory: Denies cough, dyspnea or wheezes Cardiovascular: Denies palpitation, chest discomfort or lower extremity swelling Gastrointestinal:  Denies nausea, heartburn;  positive for constipation Skin: Denies abnormal skin rashes Lymphatics: Denies new lymphadenopathy or easy bruising Neurological:Denies numbness, tingling or new weaknesses Behavioral/Psych: Mood is stable, no new changes  All other systems were reviewed with the patient and are negative.  PHYSICAL EXAMINATION: ECOG PERFORMANCE STATUS: 1 - Symptomatic but completely ambulatory  Blood pressure 106/44, pulse 66, temperature 96.9 F (36.1 C), temperature source Oral, resp. rate 17, height 4' 9"  (1.448 m), weight 99 lb 1.6 oz (44.951 kg).  GENERAL:alert, no distress and comfortable; Diminished hearing; Kyphotic SKIN: skin color, texture, turgor are normal, no rashes or significant lesions  EYES: normal, Conjunctiva are pink and non-injected, sclera clear OROPHARYNX:no exudate, no erythema and lips, buccal mucosa, and tongue normal  NECK: supple, thyroid normal size, non-tender, without nodularity LYMPH:  no palpable lymphadenopathy in the cervical, axillary or supraclavicular LUNGS: clear to auscultation and percussion with normal breathing effort  HEART: regular rate & rhythm and no murmurs and no lower extremity edema ABDOMEN:abdomen soft, non-tender and normal bowel sounds Musculoskeletal:no cyanosis of digits and no clubbing   NEURO: alert & oriented x 3 with fluent speech, no focal motor/sensory deficits; cautious gait with a walker.   Lab Results  Component Value Date   WBC 4.0 06/26/2013   HGB 11.2* 06/26/2013   HCT 34.3* 06/26/2013   MCV 95.5 06/26/2013   PLT 133* 06/26/2013   NEUTROABS 2.6 06/26/2013      Chemistry      Component Value Date/Time   NA 138 06/26/2013 1035   NA 137 06/12/2013 2235   K 4.3 06/26/2013 1035   K 4.1 06/12/2013 2235   CL 100 06/12/2013 2235   CL 101 10/18/2012 1051   CO2 29 06/26/2013 1035   CO2 27 06/12/2013 2235   BUN 9.9 06/26/2013 1035   BUN 8 06/12/2013 2235   CREATININE 0.7 06/26/2013 1035   CREATININE 0.63 06/12/2013 2235   CREATININE 0.69 08/11/2009 1145      Component Value Date/Time   CALCIUM 9.4 06/26/2013 1035   CALCIUM 8.7 06/12/2013 2235   ALKPHOS 54 06/26/2013 1035   ALKPHOS 61 06/06/2013 0957   AST 16 06/26/2013 1035   AST 13 06/06/2013 0957   ALT 8 06/26/2013 1035   ALT 9 06/06/2013 0957   BILITOT 0.37 06/26/2013 1035   BILITOT 0.4 06/06/2013 0957      Results for DAMISHA, WOLFF (MRN [id number]) as of 06/27/2013 05:33  Ref. Range 01/08/2013 08:18 02/20/2013 08:38 03/20/2013 08:05 05/01/2013 10:30 05/29/2013 10:43  IgA Latest Range: 69-380 mg/dL 505 (H) 428 (H) 449 (H) 632 (H) 536 (H)   RADIOGRAPHIC STUDIES: METASTATIC BONE SURVEY 11/01/2012 Comparison: 08/05/2009 Correlation: Chest and abdominal radiographs 09/05/2012, CT chest 07/25/2012 Findings: Bones appear demineralized. Extensive scattered atherosclerotic calcifications. Post CABG. Severe multilevel degenerative disc and facet disease changes of the cervicothoracic and lumbar spine. Prior vertebroplasties at T11, T12, L1. Endplate compression deformities at T6, T7, and T10 unchanged. Inferior endplate compression deformity L3 new since radiographs 2011. No definite new thoracic compression fracture. Bowel gas projects over the right inferior pubic ramus and obturator foramen question obturator hernia. Bilateral   acromioclavicular and glenohumeral joint degenerative changes with bilateral chronic rotator cuff tears. Degenerative changes bilateral knees. Hiatal hernia. Callus identified at a probable healing fracture of a lateral lower left rib, new. IMPRESSION: Osseous demineralization with multiple thoracolumbar compression fractures and prior vertebroplasties. Inferior endplate compression deformity L3 new since 0947 though of uncertain age. No definite destructive lytic lesion identified to suggest new multiple myelomatous site. Scattered degenerative changes as above. Small hiatal hernia. Extensive atherosclerotic disease. Question right obturator hernia.   ASSESSMENT: [name] 80 y.o. female with a history of Multiple myeloma - Plan: CBC with Differential, Basic metabolic panel (Bmet) - [facility], CBC with Differential   PLAN:  1. Multiple myeloma. --She has not recovered from her recent hospitalization for GIB and is undergoing physical therapy as noted in HPI.   Her counts appear to have recovered. We will HOLD her chemotherapy today and continue in 2 weeks given her recent prolonged hospitalization and deconditioning.  -- Her IgA level and serum light chains have improved on current regiment overall. Her last IgA was 536 down from 632 on 12/17.  --We will  proceed with her chemotherapy with Velcade subcutaneous, Cytoxan 400 mg, IV Decadron 20 mg IV (Day #1, cycle #43) in 2 weeks on 02/25.2015. She has been  provided a handout explaining common side-effects on prior visits.   2. S/p recent GIB. --We will allow time for her complete physical and watch closely for evidence of further bleeding.    3. Multiple rib fractures (L 7th, 8th, 9th) -- Continue Hydrocodone-acetaminophen 10-325 q 4 hours as need.    4. Follow up. RTC in 2 weeks for evaluation for another treatment with CBC, CMET, IgA level, and serum light chains. She will have her next treatment in 2 weeks with labs prior to this visit.   All  questions were answered. The patient knows to call the clinic with any problems, questions or concerns. We can certainly see the patient much sooner if necessary.  I spent 15 minutes counseling the patient face to face. The total time spent in the appointment was 25 minutes.    Ozzie Knobel, MD 06/27/2013 5:32 AM

## 2013-06-26 NOTE — Patient Instructions (Signed)
Diverticulosis Diverticulosis is a common condition that develops when small pouches (diverticula) form in the wall of the colon. The risk of diverticulosis increases with age. It happens more often in people who eat a low-fiber diet. Most individuals with diverticulosis have no symptoms. Those individuals with symptoms usually experience abdominal pain, constipation, or loose stools (diarrhea). HOME CARE INSTRUCTIONS   Increase the amount of fiber in your diet as directed by your caregiver or dietician. This may reduce symptoms of diverticulosis.  Your caregiver may recommend taking a dietary fiber supplement.  Drink at least 6 to 8 glasses of water each day to prevent constipation.  Try not to strain when you have a bowel movement.  Your caregiver may recommend avoiding nuts and seeds to prevent complications, although this is still an uncertain benefit.  Only take over-the-counter or prescription medicines for pain, discomfort, or fever as directed by your caregiver. FOODS WITH HIGH FIBER CONTENT INCLUDE:  Fruits. Apple, peach, pear, tangerine, raisins, prunes.  Vegetables. Brussels sprouts, asparagus, broccoli, cabbage, carrot, cauliflower, romaine lettuce, spinach, summer squash, tomato, winter squash, zucchini.  Starchy Vegetables. Baked beans, kidney beans, lima beans, split peas, lentils, potatoes (with skin).  Grains. Whole wheat bread, Waas rice, bran flake cereal, plain oatmeal, white rice, shredded wheat, bran muffins. SEEK IMMEDIATE MEDICAL CARE IF:   You develop increasing pain or severe bloating.  You have an oral temperature above 102 F (38.9 C), not controlled by medicine.  You develop vomiting or bowel movements that are bloody or black. Document Released: 01/28/2004 Document Revised: 07/25/2011 Document Reviewed: 09/30/2009 [facility] Patient Information 2014 [place]. Gastrointestinal Bleeding Gastrointestinal bleeding is bleeding somewhere along the path  that food travels through the body (digestive tract). This path is anywhere between the mouth and the opening of the butt (anus). You may have blood in your throw up (vomit) or in your poop (stools). If there is a lot of bleeding, you may need to stay in the hospital. [place]  Only take medicine as told by your doctor.  Eat foods with fiber such as whole grains, fruits, and vegetables. You can also try eating 1 to 3 prunes a day.  Drink enough fluids to keep your pee (urine) clear or pale yellow. GET HELP RIGHT AWAY IF:   Your bleeding gets worse.  You feel dizzy, weak, or you pass out (faint).  You have bad cramps in your back or belly (abdomen).  You have large blood clumps (clots) in your poop.  Your problems are getting worse. MAKE SURE YOU:   Understand these instructions.  Will watch your condition.  Will get help right away if you are not doing well or get worse. Document Released: 02/09/2008 Document Revised: 04/18/2012 Document Reviewed: 04/11/2011 [facility] Patient Information 2014 [place], [place].

## 2013-06-26 NOTE — Telephone Encounter (Signed)
gv relative appt schedule for feb °

## 2013-06-27 LAB — KAPPA/LAMBDA LIGHT CHAINS
KAPPA LAMBDA RATIO: 16.63 — AB (ref 0.26–1.65)
Kappa free light chain: 13.8 mg/dL — ABNORMAL HIGH (ref 0.33–1.94)
Lambda Free Lght Chn: 0.83 mg/dL (ref 0.57–2.63)

## 2013-06-27 LAB — IGA: IGA: 751 mg/dL — AB (ref 69–380)

## 2013-06-28 ENCOUNTER — Telehealth: Payer: Self-pay | Admitting: Internal Medicine

## 2013-06-28 ENCOUNTER — Other Ambulatory Visit: Payer: Self-pay | Admitting: Internal Medicine

## 2013-06-28 NOTE — Telephone Encounter (Signed)
s/w pt re next appt for 2/18 @ 12pm. pt to get new schedule 2/18. per pt if dtr cannot make it she will call back to r/s.

## 2013-07-03 ENCOUNTER — Ambulatory Visit (HOSPITAL_BASED_OUTPATIENT_CLINIC_OR_DEPARTMENT_OTHER): Payer: Medicare Other

## 2013-07-03 ENCOUNTER — Other Ambulatory Visit: Payer: Medicare Other

## 2013-07-03 ENCOUNTER — Other Ambulatory Visit (HOSPITAL_BASED_OUTPATIENT_CLINIC_OR_DEPARTMENT_OTHER): Payer: Medicare Other

## 2013-07-03 ENCOUNTER — Encounter: Payer: Self-pay | Admitting: Internal Medicine

## 2013-07-03 ENCOUNTER — Ambulatory Visit: Payer: Medicare Other

## 2013-07-03 VITALS — BP 119/51 | HR 71 | Temp 97.9°F

## 2013-07-03 DIAGNOSIS — Z5112 Encounter for antineoplastic immunotherapy: Secondary | ICD-10-CM

## 2013-07-03 DIAGNOSIS — Z5111 Encounter for antineoplastic chemotherapy: Secondary | ICD-10-CM

## 2013-07-03 DIAGNOSIS — C9000 Multiple myeloma not having achieved remission: Secondary | ICD-10-CM

## 2013-07-03 LAB — CBC WITH DIFFERENTIAL/PLATELET
BASO%: 0.7 % (ref 0.0–2.0)
Basophils Absolute: 0 10*3/uL (ref 0.0–0.1)
EOS%: 2.6 % (ref 0.0–7.0)
Eosinophils Absolute: 0.1 10*3/uL (ref 0.0–0.5)
HCT: 35 % (ref 34.8–46.6)
HGB: 11.5 g/dL — ABNORMAL LOW (ref 11.6–15.9)
LYMPH%: 26.8 % (ref 14.0–49.7)
MCH: 32 pg (ref 25.1–34.0)
MCHC: 32.9 g/dL (ref 31.5–36.0)
MCV: 97.2 fL (ref 79.5–101.0)
MONO#: 0.5 10*3/uL (ref 0.1–0.9)
MONO%: 12.7 % (ref 0.0–14.0)
NEUT#: 2.4 10*3/uL (ref 1.5–6.5)
NEUT%: 57.2 % (ref 38.4–76.8)
Platelets: 145 10*3/uL (ref 145–400)
RBC: 3.6 10*6/uL — AB (ref 3.70–5.45)
RDW: 17.3 % — ABNORMAL HIGH (ref 11.2–14.5)
WBC: 4.2 10*3/uL (ref 3.9–10.3)
lymph#: 1.1 10*3/uL (ref 0.9–3.3)

## 2013-07-03 LAB — BASIC METABOLIC PANEL (CC13)
Anion Gap: 9 mEq/L (ref 3–11)
BUN: 10.5 mg/dL (ref 7.0–26.0)
CALCIUM: 9.7 mg/dL (ref 8.4–10.4)
CO2: 26 meq/L (ref 22–29)
Chloride: 101 mEq/L (ref 98–109)
Creatinine: 0.7 mg/dL (ref 0.6–1.1)
GLUCOSE: 122 mg/dL (ref 70–140)
Potassium: 4.6 mEq/L (ref 3.5–5.1)
Sodium: 135 mEq/L — ABNORMAL LOW (ref 136–145)

## 2013-07-03 MED ORDER — SODIUM CHLORIDE 0.9 % IV SOLN
400.0000 mg | Freq: Once | INTRAVENOUS | Status: AC
  Administered 2013-07-03: 400 mg via INTRAVENOUS
  Filled 2013-07-03: qty 20

## 2013-07-03 MED ORDER — ONDANSETRON 8 MG/50ML IVPB (CHCC)
8.0000 mg | Freq: Once | INTRAVENOUS | Status: AC
  Administered 2013-07-03: 8 mg via INTRAVENOUS

## 2013-07-03 MED ORDER — SODIUM CHLORIDE 0.9 % IV SOLN
INTRAVENOUS | Status: DC
  Administered 2013-07-03: 14:00:00 via INTRAVENOUS

## 2013-07-03 MED ORDER — DEXAMETHASONE SODIUM PHOSPHATE 20 MG/5ML IJ SOLN
INTRAMUSCULAR | Status: AC
  Filled 2013-07-03: qty 5

## 2013-07-03 MED ORDER — BORTEZOMIB CHEMO SQ INJECTION 3.5 MG (2.5MG/ML)
1.7500 mg | Freq: Once | INTRAMUSCULAR | Status: AC
  Administered 2013-07-03: 1.75 mg via SUBCUTANEOUS
  Filled 2013-07-03: qty 1.75

## 2013-07-03 MED ORDER — ONDANSETRON 8 MG/NS 50 ML IVPB
INTRAVENOUS | Status: AC
  Filled 2013-07-03: qty 8

## 2013-07-03 MED ORDER — DEXAMETHASONE SODIUM PHOSPHATE 20 MG/5ML IJ SOLN
20.0000 mg | Freq: Once | INTRAMUSCULAR | Status: AC
Start: 2013-07-03 — End: 2013-07-03
  Administered 2013-07-03: 20 mg via INTRAVENOUS

## 2013-07-03 NOTE — Patient Instructions (Signed)
[facility] Discharge Instructions for Patients Receiving Chemotherapy  Today you received the following chemotherapy agents velcade/cytoxan.   To help prevent nausea and vomiting after your treatment, we encourage you to take your nausea medication as directed.    If you develop nausea and vomiting that is not controlled by your nausea medication, call the clinic.   BELOW ARE SYMPTOMS THAT SHOULD BE REPORTED IMMEDIATELY:  *FEVER GREATER THAN 100.5 F  *CHILLS WITH OR WITHOUT FEVER  NAUSEA AND VOMITING THAT IS NOT CONTROLLED WITH YOUR NAUSEA MEDICATION  *UNUSUAL SHORTNESS OF BREATH  *UNUSUAL BRUISING OR BLEEDING  TENDERNESS IN MOUTH AND THROAT WITH OR WITHOUT PRESENCE OF ULCERS  *URINARY PROBLEMS  *BOWEL PROBLEMS  UNUSUAL RASH Items with * indicate a potential emergency and should be followed up as soon as possible.  Feel free to call the clinic you have any questions or concerns. The clinic phone number is [phone].

## 2013-07-03 NOTE — Progress Notes (Signed)
Daughter came in inquiring about EPP. I advised her no longer have, but if over 5000.00 could inq about hardship. I advised also of 400.00 one time grant. She will discuss with patient and let me know.

## 2013-07-10 ENCOUNTER — Other Ambulatory Visit: Payer: Medicare Other

## 2013-07-10 ENCOUNTER — Ambulatory Visit: Payer: Medicare Other

## 2013-07-12 ENCOUNTER — Encounter: Payer: Self-pay | Admitting: Internal Medicine

## 2013-07-17 ENCOUNTER — Telehealth: Payer: Self-pay | Admitting: Internal Medicine

## 2013-07-17 ENCOUNTER — Ambulatory Visit (HOSPITAL_BASED_OUTPATIENT_CLINIC_OR_DEPARTMENT_OTHER): Payer: Medicare Other | Admitting: Internal Medicine

## 2013-07-17 ENCOUNTER — Ambulatory Visit (HOSPITAL_BASED_OUTPATIENT_CLINIC_OR_DEPARTMENT_OTHER): Payer: Medicare Other

## 2013-07-17 ENCOUNTER — Encounter (INDEPENDENT_AMBULATORY_CARE_PROVIDER_SITE_OTHER): Payer: Self-pay

## 2013-07-17 ENCOUNTER — Encounter: Payer: Self-pay | Admitting: Internal Medicine

## 2013-07-17 ENCOUNTER — Other Ambulatory Visit (HOSPITAL_BASED_OUTPATIENT_CLINIC_OR_DEPARTMENT_OTHER): Payer: Medicare Other

## 2013-07-17 VITALS — BP 116/48 | HR 75 | Temp 98.8°F | Resp 19 | Ht <= 58 in | Wt 98.8 lb

## 2013-07-17 DIAGNOSIS — C9000 Multiple myeloma not having achieved remission: Secondary | ICD-10-CM

## 2013-07-17 DIAGNOSIS — D638 Anemia in other chronic diseases classified elsewhere: Secondary | ICD-10-CM

## 2013-07-17 DIAGNOSIS — I10 Essential (primary) hypertension: Secondary | ICD-10-CM

## 2013-07-17 DIAGNOSIS — Z5112 Encounter for antineoplastic immunotherapy: Secondary | ICD-10-CM

## 2013-07-17 DIAGNOSIS — Z5111 Encounter for antineoplastic chemotherapy: Secondary | ICD-10-CM

## 2013-07-17 DIAGNOSIS — E119 Type 2 diabetes mellitus without complications: Secondary | ICD-10-CM

## 2013-07-17 LAB — BASIC METABOLIC PANEL (CC13)
ANION GAP: 9 meq/L (ref 3–11)
BUN: 9.3 mg/dL (ref 7.0–26.0)
CO2: 26 mEq/L (ref 22–29)
Calcium: 10.2 mg/dL (ref 8.4–10.4)
Chloride: 104 mEq/L (ref 98–109)
Creatinine: 0.8 mg/dL (ref 0.6–1.1)
Glucose: 167 mg/dl — ABNORMAL HIGH (ref 70–140)
POTASSIUM: 4.2 meq/L (ref 3.5–5.1)
SODIUM: 139 meq/L (ref 136–145)

## 2013-07-17 LAB — CBC WITH DIFFERENTIAL/PLATELET
BASO%: 0.5 % (ref 0.0–2.0)
BASOS ABS: 0 10*3/uL (ref 0.0–0.1)
EOS%: 1.7 % (ref 0.0–7.0)
Eosinophils Absolute: 0.1 10*3/uL (ref 0.0–0.5)
HEMATOCRIT: 35.4 % (ref 34.8–46.6)
HEMOGLOBIN: 11.5 g/dL — AB (ref 11.6–15.9)
LYMPH%: 25.8 % (ref 14.0–49.7)
MCH: 31.7 pg (ref 25.1–34.0)
MCHC: 32.6 g/dL (ref 31.5–36.0)
MCV: 97.2 fL (ref 79.5–101.0)
MONO#: 0.4 10*3/uL (ref 0.1–0.9)
MONO%: 10.9 % (ref 0.0–14.0)
NEUT#: 2.5 10*3/uL (ref 1.5–6.5)
NEUT%: 61.1 % (ref 38.4–76.8)
Platelets: 174 10*3/uL (ref 145–400)
RBC: 3.64 10*6/uL — ABNORMAL LOW (ref 3.70–5.45)
RDW: 17.1 % — AB (ref 11.2–14.5)
WBC: 4.1 10*3/uL (ref 3.9–10.3)
lymph#: 1 10*3/uL (ref 0.9–3.3)

## 2013-07-17 MED ORDER — DEXAMETHASONE SODIUM PHOSPHATE 20 MG/5ML IJ SOLN
INTRAMUSCULAR | Status: AC
  Filled 2013-07-17: qty 5

## 2013-07-17 MED ORDER — DEXAMETHASONE SODIUM PHOSPHATE 20 MG/5ML IJ SOLN
20.0000 mg | Freq: Once | INTRAMUSCULAR | Status: AC
  Administered 2013-07-17: 20 mg via INTRAVENOUS

## 2013-07-17 MED ORDER — SODIUM CHLORIDE 0.9 % IV SOLN
INTRAVENOUS | Status: DC
  Administered 2013-07-17: 12:00:00 via INTRAVENOUS

## 2013-07-17 MED ORDER — SODIUM CHLORIDE 0.9 % IV SOLN
400.0000 mg | Freq: Once | INTRAVENOUS | Status: AC
  Administered 2013-07-17: 400 mg via INTRAVENOUS
  Filled 2013-07-17: qty 20

## 2013-07-17 MED ORDER — ONDANSETRON 8 MG/50ML IVPB (CHCC)
8.0000 mg | Freq: Once | INTRAVENOUS | Status: AC
  Administered 2013-07-17: 8 mg via INTRAVENOUS

## 2013-07-17 MED ORDER — ONDANSETRON 8 MG/NS 50 ML IVPB
INTRAVENOUS | Status: AC
  Filled 2013-07-17: qty 8

## 2013-07-17 MED ORDER — BORTEZOMIB CHEMO SQ INJECTION 3.5 MG (2.5MG/ML)
1.7500 mg | Freq: Once | INTRAMUSCULAR | Status: AC
  Administered 2013-07-17: 1.75 mg via SUBCUTANEOUS
  Filled 2013-07-17: qty 1.75

## 2013-07-17 NOTE — Patient Instructions (Signed)
[facility] Discharge Instructions for Patients Receiving Chemotherapy  Today you received the following chemotherapy agents CYTOXAN and VELCADE To help prevent nausea and vomiting after your treatment, we encourage you to take your nausea medication as prescribed.  If you develop nausea and vomiting that is not controlled by your nausea medication, call the clinic.   BELOW ARE SYMPTOMS THAT SHOULD BE REPORTED IMMEDIATELY:  *FEVER GREATER THAN 100.5 F  *CHILLS WITH OR WITHOUT FEVER  NAUSEA AND VOMITING THAT IS NOT CONTROLLED WITH YOUR NAUSEA MEDICATION  *UNUSUAL SHORTNESS OF BREATH  *UNUSUAL BRUISING OR BLEEDING  TENDERNESS IN MOUTH AND THROAT WITH OR WITHOUT PRESENCE OF ULCERS  *URINARY PROBLEMS  *BOWEL PROBLEMS  UNUSUAL RASH Items with * indicate a potential emergency and should be followed up as soon as possible.  Feel free to call the clinic you have any questions or concerns. The clinic phone number is [phone].

## 2013-07-17 NOTE — Telephone Encounter (Signed)
gv adn printed appt sched and avs for pt for March....emailed Dr. Juliann Mule regarding to tx/est/lab ono his vacation....will call pt with appt

## 2013-07-17 NOTE — Telephone Encounter (Signed)
s.w. pt and advised on 4.1.15 appt....per Dr. Juliann Mule ok to sched with AJ...pt ok and aware

## 2013-07-19 NOTE — Progress Notes (Signed)
[place], [place] [address], [address] [place] [address]  DIAGNOSIS: Multiple myeloma - Plan: CBC with Differential, Basic metabolic panel (Bmet) - [facility], Magnesium - [facility], CBC with Differential, Magnesium - [facility], Basic metabolic panel (Bmet) - [facility], IgG, IgA, IgM, Kappa/lambda light chains  Chief Complaint  Patient presents with  . Multiple Myeloma   DIAGNOSIS:  1. Multiple myeloma.  2. Anemia secondary to vitamin B12 deficiency.   PAST THERAPY:  Revlimid (08/2009-08/2012)  Zometa (02/2010 - 02/2012)  Velcade (05/2011 -present)  Cytoxan 08/2012 - present)  Decadron (08/2012 - present)   CURRENT THERAPY:  1. Velcade, Cytoxan and Decadron every 2 weeks from 08/29/2012.  2. Vitamin B12 mg IM monthly.  3. Aranesp 300 mcg subcu every 2 weeks for hemoglobin less than or equal to 10.   Interim History: The patient is a pleasant 80 y.o. female who presented for follow up visit. She was last seen by me on 06/26/2013. Today, she is accompanied by her daughter.    She was recently discharged on 06/14/2013 following a ten-day hospitalization due to acute GIB thought to be secondary to diverticulosis and hemorrhoids.  She received received a total of 4 units of blood.  She has been deconditioned since this admission and has required physical therapy.   Prior to that, she was recently discharged on 03/28/2013 following a 5-day admission secondary to mechanical fall she had at home resulting in left sided rib fractures (7th, 8th and 9th ribs) and findings concerning for PNA.  She was discharged to the [facility] and completed physical rehabilitation on 04/19/2013.    Today, she feels as if her strength is back to her baseline. She denies  focal neurological symptoms, headaches or visual changes, abdominal or urinary. Her appetite and weight is stable. Patient had double and blurry vision and had successful cataract  surgery this past September.  She still  has mild diarrhea for which he takes Imodium. She reported pain in back,neck and shoulders; numbness in fingers and feet, and dry skin. The patient denied fever, chills, night sweats, change in appetite or weight. She denied odynophagia or dysphagia. No chest pain, palpitations, abdominal pain, vomiting, constipation, hematochezia. The patient denied dysuria, nocturia, polyuria, hematuria, myalgia, tingling, psychiatric problems.   MEDICAL HISTORY: Past Medical History  Diagnosis Date  . Hypertension   . Diabetes mellitus   . Anemia   . Osteoporosis   . Dyslipidemia   . Myocardial infarction   . colon ca dx'd 2003    surg only  . Multiple myeloma dx'd 2009    oral chemo ongoing  . Coronary artery disease   . Shortness of breath   . GERD (gastroesophageal reflux disease)   . Wears glasses     INTERIM HISTORY: has Multiple myeloma; Anemia of chronic disease; Magnesium deficiency; Diabetes mellitus; Hypertension; Coronary artery disease with history of MI; Fall; Fracture of multiple ribs; PNA (pneumonia); Thrombocytopenia, unspecified; Hyponatremia; Moderate protein-calorie malnutrition; Hypomagnesemia; Hyperlipidemia; Hypokalemia; GI bleed; Acute GI bleeding; Chronic combined systolic and diastolic congestive heart failure; Abdominal pain, epigastric ventral hernia; Hiatal hernia; and History of colon cancer on her problem list.    ALLERGIES:  is allergic to gemfibrozil; nifedipine; and questran.  MEDICATIONS: has a current medication list which includes the following prescription(s): acetaminophen, acyclovir, calcium carbonate, carvedilol, vitamin d-3, cyanocobalamin, cyclobenzaprine, hydrocodone-acetaminophen, hydrocortisone, lisinopril, loperamide, magnesium oxide, metformin, multivitamin with minerals, pantoprazole, polyethyl glycol-propyl glycol, prochlorperazine, sodium fluoride, and  spironolactone, and the following Facility-Administered  Medications: darbepoetin.  SURGICAL HISTORY:  Past Surgical History  Procedure Laterality Date  . Coronary artery bypass graft  08/2001    4 vessel  . Colon surgery  2003  . Tonsillectomy    . Appendectomy    . Abdominal hysterectomy    . Back surgery    . Eye surgery      cataracts  . Direct laryngoscopy with radiaesse injection Left 08/13/2012    Procedure: LEFT VOCAL CORD RADIESSE AUGMENTATION LARYNGOSCOPY ;  Surgeon: Jodi Marble, MD;  Location: [facility];  Service: ENT;  Laterality: Left;  . Colonoscopy N/A 06/13/2013    Procedure: COLONOSCOPY;  Surgeon: Jeryl Columbia, MD;  Location: [facility];  Service: Endoscopy;  Laterality: N/A;   Problem List:  1. Multiple myeloma, initially presenting as an IgA kappa monoclonal gammopathy in February 2009. 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 This was felt to be an adverse prognostic determinant. Bone marrow on 08/31/2007 showed 46% plasma cells. A repeat bone marrow on 08/05/2009 showed 73% plasma cells. Treatment with Revlimid was started in April 2011. Zometa was started in October 2011 and 2 years of treatment were concluded on 02/23/2012. We had previously started Aranesp for the patient's anemia in May of 2010. Most recent metastatic bone survey was on 08/05/2009. Other x-rays since then are available. Maximum IgA level was 2080 on 08/11/2009. Subcutaneous Velcade was added to the patient's treatment program on 06/14/2011 because of a rising IgA level 1080 on 06/02/2011. The patient had been receiving Velcade every 2 weeks in combination with Revlimid during the early months of 2014. The patient continues to demonstrate progressive disease as evidenced by rising IgA and serum kappa levels. Revlimid was discontinued in April 2014. The patient had received Revlimid for about 3 years from April 2011 through April 2014. As of 08/29/2012, the patient is now receiving Velcade, Cytoxan and Decadron every 2 weeks. The patient  has also been receiving Aranesp 300 mcg subcu every 2 weeks for hemoglobin less than or equal to 10.  2. Anemia secondary to vitamin B12 deficiency. The patient had been on oral vitamin B12. However, her vitamin B12 level fell to 284 on 07/26/2011 and vitamin B12 shots 1000 mcg given IM every month was resumed on 09/06/2011.  3. Anemia secondary to iron deficiency.   REVIEW OF SYSTEMS:   Constitutional: Denies fevers, chills or abnormal weight loss Eyes: Denies blurriness of vision Ears, nose, mouth, throat, and face: Denies mucositis or sore throat Respiratory: Denies cough, dyspnea or wheezes Cardiovascular: Denies palpitation, chest discomfort or lower extremity swelling Gastrointestinal:  Denies nausea, heartburn;  positive for constipation Skin: Denies abnormal skin rashes Lymphatics: Denies new lymphadenopathy or easy bruising Neurological:Denies numbness, tingling or new weaknesses Behavioral/Psych: Mood is stable, no new changes  All other systems were reviewed with the patient and are negative.  PHYSICAL EXAMINATION: ECOG PERFORMANCE STATUS: 1 - Symptomatic but completely ambulatory  Blood pressure 116/48, pulse 75, temperature 98.8 F (37.1 C), temperature source Oral, resp. rate 19, height 4' 9"  (1.448 m), weight 98 lb 12.8 oz (44.815 kg).  GENERAL:alert, no distress and comfortable; Diminished hearing; Kyphotic SKIN: skin color, texture, turgor are normal, no rashes or significant lesions  EYES: normal, Conjunctiva are pink and non-injected, sclera clear OROPHARYNX:no exudate, no erythema and lips, buccal mucosa, and tongue normal  NECK: supple, thyroid normal size, non-tender, without nodularity LYMPH:  no palpable lymphadenopathy in the cervical, axillary or supraclavicular LUNGS: clear  to auscultation and percussion with normal breathing effort HEART: regular rate & rhythm and no murmurs and no lower extremity edema ABDOMEN:abdomen soft, non-tender and normal bowel  sounds Musculoskeletal:no cyanosis of digits and no clubbing  NEURO: alert & oriented x 3 with fluent speech, no focal motor/sensory deficits; cautious gait with a walker.   Lab Results  Component Value Date   WBC 4.1 07/17/2013   HGB 11.5* 07/17/2013   HCT 35.4 07/17/2013   MCV 97.2 07/17/2013   PLT 174 07/17/2013   NEUTROABS 2.5 07/17/2013      Chemistry      Component Value Date/Time   NA 139 07/17/2013 0955   NA 137 06/12/2013 2235   K 4.2 07/17/2013 0955   K 4.1 06/12/2013 2235   CL 100 06/12/2013 2235   CL 101 10/18/2012 1051   CO2 26 07/17/2013 0955   CO2 27 06/12/2013 2235   BUN 9.3 07/17/2013 0955   BUN 8 06/12/2013 2235   CREATININE 0.8 07/17/2013 0955   CREATININE 0.63 06/12/2013 2235   CREATININE 0.69 08/11/2009 1145      Component Value Date/Time   CALCIUM 10.2 07/17/2013 0955   CALCIUM 8.7 06/12/2013 2235   ALKPHOS 54 06/26/2013 1035   ALKPHOS 61 06/06/2013 0957   AST 16 06/26/2013 1035   AST 13 06/06/2013 0957   ALT 8 06/26/2013 1035   ALT 9 06/06/2013 0957   BILITOT 0.37 06/26/2013 1035   BILITOT 0.4 06/06/2013 0957     RADIOGRAPHIC STUDIES: METASTATIC BONE SURVEY 11/01/2012 Comparison: 08/05/2009 Correlation: Chest and abdominal radiographs 09/05/2012, CT chest 07/25/2012 Findings: Bones appear demineralized. Extensive scattered atherosclerotic calcifications. Post CABG. Severe multilevel degenerative disc and facet disease changes of the cervicothoracic and lumbar spine. Prior vertebroplasties at T11, T12, L1. Endplate compression deformities at T6, T7, and T10 unchanged. Inferior endplate compression deformity L3 new since radiographs 2011. No definite new thoracic compression fracture. Bowel gas projects over the right inferior pubic ramus and obturator foramen question obturator hernia. Bilateral  acromioclavicular and glenohumeral joint degenerative changes with bilateral chronic rotator cuff tears. Degenerative changes bilateral knees. Hiatal hernia. Callus identified at a probable healing  fracture of a lateral lower left rib, new. IMPRESSION: Osseous demineralization with multiple thoracolumbar compression fractures and prior vertebroplasties. Inferior endplate compression deformity L3 new since 5809 though of uncertain age. No definite destructive lytic lesion identified to suggest new multiple myelomatous site. Scattered degenerative changes as above. Small hiatal hernia. Extensive atherosclerotic disease. Question right obturator hernia.   ASSESSMENT: [place] 80 y.o. female with a history of Multiple myeloma - Plan: CBC with Differential, Basic metabolic panel (Bmet) - [facility], Magnesium - [facility], CBC with Differential, Magnesium - [facility], Basic metabolic panel (Bmet) - [facility], IgG, IgA, IgM, Kappa/lambda light chains   PLAN:  1. Multiple myeloma. --She has now recovered from her recent hospitalization for GIB and has a stable hemoglobin.  Her counts appear to have recovered. We will continue her chemotherapy today and continue every 2 weeks.Her IgA level and serum light chains have been stable recently on current regiment overall. Her last IgA was 536 down from 632 on 12/17.  --We will  proceed with her chemotherapy with Velcade subcutaneous, Cytoxan 400 mg, IV Decadron 20 mg IV (Day #1, cycle #46) in 2 weeks on 08/01/2013. She has been provided a handout explaining common side-effects on prior visits.   2. S/p recent GIB. --Her hemoglobin is stable.   3. Multiple rib fractures (L 7th, 8th, 9th) --  Continue Hydrocodone-acetaminophen 10-325 q 4 hours as need.    4. Follow up. RTC in 4 weeks for evaluation for another treatment with CBC, CMET, IgA level, and serum light chains. She will have her next treatment in 2 weeks with labs prior to this visit.   All questions were answered. The patient knows to call the clinic with any problems, questions or concerns. We can certainly see the patient much sooner if necessary.  I spent 15 minutes counseling the patient face to face. The  total time spent in the appointment was 25 minutes.    Iniya Matzek, MD 07/19/2013 5:34 AM

## 2013-07-25 ENCOUNTER — Other Ambulatory Visit: Payer: Self-pay | Admitting: Internal Medicine

## 2013-07-26 ENCOUNTER — Telehealth: Payer: Self-pay

## 2013-07-26 NOTE — Telephone Encounter (Signed)
[place] lvm asking if there is any financial assistance available. Call forwarded to financial advocate.

## 2013-07-31 ENCOUNTER — Other Ambulatory Visit (HOSPITAL_BASED_OUTPATIENT_CLINIC_OR_DEPARTMENT_OTHER): Payer: Medicare Other

## 2013-07-31 ENCOUNTER — Other Ambulatory Visit: Payer: Self-pay | Admitting: *Deleted

## 2013-07-31 ENCOUNTER — Other Ambulatory Visit: Payer: Self-pay | Admitting: Internal Medicine

## 2013-07-31 ENCOUNTER — Ambulatory Visit (HOSPITAL_BASED_OUTPATIENT_CLINIC_OR_DEPARTMENT_OTHER): Payer: Medicare Other

## 2013-07-31 VITALS — BP 101/53 | HR 65 | Temp 97.7°F

## 2013-07-31 DIAGNOSIS — C9000 Multiple myeloma not having achieved remission: Secondary | ICD-10-CM

## 2013-07-31 DIAGNOSIS — Z5112 Encounter for antineoplastic immunotherapy: Secondary | ICD-10-CM

## 2013-07-31 DIAGNOSIS — D519 Vitamin B12 deficiency anemia, unspecified: Secondary | ICD-10-CM

## 2013-07-31 DIAGNOSIS — D518 Other vitamin B12 deficiency anemias: Secondary | ICD-10-CM

## 2013-07-31 DIAGNOSIS — Z5111 Encounter for antineoplastic chemotherapy: Secondary | ICD-10-CM

## 2013-07-31 LAB — BASIC METABOLIC PANEL (CC13)
Anion Gap: 11 mEq/L (ref 3–11)
BUN: 11.3 mg/dL (ref 7.0–26.0)
CALCIUM: 10.1 mg/dL (ref 8.4–10.4)
CO2: 26 mEq/L (ref 22–29)
Chloride: 101 mEq/L (ref 98–109)
Creatinine: 0.8 mg/dL (ref 0.6–1.1)
Glucose: 179 mg/dl — ABNORMAL HIGH (ref 70–140)
POTASSIUM: 4.2 meq/L (ref 3.5–5.1)
SODIUM: 138 meq/L (ref 136–145)

## 2013-07-31 LAB — CBC WITH DIFFERENTIAL/PLATELET
BASO%: 1 % (ref 0.0–2.0)
Basophils Absolute: 0 10*3/uL (ref 0.0–0.1)
EOS%: 1.4 % (ref 0.0–7.0)
Eosinophils Absolute: 0 10*3/uL (ref 0.0–0.5)
HCT: 33.3 % — ABNORMAL LOW (ref 34.8–46.6)
HGB: 11 g/dL — ABNORMAL LOW (ref 11.6–15.9)
LYMPH%: 22.5 % (ref 14.0–49.7)
MCH: 32.3 pg (ref 25.1–34.0)
MCHC: 33.1 g/dL (ref 31.5–36.0)
MCV: 97.7 fL (ref 79.5–101.0)
MONO#: 0.4 10*3/uL (ref 0.1–0.9)
MONO%: 12.4 % (ref 0.0–14.0)
NEUT#: 1.9 10*3/uL (ref 1.5–6.5)
NEUT%: 62.7 % (ref 38.4–76.8)
PLATELETS: 146 10*3/uL (ref 145–400)
RBC: 3.41 10*6/uL — AB (ref 3.70–5.45)
RDW: 17.9 % — AB (ref 11.2–14.5)
WBC: 3.1 10*3/uL — ABNORMAL LOW (ref 3.9–10.3)
lymph#: 0.7 10*3/uL — ABNORMAL LOW (ref 0.9–3.3)

## 2013-07-31 LAB — MAGNESIUM (CC13): MAGNESIUM: 1.2 mg/dL — AB (ref 1.5–2.5)

## 2013-07-31 MED ORDER — CYANOCOBALAMIN 1000 MCG/ML IJ SOLN
1000.0000 ug | Freq: Once | INTRAMUSCULAR | Status: AC
  Administered 2013-07-31: 1000 ug via INTRAMUSCULAR

## 2013-07-31 MED ORDER — SODIUM CHLORIDE 0.9 % IV SOLN
400.0000 mg | Freq: Once | INTRAVENOUS | Status: AC
  Administered 2013-07-31: 400 mg via INTRAVENOUS
  Filled 2013-07-31: qty 20

## 2013-07-31 MED ORDER — SODIUM CHLORIDE 0.9 % IV SOLN
2.0000 g | Freq: Once | INTRAVENOUS | Status: AC
  Administered 2013-07-31: 2 g via INTRAVENOUS
  Filled 2013-07-31: qty 4

## 2013-07-31 MED ORDER — ONDANSETRON 8 MG/50ML IVPB (CHCC)
8.0000 mg | Freq: Once | INTRAVENOUS | Status: AC
  Administered 2013-07-31: 8 mg via INTRAVENOUS

## 2013-07-31 MED ORDER — BORTEZOMIB CHEMO SQ INJECTION 3.5 MG (2.5MG/ML)
1.7500 mg | Freq: Once | INTRAMUSCULAR | Status: AC
  Administered 2013-07-31: 1.75 mg via SUBCUTANEOUS
  Filled 2013-07-31: qty 1.75

## 2013-07-31 MED ORDER — DEXAMETHASONE SODIUM PHOSPHATE 20 MG/5ML IJ SOLN
INTRAMUSCULAR | Status: AC
  Filled 2013-07-31: qty 5

## 2013-07-31 MED ORDER — CYANOCOBALAMIN 1000 MCG/ML IJ SOLN
INTRAMUSCULAR | Status: AC
  Filled 2013-07-31: qty 1

## 2013-07-31 MED ORDER — DEXAMETHASONE SODIUM PHOSPHATE 20 MG/5ML IJ SOLN
20.0000 mg | Freq: Once | INTRAMUSCULAR | Status: AC
  Administered 2013-07-31: 20 mg via INTRAVENOUS

## 2013-07-31 MED ORDER — ONDANSETRON 8 MG/NS 50 ML IVPB
INTRAVENOUS | Status: AC
  Filled 2013-07-31: qty 8

## 2013-07-31 NOTE — Patient Instructions (Signed)
[facility] Discharge Instructions for Patients Receiving Chemotherapy  Today you received the following chemotherapy agents Velcade/Cytoxan.  To help prevent nausea and vomiting after your treatment, we encourage you to take your nausea medication as prescribed.   If you develop nausea and vomiting that is not controlled by your nausea medication, call the clinic.   BELOW ARE SYMPTOMS THAT SHOULD BE REPORTED IMMEDIATELY:  *FEVER GREATER THAN 100.5 F  *CHILLS WITH OR WITHOUT FEVER  NAUSEA AND VOMITING THAT IS NOT CONTROLLED WITH YOUR NAUSEA MEDICATION  *UNUSUAL SHORTNESS OF BREATH  *UNUSUAL BRUISING OR BLEEDING  TENDERNESS IN MOUTH AND THROAT WITH OR WITHOUT PRESENCE OF ULCERS  *URINARY PROBLEMS  *BOWEL PROBLEMS  UNUSUAL RASH Items with * indicate a potential emergency and should be followed up as soon as possible.  Feel free to call the clinic you have any questions or concerns. The clinic phone number is [phone].    

## 2013-08-06 ENCOUNTER — Encounter (HOSPITAL_COMMUNITY): Payer: Self-pay | Admitting: Emergency Medicine

## 2013-08-06 ENCOUNTER — Emergency Department (HOSPITAL_COMMUNITY)
Admission: EM | Admit: 2013-08-06 | Discharge: 2013-08-06 | Disposition: A | Discharge status: Home / Self Care | Payer: Medicare Other | Attending: Emergency Medicine | Admitting: Emergency Medicine

## 2013-08-06 DIAGNOSIS — I2510 Atherosclerotic heart disease of native coronary artery without angina pectoris: Secondary | ICD-10-CM | POA: Insufficient documentation

## 2013-08-06 DIAGNOSIS — Z951 Presence of aortocoronary bypass graft: Secondary | ICD-10-CM | POA: Insufficient documentation

## 2013-08-06 DIAGNOSIS — Z8739 Personal history of other diseases of the musculoskeletal system and connective tissue: Secondary | ICD-10-CM | POA: Insufficient documentation

## 2013-08-06 DIAGNOSIS — Z85038 Personal history of other malignant neoplasm of large intestine: Secondary | ICD-10-CM | POA: Insufficient documentation

## 2013-08-06 DIAGNOSIS — E119 Type 2 diabetes mellitus without complications: Secondary | ICD-10-CM | POA: Insufficient documentation

## 2013-08-06 DIAGNOSIS — Z862 Personal history of diseases of the blood and blood-forming organs and certain disorders involving the immune mechanism: Secondary | ICD-10-CM | POA: Insufficient documentation

## 2013-08-06 DIAGNOSIS — I252 Old myocardial infarction: Secondary | ICD-10-CM | POA: Insufficient documentation

## 2013-08-06 DIAGNOSIS — K219 Gastro-esophageal reflux disease without esophagitis: Secondary | ICD-10-CM | POA: Insufficient documentation

## 2013-08-06 DIAGNOSIS — K625 Hemorrhage of anus and rectum: Secondary | ICD-10-CM | POA: Insufficient documentation

## 2013-08-06 DIAGNOSIS — I10 Essential (primary) hypertension: Secondary | ICD-10-CM | POA: Insufficient documentation

## 2013-08-06 DIAGNOSIS — Z79899 Other long term (current) drug therapy: Secondary | ICD-10-CM | POA: Insufficient documentation

## 2013-08-06 DIAGNOSIS — IMO0002 Reserved for concepts with insufficient information to code with codable children: Secondary | ICD-10-CM | POA: Insufficient documentation

## 2013-08-06 LAB — TYPE AND SCREEN
ABO/RH(D): O POS
ANTIBODY SCREEN: NEGATIVE

## 2013-08-06 LAB — COMPREHENSIVE METABOLIC PANEL
ALT: 8 U/L (ref 0–35)
AST: 17 U/L (ref 0–37)
Albumin: 3.3 g/dL — ABNORMAL LOW (ref 3.5–5.2)
Alkaline Phosphatase: 54 U/L (ref 39–117)
BUN: 17 mg/dL (ref 6–23)
CALCIUM: 9.4 mg/dL (ref 8.4–10.5)
CO2: 23 mEq/L (ref 19–32)
Chloride: 97 mEq/L (ref 96–112)
Creatinine, Ser: 0.66 mg/dL (ref 0.50–1.10)
GFR calc non Af Amer: 78 mL/min — ABNORMAL LOW (ref >90)
Glucose, Bld: 170 mg/dL — ABNORMAL HIGH (ref 70–99)
Potassium: 4.3 mEq/L (ref 3.7–5.3)
Sodium: 135 mEq/L — ABNORMAL LOW (ref 137–147)
TOTAL PROTEIN: 7 g/dL (ref 6.0–8.3)
Total Bilirubin: 0.2 mg/dL — ABNORMAL LOW (ref 0.3–1.2)

## 2013-08-06 LAB — TROPONIN I

## 2013-08-06 LAB — CBC
HCT: 31.3 % — ABNORMAL LOW (ref 36.0–46.0)
Hemoglobin: 10.3 g/dL — ABNORMAL LOW (ref 12.0–15.0)
MCH: 31.9 pg (ref 26.0–34.0)
MCHC: 32.9 g/dL (ref 30.0–36.0)
MCV: 96.9 fL (ref 78.0–100.0)
PLATELETS: 134 10*3/uL — AB (ref 150–400)
RBC: 3.23 MIL/uL — ABNORMAL LOW (ref 3.87–5.11)
RDW: 16 % — ABNORMAL HIGH (ref 11.5–15.5)
WBC: 4.2 10*3/uL (ref 4.0–10.5)

## 2013-08-06 LAB — POC OCCULT BLOOD, ED: FECAL OCCULT BLD: POSITIVE — AB

## 2013-08-06 LAB — I-STAT CG4 LACTIC ACID, ED: Lactic Acid, Venous: 1.85 mmol/L (ref 0.5–2.2)

## 2013-08-06 MED ORDER — SODIUM CHLORIDE 0.9 % IV BOLUS (SEPSIS)
500.0000 mL | INTRAVENOUS | Status: AC
  Administered 2013-08-06: 500 mL via INTRAVENOUS

## 2013-08-06 NOTE — ED Provider Notes (Signed)
CSN: [id number]     Arrival date & time 08/06/13  2038 History   First MD Initiated Contact with Patient 08/06/13 2140     Chief Complaint  Patient presents with  . Rectal Bleeding      HPI  Patient presents with concern of rectal bleeding. The past day she has had 2 episodes of bright red blood with defecation, though no bleeding when not defecating. There is no associated abdominal pain chest pain, lightheadedness or dyspnea. No syncope, no vomiting, no nausea.  Patient has multiple medical problems, including multiple myeloma, prior episodes of GI bleed. Her GI bleed was 2 months ago, and she was evaluated here with multiple labs, imaging. Patient has history of diverticulosis. She has not had colonoscopy due to age, comorbidities. She has seen her gastroenterologist, oncologist, and primary care physician since that admission.   Past Medical History  Diagnosis Date  . Hypertension   . Diabetes mellitus   . Anemia   . Osteoporosis   . Dyslipidemia   . Myocardial infarction   . colon ca dx'd 2003    surg only  . Multiple myeloma dx'd 2009    oral chemo ongoing  . Coronary artery disease   . Shortness of breath   . GERD (gastroesophageal reflux disease)   . Wears glasses    Past Surgical History  Procedure Laterality Date  . Coronary artery bypass graft  08/2001    4 vessel  . Colon surgery  2003  . Tonsillectomy    . Appendectomy    . Abdominal hysterectomy    . Back surgery    . Eye surgery      cataracts  . Direct laryngoscopy with radiaesse injection Left 08/13/2012    Procedure: LEFT VOCAL CORD RADIESSE AUGMENTATION LARYNGOSCOPY ;  Surgeon: Jodi Marble, MD;  Location: [facility];  Service: ENT;  Laterality: Left;  . Colonoscopy N/A 06/13/2013    Procedure: COLONOSCOPY;  Surgeon: Jeryl Columbia, MD;  Location: [facility];  Service: Endoscopy;  Laterality: N/A;   Family History  Problem Relation Age of Onset  . Heart disease Brother   .  Diabetes Brother    History  Substance Use Topics  . Smoking status: Never Smoker   . Smokeless tobacco: Never Used  . Alcohol Use: No   OB History   Grav Para Term Preterm Abortions TAB SAB Ect Mult Living                 Review of Systems  Constitutional:       Per HPI, otherwise negative  HENT:       Per HPI, otherwise negative  Respiratory:       Per HPI, otherwise negative  Cardiovascular:       Per HPI, otherwise negative  Gastrointestinal: Negative for vomiting.  Endocrine:       Negative aside from HPI  Genitourinary:       Neg aside from HPI   Musculoskeletal:       Per HPI, otherwise negative  Skin: Negative.   Neurological: Negative for syncope.      Allergies  Gemfibrozil; Nifedipine; and Questran  Home Medications   Current Outpatient Rx  Name  Route  Sig  Dispense  Refill  . acetaminophen (TYLENOL) 650 MG CR tablet   Oral   Take 650 mg by mouth every 8 (eight) hours as needed for pain.         Marland Kitchen acyclovir (ZOVIRAX) 400 MG tablet  take 1 tablet by mouth twice a day   60 tablet   3   . calcium carbonate (OS-CAL) 1250 MG chewable tablet   Oral   Chew 1 tablet by mouth daily.         . carvedilol (COREG) 25 MG tablet   Oral   Take 25 mg by mouth 2 (two) times daily with a meal.         . Cholecalciferol (VITAMIN D-3) 5000 UNITS TABS   Oral   Take by mouth at bedtime.          . cyanocobalamin (,VITAMIN B-12,) 1000 MCG/ML injection   Intramuscular   Inject 1,000 mcg into the muscle every 30 (thirty) days.         Marland Kitchen HYDROcodone-acetaminophen (NORCO) 10-325 MG per tablet      every 4 (four) hours as needed. Take one tablet by mouth every 6 hours as needed for pain         . hydrocortisone (ANUSOL-HC) 25 MG suppository   Rectal   Place 1 suppository (25 mg total) rectally 2 (two) times daily.   12 suppository   0   . lisinopril (PRINIVIL,ZESTRIL) 2.5 MG tablet   Oral   Take 2.5 mg by mouth every other day.          . magnesium oxide (MAG-OX) 400 MG tablet   Oral   Take 400 mg by mouth daily.         . metFORMIN (GLUCOPHAGE) 500 MG tablet   Oral   Take 500 mg by mouth 2 (two) times daily with a meal.           . Multiple Vitamin (MULTIVITAMIN WITH MINERALS) TABS   Oral   Take 1 tablet by mouth daily.         . pantoprazole (PROTONIX) 40 MG tablet   Oral   Take 40 mg by mouth daily.           Vladimir Faster Glycol-Propyl Glycol (SYSTANE OP)   Both Eyes   Place 2 drops into both eyes at bedtime.          . prochlorperazine (COMPAZINE) 10 MG tablet   Oral   Take 1 tablet (10 mg total) by mouth every 6 (six) hours as needed. For nausea   30 tablet   3   . spironolactone (ALDACTONE) 25 MG tablet   Oral   Take 25 mg by mouth every other day.          . cyclobenzaprine (FLEXERIL) 10 MG tablet   Oral   Take 1 tablet (10 mg total) by mouth 3 (three) times daily as needed. For muscle spasm   90 tablet   1   . loperamide (IMODIUM) 2 MG capsule   Oral   Take by mouth as needed for diarrhea or loose stools.          BP 107/63  Pulse 85  Temp(Src) 98.3 F (36.8 C) (Oral)  Resp 14  Ht 4' 8"  (1.422 m)  Wt 99 lb (44.906 kg)  BMI 22.21 kg/m2  SpO2 94% Physical Exam  Nursing note and vitals reviewed. Constitutional: She is oriented to person, place, and time. She appears well-developed and well-nourished. No distress.  HENT:  Head: Normocephalic and atraumatic.  Eyes: Conjunctivae and EOM are normal.  Cardiovascular: Normal rate and regular rhythm.   Pulmonary/Chest: Effort normal and breath sounds normal. No stridor. No respiratory distress.  Abdominal: She exhibits no distension.  Genitourinary: Rectal exam shows no external hemorrhoid, no internal hemorrhoid, no fissure, no mass, no tenderness and anal tone normal. Guaiac positive stool.  Musculoskeletal: She exhibits no edema.  Neurological: She is alert and oriented to person, place, and time. No cranial nerve deficit.   Skin: Skin is warm and dry.  Psychiatric: She has a normal mood and affect.    ED Course  Procedures (including critical care time) Labs Review Labs Reviewed  CBC - Abnormal; Notable for the following:    RBC 3.23 (*)    Hemoglobin 10.3 (*)    HCT 31.3 (*)    RDW 16.0 (*)    Platelets 134 (*)    All other components within normal limits  COMPREHENSIVE METABOLIC PANEL - Abnormal; Notable for the following:    Sodium 135 (*)    Glucose, Bld 170 (*)    Albumin 3.3 (*)    Total Bilirubin 0.2 (*)    GFR calc non Af Amer 78 (*)    All other components within normal limits  POC OCCULT BLOOD, ED - Abnormal; Notable for the following:    Fecal Occult Bld POSITIVE (*)    All other components within normal limits  TROPONIN I  POC OCCULT BLOOD, ED  I-STAT TROPOININ, ED  I-STAT CG4 LACTIC ACID, ED  TYPE AND SCREEN   Imaging Review I reviewed  imaging from the patient's recent evaluation.    EKG Interpretation   Date/Time:  Tuesday August 06 2013 21:27:15 EDT Ventricular Rate:  80 PR Interval:  227 QRS Duration: 92 QT Interval:  398 QTC Calculation: 459 R Axis:   -21 Text Interpretation:  Age not entered, assumed to be  80 years old for  purpose of ECG interpretation Sinus rhythm Prolonged PR interval Probable  left atrial enlargement Borderline left axis deviation Consider anterior  infarct Nonspecific T abnormalities, lateral leads ST elevation, consider  inferior injury Sinus rhythm Artifact Non-specific intra-ventricular  conduction block T wave abnormality Abnormal ekg Confirmed by Carmin Muskrat  MD [phone]) on 08/06/2013 9:40:56 PM      On repeat exam the patient's blood pressure is a normal, she has no complaints, no active bleeding.  I reviewed all findings with her and her daughter at length.  With no and I complaints, and no non defecation bleeding she was discharged to follow up with her primary care physician and gastroenterologist.  MDM   Final diagnoses:   Rectal bleeding    Patient presents with concern of rectal bleeding.  Notably, bleeding is only with stool, and she is in no distress, awake, alert, with a soft, non-peritoneal abdomen.  Patient's labs are notable for persistent anemia, though no significant change from baseline.  Patient was initially mildly hypotensive, but this improves with half liter normal saline. With this improvement, the absence of distress, the previously performed imaging, the previous diagnosis of diverticulosis she is discharged to follow up with her physicians.    Carmin Muskrat, MD 08/06/13 [phone]

## 2013-08-06 NOTE — Discharge Instructions (Signed)
As discussed, it is important that you monitor your condition carefully, and she do not hesitate to return here if you develop new, or concerning changes in her condition such as those we talked about.  Regardless, please be sure to superior physician tomorrow to insure overall appropriate ongoing care.   Bloody Stools Bloody stools often mean that there is a problem in the digestive tract. Your caregiver may use the term "melena" to describe black, tarry, and bad smelling stools or "hematochezia" to describe red or maroon-colored stools. Blood seen in the stool can be caused by bleeding anywhere along the intestinal tract.  A black stool usually means that blood is coming from the upper part of the gastrointestinal tract (esophagus, stomach, or small bowel). Passing maroon-colored stools or bright red blood usually means that blood is coming from lower down in the large bowel or the rectum. However, sometimes massive bleeding in the stomach or small intestine can cause bright red bloody stools.  Consuming black licorice, lead, iron pills, medicines containing bismuth subsalicylate, or blueberries can also cause black stools. Your caregiver can test black stools to see if blood is present. It is important that the cause of the bleeding be found. Treatment can then be started, and the problem can be corrected. Rectal bleeding may not be serious, but you should not assume everything is okay until you know the cause.It is very important to follow up with your caregiver or a specialist in gastrointestinal problems. CAUSES  Blood in the stools can come from various underlying causes.Often, the cause is not found during your first visit. Testing is often needed to discover the cause of bleeding in the gastrointestinal tract. Causes range from simple to serious or even life-threatening.Possible causes include:  Hemorrhoids.These are veins that are full of blood (engorged) in the rectum. They cause pain,  inflammation, and may bleed.  Anal fissures.These are areas of painful tearing which may bleed. They are often caused by passing hard stool.  Diverticulosis.These are pouches that form on the colon over time, with age, and may bleed significantly.  Diverticulitis.This is inflammation in areas with diverticulosis. It can cause pain, fever, and bloody stools, although bleeding is rare.  Proctitis and colitis. These are inflamed areas of the rectum or colon. They may cause pain, fever, and bloody stools.  Polyps and cancer. Colon cancer is a leading cause of preventable cancer death.It often starts out as precancerous polyps that can be removed during a colonoscopy, preventing progression into cancer. Sometimes, polyps and cancer may cause rectal bleeding.  Gastritis and ulcers.Bleeding from the upper gastrointestinal tract (near the stomach) may travel through the intestines and produce black, sometimes tarry, often bad smelling stools. In certain cases, if the bleeding is fast enough, the stools may not be black, but red and the condition may be life-threatening. SYMPTOMS  You may have stools that are bright red and bloody, that are normal color with blood on them, or that are dark black and tarry. In some cases, you may only have blood in the toilet bowl. Any of these cases need medical care. You may also have:  Pain at the anus or anywhere in the rectum.  Lightheadedness or feeling faint.  Extreme weakness.  Nausea or vomiting.  Fever. DIAGNOSIS Your caregiver may use the following methods to find the cause of your bleeding:  Taking a medical history. Age is important. Older people tend to develop polyps and cancer more often. If there is anal pain and a hard,  large stool associated with bleeding, a tear of the anus may be the cause. If blood drips into the toilet after a bowel movement, bleeding hemorrhoids may be the problem. The color and frequency of the bleeding are additional  considerations. In most cases, the medical history provides clues, but seldom the final answer.  A visual and finger (digital) exam. Your caregiver will inspect the anal area, looking for tears and hemorrhoids. A finger exam can provide information when there is tenderness or a growth inside. In men, the prostate is also examined.  Endoscopy. Several types of small, long scopes (endoscopes) are used to view the colon.  In the office, your caregiver may use a rigid, or more commonly, a flexible viewing sigmoidoscope. This exam is called flexible sigmoidoscopy. It is performed in 5 to 10 minutes.  A more thorough exam is accomplished with a colonoscope. It allows your caregiver to view the entire 5 to 6 foot long colon. Medicine to help you relax (sedative) is usually given for this exam. Frequently, a bleeding lesion may be present beyond the reach of the sigmoidoscope. So, a colonoscopy may be the best exam to start with. Both exams are usually done on an outpatient basis. This means the patient does not stay overnight in the hospital or surgery center.  An upper endoscopy may be needed to examine your stomach. Sedation is used and a flexible endoscope is put in your mouth, down to your stomach.  A barium enema X-ray. This is an X-ray exam. It uses liquid barium inserted by enema into the rectum. This test alone may not identify an actual bleeding point. X-rays highlight abnormal shadows, such as those made by lumps (tumors), diverticuli, or colitis. TREATMENT  Treatment depends on the cause of your bleeding.   For bleeding from the stomach or colon, the caregiver doing your endoscopy or colonoscopy may be able to stop the bleeding as part of the procedure.  Inflammation or infection of the colon can be treated with medicines.  Many rectal problems can be treated with creams, suppositories, or warm baths.  Surgery is sometimes needed.  Blood transfusions are sometimes needed if you have lost  a lot of blood.  For any bleeding problem, let your caregiver know if you take aspirin or other blood thinners regularly. HOME CARE INSTRUCTIONS   Take any medicines exactly as prescribed.  Keep your stools soft by eating a diet high in fiber. Prunes (1 to 3 a day) work well for many people.  Drink enough water and fluids to keep your urine clear or pale yellow.  Take sitz baths if advised. A sitz bath is when you sit in a bathtub with warm water for 10 to 15 minutes to soak, soothe, and cleanse the rectal area.  If enemas or suppositories are advised, be sure you know how to use them. Tell your caregiver if you have problems with this.  Monitor your bowel movements to look for signs of improvement or worsening. SEEK MEDICAL CARE IF:   You do not improve in the time expected.  Your condition worsens after initial improvement.  You develop any new symptoms. SEEK IMMEDIATE MEDICAL CARE IF:   You develop severe or prolonged rectal bleeding.  You vomit blood.  You feel weak or faint.  You have a fever. MAKE SURE YOU:  Understand these instructions.  Will watch your condition.  Will get help right away if you are not doing well or get worse. Document Released: 04/22/2002 Document Revised:  07/25/2011 Document Reviewed: 09/17/2010 [employer] Patient Information 2014 [place], [place].

## 2013-08-06 NOTE — ED Notes (Signed)
Pt has hx of rectal bleeding 2 months ago and started having it again today. Hypotensive systolic 80. Alert and oriented.

## 2013-08-14 ENCOUNTER — Encounter: Payer: Self-pay | Admitting: Physician Assistant

## 2013-08-14 ENCOUNTER — Encounter: Payer: Self-pay | Admitting: Oncology

## 2013-08-14 ENCOUNTER — Other Ambulatory Visit: Payer: Self-pay | Admitting: Oncology

## 2013-08-14 ENCOUNTER — Telehealth: Payer: Self-pay | Admitting: Internal Medicine

## 2013-08-14 ENCOUNTER — Other Ambulatory Visit (HOSPITAL_BASED_OUTPATIENT_CLINIC_OR_DEPARTMENT_OTHER): Payer: Medicare Other

## 2013-08-14 ENCOUNTER — Ambulatory Visit (HOSPITAL_BASED_OUTPATIENT_CLINIC_OR_DEPARTMENT_OTHER): Payer: Medicare Other

## 2013-08-14 ENCOUNTER — Ambulatory Visit (HOSPITAL_BASED_OUTPATIENT_CLINIC_OR_DEPARTMENT_OTHER): Payer: Medicare Other | Admitting: Physician Assistant

## 2013-08-14 VITALS — BP 102/67 | HR 73 | Temp 98.0°F | Resp 17 | Ht <= 58 in | Wt 100.1 lb

## 2013-08-14 DIAGNOSIS — C9000 Multiple myeloma not having achieved remission: Secondary | ICD-10-CM

## 2013-08-14 DIAGNOSIS — Z5112 Encounter for antineoplastic immunotherapy: Secondary | ICD-10-CM

## 2013-08-14 DIAGNOSIS — D638 Anemia in other chronic diseases classified elsewhere: Secondary | ICD-10-CM

## 2013-08-14 DIAGNOSIS — Z5111 Encounter for antineoplastic chemotherapy: Secondary | ICD-10-CM

## 2013-08-14 DIAGNOSIS — D518 Other vitamin B12 deficiency anemias: Secondary | ICD-10-CM

## 2013-08-14 DIAGNOSIS — R790 Abnormal level of blood mineral: Secondary | ICD-10-CM

## 2013-08-14 LAB — CBC WITH DIFFERENTIAL/PLATELET
BASO%: 1.1 % (ref 0.0–2.0)
Basophils Absolute: 0 10*3/uL (ref 0.0–0.1)
EOS%: 1.1 % (ref 0.0–7.0)
Eosinophils Absolute: 0 10*3/uL (ref 0.0–0.5)
HCT: 31 % — ABNORMAL LOW (ref 34.8–46.6)
HGB: 10.3 g/dL — ABNORMAL LOW (ref 11.6–15.9)
LYMPH%: 14.4 % (ref 14.0–49.7)
MCH: 32.9 pg (ref 25.1–34.0)
MCHC: 33.3 g/dL (ref 31.5–36.0)
MCV: 98.8 fL (ref 79.5–101.0)
MONO#: 0.4 10*3/uL (ref 0.1–0.9)
MONO%: 11 % (ref 0.0–14.0)
NEUT#: 2.8 10*3/uL (ref 1.5–6.5)
NEUT%: 72.4 % (ref 38.4–76.8)
PLATELETS: 179 10*3/uL (ref 145–400)
RBC: 3.14 10*6/uL — AB (ref 3.70–5.45)
RDW: 18.2 % — ABNORMAL HIGH (ref 11.2–14.5)
WBC: 3.8 10*3/uL — AB (ref 3.9–10.3)
lymph#: 0.6 10*3/uL — ABNORMAL LOW (ref 0.9–3.3)

## 2013-08-14 LAB — BASIC METABOLIC PANEL (CC13)
ANION GAP: 10 meq/L (ref 3–11)
BUN: 7.4 mg/dL (ref 7.0–26.0)
CALCIUM: 9.5 mg/dL (ref 8.4–10.4)
CO2: 26 meq/L (ref 22–29)
Chloride: 104 mEq/L (ref 98–109)
Creatinine: 0.7 mg/dL (ref 0.6–1.1)
Glucose: 152 mg/dl — ABNORMAL HIGH (ref 70–140)
Potassium: 4.2 mEq/L (ref 3.5–5.1)
SODIUM: 140 meq/L (ref 136–145)

## 2013-08-14 LAB — MAGNESIUM (CC13): Magnesium: 1 mg/dl — CL (ref 1.5–2.5)

## 2013-08-14 MED ORDER — DEXAMETHASONE SODIUM PHOSPHATE 20 MG/5ML IJ SOLN
INTRAMUSCULAR | Status: AC
  Filled 2013-08-14: qty 5

## 2013-08-14 MED ORDER — CYCLOPHOSPHAMIDE CHEMO INJECTION 1 GM
400.0000 mg | Freq: Once | INTRAMUSCULAR | Status: AC
  Administered 2013-08-14: 400 mg via INTRAVENOUS
  Filled 2013-08-14: qty 20

## 2013-08-14 MED ORDER — HYDROCODONE-ACETAMINOPHEN 10-325 MG PO TABS: 1.0000 | ORAL_TABLET | ORAL | Status: DC | PRN

## 2013-08-14 MED ORDER — DEXAMETHASONE SODIUM PHOSPHATE 20 MG/5ML IJ SOLN
20.0000 mg | Freq: Once | INTRAMUSCULAR | Status: AC
  Administered 2013-08-14: 20 mg via INTRAVENOUS

## 2013-08-14 MED ORDER — SODIUM CHLORIDE 0.9 % IV SOLN
2.0000 g | Freq: Once | INTRAVENOUS | Status: AC
  Administered 2013-08-14: 2 g via INTRAVENOUS
  Filled 2013-08-14: qty 4

## 2013-08-14 MED ORDER — MAGNESIUM OXIDE 400 MG PO TABS: 400.0000 mg | ORAL_TABLET | Freq: Two times a day (BID) | ORAL | Status: AC

## 2013-08-14 MED ORDER — ONDANSETRON 8 MG/NS 50 ML IVPB
INTRAVENOUS | Status: AC
  Filled 2013-08-14: qty 8

## 2013-08-14 MED ORDER — ONDANSETRON 8 MG/50ML IVPB (CHCC)
8.0000 mg | Freq: Once | INTRAVENOUS | Status: AC
  Administered 2013-08-14: 8 mg via INTRAVENOUS

## 2013-08-14 MED ORDER — BORTEZOMIB CHEMO SQ INJECTION 3.5 MG (2.5MG/ML)
1.7500 mg | Freq: Once | INTRAMUSCULAR | Status: AC
  Administered 2013-08-14: 1.75 mg via SUBCUTANEOUS
  Filled 2013-08-14: qty 1.75

## 2013-08-14 NOTE — Progress Notes (Signed)
Noted Magnesium low. Informed Adrena, PA of pt magnesium level. IV magnesium ordered and instructed pt to increase po magnesium to BID.

## 2013-08-14 NOTE — Progress Notes (Signed)
[place], MD [address] [place] [address]  DIAGNOSIS: Multiple myeloma - Plan: CBC with Differential, Basic metabolic panel (Bmet) - [facility], CBC with Differential, Comprehensive metabolic panel (Cmet) - [facility], Lactate dehydrogenase (LDH) - [facility]  Anemia of chronic disease  No chief complaint on file.  DIAGNOSIS:  1. Multiple myeloma.  2. Anemia secondary to vitamin B12 deficiency.   PAST THERAPY:  Revlimid (08/2009-08/2012)  Zometa (02/2010 - 02/2012)  Velcade (05/2011 -present)  Cytoxan 08/2012 - present)  Decadron (08/2012 - present)   CURRENT THERAPY:  1. Velcade, Cytoxan and Decadron every 2 weeks from 08/29/2012.  2. Vitamin B12 mg IM monthly.  3. Aranesp 300 mcg subcu every 2 weeks for hemoglobin less than or equal to 10.   Interim History: The patient is a pleasant 80 y.o. female who presented for follow up visit. She was last seen by Dr. Juliann Mule on 07/19/2013. Today, she is accompanied by her daughter.    Her daughter states that 08/06/2013 [place] her mother to the emergency room for rectal bleeding. At the time he got to the emergency room at [place]. She used Anusol suppositories from her previous admission in January 2015 with good results. She's had no further episodes of bleeding. She does request a refill for her Norco that she takes her pain management. She's tolerating her chemotherapy relatively well. She voiced no other specific complaints today other than her continued back pain.  She denies  focal neurological symptoms, headaches or visual changes, abdominal or urinary symptomatology. Her appetite has improved and weight is up by 1 pound.  She reported pain in back,neck and shoulders; numbness in fingers and feet, and dry skin. The patient denied fever, chills, night sweats, change in appetite or weight. She denied odynophagia or dysphagia. No chest pain, palpitations, abdominal pain, vomiting,  constipation, hematochezia. The patient denied dysuria, nocturia, polyuria, hematuria, myalgia, tingling, psychiatric problems.   MEDICAL HISTORY: Past Medical History  Diagnosis Date  . Hypertension   . Diabetes mellitus   . Anemia   . Osteoporosis   . Dyslipidemia   . Myocardial infarction   . colon ca dx'd 2003    surg only  . Multiple myeloma dx'd 2009    oral chemo ongoing  . Coronary artery disease   . Shortness of breath   . GERD (gastroesophageal reflux disease)   . Wears glasses     MEDIACL HISTORY: has Multiple myeloma; Anemia of chronic disease; Magnesium deficiency; Diabetes mellitus; Hypertension; Coronary artery disease with history of MI; Fall; Fracture of multiple ribs; PNA (pneumonia); Thrombocytopenia, unspecified; Hyponatremia; Moderate protein-calorie malnutrition; Hypomagnesemia; Hyperlipidemia; Hypokalemia; GI bleed; Acute GI bleeding; Chronic combined systolic and diastolic congestive heart failure; Abdominal pain, epigastric ventral hernia; Hiatal hernia; and History of colon cancer on her problem list.    ALLERGIES:  is allergic to gemfibrozil; nifedipine; and questran.  MEDICATIONS: has a current medication list which includes the following prescription(s): acetaminophen, acyclovir, carvedilol, vitamin d-3, cyanocobalamin, hydrocodone-acetaminophen, hydrocortisone, lisinopril, loperamide, metformin, multivitamin with minerals, pantoprazole, polyethyl glycol-propyl glycol, prochlorperazine, spironolactone, calcium carbonate, cyclobenzaprine, and magnesium oxide, and the following Facility-Administered Medications: cyclophosphamide (CYTOXAN) 400 mg in sodium chloride 0.9 % 250 mL chemo infusion, darbepoetin, and magnesium sulfate 2 g in sodium chloride 0.9 % 250 mL.  SURGICAL HISTORY:  Past Surgical History  Procedure Laterality Date  . Coronary artery bypass graft  08/2001    4 vessel  . Colon surgery  2003  .  Tonsillectomy    . Appendectomy    . Abdominal  hysterectomy    . Back surgery    . Eye surgery      cataracts  . Direct laryngoscopy with radiaesse injection Left 08/13/2012    Procedure: LEFT VOCAL CORD RADIESSE AUGMENTATION LARYNGOSCOPY ;  Surgeon: Jodi Marble, MD;  Location: [facility];  Service: ENT;  Laterality: Left;  . Colonoscopy N/A 06/13/2013    Procedure: COLONOSCOPY;  Surgeon: Jeryl Columbia, MD;  Location: [facility];  Service: Endoscopy;  Laterality: N/A;   Problem List:  1. Multiple myeloma, initially presenting as an IgA kappa monoclonal gammopathy in February 2009. 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 This was felt to be an adverse prognostic determinant. Bone marrow on 08/31/2007 showed 46% plasma cells. A repeat bone marrow on 08/05/2009 showed 73% plasma cells. Treatment with Revlimid was started in April 2011. Zometa was started in October 2011 and 2 years of treatment were concluded on 02/23/2012. We had previously started Aranesp for the patient's anemia in May of 2010. Most recent metastatic bone survey was on 08/05/2009. Other x-rays since then are available. Maximum IgA level was 2080 on 08/11/2009. Subcutaneous Velcade was added to the patient's treatment program on 06/14/2011 because of a rising IgA level 1080 on 06/02/2011. The patient had been receiving Velcade every 2 weeks in combination with Revlimid during the early months of 2014. The patient continues to demonstrate progressive disease as evidenced by rising IgA and serum kappa levels. Revlimid was discontinued in April 2014. The patient had received Revlimid for about 3 years from April 2011 through April 2014. As of 08/29/2012, the patient is now receiving Velcade, Cytoxan and Decadron every 2 weeks. The patient has also been receiving Aranesp 300 mcg subcu every 2 weeks for hemoglobin less than or equal to 10.  2. Anemia secondary to vitamin B12 deficiency. The patient had been on oral vitamin B12. However, her vitamin B12 level fell to  284 on 07/26/2011 and vitamin B12 shots 1000 mcg given IM every month was resumed on 09/06/2011.  3. Anemia secondary to iron deficiency.   REVIEW OF SYSTEMS:   Constitutional: Denies fevers, chills or abnormal weight loss Eyes: Denies blurriness of vision Ears, nose, mouth, throat, and face: Denies mucositis or sore throat Respiratory: Denies cough, dyspnea or wheezes Cardiovascular: Denies palpitation, chest discomfort or lower extremity swelling Gastrointestinal:  Denies nausea, heartburn;  positive for constipation Skin: Denies abnormal skin rashes Lymphatics: Denies new lymphadenopathy or easy bruising Neurological:Denies numbness, tingling or new weaknesses Behavioral/Psych: Mood is stable, no new changes  All other systems were reviewed with the patient and are negative.  PHYSICAL EXAMINATION: ECOG PERFORMANCE STATUS: 1 - Symptomatic but completely ambulatory  Blood pressure 102/67, pulse 73, temperature 98 F (36.7 C), temperature source Oral, resp. rate 17, height $RemoveBe'4\' 8"'nPOJurSlR$  (1.422 m), weight 100 lb 1.6 oz (45.405 kg), SpO2 93.00%.  GENERAL:alert, no distress and comfortable; Diminished hearing; Kyphotic SKIN: skin color, texture, turgor are normal, no rashes or significant lesions  EYES: normal, Conjunctiva are pink and non-injected, sclera clear OROPHARYNX:no exudate, no erythema and lips, buccal mucosa, and tongue normal  NECK: supple, thyroid normal size, non-tender, without nodularity LYMPH:  no palpable lymphadenopathy in the cervical, axillary or supraclavicular LUNGS: clear to auscultation and percussion with normal breathing effort HEART: regular rate & rhythm and no murmurs and no lower extremity edema ABDOMEN:abdomen soft, non-tender and normal bowel sounds Musculoskeletal:no cyanosis of digits and no clubbing. Marked kyphosis  NEURO: alert & oriented x 3 with fluent speech, no focal motor/sensory deficits; cautious gait with a walker.   Lab Results  Component Value  Date   WBC 3.8* 08/14/2013   HGB 10.3* 08/14/2013   HCT 31.0* 08/14/2013   MCV 98.8 08/14/2013   PLT 179 08/14/2013   NEUTROABS 2.8 08/14/2013      Chemistry      Component Value Date/Time   NA 140 08/14/2013 0832   NA 135* 08/06/2013 2130   K 4.2 08/14/2013 0832   K 4.3 08/06/2013 2130   CL 97 08/06/2013 2130   CL 101 10/18/2012 1051   CO2 26 08/14/2013 0832   CO2 23 08/06/2013 2130   BUN 7.4 08/14/2013 0832   BUN 17 08/06/2013 2130   CREATININE 0.7 08/14/2013 0832   CREATININE 0.66 08/06/2013 2130   CREATININE 0.69 08/11/2009 1145      Component Value Date/Time   CALCIUM 9.5 08/14/2013 0832   CALCIUM 9.4 08/06/2013 2130   ALKPHOS 54 08/06/2013 2130   ALKPHOS 54 06/26/2013 1035   AST 17 08/06/2013 2130   AST 16 06/26/2013 1035   ALT 8 08/06/2013 2130   ALT 8 06/26/2013 1035   BILITOT 0.2* 08/06/2013 2130   BILITOT 0.37 06/26/2013 1035     RADIOGRAPHIC STUDIES: METASTATIC BONE SURVEY 11/01/2012 Comparison: 08/05/2009 Correlation: Chest and abdominal radiographs 09/05/2012, CT chest 07/25/2012 Findings: Bones appear demineralized. Extensive scattered atherosclerotic calcifications. Post CABG. Severe multilevel degenerative disc and facet disease changes of the cervicothoracic and lumbar spine. Prior vertebroplasties at T11, T12, L1. Endplate compression deformities at T6, T7, and T10 unchanged. Inferior endplate compression deformity L3 new since radiographs 2011. No definite new thoracic compression fracture. Bowel gas projects over the right inferior pubic ramus and obturator foramen question obturator hernia. Bilateral  acromioclavicular and glenohumeral joint degenerative changes with bilateral chronic rotator cuff tears. Degenerative changes bilateral knees. Hiatal hernia. Callus identified at a probable healing fracture of a lateral lower left rib, new. IMPRESSION: Osseous demineralization with multiple thoracolumbar compression fractures and prior vertebroplasties. Inferior endplate compression deformity L3 new  since [age] though of uncertain age. No definite destructive lytic lesion identified to suggest new multiple myelomatous site. Scattered degenerative changes as above. Small hiatal hernia. Extensive atherosclerotic disease. Question right obturator hernia.   ASSESSMENT: Christina Hopkins 80 y.o. female with a history of Multiple myeloma - Plan: CBC with Differential, Basic metabolic panel (Bmet) - [facility], CBC with Differential, Comprehensive metabolic panel (Cmet) - [facility], Lactate dehydrogenase (LDH) - [facility]  Anemia of chronic disease   PLAN:  1. Multiple myeloma. --Recent emergency room visit for rectal bleeding likely related to her history of hemorrhoids. This is now resolved with no further episodes. She'll proceed with her chemotherapy today as scheduled. I will arrange for labs prior to her next scheduled treatment in 2 weeks and a followup appointment with Dr. Juliann Mule in [redacted] weeks along with labs and chemotherapy. Her IgA level and serum light chains have been stable recently on current regiment overall. Her last IgA was 536 down from 632 on 12/17. Protein studies are pending from today. --We will  proceed with her chemotherapy with Velcade subcutaneous, Cytoxan 400 mg, IV Decadron 20 mg IV (Day #1, cycle #48) in 2 weeks on 08/28/2013.   2. S/p recent GIB. --Her hemoglobin is stable.   3. Multiple rib fractures (L 7th, 8th, 9th) -- Continue Hydrocodone-acetaminophen 10-325 q 4 hours as need.  Refill prescription given today a total of 30 tablets  with no refill  4. Follow up. RTC in 4 weeks for evaluation for another treatment with CBC, CMET.She will have her next treatment in 2 weeks with labs prior to this visit.   5. Low magnesium level. Patient again will receive IV magnesium with her treatment today. I've asked her to increase her oral magnesium oxide 400 mg, 1 tablet twice daily.  All questions were answered. The patient knows to call the clinic with any problems, questions or concerns. We can  certainly see the patient much sooner if necessary.  I spent 20 minutes counseling the patient face to face. The total time spent in the appointment was 30 minutes.    Carlton Adam, PA-C 08/14/2013 11:22 AM

## 2013-08-14 NOTE — Patient Instructions (Signed)
[facility] Discharge Instructions for Patients Receiving Chemotherapy  Today you received the following chemotherapy agents: Velcade and Cytoxan.  To help prevent nausea and vomiting after your treatment, we encourage you to take your nausea medication.    If you develop nausea and vomiting that is not controlled by your nausea medication, call the clinic.   BELOW ARE SYMPTOMS THAT SHOULD BE REPORTED IMMEDIATELY:  *FEVER GREATER THAN 100.5 F  *CHILLS WITH OR WITHOUT FEVER  NAUSEA AND VOMITING THAT IS NOT CONTROLLED WITH YOUR NAUSEA MEDICATION  *UNUSUAL SHORTNESS OF BREATH  *UNUSUAL BRUISING OR BLEEDING  TENDERNESS IN MOUTH AND THROAT WITH OR WITHOUT PRESENCE OF ULCERS  *URINARY PROBLEMS  *BOWEL PROBLEMS  UNUSUAL RASH Items with * indicate a potential emergency and should be followed up as soon as possible.  Feel free to call the clinic you have any questions or concerns. The clinic phone number is [phone].  \[id number] Magnesium is a common ion (mineral) in the body which is needed for metabolism. It is about how the body handles food and other chemical reactions necessary for life. Only about 2% of the magnesium in our body is found in the blood. When this is low, it is called hypomagnesemia. The blood will measure only a tiny amount of the magnesium in our body. When it is low in our blood, it does not mean that the whole body supply is low. The normal serum concentration ranges from 1.8-2.5 mEq/L. When the level gets to be less than 1.0 mEq/L, a number of problems begin to happen.  CAUSES   Receiving intravenous fluids without magnesium replacement.  Loss of magnesium from the bowel by naso-gastric suction.  Loss of magnesium from nausea and vomiting or severe diarrhea. Any of the inflammatory bowel conditions can cause this.  Abuse of alcohol often leads to low serum magnesium.  An inherited form of magnesium loss happens when the  kidneys lose magnesium. This is called familial or primary hypomagnesemia.  Some medications such as diuretics also cause the loss of magnesium. SYMPTOMS  These following problems are worse if the changes in magnesium levels come on suddenly.  Tremor.  Confusion.  Muscle weakness.  Over-sensitive to sights and sounds.  Sensitive reflexes.  Depression.  Muscular fibrillations.  Over-reactivity of the nerves.  Irritability.  Psychosis.  Spasms of the hand muscles.  Tetany (where the muscles go into uncontrollable spasms). DIAGNOSIS  This condition can be diagnosed by blood tests. TREATMENT   In emergency, magnesium can be given intravenously (by vein).  If the condition is less worrisome, it can be corrected by diet. High levels of magnesium are found in green leafy vegetables, peas, beans and nuts among other things. It can also be given through medications by mouth.  If it is being caused by medications, changes can be made.  If alcohol is a problem, help is available if there are difficulties giving it up. Document Released: 01/26/2005 Document Revised: 07/25/2011 Document Reviewed: 12/21/2007 [facility] Patient Information 2014 [place].

## 2013-08-14 NOTE — Telephone Encounter (Signed)
gv adn printed aptp sched and avs for pt fro April...sed added tx.

## 2013-08-14 NOTE — Patient Instructions (Signed)
Return in 2 weeks for your next scheduled cycle of chemotherapy Followup with Dr. Juliann Mule in 4 weeks again with labs and chemotherapy

## 2013-08-15 LAB — IGG, IGA, IGM
IGG (IMMUNOGLOBIN G), SERUM: 383 mg/dL — AB (ref 690–1700)
IgA: 1060 mg/dL — ABNORMAL HIGH (ref 69–380)

## 2013-08-15 LAB — KAPPA/LAMBDA LIGHT CHAINS
Kappa free light chain: 41.6 mg/dL — ABNORMAL HIGH (ref 0.33–1.94)
Kappa:Lambda Ratio: 29.3 — ABNORMAL HIGH (ref 0.26–1.65)
Lambda Free Lght Chn: 1.42 mg/dL (ref 0.57–2.63)

## 2013-08-26 ENCOUNTER — Telehealth: Payer: Self-pay | Admitting: Medical Oncology

## 2013-08-26 NOTE — Telephone Encounter (Signed)
Christina Hopkins called and states that her mother has been sick for about two weeks with a bronchitis. She is scheduled for labs and chemo tomorrow but she is too weak to come. They would like to cancel but keep her appointments for labs,MD and chemo on 09/11/13. Dr. Juliann Mule notified.

## 2013-08-28 ENCOUNTER — Other Ambulatory Visit: Payer: Medicare Other

## 2013-08-28 ENCOUNTER — Ambulatory Visit: Payer: Medicare Other

## 2013-09-11 ENCOUNTER — Telehealth: Payer: Self-pay | Admitting: Internal Medicine

## 2013-09-11 ENCOUNTER — Other Ambulatory Visit: Payer: Self-pay | Admitting: Physician Assistant

## 2013-09-11 ENCOUNTER — Ambulatory Visit (HOSPITAL_BASED_OUTPATIENT_CLINIC_OR_DEPARTMENT_OTHER): Payer: Medicare Other | Admitting: Internal Medicine

## 2013-09-11 ENCOUNTER — Other Ambulatory Visit (HOSPITAL_BASED_OUTPATIENT_CLINIC_OR_DEPARTMENT_OTHER): Payer: Medicare Other

## 2013-09-11 ENCOUNTER — Telehealth: Payer: Self-pay | Admitting: *Deleted

## 2013-09-11 ENCOUNTER — Other Ambulatory Visit: Payer: Self-pay | Admitting: Internal Medicine

## 2013-09-11 ENCOUNTER — Other Ambulatory Visit: Payer: Self-pay

## 2013-09-11 ENCOUNTER — Ambulatory Visit (HOSPITAL_BASED_OUTPATIENT_CLINIC_OR_DEPARTMENT_OTHER): Payer: Medicare Other

## 2013-09-11 VITALS — BP 92/61 | HR 73 | Temp 97.9°F | Resp 17 | Wt 98.3 lb

## 2013-09-11 DIAGNOSIS — R790 Abnormal level of blood mineral: Secondary | ICD-10-CM

## 2013-09-11 DIAGNOSIS — Z5112 Encounter for antineoplastic immunotherapy: Secondary | ICD-10-CM

## 2013-09-11 DIAGNOSIS — Z5111 Encounter for antineoplastic chemotherapy: Secondary | ICD-10-CM

## 2013-09-11 DIAGNOSIS — C9000 Multiple myeloma not having achieved remission: Secondary | ICD-10-CM

## 2013-09-11 DIAGNOSIS — D518 Other vitamin B12 deficiency anemias: Secondary | ICD-10-CM

## 2013-09-11 DIAGNOSIS — D519 Vitamin B12 deficiency anemia, unspecified: Secondary | ICD-10-CM

## 2013-09-11 LAB — CBC WITH DIFFERENTIAL/PLATELET
BASO%: 0.5 % (ref 0.0–2.0)
BASOS ABS: 0 10*3/uL (ref 0.0–0.1)
EOS ABS: 0.1 10*3/uL (ref 0.0–0.5)
EOS%: 1 % (ref 0.0–7.0)
HEMATOCRIT: 31.3 % — AB (ref 34.8–46.6)
HEMOGLOBIN: 10.5 g/dL — AB (ref 11.6–15.9)
LYMPH%: 10.5 % — ABNORMAL LOW (ref 14.0–49.7)
MCH: 33.6 pg (ref 25.1–34.0)
MCHC: 33.5 g/dL (ref 31.5–36.0)
MCV: 100.5 fL (ref 79.5–101.0)
MONO#: 0.5 10*3/uL (ref 0.1–0.9)
MONO%: 8.9 % (ref 0.0–14.0)
NEUT#: 4.2 10*3/uL (ref 1.5–6.5)
NEUT%: 79.1 % — AB (ref 38.4–76.8)
Platelets: 136 10*3/uL — ABNORMAL LOW (ref 145–400)
RBC: 3.12 10*6/uL — ABNORMAL LOW (ref 3.70–5.45)
RDW: 16.9 % — ABNORMAL HIGH (ref 11.2–14.5)
WBC: 5.3 10*3/uL (ref 3.9–10.3)
lymph#: 0.6 10*3/uL — ABNORMAL LOW (ref 0.9–3.3)

## 2013-09-11 LAB — LACTATE DEHYDROGENASE (CC13): LDH: 134 U/L (ref 125–245)

## 2013-09-11 LAB — COMPREHENSIVE METABOLIC PANEL (CC13)
ALBUMIN: 3.3 g/dL — AB (ref 3.5–5.0)
ALK PHOS: 50 U/L (ref 40–150)
ALT: <6 U/L (ref 0–55)
AST: 15 U/L (ref 5–34)
Anion Gap: 9 mEq/L (ref 3–11)
BUN: 10.8 mg/dL (ref 7.0–26.0)
CALCIUM: 9.9 mg/dL (ref 8.4–10.4)
CO2: 25 mEq/L (ref 22–29)
CREATININE: 0.8 mg/dL (ref 0.6–1.1)
Chloride: 99 mEq/L (ref 98–109)
GLUCOSE: 169 mg/dL — AB (ref 70–140)
POTASSIUM: 4.4 meq/L (ref 3.5–5.1)
Sodium: 134 mEq/L — ABNORMAL LOW (ref 136–145)
Total Bilirubin: 0.36 mg/dL (ref 0.20–1.20)
Total Protein: 7.3 g/dL (ref 6.4–8.3)

## 2013-09-11 LAB — MAGNESIUM (CC13): MAGNESIUM: 1.3 mg/dL — AB (ref 1.5–2.5)

## 2013-09-11 MED ORDER — ONDANSETRON 8 MG/NS 50 ML IVPB
INTRAVENOUS | Status: AC
  Filled 2013-09-11: qty 8

## 2013-09-11 MED ORDER — SODIUM CHLORIDE 0.9 % IV SOLN
1.0000 g | Freq: Once | INTRAVENOUS | Status: AC
  Administered 2013-09-11: 1 g via INTRAVENOUS
  Filled 2013-09-11: qty 2

## 2013-09-11 MED ORDER — DEXAMETHASONE SODIUM PHOSPHATE 20 MG/5ML IJ SOLN
INTRAMUSCULAR | Status: AC
Start: 2013-09-11 — End: 2013-09-11
  Filled 2013-09-11: qty 5

## 2013-09-11 MED ORDER — HYDROCODONE-ACETAMINOPHEN 10-325 MG PO TABS: 1.0000 | ORAL_TABLET | ORAL | Status: DC | PRN

## 2013-09-11 MED ORDER — CYANOCOBALAMIN 1000 MCG/ML IJ SOLN
INTRAMUSCULAR | Status: AC
  Filled 2013-09-11: qty 1

## 2013-09-11 MED ORDER — DEXAMETHASONE SODIUM PHOSPHATE 20 MG/5ML IJ SOLN
20.0000 mg | Freq: Once | INTRAMUSCULAR | Status: AC
  Administered 2013-09-11: 20 mg via INTRAVENOUS

## 2013-09-11 MED ORDER — ONDANSETRON 8 MG/50ML IVPB (CHCC)
8.0000 mg | Freq: Once | INTRAVENOUS | Status: AC
  Administered 2013-09-11: 8 mg via INTRAVENOUS

## 2013-09-11 MED ORDER — CYANOCOBALAMIN 1000 MCG/ML IJ SOLN
1000.0000 ug | Freq: Once | INTRAMUSCULAR | Status: AC
  Administered 2013-09-11: 1000 ug via INTRAMUSCULAR

## 2013-09-11 MED ORDER — SODIUM CHLORIDE 0.9 % IV SOLN
400.0000 mg | Freq: Once | INTRAVENOUS | Status: AC
  Administered 2013-09-11: 400 mg via INTRAVENOUS
  Filled 2013-09-11: qty 20

## 2013-09-11 MED ORDER — BORTEZOMIB CHEMO SQ INJECTION 3.5 MG (2.5MG/ML)
1.7500 mg | Freq: Once | INTRAMUSCULAR | Status: AC
  Administered 2013-09-11: 1.75 mg via SUBCUTANEOUS
  Filled 2013-09-11: qty 1.75

## 2013-09-11 MED ORDER — SODIUM CHLORIDE 0.9 % IV SOLN
Freq: Once | INTRAVENOUS | Status: AC
  Administered 2013-09-11: 11:00:00 via INTRAVENOUS

## 2013-09-11 NOTE — Progress Notes (Signed)
1250-Prior to hanging Cytoxan, IV site to left ac infiltrated.  No pain to site.  Heat pack applied to site.    1415-At time of discharge, infiltration site to left ac with no swelling noted.  Pt denies any pain or discomfort to arm.

## 2013-09-11 NOTE — Telephone Encounter (Signed)
Per staff message and POF I have scheduled appts.  [name]  

## 2013-09-11 NOTE — Patient Instructions (Signed)
[facility] Discharge Instructions for Patients Receiving Chemotherapy  Today you received the following chemotherapy agents:  Cytoxan and Velcade  To help prevent nausea and vomiting after your treatment, we encourage you to take your nausea medication as ordered per MD.   If you develop nausea and vomiting that is not controlled by your nausea medication, call the clinic.   BELOW ARE SYMPTOMS THAT SHOULD BE REPORTED IMMEDIATELY:  *FEVER GREATER THAN 100.5 F  *CHILLS WITH OR WITHOUT FEVER  NAUSEA AND VOMITING THAT IS NOT CONTROLLED WITH YOUR NAUSEA MEDICATION  *UNUSUAL SHORTNESS OF BREATH  *UNUSUAL BRUISING OR BLEEDING  TENDERNESS IN MOUTH AND THROAT WITH OR WITHOUT PRESENCE OF ULCERS  *URINARY PROBLEMS  *BOWEL PROBLEMS  UNUSUAL RASH Items with * indicate a potential emergency and should be followed up as soon as possible.  Feel free to call the clinic you have any questions or concerns. The clinic phone number is [phone].

## 2013-09-11 NOTE — Progress Notes (Signed)
[place], MD [address] [place] [address]  DIAGNOSIS: Multiple myeloma  Chief Complaint  Patient presents with  . Follow-up   DIAGNOSIS:  1. Multiple myeloma.  2. Anemia secondary to vitamin B12 deficiency.   PAST THERAPY:  Revlimid (08/2009-08/2012)  Zometa (02/2010 - 02/2012)  Velcade (05/2011 -present)  Cytoxan 08/2012 - present)  Decadron (08/2012 - present)   CURRENT THERAPY:  1. Velcade, Cytoxan and Decadron every 2 weeks from 08/29/2012.  2. Vitamin B12 mg IM monthly.  3. Aranesp 300 mcg subcu every 2 weeks for hemoglobin less than or equal to 10.   Interim History: The patient is a pleasant 80 y.o. female who presented for follow up visit. She was last seen by Awilda Metro, PA-C on 08/14/2013. Today, she is accompanied by her daughter.    She was recently discharged on 06/14/2013 following a ten-day hospitalization due to acute GIB thought to be secondary to diverticulosis and hemorrhoids.  She received received a total of 4 units of blood.  She has been deconditioned since this admission and has required physical therapy.   Prior to that, she was recently discharged on 03/28/2013 following a 5-day admission secondary to mechanical fall she had at home resulting in left sided rib fractures (7th, 8th and 9th ribs) and findings concerning for PNA.  She was discharged to the [facility] and completed physical rehabilitation on 04/19/2013.    Today,  she reports having bronchitis.  She took the Z-pack by Dr. Joneen Caraway of [facility].  She completed on April 10th.  She reports improvement in cough.   She denies any hospitalizations or fevers or chills.  She denies  focal neurological symptoms, headaches or visual changes, abdominal or urinary. Her appetite and weight is stable. Patient had double and blurry vision and had successful cataract surgery this past September.  She still  has mild  diarrhea for which he takes Imodium. She reported pain in back,neck and shoulders; numbness in fingers and feet, and dry skin. The patient denied fever, chills, night sweats, change in appetite or weight. She denied odynophagia or dysphagia. No chest pain, palpitations, abdominal pain, vomiting, constipation, hematochezia. The patient denied dysuria, nocturia, polyuria, hematuria, myalgia, tingling, psychiatric problems.   MEDICAL HISTORY: Past Medical History  Diagnosis Date  . Hypertension   . Diabetes mellitus   . Anemia   . Osteoporosis   . Dyslipidemia   . Myocardial infarction   . colon ca dx'd 2003    surg only  . Multiple myeloma dx'd 2009    oral chemo ongoing  . Coronary artery disease   . Shortness of breath   . GERD (gastroesophageal reflux disease)   . Wears glasses     INTERIM HISTORY: has Multiple myeloma; Anemia of chronic disease; Magnesium deficiency; Diabetes mellitus; Hypertension; Coronary artery disease with history of MI; Fall; Fracture of multiple ribs; PNA (pneumonia); Thrombocytopenia, unspecified; Hyponatremia; Moderate protein-calorie malnutrition; Hypomagnesemia; Hyperlipidemia; Hypokalemia; GI bleed; Acute GI bleeding; Chronic combined systolic and diastolic congestive heart failure; Abdominal pain, epigastric ventral hernia; Hiatal hernia; and History of colon cancer on her problem list.    ALLERGIES:  is allergic to gemfibrozil; nifedipine; and questran.  MEDICATIONS: has a current medication list which includes the following prescription(s): acetaminophen, acyclovir, calcium carbonate, vitamin d-3, cyanocobalamin, cyclobenzaprine, hydrocortisone, lisinopril, loperamide, magnesium oxide, metformin, multivitamin with minerals, pantoprazole, polyethyl glycol-propyl glycol, prochlorperazine, spironolactone, carvedilol, and hydrocodone-acetaminophen, and the following Facility-Administered  Medications: darbepoetin.  SURGICAL HISTORY:  Past Surgical History   Procedure Laterality Date  . Coronary artery bypass graft  08/2001    4 vessel  . Colon surgery  2003  . Tonsillectomy    . Appendectomy    . Abdominal hysterectomy    . Back surgery    . Eye surgery      cataracts  . Direct laryngoscopy with radiaesse injection Left 08/13/2012    Procedure: LEFT VOCAL CORD RADIESSE AUGMENTATION LARYNGOSCOPY ;  Surgeon: Jodi Marble, MD;  Location: [facility];  Service: ENT;  Laterality: Left;  . Colonoscopy N/A 06/13/2013    Procedure: COLONOSCOPY;  Surgeon: Jeryl Columbia, MD;  Location: [facility];  Service: Endoscopy;  Laterality: N/A;   Problem List:  1. Multiple myeloma, initially presenting as an IgA kappa monoclonal gammopathy in February 2009. 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 This was felt to be an adverse prognostic determinant. Bone marrow on 08/31/2007 showed 46% plasma cells. A repeat bone marrow on 08/05/2009 showed 73% plasma cells. Treatment with Revlimid was started in April 2011. Zometa was started in October 2011 and 2 years of treatment were concluded on 02/23/2012. We had previously started Aranesp for the patient's anemia in May of 2010. Most recent metastatic bone survey was on 08/05/2009. Other x-rays since then are available. Maximum IgA level was 2080 on 08/11/2009. Subcutaneous Velcade was added to the patient's treatment program on 06/14/2011 because of a rising IgA level 1080 on 06/02/2011. The patient had been receiving Velcade every 2 weeks in combination with Revlimid during the early months of 2014. The patient continues to demonstrate progressive disease as evidenced by rising IgA and serum kappa levels. Revlimid was discontinued in April 2014. The patient had received Revlimid for about 3 years from April 2011 through April 2014. As of 08/29/2012, the patient is now receiving Velcade, Cytoxan and Decadron every 2 weeks. The patient has also been receiving Aranesp 300 mcg subcu every 2 weeks for  hemoglobin less than or equal to 10.  2. Anemia secondary to vitamin B12 deficiency. The patient had been on oral vitamin B12. However, her vitamin B12 level fell to 284 on 07/26/2011 and vitamin B12 shots 1000 mcg given IM every month was resumed on 09/06/2011.  3. Anemia secondary to iron deficiency.   REVIEW OF SYSTEMS:   Constitutional: Denies fevers, chills or abnormal weight loss Eyes: Denies blurriness of vision Ears, nose, mouth, throat, and face: Denies mucositis or sore throat Respiratory: Denies cough, dyspnea or wheezes Cardiovascular: Denies palpitation, chest discomfort or lower extremity swelling Gastrointestinal:  Denies nausea, heartburn;  positive for constipation Skin: Denies abnormal skin rashes Lymphatics: Denies new lymphadenopathy or easy bruising Neurological:Denies numbness, tingling or new weaknesses Behavioral/Psych: Mood is stable, no new changes  All other systems were reviewed with the patient and are negative.  PHYSICAL EXAMINATION: ECOG PERFORMANCE STATUS: 1 - Symptomatic but completely ambulatory  Blood pressure 92/61, pulse 73, temperature 97.9 F (36.6 C), temperature source Oral, resp. rate 17, weight 98 lb 4.8 oz (44.589 kg), SpO2 95.00%.  GENERAL:alert, no distress and comfortable; Diminished hearing; Kyphotic SKIN: skin color, texture, turgor are normal, no rashes or significant lesions  EYES: normal, Conjunctiva are pink and non-injected, sclera clear OROPHARYNX:no exudate, no erythema and lips, buccal mucosa, and tongue normal  NECK: supple, thyroid normal size, non-tender, without nodularity LYMPH:  no palpable lymphadenopathy in the cervical, axillary or supraclavicular LUNGS: clear to auscultation and percussion with normal breathing effort  HEART: regular rate & rhythm and no murmurs and no lower extremity edema ABDOMEN:abdomen soft, non-tender and normal bowel sounds Musculoskeletal:no cyanosis of digits and no clubbing  NEURO: alert &  oriented x 3 with fluent speech, no focal motor/sensory deficits; cautious gait with a walker.   Lab Results  Component Value Date   WBC 5.3 09/11/2013   HGB 10.5* 09/11/2013   HCT 31.3* 09/11/2013   MCV 100.5 09/11/2013   PLT 136* 09/11/2013   NEUTROABS 4.2 09/11/2013      Chemistry      Component Value Date/Time   NA 134* 09/11/2013 0840   NA 135* 08/06/2013 2130   K 4.4 09/11/2013 0840   K 4.3 08/06/2013 2130   CL 97 08/06/2013 2130   CL 101 10/18/2012 1051   CO2 25 09/11/2013 0840   CO2 23 08/06/2013 2130   BUN 10.8 09/11/2013 0840   BUN 17 08/06/2013 2130   CREATININE 0.8 09/11/2013 0840   CREATININE 0.66 08/06/2013 2130   CREATININE 0.69 08/11/2009 1145      Component Value Date/Time   CALCIUM 9.9 09/11/2013 0840   CALCIUM 9.4 08/06/2013 2130   ALKPHOS 50 09/11/2013 0840   ALKPHOS 54 08/06/2013 2130   AST 15 09/11/2013 0840   AST 17 08/06/2013 2130   ALT <6 09/11/2013 0840   ALT 8 08/06/2013 2130   BILITOT 0.36 09/11/2013 0840   BILITOT 0.2* 08/06/2013 2130     Results for TANISHKA, DROLET (MRN [id number]) as of 09/11/2013 13:01  Ref. Range 05/01/2013 10:30 05/29/2013 10:43 06/26/2013 10:35 08/14/2013 08:31  IgA Latest Range: 69-380 mg/dL 632 (H) 536 (H) 751 (H) 1060 (H)  Results for CORTLYN, CANNELL (MRN [id number]) as of 09/11/2013 13:01  Ref. Range 05/01/2013 10:30 05/29/2013 10:43 06/26/2013 10:35 08/14/2013 08:31  Kappa:Lambda Ratio Latest Range: 0.26-1.65  7.25 (H) 8.46 (H) 16.63 (H) 29.30 (H)   Results for NYSA, SARIN (MRN [id number]) as of 09/11/2013 13:01  Ref. Range 05/01/2013 10:30 05/29/2013 10:43  M-SPIKE, % No range found 0.47 0.41   RADIOGRAPHIC STUDIES: METASTATIC BONE SURVEY 11/01/2012 Comparison: 08/05/2009 Correlation: Chest and abdominal radiographs 09/05/2012, CT chest 07/25/2012 Findings: Bones appear demineralized. Extensive scattered atherosclerotic calcifications. Post CABG. Severe multilevel degenerative disc and facet disease changes of the cervicothoracic and lumbar spine.  Prior vertebroplasties at T11, T12, L1. Endplate compression deformities at T6, T7, and T10 unchanged. Inferior endplate compression deformity L3 new since radiographs 2011. No definite new thoracic compression fracture. Bowel gas projects over the right inferior pubic ramus and obturator foramen question obturator hernia. Bilateral  acromioclavicular and glenohumeral joint degenerative changes with bilateral chronic rotator cuff tears. Degenerative changes bilateral knees. Hiatal hernia. Callus identified at a probable healing fracture of a lateral lower left rib, new. IMPRESSION: Osseous demineralization with multiple thoracolumbar compression fractures and prior vertebroplasties. Inferior endplate compression deformity L3 new since 6295 though of uncertain age. No definite destructive lytic lesion identified to suggest new multiple myelomatous site. Scattered degenerative changes as above. Small hiatal hernia. Extensive atherosclerotic disease. Question right obturator hernia.   ASSESSMENT: GAVRIELLA HEARST 80 y.o. female with a history of Multiple myeloma   PLAN:  1. Multiple myeloma. --She continues to do well and now has recovered from her bronchitis.   We will continue her chemotherapy today and continue every 2 weeks. Her IgA level and serum light chains have been stable recently on current regiment overall. Her last IgA was 536 down from 632 on 12/17.  --We will  proceed with her chemotherapy with Velcade subcutaneous, Cytoxan 400 mg, IV Decadron 20 mg IV (Day #1, cycle #46) in 2 weeks on 08/01/2013. She has been provided a handout explaining common side-effects on prior visits.  --If she progresses on current regiment, pomalyst or carfilzomib can be next considered.   2. S/p bronchitis. --She reports resolution in her symptoms.  She will continue to keep [place] updated with any new symptoms.  Her IgG levels are low.    3. Follow up. RTC in 4 weeks for evaluation for another treatment with CBC,  CMET, IgA level, and serum light chains. She will have her next treatment in 2 weeks with labs prior to this visit.   All questions were answered. The patient knows to call the clinic with any problems, questions or concerns. We can certainly see the patient much sooner if necessary.  I spent 15 minutes counseling the patient face to face. The total time spent in the appointment was 25 minutes.    Concha Norway, MD 09/11/2013 9:38 AM

## 2013-09-11 NOTE — Patient Instructions (Signed)
Pomalidomide oral capsules What is this medicine? POMALIDOMIDE (pom a LID oh mide) is a chemotherapy drug used to treat multiple myeloma. It targets specific proteins within cancer cells and stops the cancer cell from growing. This medicine may be used for other purposes; ask your health care provider or pharmacist if you have questions. COMMON BRAND NAME(S): POMALYST What should I tell my health care provider before I take this medicine? They need to know if you have any of these conditions: -history of blood clots -irregular monthly periods or menstrual cycles -an unusual or allergic reaction to pomalidomide, other medicines, foods, dyes, or preservatives -pregnant or trying to get pregnant -breast-feeding How should I use this medicine? Take this medicine by mouth with a glass of water. Follow the directions on the prescription label. Take this medicine on an empty stomach, at least 2 hours before or 2 hours after a meal. Do not take with food. Do not cut, crush, or chew this medicine. Take your medicine at regular intervals. Do not take it more often than directed. Do not stop taking except on your doctor's advice. A special MedGuide will be given to you by the pharmacist with each prescription and refill. Be sure to read this information carefully each time. Talk to your pediatrician regarding the use of this medicine in children. Special care may be needed. Overdosage: If you think you've taken too much of this medicine contact a poison control center or emergency room at once. Overdosage: If you think you have taken too much of this medicine contact a poison control center or emergency room at once. NOTE: This medicine is only for you. Do not share this medicine with others. What if I miss a dose? If you miss a dose, take it as soon as you can. If your next dose is to be taken in less than 12 hours, then do not take the missed dose. Take the next dose at your regular time. Do not take  double or extra doses. What may interact with this medicine? This medicine may interact with the following medications: -amprenavir -boceprevir -carbamazepine -dalfopristin; quinupristin -delavirdine -enzalutamide -fosamprenavir -indinavir -isoniazid, INH -itraconazole -ketoconazole -nicardipine -rifampin -ritonavir -St. John's Wort, Hypericum perforatum -telaprevir -telithromycin -thiabendazole -tipranavir -tobacco (cigarettes) This list may not describe all possible interactions. Give your health care provider a list of all the medicines, herbs, non-prescription drugs, or dietary supplements you use. Also tell them if you smoke, drink alcohol, or use illegal drugs. Some items may interact with your medicine. What should I watch for while using this medicine? Visit your doctor for regular check ups. Tell your doctor or healthcare professional if your symptoms do not start to get better or if they get worse. You will need to have important blood work done while you are taking this medicine. This medicine is available only through a special program. Doctors, pharmacies, and patients must meet all of the conditions of the program. Your health care provider will help you get signed up with the program if you need this medicine. Through the program you will only receive up to a 28 day supply of the medicine at one time. You will need a new prescription for each refill. This medicine can cause birth defects. Do not get pregnant while taking this drug. Females with child-bearing potential will need to have 2 negative pregnancy tests before starting this medicine. Pregnancy testing must be done every 2 to 4 weeks as directed while taking this medicine. Use 2 reliable forms of  weeks as directed while taking this medicine. Use 2 reliable forms of birth control together while you are taking this medicine and for 1 month after you stop taking this medicine. If you think that you might be pregnant talk to your doctor right away. Men must use a latex  condom during sexual contact with a woman while taking this medicine and for 28 days after you stop taking this medicine. A latex condom is needed even if you have had a vasectomy. Contact your doctor right away if your partner becomes pregnant. Do not donate sperm while taking this medicine and for 28 days after you stop taking this medicine. Do not give blood while taking the medicine and for 1 month after completion of treatment to avoid exposing pregnant women to the medicine through the donated blood. You may need blood work done while you are taking this medicine. If you are going to have surgery or any other procedures, tell your doctor you are taking this medicine.  What side effects may I notice from receiving this medicine?  Side effects that you should report to your doctor or health care professional as soon as possible: -allergic reactions like skin rash, itching or hives, swelling of the face, lips, or tongue -bloody or dark, tarry stools -breathing problems -chest pain -confusion -dark urine -fever, infection, runny nose, or sore throat -nausea, vomiting -pain, tingling, numbness in the hands or feet -red spots on the skin -swelling of your hands, ankles or leg -trouble passing urine or change in the amount of urine -unusual bleeding or bruising  Side effects that usually do not require medical attention (Report these to your doctor or health care professional if they continue or are bothersome.): -constipation -diarrhea -dizziness -headache -joint pain -muscle pain -tiredness -trouble sleeping This list may not describe all possible side effects. Call your doctor for medical advice about side effects. You may report side effects to FDA at 1-800-FDA-1088.  Where should I keep my medicine?  Keep out of the reach of children. Store between 20 and 25 degrees C (68 and 77 degrees F). Throw away any unused medicine after the expiration date. NOTE: This sheet is a  summary. It may not cover all possible information. If you have questions about this medicine, talk to your doctor, pharmacist, or health care provider.  2014, Elsevier/Gold Standard. (2011-08-18 05:58:16)  

## 2013-09-11 NOTE — Telephone Encounter (Signed)
gave pt appt for lab,md for May and June, emailed Sharyn Lull regarding chemo

## 2013-09-12 ENCOUNTER — Telehealth: Payer: Self-pay | Admitting: Medical Oncology

## 2013-09-12 ENCOUNTER — Telehealth: Payer: Self-pay | Admitting: Internal Medicine

## 2013-09-12 MED ORDER — HYDROCODONE-HOMATROPINE 5-1.5 MG/5ML PO SYRP: 5.0000 mL | ORAL_SOLUTION | Freq: Four times a day (QID) | ORAL | Status: DC | PRN

## 2013-09-12 NOTE — Telephone Encounter (Signed)
Called pt appt for Mat 2015 lab, md, chemo , mailed appt to pt

## 2013-09-12 NOTE — Telephone Encounter (Signed)
Christina Hopkins called stating her mother saw Dr. Juliann Mule yesterday. She had a cough but her cough has gotten worse. She would like to know if we can call her in something. I spoke with Dr. Juliann Mule and he is prescribing hycodan cough syrup. I called pt and Christina Hopkins to inform therm.

## 2013-09-23 ENCOUNTER — Telehealth: Payer: Self-pay

## 2013-09-23 ENCOUNTER — Ambulatory Visit
Admission: RE | Admit: 2013-09-23 | Discharge: 2013-09-23 | Disposition: A | Discharge status: Home / Self Care | Payer: Medicare Other | Source: Ambulatory Visit | Attending: Family Medicine | Admitting: Family Medicine

## 2013-09-23 ENCOUNTER — Other Ambulatory Visit: Payer: Self-pay | Admitting: Family Medicine

## 2013-09-23 DIAGNOSIS — J40 Bronchitis, not specified as acute or chronic: Secondary | ICD-10-CM

## 2013-09-23 NOTE — Telephone Encounter (Signed)
Christina Hopkins that Christina Hopkins saw Dr Kenton Kingfisher Friday for bronchitis. She is on oral levaquin. She is still DOE, has productive cough. She is slightly improved but will not be able to tolerate chemo on Wed. [name] will be calling Dr Kenton Kingfisher today to update him as well.  [name] is cancelling Wed appt. Pt's next scheduled appt is 5/27. Dr. Juliann Mule made aware.

## 2013-09-25 ENCOUNTER — Other Ambulatory Visit: Payer: Medicare Other

## 2013-09-25 ENCOUNTER — Ambulatory Visit: Payer: Medicare Other

## 2013-10-09 ENCOUNTER — Other Ambulatory Visit (HOSPITAL_BASED_OUTPATIENT_CLINIC_OR_DEPARTMENT_OTHER): Payer: Medicare Other

## 2013-10-09 ENCOUNTER — Ambulatory Visit (HOSPITAL_BASED_OUTPATIENT_CLINIC_OR_DEPARTMENT_OTHER): Payer: Medicare Other | Admitting: Internal Medicine

## 2013-10-09 ENCOUNTER — Telehealth: Payer: Self-pay | Admitting: Internal Medicine

## 2013-10-09 ENCOUNTER — Other Ambulatory Visit: Payer: Self-pay

## 2013-10-09 ENCOUNTER — Ambulatory Visit (HOSPITAL_BASED_OUTPATIENT_CLINIC_OR_DEPARTMENT_OTHER): Payer: Medicare Other

## 2013-10-09 VITALS — BP 96/44 | HR 75 | Temp 98.6°F | Resp 18 | Ht <= 58 in | Wt 96.7 lb

## 2013-10-09 DIAGNOSIS — E538 Deficiency of other specified B group vitamins: Secondary | ICD-10-CM

## 2013-10-09 DIAGNOSIS — C9000 Multiple myeloma not having achieved remission: Secondary | ICD-10-CM

## 2013-10-09 DIAGNOSIS — Z5111 Encounter for antineoplastic chemotherapy: Secondary | ICD-10-CM

## 2013-10-09 DIAGNOSIS — D518 Other vitamin B12 deficiency anemias: Secondary | ICD-10-CM

## 2013-10-09 DIAGNOSIS — E785 Hyperlipidemia, unspecified: Secondary | ICD-10-CM

## 2013-10-09 DIAGNOSIS — D509 Iron deficiency anemia, unspecified: Secondary | ICD-10-CM

## 2013-10-09 DIAGNOSIS — I2510 Atherosclerotic heart disease of native coronary artery without angina pectoris: Secondary | ICD-10-CM

## 2013-10-09 DIAGNOSIS — D519 Vitamin B12 deficiency anemia, unspecified: Secondary | ICD-10-CM

## 2013-10-09 DIAGNOSIS — D638 Anemia in other chronic diseases classified elsewhere: Secondary | ICD-10-CM

## 2013-10-09 DIAGNOSIS — I10 Essential (primary) hypertension: Secondary | ICD-10-CM

## 2013-10-09 DIAGNOSIS — Z5112 Encounter for antineoplastic immunotherapy: Secondary | ICD-10-CM

## 2013-10-09 DIAGNOSIS — E612 Magnesium deficiency: Secondary | ICD-10-CM

## 2013-10-09 LAB — CBC WITH DIFFERENTIAL/PLATELET
BASO%: 0.9 % (ref 0.0–2.0)
Basophils Absolute: 0 10*3/uL (ref 0.0–0.1)
EOS%: 1.4 % (ref 0.0–7.0)
Eosinophils Absolute: 0.1 10*3/uL (ref 0.0–0.5)
HEMATOCRIT: 28.3 % — AB (ref 34.8–46.6)
HGB: 9.4 g/dL — ABNORMAL LOW (ref 11.6–15.9)
LYMPH%: 17.6 % (ref 14.0–49.7)
MCH: 33.8 pg (ref 25.1–34.0)
MCHC: 33.3 g/dL (ref 31.5–36.0)
MCV: 101.5 fL — AB (ref 79.5–101.0)
MONO#: 0.4 10*3/uL (ref 0.1–0.9)
MONO%: 9.2 % (ref 0.0–14.0)
NEUT#: 2.7 10*3/uL (ref 1.5–6.5)
NEUT%: 70.9 % (ref 38.4–76.8)
Platelets: 156 10*3/uL (ref 145–400)
RBC: 2.79 10*6/uL — ABNORMAL LOW (ref 3.70–5.45)
RDW: 16.1 % — ABNORMAL HIGH (ref 11.2–14.5)
WBC: 3.8 10*3/uL — ABNORMAL LOW (ref 3.9–10.3)
lymph#: 0.7 10*3/uL — ABNORMAL LOW (ref 0.9–3.3)

## 2013-10-09 LAB — COMPREHENSIVE METABOLIC PANEL (CC13)
ALT: 8 U/L (ref 0–55)
ANION GAP: 12 meq/L — AB (ref 3–11)
AST: 15 U/L (ref 5–34)
Albumin: 3.1 g/dL — ABNORMAL LOW (ref 3.5–5.0)
Alkaline Phosphatase: 44 U/L (ref 40–150)
BILIRUBIN TOTAL: 0.3 mg/dL (ref 0.20–1.20)
BUN: 9.4 mg/dL (ref 7.0–26.0)
CO2: 26 meq/L (ref 22–29)
CREATININE: 0.7 mg/dL (ref 0.6–1.1)
Calcium: 9.9 mg/dL (ref 8.4–10.4)
Chloride: 99 mEq/L (ref 98–109)
Glucose: 140 mg/dl (ref 70–140)
Potassium: 3.9 mEq/L (ref 3.5–5.1)
Sodium: 137 mEq/L (ref 136–145)
Total Protein: 7.7 g/dL (ref 6.4–8.3)

## 2013-10-09 LAB — MAGNESIUM (CC13): MAGNESIUM: 1.4 mg/dL — AB (ref 1.5–2.5)

## 2013-10-09 MED ORDER — CYCLOBENZAPRINE HCL 10 MG PO TABS: 10.0000 mg | ORAL_TABLET | Freq: Three times a day (TID) | ORAL | Status: DC | PRN

## 2013-10-09 MED ORDER — SODIUM CHLORIDE 0.9 % IV SOLN
400.0000 mg | Freq: Once | INTRAVENOUS | Status: AC
  Administered 2013-10-09: 400 mg via INTRAVENOUS
  Filled 2013-10-09: qty 20

## 2013-10-09 MED ORDER — ONDANSETRON 8 MG/50ML IVPB (CHCC)
8.0000 mg | Freq: Once | INTRAVENOUS | Status: AC
  Administered 2013-10-09: 8 mg via INTRAVENOUS

## 2013-10-09 MED ORDER — CYANOCOBALAMIN 1000 MCG/ML IJ SOLN
INTRAMUSCULAR | Status: AC
  Filled 2013-10-09: qty 1

## 2013-10-09 MED ORDER — SODIUM CHLORIDE 0.9 % IV SOLN
Freq: Once | INTRAVENOUS | Status: AC
  Administered 2013-10-09: 10:00:00 via INTRAVENOUS

## 2013-10-09 MED ORDER — BORTEZOMIB CHEMO SQ INJECTION 3.5 MG (2.5MG/ML)
1.7500 mg | Freq: Once | INTRAMUSCULAR | Status: AC
  Administered 2013-10-09: 1.75 mg via SUBCUTANEOUS
  Filled 2013-10-09: qty 1.75

## 2013-10-09 MED ORDER — DEXAMETHASONE SODIUM PHOSPHATE 20 MG/5ML IJ SOLN
20.0000 mg | Freq: Once | INTRAMUSCULAR | Status: AC
  Administered 2013-10-09: 20 mg via INTRAVENOUS

## 2013-10-09 MED ORDER — DEXAMETHASONE SODIUM PHOSPHATE 20 MG/5ML IJ SOLN
INTRAMUSCULAR | Status: AC
  Filled 2013-10-09: qty 5

## 2013-10-09 MED ORDER — CYANOCOBALAMIN 1000 MCG/ML IJ SOLN
1000.0000 ug | Freq: Once | INTRAMUSCULAR | Status: AC
  Administered 2013-10-09: 1000 ug via INTRAMUSCULAR

## 2013-10-09 MED ORDER — HYDROCODONE-ACETAMINOPHEN 10-325 MG PO TABS: 1.0000 | ORAL_TABLET | ORAL | Status: DC | PRN

## 2013-10-09 MED ORDER — DARBEPOETIN ALFA-POLYSORBATE 500 MCG/ML IJ SOLN
300.0000 ug | Freq: Once | INTRAMUSCULAR | Status: AC
  Administered 2013-10-09: 300 ug via SUBCUTANEOUS
  Filled 2013-10-09: qty 1

## 2013-10-09 MED ORDER — ONDANSETRON 8 MG/NS 50 ML IVPB
INTRAVENOUS | Status: AC
  Filled 2013-10-09: qty 8

## 2013-10-09 NOTE — Telephone Encounter (Signed)
gv adn printed appts sched adn avs for pt for June....sed added tx.

## 2013-10-09 NOTE — Patient Instructions (Signed)
Hypomagnesemia Magnesium is a common ion (mineral) in the body which is needed for metabolism. It is about how the body handles food and other chemical reactions necessary for life. Only about 2% of the magnesium in our body is found in the blood. When this is low, it is called hypomagnesemia. The blood will measure only a tiny amount of the magnesium in our body. When it is low in our blood, it does not mean that the whole body supply is low. The normal serum concentration ranges from 1.8-2.5 mEq/L. When the level gets to be less than 1.0 mEq/L, a number of problems begin to happen.  CAUSES   Receiving intravenous fluids without magnesium replacement.  Loss of magnesium from the bowel by naso-gastric suction.  Loss of magnesium from nausea and vomiting or severe diarrhea. Any of the inflammatory bowel conditions can cause this.  Abuse of alcohol often leads to low serum magnesium.  An inherited form of magnesium loss happens when the kidneys lose magnesium. This is called familial or primary hypomagnesemia.  Some medications such as diuretics also cause the loss of magnesium. SYMPTOMS  These following problems are worse if the changes in magnesium levels come on suddenly.  Tremor.  Confusion.  Muscle weakness.  Over-sensitive to sights and sounds.  Sensitive reflexes.  Depression.  Muscular fibrillations.  Over-reactivity of the nerves.  Irritability.  Psychosis.  Spasms of the hand muscles.  Tetany (where the muscles go into uncontrollable spasms). DIAGNOSIS  This condition can be diagnosed by blood tests. TREATMENT   In emergency, magnesium can be given intravenously (by vein).  If the condition is less worrisome, it can be corrected by diet. High levels of magnesium are found in green leafy vegetables, peas, beans and nuts among other things. It can also be given through medications by mouth.  If it is being caused by medications, changes can be made.  If  alcohol is a problem, help is available if there are difficulties giving it up. Document Released: 01/26/2005 Document Revised: 07/25/2011 Document Reviewed: 12/21/2007 ExitCare Patient Information 2014 [employer].        

## 2013-10-09 NOTE — Patient Instructions (Signed)
[facility] Discharge Instructions for Patients Receiving Chemotherapy  Today you received the following chemotherapy agents Cytoxan/Velcade To help prevent nausea and vomiting after your treatment, we encourage you to take your nausea medication as prescribed.If you develop nausea and vomiting that is not controlled by your nausea medication, call the clinic.   BELOW ARE SYMPTOMS THAT SHOULD BE REPORTED IMMEDIATELY:  *FEVER GREATER THAN 100.5 F  *CHILLS WITH OR WITHOUT FEVER  NAUSEA AND VOMITING THAT IS NOT CONTROLLED WITH YOUR NAUSEA MEDICATION  *UNUSUAL SHORTNESS OF BREATH  *UNUSUAL BRUISING OR BLEEDING  TENDERNESS IN MOUTH AND THROAT WITH OR WITHOUT PRESENCE OF ULCERS  *URINARY PROBLEMS  *BOWEL PROBLEMS  UNUSUAL RASH Items with * indicate a potential emergency and should be followed up as soon as possible.  Feel free to call the clinic you have any questions or concerns. The clinic phone number is [phone].  Darbepoetin Alfa injection (Arasnesp) What is this medicine? DARBEPOETIN ALFA (dar be POE e tin AL fa) helps your body make more red blood cells. It is used to treat anemia caused by chronic kidney failure and chemotherapy. This medicine may be used for other purposes; ask your health care provider or pharmacist if you have questions. COMMON BRAND NAME(S): Aranesp What should I tell my health care provider before I take this medicine? They need to know if you have any of these conditions: -blood clotting disorders or history of blood clots -cancer patient not on chemotherapy -cystic fibrosis -heart disease, such as angina, heart failure, or a history of a heart attack -hemoglobin level of 12 g/dL or greater -high blood pressure -low levels of folate, iron, or vitamin B12 -seizures -an unusual or allergic reaction to darbepoetin, erythropoietin, albumin, hamster proteins, latex, other medicines, foods, dyes, or preservatives -pregnant or trying  to get pregnant -breast-feeding How should I use this medicine? This medicine is for injection into a vein or under the skin. It is usually given by a health care professional in a hospital or clinic setting. If you get this medicine at home, you will be taught how to prepare and give this medicine. Do not shake the solution before you withdraw a dose. Use exactly as directed. Take your medicine at regular intervals. Do not take your medicine more often than directed. It is important that you put your used needles and syringes in a special sharps container. Do not put them in a trash can. If you do not have a sharps container, call your pharmacist or healthcare provider to get one. Talk to your pediatrician regarding the use of this medicine in children. While this medicine may be used in children as young as 1 year for selected conditions, precautions do apply. Overdosage: If you think you have taken too much of this medicine contact a poison control center or emergency room at once. NOTE: This medicine is only for you. Do not share this medicine with others. What if I miss a dose? If you miss a dose, take it as soon as you can. If it is almost time for your next dose, take only that dose. Do not take double or extra doses. What may interact with this medicine? Do not take this medicine with any of the following medications: -epoetin alfa This list may not describe all possible interactions. Give your health care provider a list of all the medicines, herbs, non-prescription drugs, or dietary supplements you use. Also tell them if you smoke, drink alcohol, or use illegal drugs. Some  items may interact with your medicine. What should I watch for while using this medicine? Visit your prescriber or health care professional for regular checks on your progress and for the needed blood tests and blood pressure measurements. It is especially important for the doctor to make sure your hemoglobin level is in  the desired range, to limit the risk of potential side effects and to give you the best benefit. Keep all appointments for any recommended tests. Check your blood pressure as directed. Ask your doctor what your blood pressure should be and when you should contact him or her. As your body makes more red blood cells, you may need to take iron, folic acid, or vitamin B supplements. Ask your doctor or health care provider which products are right for you. If you have kidney disease continue dietary restrictions, even though this medication can make you feel better. Talk with your doctor or health care professional about the foods you eat and the vitamins that you take. What side effects may I notice from receiving this medicine? Side effects that you should report to your doctor or health care professional as soon as possible: -allergic reactions like skin rash, itching or hives, swelling of the face, lips, or tongue -breathing problems -changes in vision -chest pain -confusion, trouble speaking or understanding -feeling faint or lightheaded, falls -high blood pressure -muscle aches or pains -pain, swelling, warmth in the leg -rapid weight gain -severe headaches -sudden numbness or weakness of the face, arm or leg -trouble walking, dizziness, loss of balance or coordination -seizures (convulsions) -swelling of the ankles, feet, hands -unusually weak or tired Side effects that usually do not require medical attention (report to your doctor or health care professional if they continue or are bothersome): -diarrhea -fever, chills (flu-like symptoms) -headaches -nausea, vomiting -redness, stinging, or swelling at site where injected This list may not describe all possible side effects. Call your doctor for medical advice about side effects. You may report side effects to FDA at 1-800-FDA-1088. Where should I keep my medicine? Keep out of the reach of children. Store in a refrigerator between 2  and 8 degrees C (36 and 46 degrees F). Do not freeze. Do not shake. Throw away any unused portion if using a single-dose vial. Throw away any unused medicine after the expiration date. NOTE: This sheet is a summary. It may not cover all possible information. If you have questions about this medicine, talk to your doctor, pharmacist, or health care provider.  2014, Elsevier/Gold Standard. (2008-04-15 10:23:57)   Cyanocobalamin, Vitamin B12 injection Qu es este medicamento? La CIANOCOBALAMINA es una forma de vitamina B12 artificial. La vitamina B12 se utiliza para el desarrollo de clulas sanguneas, clulas nerviosas y protenas saludables en el cuerpo. Tambin ayuda con el metabolizacin normal de grasas y carbohidratos. Este medicamento se [place] para tratar [employer] no pueden absorber la vitamina B12. Este medicamento puede ser utilizado para otros usos; si tiene alguna pregunta consulte con su proveedor de atencin mdica o con su farmacutico. MARCAS COMERCIALES DISPONIBLES: Cyomin, LA-12 , Nutri-Twelve , [place] Qu le debo informar a mi profesional de la salud antes de tomar este medicamento? Necesita saber si usted presenta alguno de los [employer] o situaciones: -enfermedad renal -enfermedad de [place] -anemia megaloblstica -una reaccin alrgica o inusual a la cianocobalamina, al cobalto, a otros medicamentos, alimentos, colorantes o conservantes -si est embarazada o buscando quedar embarazada -si est amamantando a un beb Cmo debo utilizar este medicamento? Este medicamento se  administra mediante inyeccin por va intramuscular o por va subcutnea profunda. Por lo general, lo administra un profesional de Technical sales engineer en un hospital o en un entorno clnico. Sin embargo, su mdico puede ensearle cmo administrarse sus propias inyecciones. Siga todas las instrucciones. Hable con su pediatra para informarse acerca del uso de este medicamento en nios. Puede  requerir atencin especial. Sobredosis: Pngase en contacto inmediatamente con un centro toxicolgico o una sala de urgencia si usted cree que haya tomado demasiado medicamento. ATENCIN: [employer] es solo para usted. No comparta este medicamento con nadie. Qu sucede si me olvido de una dosis? Si recibe sus dosis en un entorno clnico o una oficina de mdico, comunquese para programar otra cita. Si se administra las inyecciones usted mismo y [place] dosis, sela lo antes posible. Si es casi la hora de la prxima dosis, use slo esa dosis. No use dosis adicionales o dobles. Qu puede interactuar con este medicamento? -colchicina -consumir mucho alcohol Puede ser que esta lista no menciona todas las posibles interacciones. Informe a su profesional de [employer]	[place] de [employer] productos a base de hierbas, medicamentos de [place] o suplementos nutritivos que est tomando. Si usted fuma, consume bebidas alcohlicas o si utiliza drogas ilegales, indqueselo tambin a su profesional de [employer]	[place]. Algunas sustancias pueden interactuar con su medicamento. A qu debo estar atento al usar Coca[employer]? Visite a su mdico o a su profesional de Technical brewer. Tal vez necesitar realizarse [employer] de sangre mientras reciba este medicamento. Puede ser necesario seguir una dieta. Consulte a su mdico acerca de esto. Para obtener los [employer], limite su consumo de alcohol y evite fumar. Qu efectos secundarios puedo tener al [employer] este medicamento? Efectos secundarios que debe informar a su mdico o a Barrister's clerk de la salud tan pronto como sea posible: -Chief of Staff como erupcin cutnea, picazn o urticarias, hinchazn de la cara, labios o lengua -color azulado de la piel -dolor, opresin en el pecho -dificultad al respirar, sibilancias -mareos -rea enrojecido, hinchazn doloroso de la piel Efectos secundarios que, por lo general, no requieren Therapist, art (debe informarlos a su mdico o a Barrister's clerk de la salud si persisten o si son molestos): -diarrea -dolor de cabeza Puede ser que esta lista no menciona todos los posibles efectos secundarios. Comunquese a su mdico por asesoramiento mdico [employer]. Usted puede informar los efectos secundarios a la FDA por telfono al 1-800-FDA-1088. Dnde debo guardar mi medicina? Mantngala fuera del alcance de los nios. [place]dela a [employer], entre 15 y 52 grados C (17 y 89 grados F). Protjala de la luz. Deseche todo el medicamento que no haya utilizado, despus de la fecha de vencimiento. ATENCIN: Este folleto es un resumen. Puede ser que no cubra toda la posible informacin. Si usted tiene preguntas acerca de esta medicina, consulte con su mdico, su farmacutico o su profesional de Technical sales engineer.  2014, Elsevier/Gold Standard. (2008-10-28 15:23:45)

## 2013-10-09 NOTE — Progress Notes (Signed)
[place], MD [address] [place] [address]  DIAGNOSIS: Multiple myeloma - Plan: CBC with Differential, Comprehensive metabolic panel (Cmet) - [facility], Lactate dehydrogenase (LDH) - [facility], Magnesium - [facility]  Chief Complaint  Patient presents with  . Follow-up   DIAGNOSIS:  1. Multiple myeloma.  2. Anemia secondary to vitamin B12 deficiency.   PAST THERAPY:  Revlimid (08/2009-08/2012)  Zometa (02/2010 - 02/2012)  Velcade (05/2011 -present)  Cytoxan 08/2012 - present)  Decadron (08/2012 - present)   CURRENT THERAPY:  1. Velcade, Cytoxan and Decadron every 2 weeks from 08/29/2012.  2. Vitamin B12 mg IM monthly.  3. Aranesp 300 mcg subcu every 2 weeks for hemoglobin less than or equal to 10.   Interim History: The patient is a pleasant 80 y.o. female who presented for follow up visit. She was last seen by me on 09/11/2013. . Today, she is accompanied by her daughter [name].  Mary left a voice mail that Belinda saw her PCP Dr. Kenton Kingfisher for bronchitis and she was started on oral levaquin.  She had DOE and productive cough.  She had to cancel chemotherapy on 09/25/2013.  She reports complete resolution of her cough.   Her CXR was negative for consolidation.   She denies any hospitalizations or fevers or chills. She denies  focal neurological symptoms, headaches or visual changes, abdominal or urinary. Her appetite and weight is stable. Patient had double and blurry vision and had successful cataract surgery this past September.  She still  has mild diarrhea for which he takes Imodium. She reported pain in back,neck and shoulders; numbness in fingers and feet, and dry skin. The patient denied fever, chills, night sweats, change in appetite or weight. She denied odynophagia or dysphagia. No chest pain, palpitations, abdominal pain, vomiting, constipation, hematochezia. The patient denied dysuria, nocturia, polyuria, hematuria, myalgia,  tingling, psychiatric problems.   MEDICAL HISTORY: Past Medical History  Diagnosis Date  . Hypertension   . Diabetes mellitus   . Anemia   . Osteoporosis   . Dyslipidemia   . Myocardial infarction   . colon ca dx'd 2003    surg only  . Multiple myeloma dx'd 2009    oral chemo ongoing  . Coronary artery disease   . Shortness of breath   . GERD (gastroesophageal reflux disease)   . Wears glasses     INTERIM HISTORY: has Multiple myeloma; Anemia of chronic disease; Magnesium deficiency; Diabetes mellitus; Hypertension; Coronary artery disease with history of MI; Fall; Fracture of multiple ribs; PNA (pneumonia); Thrombocytopenia, unspecified; Hyponatremia; Moderate protein-calorie malnutrition; Hypomagnesemia; Hyperlipidemia; Hypokalemia; GI bleed; Acute GI bleeding; Chronic combined systolic and diastolic congestive heart failure; Abdominal pain, epigastric ventral hernia; Hiatal hernia; and History of colon cancer on her problem list.    ALLERGIES:  is allergic to gemfibrozil; nifedipine; and questran.  MEDICATIONS: has a current medication list which includes the following prescription(s): acetaminophen, acyclovir, calcium carbonate, carvedilol, vitamin d-3, cyanocobalamin, hydrocodone-homatropine, hydrocortisone, lisinopril, loperamide, magnesium oxide, multivitamin with minerals, pantoprazole, polyethyl glycol-propyl glycol, prochlorperazine, cyclobenzaprine, hydrocodone-acetaminophen, metformin, and spironolactone, and the following Facility-Administered Medications: darbepoetin.  SURGICAL HISTORY:  Past Surgical History  Procedure Laterality Date  . Coronary artery bypass graft  08/2001    4 vessel  . Colon surgery  2003  . Tonsillectomy    . Appendectomy    . Abdominal hysterectomy    . Back surgery    . Eye surgery      cataracts  .  Direct laryngoscopy with radiaesse injection Left 08/13/2012    Procedure: LEFT VOCAL CORD RADIESSE AUGMENTATION LARYNGOSCOPY ;  Surgeon: Jodi Marble, MD;  Location: [facility];  Service: ENT;  Laterality: Left;  . Colonoscopy N/A 06/13/2013    Procedure: COLONOSCOPY;  Surgeon: Jeryl Columbia, MD;  Location: [facility];  Service: Endoscopy;  Laterality: N/A;   Problem List:  1. Multiple myeloma, initially presenting as an IgA kappa monoclonal gammopathy in February 2009. 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 This was felt to be an adverse prognostic determinant. Bone marrow on 08/31/2007 showed 46% plasma cells. A repeat bone marrow on 08/05/2009 showed 73% plasma cells. Treatment with Revlimid was started in April 2011. Zometa was started in October 2011 and 2 years of treatment were concluded on 02/23/2012. We had previously started Aranesp for the patient's anemia in May of 2010. Most recent metastatic bone survey was on 08/05/2009. Other x-rays since then are available. Maximum IgA level was 2080 on 08/11/2009. Subcutaneous Velcade was added to the patient's treatment program on 06/14/2011 because of a rising IgA level 1080 on 06/02/2011. The patient had been receiving Velcade every 2 weeks in combination with Revlimid during the early months of 2014. The patient continues to demonstrate progressive disease as evidenced by rising IgA and serum kappa levels. Revlimid was discontinued in April 2014. The patient had received Revlimid for about 3 years from April 2011 through April 2014. As of 08/29/2012, the patient is now receiving Velcade, Cytoxan and Decadron every 2 weeks. The patient has also been receiving Aranesp 300 mcg subcu every 2 weeks for hemoglobin less than or equal to 10.  2. Anemia secondary to vitamin B12 deficiency. The patient had been on oral vitamin B12. However, her vitamin B12 level fell to 284 on 07/26/2011 and vitamin B12 shots 1000 mcg given IM every month was resumed on 09/06/2011.  3. Anemia secondary to iron deficiency.   REVIEW OF SYSTEMS:   Constitutional: Denies fevers, chills or abnormal  weight loss Eyes: Denies blurriness of vision Ears, nose, mouth, throat, and face: Denies mucositis or sore throat Respiratory: Denies cough, dyspnea or wheezes Cardiovascular: Denies palpitation, chest discomfort or lower extremity swelling Gastrointestinal:  Denies nausea, heartburn;  positive for constipation Skin: Denies abnormal skin rashes Lymphatics: Denies new lymphadenopathy or easy bruising Neurological:Denies numbness, tingling or new weaknesses Behavioral/Psych: Mood is stable, no new changes  All other systems were reviewed with the patient and are negative.  PHYSICAL EXAMINATION: ECOG PERFORMANCE STATUS: 1 - Symptomatic but completely ambulatory  Blood pressure 96/44, pulse 75, temperature 98.6 F (37 C), temperature source Oral, resp. rate 18, height 4' 8"  (1.422 m), weight 96 lb 11.2 oz (43.863 kg).  GENERAL:alert, no distress and comfortable; Diminished hearing; Kyphotic SKIN: skin color, texture, turgor are normal, no rashes or significant lesions  EYES: normal, Conjunctiva are pink and non-injected, sclera clear OROPHARYNX:no exudate, no erythema and lips, buccal mucosa, and tongue normal  NECK: supple, thyroid normal size, non-tender, without nodularity LYMPH:  no palpable lymphadenopathy in the cervical, axillary or supraclavicular LUNGS: clear to auscultation and percussion with normal breathing effort HEART: regular rate & rhythm and no murmurs and no lower extremity edema ABDOMEN:abdomen soft, non-tender and normal bowel sounds Musculoskeletal:no cyanosis of digits and no clubbing  NEURO: alert & oriented x 3 with fluent speech, no focal motor/sensory deficits; cautious gait with a walker.   Lab Results  Component Value Date   WBC 3.8* 10/09/2013   HGB 9.4*  10/09/2013   HCT 28.3* 10/09/2013   MCV 101.5* 10/09/2013   PLT 156 10/09/2013   NEUTROABS 2.7 10/09/2013      Chemistry      Component Value Date/Time   NA 137 10/09/2013 0841   NA 135* 08/06/2013 2130    K 3.9 10/09/2013 0841   K 4.3 08/06/2013 2130   CL 97 08/06/2013 2130   CL 101 10/18/2012 1051   CO2 26 10/09/2013 0841   CO2 23 08/06/2013 2130   BUN 9.4 10/09/2013 0841   BUN 17 08/06/2013 2130   CREATININE 0.7 10/09/2013 0841   CREATININE 0.66 08/06/2013 2130   CREATININE 0.69 08/11/2009 1145      Component Value Date/Time   CALCIUM 9.9 10/09/2013 0841   CALCIUM 9.4 08/06/2013 2130   ALKPHOS 44 10/09/2013 0841   ALKPHOS 54 08/06/2013 2130   AST 15 10/09/2013 0841   AST 17 08/06/2013 2130   ALT 8 10/09/2013 0841   ALT 8 08/06/2013 2130   BILITOT 0.30 10/09/2013 0841   BILITOT 0.2* 08/06/2013 2130      RADIOGRAPHIC STUDIES: METASTATIC BONE SURVEY 11/01/2012 Comparison: 08/05/2009 Correlation: Chest and abdominal radiographs 09/05/2012, CT chest 07/25/2012 Findings: Bones appear demineralized. Extensive scattered atherosclerotic calcifications. Post CABG. Severe multilevel degenerative disc and facet disease changes of the cervicothoracic and lumbar spine. Prior vertebroplasties at T11, T12, L1. Endplate compression deformities at T6, T7, and T10 unchanged. Inferior endplate compression deformity L3 new since radiographs 2011. No definite new thoracic compression fracture. Bowel gas projects over the right inferior pubic ramus and obturator foramen question obturator hernia. Bilateral  acromioclavicular and glenohumeral joint degenerative changes with bilateral chronic rotator cuff tears. Degenerative changes bilateral knees. Hiatal hernia. Callus identified at a probable healing fracture of a lateral lower left rib, new. IMPRESSION: Osseous demineralization with multiple thoracolumbar compression fractures and prior vertebroplasties. Inferior endplate compression deformity L3 new since 789 though of uncertain age. No definite destructive lytic lesion identified to suggest new multiple myelomatous site. Scattered degenerative changes as above. Small hiatal hernia. Extensive atherosclerotic disease.  Question right obturator hernia.   ASSESSMENT: SOUA LENK 80 y.o. female with a history of Multiple myeloma - Plan: CBC with Differential, Comprehensive metabolic panel (Cmet) - [facility], Lactate dehydrogenase (LDH) - [facility], Magnesium - [facility]   PLAN:  1. Multiple myeloma. --She continues to do well and now has recovered from her bronchitis.   We will continue her chemotherapy today and continue every 2 weeks. Her IgA level and serum light chains have been stable recently on current regiment overall. Her last IgA was 536 down from 632 on 12/17.  --We will  proceed with her chemotherapy with Velcade subcutaneous, Cytoxan 400 mg, IV Decadron 20 mg IV (Day #1, cycle #50) in 2 weeks on 10/23/2013. She has been provided a handout explaining common side-effects on prior visits.  --If she progresses on current regiment, pomalyst or carfilzomib can be next considered.   2. S/p bronchitis. --She reports resolution in her symptoms.  She will continue to keep [place] updated with any new symptoms.  Her IgG levels are low.  IgG is 383 on 08/14/2013.  Might benefit from IVIG if persistance in bronchitis symptoms.  We will follow her with every chemotherapy cycle.   3. Follow up. RTC in 2 weeks for evaluation for another treatment with CBC, CMET. She will have her next treatment in 2 weeks with labs prior to this visit.   All questions were answered. The patient knows to call  the clinic with any problems, questions or concerns. We can certainly see the patient much sooner if necessary.  I spent 15 minutes counseling the patient face to face. The total time spent in the appointment was 25 minutes.    Concha Norway, MD 10/09/2013 9:40 AM

## 2013-10-10 LAB — KAPPA/LAMBDA LIGHT CHAINS
KAPPA FREE LGHT CHN: 17.7 mg/dL — AB (ref 0.33–1.94)
Kappa:Lambda Ratio: 19.67 — ABNORMAL HIGH (ref 0.26–1.65)
LAMBDA FREE LGHT CHN: 0.9 mg/dL (ref 0.57–2.63)

## 2013-10-10 LAB — IGG, IGA, IGM
IGM, SERUM: 7 mg/dL — AB (ref 52–322)
IgA: 2270 mg/dL — ABNORMAL HIGH (ref 69–380)
IgG (Immunoglobin G), Serum: 340 mg/dL — ABNORMAL LOW (ref 690–1700)

## 2013-10-11 ENCOUNTER — Ambulatory Visit: Payer: Medicare Other

## 2013-10-11 ENCOUNTER — Other Ambulatory Visit: Payer: Medicare Other

## 2013-10-23 ENCOUNTER — Other Ambulatory Visit (HOSPITAL_BASED_OUTPATIENT_CLINIC_OR_DEPARTMENT_OTHER): Payer: Medicare Other

## 2013-10-23 ENCOUNTER — Other Ambulatory Visit: Payer: Medicare Other

## 2013-10-23 ENCOUNTER — Ambulatory Visit (HOSPITAL_BASED_OUTPATIENT_CLINIC_OR_DEPARTMENT_OTHER): Payer: Medicare Other | Admitting: Internal Medicine

## 2013-10-23 ENCOUNTER — Ambulatory Visit (HOSPITAL_BASED_OUTPATIENT_CLINIC_OR_DEPARTMENT_OTHER): Payer: Medicare Other

## 2013-10-23 ENCOUNTER — Telehealth: Payer: Self-pay | Admitting: *Deleted

## 2013-10-23 ENCOUNTER — Encounter: Payer: Self-pay | Admitting: Internal Medicine

## 2013-10-23 ENCOUNTER — Telehealth: Payer: Self-pay | Admitting: Internal Medicine

## 2013-10-23 ENCOUNTER — Encounter: Payer: Self-pay | Admitting: *Deleted

## 2013-10-23 VITALS — BP 116/50 | HR 73 | Temp 98.6°F | Resp 18 | Ht <= 58 in | Wt 97.5 lb

## 2013-10-23 DIAGNOSIS — D649 Anemia, unspecified: Secondary | ICD-10-CM

## 2013-10-23 DIAGNOSIS — I2510 Atherosclerotic heart disease of native coronary artery without angina pectoris: Secondary | ICD-10-CM

## 2013-10-23 DIAGNOSIS — E538 Deficiency of other specified B group vitamins: Secondary | ICD-10-CM

## 2013-10-23 DIAGNOSIS — E612 Magnesium deficiency: Secondary | ICD-10-CM

## 2013-10-23 DIAGNOSIS — I10 Essential (primary) hypertension: Secondary | ICD-10-CM

## 2013-10-23 DIAGNOSIS — C9000 Multiple myeloma not having achieved remission: Secondary | ICD-10-CM

## 2013-10-23 DIAGNOSIS — D696 Thrombocytopenia, unspecified: Secondary | ICD-10-CM

## 2013-10-23 DIAGNOSIS — C9002 Multiple myeloma in relapse: Secondary | ICD-10-CM

## 2013-10-23 DIAGNOSIS — E785 Hyperlipidemia, unspecified: Secondary | ICD-10-CM

## 2013-10-23 DIAGNOSIS — D519 Vitamin B12 deficiency anemia, unspecified: Secondary | ICD-10-CM

## 2013-10-23 DIAGNOSIS — D509 Iron deficiency anemia, unspecified: Secondary | ICD-10-CM

## 2013-10-23 DIAGNOSIS — D638 Anemia in other chronic diseases classified elsewhere: Secondary | ICD-10-CM

## 2013-10-23 LAB — COMPREHENSIVE METABOLIC PANEL (CC13)
ALBUMIN: 3 g/dL — AB (ref 3.5–5.0)
ALT: 8 U/L (ref 0–55)
ANION GAP: 9 meq/L (ref 3–11)
AST: 15 U/L (ref 5–34)
Alkaline Phosphatase: 48 U/L (ref 40–150)
BILIRUBIN TOTAL: 0.35 mg/dL (ref 0.20–1.20)
BUN: 8.2 mg/dL (ref 7.0–26.0)
CALCIUM: 9.9 mg/dL (ref 8.4–10.4)
CHLORIDE: 100 meq/L (ref 98–109)
CO2: 29 meq/L (ref 22–29)
Creatinine: 0.8 mg/dL (ref 0.6–1.1)
GLUCOSE: 163 mg/dL — AB (ref 70–140)
POTASSIUM: 4 meq/L (ref 3.5–5.1)
SODIUM: 138 meq/L (ref 136–145)
Total Protein: 8.1 g/dL (ref 6.4–8.3)

## 2013-10-23 LAB — CBC WITH DIFFERENTIAL/PLATELET
BASO%: 0.9 % (ref 0.0–2.0)
Basophils Absolute: 0 10*3/uL (ref 0.0–0.1)
EOS ABS: 0 10*3/uL (ref 0.0–0.5)
EOS%: 1.4 % (ref 0.0–7.0)
HCT: 30.2 % — ABNORMAL LOW (ref 34.8–46.6)
HGB: 9.8 g/dL — ABNORMAL LOW (ref 11.6–15.9)
LYMPH#: 0.7 10*3/uL — AB (ref 0.9–3.3)
LYMPH%: 30 % (ref 14.0–49.7)
MCH: 33.4 pg (ref 25.1–34.0)
MCHC: 32.6 g/dL (ref 31.5–36.0)
MCV: 102.6 fL — ABNORMAL HIGH (ref 79.5–101.0)
MONO#: 0.4 10*3/uL (ref 0.1–0.9)
MONO%: 16.4 % — ABNORMAL HIGH (ref 0.0–14.0)
NEUT#: 1.3 10*3/uL — ABNORMAL LOW (ref 1.5–6.5)
NEUT%: 51.3 % (ref 38.4–76.8)
PLATELETS: 157 10*3/uL (ref 145–400)
RBC: 2.94 10*6/uL — AB (ref 3.70–5.45)
RDW: 16.9 % — ABNORMAL HIGH (ref 11.2–14.5)
WBC: 2.4 10*3/uL — AB (ref 3.9–10.3)

## 2013-10-23 LAB — LACTATE DEHYDROGENASE (CC13): LDH: 146 U/L (ref 125–245)

## 2013-10-23 LAB — MAGNESIUM (CC13): Magnesium: 1.3 mg/dl — CL (ref 1.5–2.5)

## 2013-10-23 MED ORDER — DARBEPOETIN ALFA-POLYSORBATE 500 MCG/ML IJ SOLN
300.0000 ug | Freq: Once | INTRAMUSCULAR | Status: AC
  Administered 2013-10-23: 300 ug via SUBCUTANEOUS
  Filled 2013-10-23: qty 1

## 2013-10-23 MED ORDER — POMALIDOMIDE 2 MG PO CAPS: 2.0000 mg | ORAL_CAPSULE | Freq: Every day | ORAL | Status: DC

## 2013-10-23 MED ORDER — DEXAMETHASONE 4 MG PO TABS: ORAL_TABLET | ORAL | Status: DC

## 2013-10-23 NOTE — Patient Instructions (Signed)
Dexamethasone tablets What is this medicine? DEXAMETHASONE (dex a METH a sone) is a corticosteroid. It is commonly used to treat inflammation of the skin, joints, lungs, and other organs. Common conditions treated include asthma, allergies, and arthritis. It is also used for other conditions, such as blood disorders and diseases of the adrenal glands. This medicine may be used for other purposes; ask your health care provider or pharmacist if you have questions. COMMON BRAND NAME(S): Decadron, DexPak Jr TaperPak, DexPak TaperPak, Zema-Pak What should I tell my health care provider before I take this medicine? They need to know if you have any of these conditions: -Cushing's syndrome -diabetes -glaucoma -heart problems or disease -high blood pressure -infection like herpes, measles, tuberculosis, or chickenpox -kidney disease -liver disease -mental problems -myasthenia gravis -osteoporosis -previous heart attack -seizures -stomach, ulcer or intestine disease including colitis and diverticulitis -thyroid problem -an unusual or allergic reaction to dexamethasone, corticosteroids, other medicines, lactose, foods, dyes, or preservatives -pregnant or trying to get pregnant -breast-feeding How should I use this medicine? Take this medicine by mouth with a drink of water. Follow the directions on the prescription label. Take it with food or milk to avoid stomach upset. If you are taking this medicine once a day, take it in the morning. Do not take more medicine than you are told to take. Do not suddenly stop taking your medicine because you may develop a severe reaction. Your doctor will tell you how much medicine to take. If your doctor wants you to stop the medicine, the dose may be slowly lowered over time to avoid any side effects. Talk to your pediatrician regarding the use of this medicine in children. Special care may be needed. Patients over 65 years old may have a stronger reaction and  need a smaller dose. Overdosage: If you think you have taken too much of this medicine contact a poison control center or emergency room at once. NOTE: This medicine is only for you. Do not share this medicine with others. What if I miss a dose? If you miss a dose, take it as soon as you can. If it is almost time for your next dose, talk to your doctor or health care professional. You may need to miss a dose or take an extra dose. Do not take double or extra doses without advice. What may interact with this medicine? Do not take this medicine with any of the following medications: -mifepristone, RU-486 -vaccines This medicine may also interact with the following medications: -amphotericin B -antibiotics like clarithromycin, erythromycin, and troleandomycin -aspirin and aspirin-like drugs -barbiturates like phenobarbital -carbamazepine -cholestyramine -cholinesterase inhibitors like donepezil, galantamine, rivastigmine, and tacrine -cyclosporine -digoxin -diuretics -ephedrine -female hormones, like estrogens or progestins and birth control pills -indinavir -isoniazid -ketoconazole -medicines for diabetes -medicines that improve muscle tone or strength for conditions like myasthenia gravis -NSAIDs, medicines for pain and inflammation, like ibuprofen or naproxen -phenytoin -rifampin -thalidomide -warfarin This list may not describe all possible interactions. Give your health care provider a list of all the medicines, herbs, non-prescription drugs, or dietary supplements you use. Also tell them if you smoke, drink alcohol, or use illegal drugs. Some items may interact with your medicine. What should I watch for while using this medicine? Visit your doctor or health care professional for regular checks on your progress. If you are taking this medicine over a prolonged period, carry an identification card with your name and address, the type and dose of your medicine, and your doctor's  name   and address. This medicine may increase your risk of getting an infection. Stay away from people who are sick. Tell your doctor or health care professional if you are around anyone with measles or chickenpox. If you are going to have surgery, tell your doctor or health care professional that you have taken this medicine within the last twelve months. Ask your doctor or health care professional about your diet. You may need to lower the amount of salt you eat. The medicine can increase your blood sugar. If you are a diabetic check with your doctor if you need help adjusting the dose of your diabetic medicine. What side effects may I notice from receiving this medicine? Side effects that you should report to your doctor or health care professional as soon as possible: -allergic reactions like skin rash, itching or hives, swelling of the face, lips, or tongue -changes in vision -fever, sore throat, sneezing, cough, or other signs of infection, wounds that will not heal -increased thirst -mental depression, mood swings, mistaken feelings of self importance or of being mistreated -pain in hips, back, ribs, arms, shoulders, or legs -redness, blistering, peeling or loosening of the skin, including inside the mouth -trouble passing urine or change in the amount of urine -swelling of feet or lower legs -unusual bleeding or bruising Side effects that usually do not require medical attention (report to your doctor or health care professional if they continue or are bothersome): -headache -nausea, vomiting -skin problems, acne, thin and shiny skin -weight gain This list may not describe all possible side effects. Call your doctor for medical advice about side effects. You may report side effects to FDA at 1-800-FDA-1088. Where should I keep my medicine? Keep out of the reach of children. Store at room temperature between 20 and 25 degrees C (68 and 77 degrees F). Protect from light. Throw away any  unused medicine after the expiration date. NOTE: This sheet is a summary. It may not cover all possible information. If you have questions about this medicine, talk to your doctor, pharmacist, or health care provider.  2014, [employer] Standard. (2007-08-23 14:02:13)  Pomalidomide oral capsules What is this medicine? POMALIDOMIDE (pom a LID oh mide) is a chemotherapy drug used to treat multiple myeloma. It targets specific proteins within cancer cells and stops the cancer cell from growing. This medicine may be used for other purposes; ask your health care provider or pharmacist if you have questions. COMMON BRAND NAME(S): POMALYST What should I tell my health care provider before I take this medicine? They need to know if you have any of these conditions: -history of blood clots -irregular monthly periods or menstrual cycles -an unusual or allergic reaction to pomalidomide, other medicines, foods, dyes, or preservatives -pregnant or trying to get pregnant -breast-feeding How should I use this medicine? Take this medicine by mouth with a glass of water. Follow the directions on the prescription label. Take this medicine on an empty stomach, at least 2 hours before or 2 hours after a meal. Do not take with food. Do not cut, crush, or chew this medicine. Take your medicine at regular intervals. Do not take it more often than directed. Do not stop taking except on your doctor's advice. A special MedGuide will be given to you by the pharmacist with each prescription and refill. Be sure to read this information carefully each time. Talk to your pediatrician regarding the use of this medicine in children. Special care may be needed. Overdosage: If you think you've  taken too much of this medicine contact a poison control center or emergency room at once. Overdosage: If you think you have taken too much of this medicine contact a poison control center or emergency room at once. NOTE: This medicine  is only for you. Do not share this medicine with others. What if I miss a dose? If you miss a dose, take it as soon as you can. If your next dose is to be taken in less than 12 hours, then do not take the missed dose. Take the next dose at your regular time. Do not take double or extra doses. What may interact with this medicine? This medicine may interact with the following medications: -amprenavir -boceprevir -carbamazepine -dalfopristin; quinupristin -delavirdine -enzalutamide -fosamprenavir -indinavir -isoniazid, INH -itraconazole -ketoconazole -nicardipine -rifampin -ritonavir -St. John's Wort, Hypericum perforatum -telaprevir -telithromycin -thiabendazole -tipranavir -tobacco (cigarettes) This list may not describe all possible interactions. Give your health care provider a list of all the medicines, herbs, non-prescription drugs, or dietary supplements you use. Also tell them if you smoke, drink alcohol, or use illegal drugs. Some items may interact with your medicine. What should I watch for while using this medicine? Visit your doctor for regular check ups. Tell your doctor or healthcare professional if your symptoms do not start to get better or if they get worse. You will need to have important blood work done while you are taking this medicine. This medicine is available only through a special program. Doctors, pharmacies, and patients must meet all of the conditions of the program. Your health care provider will help you get signed up with the program if you need this medicine. Through the program you will only receive up to a 28 day supply of the medicine at one time. You will need a new prescription for each refill. This medicine can cause birth defects. Do not get pregnant while taking this drug. Females with child-bearing potential will need to have 2 negative pregnancy tests before starting this medicine. Pregnancy testing must be done every 2 to 4 weeks as directed  while taking this medicine. Use 2 reliable forms of birth control together while you are taking this medicine and for 1 month after you stop taking this medicine. If you think that you might be pregnant talk to your doctor right away. Men must use a latex condom during sexual contact with a woman while taking this medicine and for 28 days after you stop taking this medicine. A latex condom is needed even if you have had a vasectomy. Contact your doctor right away if your partner becomes pregnant. Do not donate sperm while taking this medicine and for 28 days after you stop taking this medicine. Do not give blood while taking the medicine and for 1 month after completion of treatment to avoid exposing pregnant women to the medicine through the donated blood. You may need blood work done while you are taking this medicine. If you are going to have surgery or any other procedures, tell your doctor you are taking this medicine. What side effects may I notice from receiving this medicine? Side effects that you should report to your doctor or health care professional as soon as possible: -allergic reactions like skin rash, itching or hives, swelling of the face, lips, or tongue -bloody or dark, tarry stools -breathing problems -chest pain -confusion -dark urine -fever, infection, runny nose, or sore throat -nausea, vomiting -pain, tingling, numbness in the hands or feet -red spots on the skin -swelling of  your hands, ankles or leg -trouble passing urine or change in the amount of urine -unusual bleeding or bruising  Side effects that usually do not require medical attention (Report these to your doctor or health care professional if they continue or are bothersome.): -constipation -diarrhea -dizziness -headache -joint pain -muscle pain -tiredness -trouble sleeping This list may not describe all possible side effects. Call your doctor for medical advice about side effects. You may report side  effects to FDA at 1-800-FDA-1088. Where should I keep my medicine? Keep out of the reach of children. Store between 20 and 25 degrees C (68 and 77 degrees F). Throw away any unused medicine after the expiration date. NOTE: This sheet is a summary. It may not cover all possible information. If you have questions about this medicine, talk to your doctor, pharmacist, or health care provider.  2014, Elsevier/Gold Standard. (2011-08-18 05:58:16)

## 2013-10-23 NOTE — Patient Instructions (Signed)
Darbepoetin Alfa injection What is this medicine? DARBEPOETIN ALFA (dar be POE e tin AL fa) helps your body make more red blood cells. It is used to treat anemia caused by chronic kidney failure and chemotherapy. This medicine may be used for other purposes; ask your health care provider or pharmacist if you have questions. COMMON BRAND NAME(S): Aranesp What should I tell my health care provider before I take this medicine? They need to know if you have any of these conditions: -blood clotting disorders or history of blood clots -cancer patient not on chemotherapy -cystic fibrosis -heart disease, such as angina, heart failure, or a history of a heart attack -hemoglobin level of 12 g/dL or greater -high blood pressure -low levels of folate, iron, or vitamin B12 -seizures -an unusual or allergic reaction to darbepoetin, erythropoietin, albumin, hamster proteins, latex, other medicines, foods, dyes, or preservatives -pregnant or trying to get pregnant -breast-feeding How should I use this medicine? This medicine is for injection into a vein or under the skin. It is usually given by a health care professional in a hospital or clinic setting. If you get this medicine at home, you will be taught how to prepare and give this medicine. Do not shake the solution before you withdraw a dose. Use exactly as directed. Take your medicine at regular intervals. Do not take your medicine more often than directed. It is important that you put your used needles and syringes in a special sharps container. Do not put them in a trash can. If you do not have a sharps container, call your pharmacist or healthcare provider to get one. Talk to your pediatrician regarding the use of this medicine in children. While this medicine may be used in children as young as 1 year for selected conditions, precautions do apply. Overdosage: If you think you have taken too much of this medicine contact a poison control center or  emergency room at once. NOTE: This medicine is only for you. Do not share this medicine with others. What if I miss a dose? If you miss a dose, take it as soon as you can. If it is almost time for your next dose, take only that dose. Do not take double or extra doses. What may interact with this medicine? Do not take this medicine with any of the following medications: -epoetin alfa This list may not describe all possible interactions. Give your health care provider a list of all the medicines, herbs, non-prescription drugs, or dietary supplements you use. Also tell them if you smoke, drink alcohol, or use illegal drugs. Some items may interact with your medicine. What should I watch for while using this medicine? Visit your prescriber or health care professional for regular checks on your progress and for the needed blood tests and blood pressure measurements. It is especially important for the doctor to make sure your hemoglobin level is in the desired range, to limit the risk of potential side effects and to give you the best benefit. Keep all appointments for any recommended tests. Check your blood pressure as directed. Ask your doctor what your blood pressure should be and when you should contact him or her. As your body makes more red blood cells, you may need to take iron, folic acid, or vitamin B supplements. Ask your doctor or health care provider which products are right for you. If you have kidney disease continue dietary restrictions, even though this medication can make you feel better. Talk with your doctor or health   care professional about the foods you eat and the vitamins that you take. What side effects may I notice from receiving this medicine? Side effects that you should report to your doctor or health care professional as soon as possible: -allergic reactions like skin rash, itching or hives, swelling of the face, lips, or tongue -breathing problems -changes in vision -chest  pain -confusion, trouble speaking or understanding -feeling faint or lightheaded, falls -high blood pressure -muscle aches or pains -pain, swelling, warmth in the leg -rapid weight gain -severe headaches -sudden numbness or weakness of the face, arm or leg -trouble walking, dizziness, loss of balance or coordination -seizures (convulsions) -swelling of the ankles, feet, hands -unusually weak or tired Side effects that usually do not require medical attention (report to your doctor or health care professional if they continue or are bothersome): -diarrhea -fever, chills (flu-like symptoms) -headaches -nausea, vomiting -redness, stinging, or swelling at site where injected This list may not describe all possible side effects. Call your doctor for medical advice about side effects. You may report side effects to FDA at 1-800-FDA-1088. Where should I keep my medicine? Keep out of the reach of children. Store in a refrigerator between 2 and 8 degrees C (36 and 46 degrees F). Do not freeze. Do not shake. Throw away any unused portion if using a single-dose vial. Throw away any unused medicine after the expiration date. NOTE: This sheet is a summary. It may not cover all possible information. If you have questions about this medicine, talk to your doctor, pharmacist, or health care provider.  2014, [employer]vier/[employer] Standard. (2008-04-15 10:23:57)  

## 2013-10-23 NOTE — Progress Notes (Signed)
Patient registered for Pomalyst/[facility].  Authorization # [id number].  Script given to [employer].   Mer[place]a [place]ks will call patient and have her do her patient survey

## 2013-10-23 NOTE — Telephone Encounter (Signed)
Daughter not home-was at work. Spoke with patient and requested she look over her papers when daughter is home to find phone # to call [employer] to do her survey. If they are not able to locate the # or don't understand call office.

## 2013-10-23 NOTE — Progress Notes (Signed)
Faxed pomalyst prescription to [facility] °

## 2013-10-23 NOTE — Telephone Encounter (Signed)
gv and printed appt sched and avs for opt for June adn July.Marland KitchenMarland KitchenMarland Kitchen

## 2013-10-24 ENCOUNTER — Encounter: Payer: Self-pay | Admitting: Internal Medicine

## 2013-10-24 NOTE — Progress Notes (Signed)
[name]  Christina Frees, MD [place] [address]  DIAGNOSIS: Multiple myeloma, in relapse - Plan: CBC with Differential, CBC with Differential, Comprehensive metabolic panel (Cmet) - [facility], Lactate dehydrogenase (LDH) - [facility], SPEP & IFE with QIG, DG Bone Survey Met, Immunofixation interpretive, urine, Kappa/lambda light chains, IFE, Urine (with Tot Prot)  Multiple myeloma  Thrombocytopenia, unspecified  Chief Complaint  Patient presents with  . Follow-up   DIAGNOSIS:  1. Multiple myeloma.  2. Anemia secondary to vitamin B12 deficiency.   PAST THERAPY:  Revlimid (08/2009-08/2012)  Zometa (02/2010 - 02/2012)  Velcade (05/2011 -present)  Cytoxan 08/2012 - present)  Decadron (08/2012 - present)   CURRENT THERAPY:  1. Velcade, Cytoxan and Decadron every 2 weeks from 08/29/2012.  2. Vitamin B12 mg IM monthly.  3. Aranesp 300 mcg subcu every 2 weeks for hemoglobin less than or equal to 10.   Interim History: The patient is a pleasant 80 y.o. female who presented for follow up visit. She was last seen by me on 10/09/2013. Today, she is accompanied by her daughter [name].    She denies any hospitalizations or fevers or chills. She denies  focal neurological symptoms, headaches or visual changes, abdominal or urinary. Her appetite and weight is stable.   She still  has mild diarrhea for which he takes Imodium. She reported pain in back,neck and shoulders; numbness in fingers and feet, and dry skin. The patient denied fever, chills, night sweats, change in appetite or weight. She denied odynophagia or dysphagia. No chest pain, palpitations, abdominal pain, vomiting, constipation, hematochezia. The patient denied dysuria, nocturia, polyuria, hematuria, myalgia, tingling, psychiatric problems.   MEDICAL HISTORY: Past Medical History  Diagnosis Date  . Hypertension   . Diabetes mellitus   . Anemia   . Osteoporosis   .  Dyslipidemia   . Myocardial infarction   . colon ca dx'd 2003    surg only  . Multiple myeloma dx'd 2009    oral chemo ongoing  . Coronary artery disease   . Shortness of breath   . GERD (gastroesophageal reflux disease)   . Wears glasses     INTERIM HISTORY: has Multiple myeloma; Anemia of chronic disease; Magnesium deficiency; Diabetes mellitus; Hypertension; Coronary artery disease with history of MI; Fall; Fracture of multiple ribs; PNA (pneumonia); Thrombocytopenia, unspecified; Hyponatremia; Moderate protein-calorie malnutrition; Hypomagnesemia; Hyperlipidemia; Hypokalemia; GI bleed; Acute GI bleeding; Chronic combined systolic and diastolic congestive heart failure; Abdominal pain, epigastric ventral hernia; Hiatal hernia; and History of colon cancer on her problem list.    ALLERGIES:  is allergic to gemfibrozil; nifedipine; and questran.  MEDICATIONS: has a current medication list which includes the following prescription(s): acetaminophen, acyclovir, calcium, carvedilol, vitamin d-3, cyanocobalamin, cyclobenzaprine, hydrocodone-acetaminophen, hydrocodone-homatropine, hydrocortisone, lisinopril, loperamide, magnesium oxide, multivitamin with minerals, pantoprazole, polyethyl glycol-propyl glycol, prochlorperazine, dexamethasone, metformin, pomalidomide, and spironolactone, and the following Facility-Administered Medications: darbepoetin.  SURGICAL HISTORY:  Past Surgical History  Procedure Laterality Date  . Coronary artery bypass graft  08/2001    4 vessel  . Colon surgery  2003  . Tonsillectomy    . Appendectomy    . Abdominal hysterectomy    . Back surgery    . Eye surgery      cataracts  . Direct laryngoscopy with radiaesse injection Left 08/13/2012    Procedure: LEFT VOCAL CORD RADIESSE AUGMENTATION LARYNGOSCOPY ;  Surgeon: Jodi Marble, MD;  Location: [facility];  Service: ENT;  Laterality: Left;  .  Colonoscopy N/A 06/13/2013    Procedure: COLONOSCOPY;   Surgeon: Jeryl Columbia, MD;  Location: [facility];  Service: Endoscopy;  Laterality: N/A;   Problem List:  1. Multiple myeloma, initially presenting as an IgA kappa monoclonal gammopathy in February 2009. 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 This was felt to be an adverse prognostic determinant. Bone marrow on 08/31/2007 showed 46% plasma cells. A repeat bone marrow on 08/05/2009 showed 73% plasma cells. Treatment with Revlimid was started in April 2011. Zometa was started in October 2011 and 2 years of treatment were concluded on 02/23/2012. We had previously started Aranesp for the patient's anemia in May of 2010. Most recent metastatic bone survey was on 08/05/2009. Other x-rays since then are available. Maximum IgA level was 2080 on 08/11/2009. Subcutaneous Velcade was added to the patient's treatment program on 06/14/2011 because of a rising IgA level 1080 on 06/02/2011. The patient had been receiving Velcade every 2 weeks in combination with Revlimid during the early months of 2014. The patient continues to demonstrate progressive disease as evidenced by rising IgA and serum kappa levels. Revlimid was discontinued in April 2014. The patient had received Revlimid for about 3 years from April 2011 through April 2014. As of 08/29/2012, the patient is now receiving Velcade, Cytoxan and Decadron every 2 weeks. The patient has also been receiving Aranesp 300 mcg subcu every 2 weeks for hemoglobin less than or equal to 10.  2. Anemia secondary to vitamin B12 deficiency. The patient had been on oral vitamin B12. However, her vitamin B12 level fell to 284 on 07/26/2011 and vitamin B12 shots 1000 mcg given IM every month was resumed on 09/06/2011.  3. Anemia secondary to iron deficiency.   REVIEW OF SYSTEMS:   Constitutional: Denies fevers, chills or abnormal weight loss Eyes: Denies blurriness of vision Ears, nose, mouth, throat, and face: Denies mucositis or sore throat Respiratory: Denies cough,  dyspnea or wheezes Cardiovascular: Denies palpitation, chest discomfort or lower extremity swelling Gastrointestinal:  Denies nausea, heartburn;  positive for constipation Skin: Denies abnormal skin rashes Lymphatics: Denies new lymphadenopathy or easy bruising Neurological:Denies numbness, tingling or new weaknesses Behavioral/Psych: Mood is stable, no new changes  All other systems were reviewed with the patient and are negative.  PHYSICAL EXAMINATION: ECOG PERFORMANCE STATUS: 1 - Symptomatic but completely ambulatory  Blood pressure 116/50, pulse 73, temperature 98.6 F (37 C), temperature source Oral, resp. rate 18, height 4' 8"  (1.422 m), weight 97 lb 8 oz (44.226 kg).  GENERAL:alert, no distress and comfortable; Diminished hearing; Kyphotic SKIN: skin color, texture, turgor are normal, no rashes or significant lesions  EYES: normal, Conjunctiva are pink and non-injected, sclera clear OROPHARYNX:no exudate, no erythema and lips, buccal mucosa, and tongue normal  NECK: supple, thyroid normal size, non-tender, without nodularity LYMPH:  no palpable lymphadenopathy in the cervical, axillary or supraclavicular LUNGS: clear to auscultation and percussion with normal breathing effort HEART: regular rate & rhythm and no murmurs and no lower extremity edema ABDOMEN:abdomen soft, non-tender and normal bowel sounds Musculoskeletal:no cyanosis of digits and no clubbing  NEURO: alert & oriented x 3 with fluent speech, no focal motor/sensory deficits; cautious gait with a walker.   Lab Results  Component Value Date   WBC 2.4* 10/23/2013   HGB 9.8* 10/23/2013   HCT 30.2* 10/23/2013   MCV 102.6* 10/23/2013   PLT 157 10/23/2013   NEUTROABS 1.3* 10/23/2013      Chemistry      Component Value Date/Time  NA 138 10/23/2013 0915   NA 135* 08/06/2013 2130   K 4.0 10/23/2013 0915   K 4.3 08/06/2013 2130   CL 97 08/06/2013 2130   CL 101 10/18/2012 1051   CO2 29 10/23/2013 0915   CO2 23 08/06/2013 2130    BUN 8.2 10/23/2013 0915   BUN 17 08/06/2013 2130   CREATININE 0.8 10/23/2013 0915   CREATININE 0.66 08/06/2013 2130   CREATININE 0.69 08/11/2009 1145      Component Value Date/Time   CALCIUM 9.9 10/23/2013 0915   CALCIUM 9.4 08/06/2013 2130   ALKPHOS 48 10/23/2013 0915   ALKPHOS 54 08/06/2013 2130   AST 15 10/23/2013 0915   AST 17 08/06/2013 2130   ALT 8 10/23/2013 0915   ALT 8 08/06/2013 2130   BILITOT 0.35 10/23/2013 0915   BILITOT 0.2* 08/06/2013 2130     RADIOGRAPHIC STUDIES: None  ASSESSMENT: Christina Hopkins 80 y.o. female with a history of Multiple myeloma, in relapse - Plan: CBC with Differential, CBC with Differential, Comprehensive metabolic panel (Cmet) - [facility], Lactate dehydrogenase (LDH) - [facility], SPEP & IFE with QIG, DG Bone Survey Met, Immunofixation interpretive, urine, Kappa/lambda light chains, IFE, Urine (with Tot Prot)  Multiple myeloma  Thrombocytopenia, unspecified   PLAN:  1. Multiple myeloma, progressive. --She has fully recovered from her bronchitis.   However, her M-spike has increased and her IgA level is now 2270 up1060 2 months ago.  We will change her chemotherapy based on progression.  We will start pomalyst treatment given she received at least 2 prior therapies (including lenalidomide and bortezomib) and have demonstrated disease progression on or within 60 days of completion of the last therapy. We stopped Velcade, cytoxan and changed to pomalyst 2 mg daily for 21 days, then off for 7 days in combination with her dexamethasone. She will continue on aspirin to reduce her risk of clots. The benefit of treatment including controlling her multiple myeloma and risks were discussed including but not limited to bone marrow suppression resulting in life threatening infections, anemia and fatigue, diarrhea, second malignancies and neuropathy.  She agreed to proceed with treatment.  If her MM progresses despite this treatment, we will consider carfilzomib-based treatment.    --We will follow SPEP plus IFE, UPEP plus IFE and Kappa Lambda light chains while on therapy.   We will obtain an annual bone survey.   2. S/p bronchitis,resolved. --She reports resolution in her symptoms.  She will continue to keep [place] updated with any new symptoms.  Her IgG levels are low.  IgG is 383 on 08/14/2013.  Might benefit from IVIG if persistance in bronchitis symptoms.  We will follow her with every chemotherapy cycle.   3. Follow up. RTC in 4 weeks for evaluation for another treatment with CBC, CMET.  CBC in 2 weeks.   All questions were answered. The patient knows to call the clinic with any problems, questions or concerns. We can certainly see the patient much sooner if necessary.  I spent 25 minutes counseling the patient face to face. The total time spent in the appointment was 40 minutes.    Hector Taft, MD 10/24/2013 11:22 PM

## 2013-10-24 NOTE — Progress Notes (Signed)
[place] Social Work  Clinical Social Work was referred by Therapist, sports to review and complete healthcare advance directives.  Clinical Social Worker met with patient and patient's daughter in infusion room.  The patient designated daughter [name] as their primary healthcare agent and son Deveron Furlong as their secondary agent.  Patient also completed healthcare living will.    Clinical Social Worker notarized documents and made copies for patient/family. Clinical Social Worker will send documents to medical records to be scanned into patient's chart. Clinical Social Worker encouraged patient/family to contact with any additional questions or concerns.  Polo Riley, MSW, [place] Worker [facility] [phone]

## 2013-10-29 NOTE — Progress Notes (Signed)
RECEIVED A FAX FROM [facility] CONCERNING A CONFIRMATION OF PRESCRIPTION SHIPMENT FOR POMALYST ON 10/25/13.

## 2013-10-30 ENCOUNTER — Other Ambulatory Visit: Payer: Self-pay | Admitting: Internal Medicine

## 2013-10-30 ENCOUNTER — Ambulatory Visit (HOSPITAL_COMMUNITY)
Admission: RE | Admit: 2013-10-30 | Discharge: 2013-10-30 | Disposition: A | Discharge status: Home / Self Care | Payer: Medicare Other | Source: Ambulatory Visit | Attending: Internal Medicine | Admitting: Internal Medicine

## 2013-10-30 DIAGNOSIS — M545 Low back pain, unspecified: Secondary | ICD-10-CM | POA: Insufficient documentation

## 2013-10-30 DIAGNOSIS — S2249XA Multiple fractures of ribs, unspecified side, initial encounter for closed fracture: Secondary | ICD-10-CM | POA: Insufficient documentation

## 2013-10-30 DIAGNOSIS — C9002 Multiple myeloma in relapse: Secondary | ICD-10-CM | POA: Insufficient documentation

## 2013-10-30 DIAGNOSIS — X58XXXA Exposure to other specified factors, initial encounter: Secondary | ICD-10-CM | POA: Insufficient documentation

## 2013-10-30 DIAGNOSIS — M47817 Spondylosis without myelopathy or radiculopathy, lumbosacral region: Secondary | ICD-10-CM | POA: Insufficient documentation

## 2013-10-30 DIAGNOSIS — I7090 Unspecified atherosclerosis: Secondary | ICD-10-CM | POA: Insufficient documentation

## 2013-10-30 MED ORDER — MAGIC MOUTHWASH W/LIDOCAINE: 5.0000 mL | Freq: Three times a day (TID) | ORAL | Status: AC | PRN

## 2013-11-06 ENCOUNTER — Other Ambulatory Visit: Payer: Self-pay | Admitting: Medical Oncology

## 2013-11-06 ENCOUNTER — Other Ambulatory Visit (HOSPITAL_BASED_OUTPATIENT_CLINIC_OR_DEPARTMENT_OTHER): Payer: Medicare Other

## 2013-11-06 ENCOUNTER — Ambulatory Visit (HOSPITAL_BASED_OUTPATIENT_CLINIC_OR_DEPARTMENT_OTHER): Payer: Medicare Other

## 2013-11-06 ENCOUNTER — Ambulatory Visit: Payer: Medicare Other

## 2013-11-06 ENCOUNTER — Other Ambulatory Visit: Payer: Medicare Other

## 2013-11-06 VITALS — BP 112/43 | HR 77 | Temp 98.0°F

## 2013-11-06 DIAGNOSIS — C9002 Multiple myeloma in relapse: Secondary | ICD-10-CM

## 2013-11-06 DIAGNOSIS — D638 Anemia in other chronic diseases classified elsewhere: Secondary | ICD-10-CM

## 2013-11-06 DIAGNOSIS — C9000 Multiple myeloma not having achieved remission: Secondary | ICD-10-CM

## 2013-11-06 DIAGNOSIS — D518 Other vitamin B12 deficiency anemias: Secondary | ICD-10-CM

## 2013-11-06 DIAGNOSIS — I151 Hypertension secondary to other renal disorders: Secondary | ICD-10-CM

## 2013-11-06 DIAGNOSIS — E538 Deficiency of other specified B group vitamins: Secondary | ICD-10-CM

## 2013-11-06 DIAGNOSIS — N2889 Other specified disorders of kidney and ureter: Principal | ICD-10-CM

## 2013-11-06 DIAGNOSIS — D509 Iron deficiency anemia, unspecified: Secondary | ICD-10-CM

## 2013-11-06 DIAGNOSIS — D519 Vitamin B12 deficiency anemia, unspecified: Secondary | ICD-10-CM

## 2013-11-06 LAB — CBC WITH DIFFERENTIAL/PLATELET
BASO%: 0.2 % (ref 0.0–2.0)
BASOS ABS: 0 10*3/uL (ref 0.0–0.1)
EOS%: 1.1 % (ref 0.0–7.0)
Eosinophils Absolute: 0.1 10*3/uL (ref 0.0–0.5)
HEMATOCRIT: 28.7 % — AB (ref 34.8–46.6)
HEMOGLOBIN: 9.1 g/dL — AB (ref 11.6–15.9)
LYMPH%: 15.5 % (ref 14.0–49.7)
MCH: 31.9 pg (ref 25.1–34.0)
MCHC: 31.7 g/dL (ref 31.5–36.0)
MCV: 100.7 fL (ref 79.5–101.0)
MONO#: 0.5 10*3/uL (ref 0.1–0.9)
MONO%: 10 % (ref 0.0–14.0)
NEUT#: 3.3 10*3/uL (ref 1.5–6.5)
NEUT%: 73.2 % (ref 38.4–76.8)
PLATELETS: 151 10*3/uL (ref 145–400)
RBC: 2.85 10*6/uL — ABNORMAL LOW (ref 3.70–5.45)
RDW: 15.8 % — ABNORMAL HIGH (ref 11.2–14.5)
WBC: 4.5 10*3/uL (ref 3.9–10.3)
lymph#: 0.7 10*3/uL — ABNORMAL LOW (ref 0.9–3.3)

## 2013-11-06 MED ORDER — CYANOCOBALAMIN 1000 MCG/ML IJ SOLN
1000.0000 ug | Freq: Once | INTRAMUSCULAR | Status: AC
  Administered 2013-11-06: 1000 ug via INTRAMUSCULAR

## 2013-11-06 MED ORDER — DARBEPOETIN ALFA-POLYSORBATE 500 MCG/ML IJ SOLN
300.0000 ug | Freq: Once | INTRAMUSCULAR | Status: AC
  Administered 2013-11-06: 300 ug via SUBCUTANEOUS
  Filled 2013-11-06: qty 1

## 2013-11-06 MED ORDER — HYDROCODONE-ACETAMINOPHEN 10-325 MG PO TABS: 1.0000 | ORAL_TABLET | ORAL | Status: DC | PRN

## 2013-11-08 ENCOUNTER — Emergency Department (HOSPITAL_COMMUNITY)
Admission: EM | Admit: 2013-11-08 | Discharge: 2013-11-08 | Disposition: A | Discharge status: Home / Self Care | Payer: Medicare Other | Attending: Emergency Medicine | Admitting: Emergency Medicine

## 2013-11-08 ENCOUNTER — Emergency Department (HOSPITAL_COMMUNITY): Payer: Medicare Other

## 2013-11-08 ENCOUNTER — Telehealth: Payer: Self-pay

## 2013-11-08 ENCOUNTER — Encounter (HOSPITAL_COMMUNITY): Payer: Self-pay | Admitting: Emergency Medicine

## 2013-11-08 ENCOUNTER — Encounter: Payer: Self-pay | Admitting: *Deleted

## 2013-11-08 DIAGNOSIS — Z951 Presence of aortocoronary bypass graft: Secondary | ICD-10-CM | POA: Insufficient documentation

## 2013-11-08 DIAGNOSIS — K219 Gastro-esophageal reflux disease without esophagitis: Secondary | ICD-10-CM | POA: Insufficient documentation

## 2013-11-08 DIAGNOSIS — Z85038 Personal history of other malignant neoplasm of large intestine: Secondary | ICD-10-CM | POA: Insufficient documentation

## 2013-11-08 DIAGNOSIS — IMO0002 Reserved for concepts with insufficient information to code with codable children: Secondary | ICD-10-CM | POA: Insufficient documentation

## 2013-11-08 DIAGNOSIS — E119 Type 2 diabetes mellitus without complications: Secondary | ICD-10-CM | POA: Insufficient documentation

## 2013-11-08 DIAGNOSIS — W19XXXA Unspecified fall, initial encounter: Secondary | ICD-10-CM

## 2013-11-08 DIAGNOSIS — I10 Essential (primary) hypertension: Secondary | ICD-10-CM | POA: Insufficient documentation

## 2013-11-08 DIAGNOSIS — I252 Old myocardial infarction: Secondary | ICD-10-CM | POA: Insufficient documentation

## 2013-11-08 DIAGNOSIS — W1809XA Striking against other object with subsequent fall, initial encounter: Secondary | ICD-10-CM | POA: Insufficient documentation

## 2013-11-08 DIAGNOSIS — S72109A Unspecified trochanteric fracture of unspecified femur, initial encounter for closed fracture: Secondary | ICD-10-CM | POA: Insufficient documentation

## 2013-11-08 DIAGNOSIS — S0990XA Unspecified injury of head, initial encounter: Secondary | ICD-10-CM | POA: Insufficient documentation

## 2013-11-08 DIAGNOSIS — Y9389 Activity, other specified: Secondary | ICD-10-CM | POA: Insufficient documentation

## 2013-11-08 DIAGNOSIS — M810 Age-related osteoporosis without current pathological fracture: Secondary | ICD-10-CM | POA: Insufficient documentation

## 2013-11-08 DIAGNOSIS — N390 Urinary tract infection, site not specified: Secondary | ICD-10-CM | POA: Insufficient documentation

## 2013-11-08 DIAGNOSIS — Z79899 Other long term (current) drug therapy: Secondary | ICD-10-CM | POA: Insufficient documentation

## 2013-11-08 DIAGNOSIS — I2510 Atherosclerotic heart disease of native coronary artery without angina pectoris: Secondary | ICD-10-CM | POA: Insufficient documentation

## 2013-11-08 DIAGNOSIS — Y929 Unspecified place or not applicable: Secondary | ICD-10-CM | POA: Insufficient documentation

## 2013-11-08 DIAGNOSIS — S72101A Unspecified trochanteric fracture of right femur, initial encounter for closed fracture: Secondary | ICD-10-CM

## 2013-11-08 DIAGNOSIS — Z862 Personal history of diseases of the blood and blood-forming organs and certain disorders involving the immune mechanism: Secondary | ICD-10-CM | POA: Insufficient documentation

## 2013-11-08 LAB — URINE MICROSCOPIC-ADD ON

## 2013-11-08 LAB — UIFE/LIGHT CHAINS/TP QN, 24-HR UR
ALBUMIN, U: DETECTED
ALPHA 1 UR: DETECTED — AB
Alpha 2, Urine: DETECTED — AB
BETA UR: DETECTED — AB
FREE LAMBDA EXCRETION/DAY: 6.84 mg/d
Free Kappa Lt Chains,Ur: 66.4 mg/dL — ABNORMAL HIGH (ref 0.14–2.42)
Free Kappa/Lambda Ratio: 116.49 ratio — ABNORMAL HIGH (ref 2.04–10.37)
Free Lambda Lt Chains,Ur: 0.57 mg/dL (ref 0.02–0.67)
Free Lt Chn Excr Rate: 796.8 mg/d
Gamma Globulin, Urine: DETECTED — AB
Time: 24 hours
Total Protein, Urine-Ur/day: 838 mg/d — ABNORMAL HIGH (ref 10–140)
Total Protein, Urine: 69.8 mg/dL
Volume, Urine: 1200 mL

## 2013-11-08 LAB — URINALYSIS, ROUTINE W REFLEX MICROSCOPIC
Bilirubin Urine: NEGATIVE
Glucose, UA: NEGATIVE mg/dL
Hgb urine dipstick: NEGATIVE
KETONES UR: NEGATIVE mg/dL
NITRITE: NEGATIVE
Protein, ur: NEGATIVE mg/dL
SPECIFIC GRAVITY, URINE: 1.007 (ref 1.005–1.030)
UROBILINOGEN UA: 0.2 mg/dL (ref 0.0–1.0)
pH: 6.5 (ref 5.0–8.0)

## 2013-11-08 MED ORDER — ACETAMINOPHEN 325 MG PO TABS: 650.0000 mg | ORAL_TABLET | Freq: Once | ORAL | Status: DC

## 2013-11-08 MED ORDER — HYDROCODONE-ACETAMINOPHEN 5-325 MG PO TABS
1.0000 | ORAL_TABLET | Freq: Once | ORAL | Status: AC
  Administered 2013-11-08: 1 via ORAL
  Filled 2013-11-08: qty 1

## 2013-11-08 MED ORDER — CEPHALEXIN 500 MG PO CAPS: 500.0000 mg | ORAL_CAPSULE | Freq: Two times a day (BID) | ORAL | Status: DC

## 2013-11-08 MED ORDER — HYDROCODONE-ACETAMINOPHEN 5-325 MG PO TABS: 1.0000 | ORAL_TABLET | Freq: Four times a day (QID) | ORAL | Status: DC | PRN

## 2013-11-08 NOTE — ED Notes (Addendum)
Patient arrives via EMS due to c/o tripping and falling Denies hitting head to this nurse upon assessment  NO LOC  Patient denies taking blood thinners or other anticoagulants Patient with c/o right hip pain--no deformity or shortening of right LE noted Skin to right lower extremity warm, dry and color consistent with ethnicity 2+ dorsalis pedal pulse noted upon palpation Patient arrives alert and oriented x 4

## 2013-11-08 NOTE — ED Notes (Signed)
Bed: OV78 Expected date:  Expected time:  Means of arrival:  Comments: EMS/elderly fall-right hip pain

## 2013-11-08 NOTE — Discharge Instructions (Signed)
You may bear weight as tolerated.  Use a walker at all times.   Fall Prevention and Home Safety Falls cause injuries and can affect all age groups. It is possible to use preventive measures to significantly decrease the likelihood of falls. There are many simple measures which can make your home safer and prevent falls. OUTDOORS  Repair cracks and edges of walkways and driveways.  Remove high doorway thresholds.  Trim shrubbery on the main path into your home.  Have good outside lighting.  Clear walkways of tools, rocks, debris, and clutter.  Check that handrails are not broken and are securely fastened. Both sides of steps should have handrails.  Have leaves, snow, and ice cleared regularly.  Use sand or salt on walkways during winter months.  In the garage, clean up grease or oil spills. BATHROOM  Install night lights.  Install grab bars by the toilet and in the tub and shower.  Use non-skid mats or decals in the tub or shower.  Place a plastic non-slip stool in the shower to sit on, if needed.  Keep floors dry and clean up all water on the floor immediately.  Remove soap buildup in the tub or shower on a regular basis.  Secure bath mats with non-slip, double-sided rug tape.  Remove throw rugs and tripping hazards from the floors. BEDROOMS  Install night lights.  Make sure a bedside light is easy to reach.  Do not use oversized bedding.  Keep a telephone by your bedside.  Have a firm chair with side arms to use for getting dressed.  Remove throw rugs and tripping hazards from the floor. KITCHEN  Keep handles on pots and pans turned toward the center of the stove. Use back burners when possible.  Clean up spills quickly and allow time for drying.  Avoid walking on wet floors.  Avoid hot utensils and knives.  Position shelves so they are not too high or low.  Place commonly used objects within easy reach.  If necessary, use a sturdy step stool with a  grab bar when reaching.  Keep electrical cables out of the way.  Do not use floor polish or wax that makes floors slippery. If you must use wax, use non-skid floor wax.  Remove throw rugs and tripping hazards from the floor. STAIRWAYS  Never leave objects on stairs.  Place handrails on both sides of stairways and use them. Fix any loose handrails. Make sure handrails on both sides of the stairways are as long as the stairs.  Check carpeting to make sure it is firmly attached along stairs. Make repairs to worn or loose carpet promptly.  Avoid placing throw rugs at the top or bottom of stairways, or properly secure the rug with carpet tape to prevent slippage. Get rid of throw rugs, if possible.  Have an electrician put in a light switch at the top and bottom of the stairs. OTHER FALL PREVENTION TIPS  Wear low-heel or rubber-soled shoes that are supportive and fit well. Wear closed toe shoes.  When using a stepladder, make sure it is fully opened and both spreaders are firmly locked. Do not climb a closed stepladder.  Add color or contrast paint or tape to grab bars and handrails in your home. Place contrasting color strips on first and last steps.  Learn and use mobility aids as needed. Install an electrical emergency response system.  Turn on lights to avoid dark areas. Replace light bulbs that burn out immediately. Get light switches  that glow.  Arrange furniture to create clear pathways. Keep furniture in the same place.  Firmly attach carpet with non-skid or double-sided tape.  Eliminate uneven floor surfaces.  Select a carpet pattern that does not visually hide the edge of steps.  Be aware of all pets. OTHER HOME SAFETY TIPS  Set the water temperature for 120 F (48.8 C).  Keep emergency numbers on or near the telephone.  Keep smoke detectors on every level of the home and near sleeping areas. Document Released: 04/22/2002 Document Revised: 11/01/2011 Document  Reviewed: 07/22/2011 [facility] Patient Information 2015 [place], [place]. This information is not intended to replace advice given to you by your health care provider. Make sure you discuss any questions you have with your health care provider.   Hip Fracture A hip fracture is a fracture of the upper part of your thigh bone (femur).  CAUSES A hip fracture is caused by a direct blow to the side of your hip. This is usually the result of a fall but can occur in other circumstances, such as an automobile accident. RISK FACTORS There is an increased risk of hip fractures in people with:  An unsteady walking pattern (gait) and those with conditions that contribute to poor balance, such as Parkinson's disease or dementia.  Osteopenia and osteoporosis.  Cancer that spreads to the leg bones.  Certain metabolic diseases. SYMPTOMS  Symptoms of hip fracture include:  Pain over the injured hip.  Inability to put weight on the leg in which the fracture occurred (although, some patients are able to walk after a hip fracture).  Toes and foot of the affected leg point outward when you lie down. DIAGNOSIS A physical exam can determine if a hip fracture is likely to have occurred. X-ray exams are needed to confirm the fracture and to look for other injuries. The X-ray exam can help to determine the type of hip fracture. Rarely, the fracture is not visible on an X-ray image and a CT scan or MRI will have to be done. TREATMENT  The treatment for a fracture is sometimes surgery. This means using a screw, nail, or rod to hold the bones in place.  HOME CARE INSTRUCTIONS Take all medicines as directed by your health care provider. SEEK MEDICAL CARE IF: Pain continues, even after taking pain medicine. MAKE SURE YOU:  Understand these instructions.   Will watch your condition.  Will get help right away if you are not doing well or get worse. Document Released: 05/02/2005 Document Revised: 05/07/2013  Document Reviewed: 12/12/2012 [facility] Patient Information 2015 [place], [place]. This information is not intended to replace advice given to you by your health care provider. Make sure you discuss any questions you have with your health care provider.

## 2013-11-08 NOTE — ED Notes (Signed)
Dr. Doy Mince at bedside Patient and pt's family member aware of CT results and that a consult to ortho has been placed--agree and v/u to plan of care

## 2013-11-08 NOTE — ED Notes (Signed)
Patient transported to X-ray via stretcher Patient in NAD upon leaving for testing 

## 2013-11-08 NOTE — ED Provider Notes (Signed)
CSN: [id number]     Arrival date & time 11/08/13  0039 History   First MD Initiated Contact with Patient 11/08/13 0319     Chief Complaint  Patient presents with  . Fall     (Consider location/radiation/quality/duration/timing/severity/associated sxs/prior Treatment) HPI Comments: 80 year old female with a history of multiple myeloma presenting with right hip pain after a fall. Fall described as mechanical as she was trying to go up a single step. She fell on her right side, but also struck the right side of her head as she was going down. No loss of consciousness. No neck pain. Denies other injuries. No chest pain or shortness of breath or abdominal pain. Pain in right hip described as severe, constant, worse with movement, better with rest.  Patient is a 80 y.o. female presenting with fall.  Fall This is a new problem. Episode onset: just prior to arrival. Episode frequency: once. The problem has been resolved. Pertinent negatives include no chest pain, no abdominal pain and no shortness of breath. Associated symptoms comments: Right hip pain.  . The symptoms are aggravated by standing (movement). Nothing relieves the symptoms.    Past Medical History  Diagnosis Date  . Hypertension   . Diabetes mellitus   . Anemia   . Osteoporosis   . Dyslipidemia   . Myocardial infarction   . colon ca dx'd 2003    surg only  . Multiple myeloma dx'd 2009    oral chemo ongoing  . Coronary artery disease   . Shortness of breath   . GERD (gastroesophageal reflux disease)   . Wears glasses    Past Surgical History  Procedure Laterality Date  . Coronary artery bypass graft  08/2001    4 vessel  . Colon surgery  2003  . Tonsillectomy    . Appendectomy    . Abdominal hysterectomy    . Back surgery    . Eye surgery      cataracts  . Direct laryngoscopy with radiaesse injection Left 08/13/2012    Procedure: LEFT VOCAL CORD RADIESSE AUGMENTATION LARYNGOSCOPY ;  Surgeon: Jodi Marble, MD;   Location: [facility];  Service: ENT;  Laterality: Left;  . Colonoscopy N/A 06/13/2013    Procedure: COLONOSCOPY;  Surgeon: Jeryl Columbia, MD;  Location: [facility];  Service: Endoscopy;  Laterality: N/A;   Family History  Problem Relation Age of Onset  . Heart disease Brother   . Diabetes Brother    History  Substance Use Topics  . Smoking status: Never Smoker   . Smokeless tobacco: Never Used  . Alcohol Use: No   OB History   Grav Para Term Preterm Abortions TAB SAB Ect Mult Living                 Review of Systems  Constitutional: Negative for fever.  Respiratory: Negative for shortness of breath.   Cardiovascular: Negative for chest pain.  Gastrointestinal: Negative for nausea, vomiting, abdominal pain and diarrhea.  Genitourinary: Positive for frequency.  All other systems reviewed and are negative.     Allergies  Gemfibrozil; Nifedipine; and Questran  Home Medications   Prior to Admission medications   Medication Sig Start Date End Date Taking? Authorizing Provider  acyclovir (ZOVIRAX) 400 MG tablet Take 400 mg by mouth 2 (two) times daily.   Yes Historical Provider, MD  Alum & Mag Hydroxide-Simeth (MAGIC MOUTHWASH W/LIDOCAINE) SOLN Take 5 mLs by mouth 3 (three) times daily as needed for mouth pain. 10/30/13  Yes Concha Norway, MD  calcium carbonate (TUMS - DOSED IN MG ELEMENTAL CALCIUM) 500 MG chewable tablet Chew 1 tablet by mouth daily.   Yes Historical Provider, MD  carvedilol (COREG) 25 MG tablet Take 25 mg by mouth 2 (two) times daily with a meal.   Yes Historical Provider, MD  cholecalciferol (VITAMIN D) 1000 UNITS tablet Take 5,000 Units by mouth daily.   Yes Historical Provider, MD  cyanocobalamin (,VITAMIN B-12,) 1000 MCG/ML injection Inject 1,000 mcg into the muscle every 30 (thirty) days.   Yes Historical Provider, MD  cyclobenzaprine (FLEXERIL) 10 MG tablet Take 10 mg by mouth 3 (three) times daily as needed for muscle spasms.   Yes Historical  Provider, MD  dexamethasone (DECADRON) 4 MG tablet Take 20 mg by mouth once a week. On Wednesdays   Yes Historical Provider, MD  HYDROcodone-acetaminophen (NORCO) 10-325 MG per tablet Take 1 tablet by mouth every 6 (six) hours as needed for moderate pain.    Yes Historical Provider, MD  HYDROcodone-acetaminophen (NORCO) 10-325 MG per tablet Take 1 tablet by mouth every 6 (six) hours as needed for moderate pain. Take one tablet by mouth every 6 hours as needed for pain 11/06/13  Yes Concha Norway, MD  lisinopril (PRINIVIL,ZESTRIL) 2.5 MG tablet Take 2.5 mg by mouth every other day. 05/06/12  Yes Barton Dubois, MD  loperamide (IMODIUM) 2 MG capsule Take 2 mg by mouth 3 (three) times daily as needed for diarrhea or loose stools.    Yes Historical Provider, MD  magnesium oxide (MAG-OX) 400 MG tablet Take 1 tablet (400 mg total) by mouth 2 (two) times daily. 08/14/13  Yes Adrena E Johnson, PA-C  metFORMIN (GLUCOPHAGE) 500 MG tablet Take 500 mg by mouth 2 (two) times daily with a meal.     Yes Historical Provider, MD  Multiple Vitamin (MULTIVITAMIN WITH MINERALS) TABS Take 1 tablet by mouth daily.   Yes Historical Provider, MD  pantoprazole (PROTONIX) 40 MG tablet Take 40 mg by mouth daily.     Yes Historical Provider, MD  pomalidomide (POMALYST) 2 MG capsule Take 1 capsule (2 mg total) by mouth daily. Take with water on days 1-21. Repeat every 28 days. 10/23/13  Yes Concha Norway, MD  prochlorperazine (COMPAZINE) 10 MG tablet Take 10 mg by mouth every 6 (six) hours as needed for nausea or vomiting.    Historical Provider, MD   BP 145/79  Pulse 89  Temp(Src) 98 F (36.7 C) (Oral)  Resp 18  SpO2 96% Physical Exam  Nursing note and vitals reviewed. Constitutional: She is oriented to person, place, and time. She appears well-developed and well-nourished. No distress.  HENT:  Head: Normocephalic and atraumatic. Head is without raccoon's eyes and without Battle's sign.  Nose: Nose normal.  Mouth/Throat:  Oropharynx is clear and moist.  Eyes: Conjunctivae and EOM are normal. Pupils are equal, round, and reactive to light. No scleral icterus.  Neck: Normal range of motion. Neck supple. No spinous process tenderness and no muscular tenderness present.  Cardiovascular: Normal rate, regular rhythm, normal heart sounds and intact distal pulses.   No murmur heard. Pulmonary/Chest: Effort normal and breath sounds normal. No stridor. No respiratory distress. She has no rales. She exhibits no tenderness.  Abdominal: Soft. Bowel sounds are normal. She exhibits no distension. There is no tenderness. There is no rebound and no guarding.  Musculoskeletal: Normal range of motion. She exhibits no edema.       Right hip: She exhibits bony tenderness. She  exhibits normal range of motion (pain with range of motion), no swelling, no crepitus and no deformity.       Thoracic back: She exhibits no tenderness and no bony tenderness.       Lumbar back: She exhibits tenderness and bony tenderness (mild).  No evidence of trauma to extremities, except as noted.  2+ distal pulses.    Neurological: She is alert and oriented to person, place, and time.  Skin: Skin is warm and dry. No rash noted.  Psychiatric: She has a normal mood and affect. Her behavior is normal.    ED Course  Procedures (including critical care time) Labs Review Labs Reviewed  URINALYSIS, ROUTINE W REFLEX MICROSCOPIC - Abnormal; Notable for the following:    APPearance CLOUDY (*)    Leukocytes, UA MODERATE (*)    All other components within normal limits  URINE MICROSCOPIC-ADD ON - Abnormal; Notable for the following:    Bacteria, UA FEW (*)    All other components within normal limits    Imaging Review Dg Lumbar Spine 2-3 Views  11/08/2013   CLINICAL DATA:  Fall.  Back pain.  EXAM: LUMBAR SPINE - 2-3 VIEW  COMPARISON:  Multiple previous examinations including CT abdomen 06/06/2013 sagittal reconstructions  FINDINGS: There has been previous  vertebral augmentation at T11, T12 and L5. There are old partial compression fractures at T10, L1 and the inferior endplate of L3. These are not visibly change. No acute fractures seen. There is degenerative disc disease throughout the region and there is degenerative facet disease in the lower lumbar spine. Atherosclerotic disease of the aorta and its branch vessels is noted.  IMPRESSION: No acute fracture seen. Previously augmented fractures at T11, T12 and L5. Old fractures at T10, L1 and L3, unchanged since the previous exams.   Electronically Signed   By: Nelson Chimes M.D.   On: 11/08/2013 07:11   Dg Hip Complete Right  11/08/2013   CLINICAL DATA:  Patient fell this morning with anterior hip pain on movement. Right patellar pain.  EXAM: RIGHT HIP - COMPLETE 2+ VIEW  COMPARISON:  None.  FINDINGS: Degenerative changes in the lower lumbar spine and both hips. Kyphoplasty changes at L5. Diffuse vascular calcifications. Pelvic rim appears intact. SI joints and symphysis pubis are not displaced. Right hip appears intact without evidence of acute fracture or dislocation.  IMPRESSION: Degenerative changes in the lower lumbar spine and hips. No acute bony abnormalities appreciated.   Electronically Signed   By: Lucienne Capers M.D.   On: 11/08/2013 02:11   Dg Knee 2 Views Right  11/08/2013   CLINICAL DATA:  Right patellar pain after a fall this morning.  EXAM: RIGHT KNEE - 1-2 VIEW  COMPARISON:  None.  FINDINGS: Tricompartment degenerative changes in the right knee. Chondrocalcinosis. Extensive vascular calcifications. Nonspecific soft tissue calcification in the distal aspect of the thigh probably representing dystrophic muscular calcification or venous malformation. No evidence of acute fracture or dislocation of the right knee. No significant effusion.  IMPRESSION: Degenerative changes in the right knee. No acute bony abnormalities suggested.   Electronically Signed   By: Lucienne Capers M.D.   On: 11/08/2013  02:12   Ct Head Wo Contrast  11/08/2013   CLINICAL DATA:  Wound fell, striking right-sided head. History of multiple myeloma.  EXAM: CT HEAD WITHOUT CONTRAST  CT CERVICAL SPINE WITHOUT CONTRAST  TECHNIQUE: Multidetector CT imaging of the head and cervical spine was performed following the standard protocol without intravenous contrast. Multiplanar  CT image reconstructions of the cervical spine were also generated.  COMPARISON:  MRI brain 07/15/2012. CT neck 07/12/2012. CT head 05/04/2012.  FINDINGS: CT HEAD FINDINGS  Minimal cerebral atrophy. No ventricular dilatation. Mild patchy white matter changes. No mass effect or midline shift. No abnormal extra-axial fluid collections. Gray-white matter junctions are distinct. Basal cisterns are not effaced. No evidence of acute intracranial hemorrhage. No depressed skull fractures. Visualized paranasal sinuses and mastoid air cells are not opacified. Subcutaneous soft tissue swelling over the right anterior frontal region and left parietal regions. Focal poorly defined lucency in the right anterior calvarium extending to the inner table likely representing changes of multiple myeloma. Diffuse demineralization of the skull. Vascular calcifications.  CT CERVICAL SPINE FINDINGS  Diffuse bone demineralization. Degenerative changes throughout the cervical spine with narrowed cervical interspaces and associated endplate hypertrophic changes. Mild retrolisthesis of C3 on C4 and mild anterolisthesis of C4 on C5 are likely degenerative. Alignment is unchanged since the prior study. Heterogeneous marrow signal intensity changes likely related to multiple myeloma. C1-2 articulation appears intact. No large destructive bone lesions are appreciated. No vertebral compression deformities. No prevertebral soft tissue swelling. Extensive vascular calcifications in the thoracic aorta.  IMPRESSION: No acute intracranial abnormalities. Poorly defined lucent lesion in the right calvarium  probably represents multiple myeloma change.  Diffuse bone demineralization with heterogeneous appearance possibly related to multiple myeloma. Degenerative changes. No displaced fractures identified.   Electronically Signed   By: Lucienne Capers M.D.   On: 11/08/2013 06:18   Ct Cervical Spine Wo Contrast  11/08/2013   CLINICAL DATA:  Wound fell, striking right-sided head. History of multiple myeloma.  EXAM: CT HEAD WITHOUT CONTRAST  CT CERVICAL SPINE WITHOUT CONTRAST  TECHNIQUE: Multidetector CT imaging of the head and cervical spine was performed following the standard protocol without intravenous contrast. Multiplanar CT image reconstructions of the cervical spine were also generated.  COMPARISON:  MRI brain 07/15/2012. CT neck 07/12/2012. CT head 05/04/2012.  FINDINGS: CT HEAD FINDINGS  Minimal cerebral atrophy. No ventricular dilatation. Mild patchy white matter changes. No mass effect or midline shift. No abnormal extra-axial fluid collections. Gray-white matter junctions are distinct. Basal cisterns are not effaced. No evidence of acute intracranial hemorrhage. No depressed skull fractures. Visualized paranasal sinuses and mastoid air cells are not opacified. Subcutaneous soft tissue swelling over the right anterior frontal region and left parietal regions. Focal poorly defined lucency in the right anterior calvarium extending to the inner table likely representing changes of multiple myeloma. Diffuse demineralization of the skull. Vascular calcifications.  CT CERVICAL SPINE FINDINGS  Diffuse bone demineralization. Degenerative changes throughout the cervical spine with narrowed cervical interspaces and associated endplate hypertrophic changes. Mild retrolisthesis of C3 on C4 and mild anterolisthesis of C4 on C5 are likely degenerative. Alignment is unchanged since the prior study. Heterogeneous marrow signal intensity changes likely related to multiple myeloma. C1-2 articulation appears intact. No large  destructive bone lesions are appreciated. No vertebral compression deformities. No prevertebral soft tissue swelling. Extensive vascular calcifications in the thoracic aorta.  IMPRESSION: No acute intracranial abnormalities. Poorly defined lucent lesion in the right calvarium probably represents multiple myeloma change.  Diffuse bone demineralization with heterogeneous appearance possibly related to multiple myeloma. Degenerative changes. No displaced fractures identified.   Electronically Signed   By: Lucienne Capers M.D.   On: 11/08/2013 06:18   Ct Hip Right Wo Contrast  11/08/2013   CLINICAL DATA:  [place] at midnight. Right hip pain. History of multiple myeloma.  EXAM:  CT OF THE RIGHT HIP WITHOUT CONTRAST  TECHNIQUE: Multidetector CT imaging was performed according to the standard protocol. Multiplanar CT image reconstructions were also generated.  COMPARISON:  06/06/2013  FINDINGS: There is cortical irregularity involving the greater femoral trochanter consistent with nondisplaced fracture. Femoral neck appears intact. No dislocation of the right hip. Degenerative changes are present in the right hip. Degenerative changes in the sacroiliac and symphysis pubis. Diffuse bone demineralization. Kyphoplasty changes at L5 vertebra. Incidental note of an anterior pelvic wall hernia extending to the right inguinal region an containing bowel. This was present on previous comparison study. Posterior right bladder wall diverticulum also present previously. Presumed injection granulomas in the subcutaneous soft tissues. Extensive vascular calcifications.  IMPRESSION: Nondisplaced fracture of the greater trochanter the right hip. Diffuse bone demineralization. Anterior pelvic wall/right inguinal hernia demonstrated.   Electronically Signed   By: Lucienne Capers M.D.   On: 11/08/2013 06:12  All radiology studies independently viewed by me.      EKG Interpretation None      MDM   Final diagnoses:  Fall, initial  encounter  Trochanteric fracture, right, closed, initial encounter  Urinary tract infection without hematuria, site unspecified    80 year old female presenting after a mechanical fall. Main complaint it right hip pain.  Plain films negative, but concern for occult hip fracture, so CT obtained.  It did demonstrate a nondisplaced fracture of the greater trochanter.  I discussed this with Dr. Ninfa Linden (Ortho) who recommended weight bearing as tolerated and close follow up in the clinic.    She also has pyuria with frequent urination and foul smelling urine.  Plan to treat for UTI with keflex.  Pain controlled with Norco (Pt normally tolerates this medication well.)    Houston Siren III, MD 11/08/13 [phone]

## 2013-11-08 NOTE — Progress Notes (Signed)
[employer] Work  Clinical Social Work was referred by Therapist, sports.  CSW contacted patient's daughter at home.  Patient's daughter reports patient fell at home and has fracture- she is currently main caregiver and needs home care support.  CSW provided [facility] with home care resources and discussed process for transitioning to an ALF.  CSW encouraged patient's daughter to call with any additional questions or concerns.  Polo Riley, MSW, LCSW, OSW-C Clinical Social Worker [facility] [phone]

## 2013-11-08 NOTE — Telephone Encounter (Signed)
Mary(daughter) called that Olesya fell last night. She has small fx in her R hip. She was sent home. [name] is looking for sitters so she can go to work. This information routed to Education officer, museum.

## 2013-11-08 NOTE — ED Notes (Signed)
Patient transported to X-ray 

## 2013-11-08 NOTE — ED Notes (Signed)
Patient back from radiology Patient in NAD at this time  Family at bedside, call bell in reach

## 2013-11-09 ENCOUNTER — Inpatient Hospital Stay (HOSPITAL_COMMUNITY)
Admission: EM | Admit: 2013-11-09 | Discharge: 2013-11-12 | DRG: 536 | Disposition: A | Discharge status: Skilled Nursing Facility | Payer: Medicare Other | Attending: Internal Medicine | Admitting: Internal Medicine

## 2013-11-09 ENCOUNTER — Emergency Department (HOSPITAL_COMMUNITY): Payer: Medicare Other

## 2013-11-09 ENCOUNTER — Encounter (HOSPITAL_COMMUNITY): Payer: Self-pay | Admitting: Emergency Medicine

## 2013-11-09 ENCOUNTER — Inpatient Hospital Stay (HOSPITAL_COMMUNITY): Payer: Medicare Other

## 2013-11-09 DIAGNOSIS — R0902 Hypoxemia: Secondary | ICD-10-CM | POA: Diagnosis present

## 2013-11-09 DIAGNOSIS — Z833 Family history of diabetes mellitus: Secondary | ICD-10-CM

## 2013-11-09 DIAGNOSIS — S72109A Unspecified trochanteric fracture of unspecified femur, initial encounter for closed fracture: Secondary | ICD-10-CM | POA: Diagnosis present

## 2013-11-09 DIAGNOSIS — W19XXXA Unspecified fall, initial encounter: Secondary | ICD-10-CM | POA: Diagnosis present

## 2013-11-09 DIAGNOSIS — I252 Old myocardial infarction: Secondary | ICD-10-CM

## 2013-11-09 DIAGNOSIS — E785 Hyperlipidemia, unspecified: Secondary | ICD-10-CM | POA: Diagnosis present

## 2013-11-09 DIAGNOSIS — M810 Age-related osteoporosis without current pathological fracture: Secondary | ICD-10-CM | POA: Diagnosis present

## 2013-11-09 DIAGNOSIS — Z8249 Family history of ischemic heart disease and other diseases of the circulatory system: Secondary | ICD-10-CM

## 2013-11-09 DIAGNOSIS — C9000 Multiple myeloma not having achieved remission: Secondary | ICD-10-CM | POA: Diagnosis present

## 2013-11-09 DIAGNOSIS — E871 Hypo-osmolality and hyponatremia: Secondary | ICD-10-CM | POA: Diagnosis present

## 2013-11-09 DIAGNOSIS — S72111A Displaced fracture of greater trochanter of right femur, initial encounter for closed fracture: Secondary | ICD-10-CM

## 2013-11-09 DIAGNOSIS — Z951 Presence of aortocoronary bypass graft: Secondary | ICD-10-CM

## 2013-11-09 DIAGNOSIS — W19XXXD Unspecified fall, subsequent encounter: Secondary | ICD-10-CM

## 2013-11-09 DIAGNOSIS — Z66 Do not resuscitate: Secondary | ICD-10-CM | POA: Diagnosis present

## 2013-11-09 DIAGNOSIS — Z85038 Personal history of other malignant neoplasm of large intestine: Secondary | ICD-10-CM | POA: Diagnosis not present

## 2013-11-09 DIAGNOSIS — E119 Type 2 diabetes mellitus without complications: Secondary | ICD-10-CM | POA: Diagnosis present

## 2013-11-09 DIAGNOSIS — Z79899 Other long term (current) drug therapy: Secondary | ICD-10-CM

## 2013-11-09 DIAGNOSIS — I2510 Atherosclerotic heart disease of native coronary artery without angina pectoris: Secondary | ICD-10-CM | POA: Diagnosis present

## 2013-11-09 DIAGNOSIS — S72111D Displaced fracture of greater trochanter of right femur, subsequent encounter for closed fracture with routine healing: Secondary | ICD-10-CM

## 2013-11-09 DIAGNOSIS — M25559 Pain in unspecified hip: Secondary | ICD-10-CM | POA: Diagnosis present

## 2013-11-09 DIAGNOSIS — D638 Anemia in other chronic diseases classified elsewhere: Secondary | ICD-10-CM

## 2013-11-09 DIAGNOSIS — K922 Gastrointestinal hemorrhage, unspecified: Secondary | ICD-10-CM

## 2013-11-09 DIAGNOSIS — S72009A Fracture of unspecified part of neck of unspecified femur, initial encounter for closed fracture: Secondary | ICD-10-CM

## 2013-11-09 DIAGNOSIS — I10 Essential (primary) hypertension: Secondary | ICD-10-CM | POA: Diagnosis present

## 2013-11-09 DIAGNOSIS — K219 Gastro-esophageal reflux disease without esophagitis: Secondary | ICD-10-CM | POA: Diagnosis present

## 2013-11-09 DIAGNOSIS — S72113A Displaced fracture of greater trochanter of unspecified femur, initial encounter for closed fracture: Secondary | ICD-10-CM

## 2013-11-09 DIAGNOSIS — E440 Moderate protein-calorie malnutrition: Secondary | ICD-10-CM

## 2013-11-09 LAB — BASIC METABOLIC PANEL
BUN: 18 mg/dL (ref 6–23)
CALCIUM: 9.1 mg/dL (ref 8.4–10.5)
CO2: 26 meq/L (ref 19–32)
Chloride: 90 mEq/L — ABNORMAL LOW (ref 96–112)
Creatinine, Ser: 0.7 mg/dL (ref 0.50–1.10)
GFR calc Af Amer: 88 mL/min — ABNORMAL LOW (ref >90)
GFR calc non Af Amer: 76 mL/min — ABNORMAL LOW (ref >90)
GLUCOSE: 157 mg/dL — AB (ref 70–99)
Potassium: 4 mEq/L (ref 3.7–5.3)
Sodium: 129 mEq/L — ABNORMAL LOW (ref 137–147)

## 2013-11-09 LAB — CBC
HEMATOCRIT: 27.7 % — AB (ref 36.0–46.0)
HEMOGLOBIN: 9.2 g/dL — AB (ref 12.0–15.0)
MCH: 32.2 pg (ref 26.0–34.0)
MCHC: 33.2 g/dL (ref 30.0–36.0)
MCV: 96.9 fL (ref 78.0–100.0)
PLATELETS: 181 10*3/uL (ref 150–400)
RBC: 2.86 MIL/uL — AB (ref 3.87–5.11)
RDW: 16.1 % — ABNORMAL HIGH (ref 11.5–15.5)
WBC: 7.6 10*3/uL (ref 4.0–10.5)

## 2013-11-09 LAB — SODIUM, URINE, RANDOM: Sodium, Ur: 48 mEq/L

## 2013-11-09 LAB — GLUCOSE, CAPILLARY: Glucose-Capillary: 180 mg/dL — ABNORMAL HIGH (ref 70–99)

## 2013-11-09 IMAGING — CR DG HIP COMPLETE 2+V*R*
3 series · 3 of 3 positions shown · non-contrast
Comparison: CT from the previous day

CLINICAL DATA: pain

EXAM:
RIGHT HIP - COMPLETE 2+ VIEW

[x pelvis]
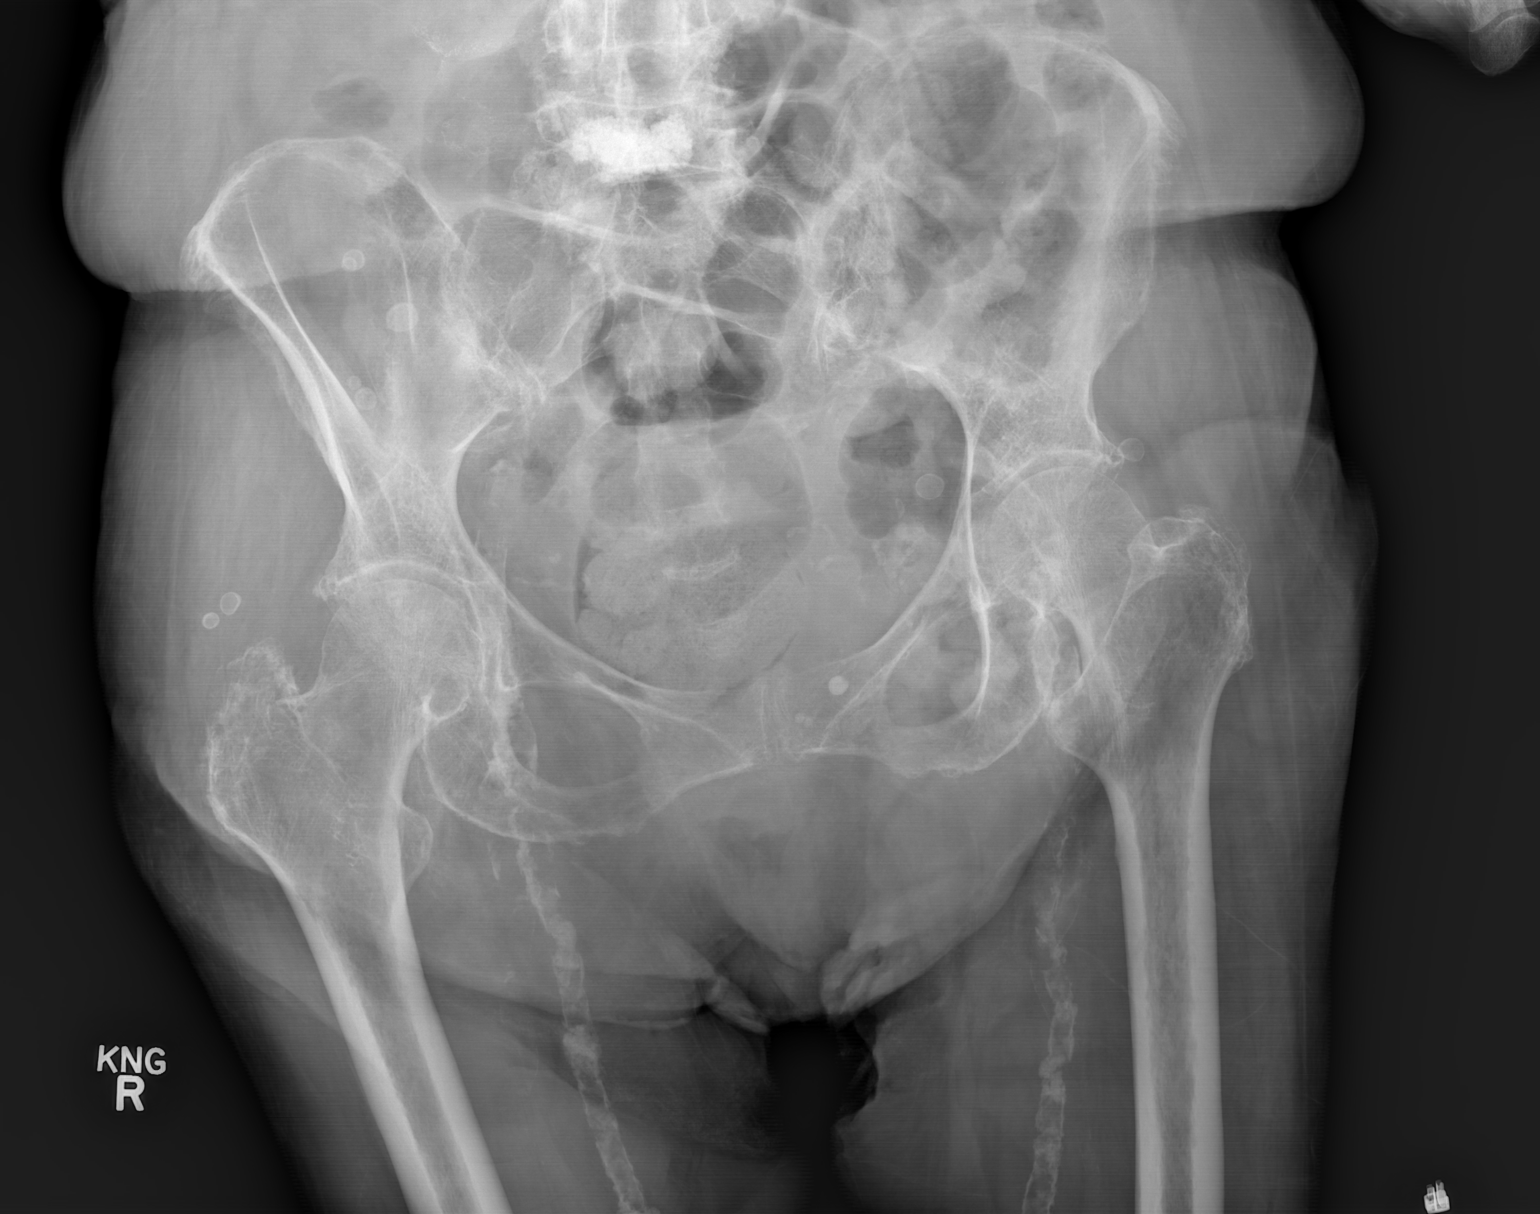

[x hip ap right]
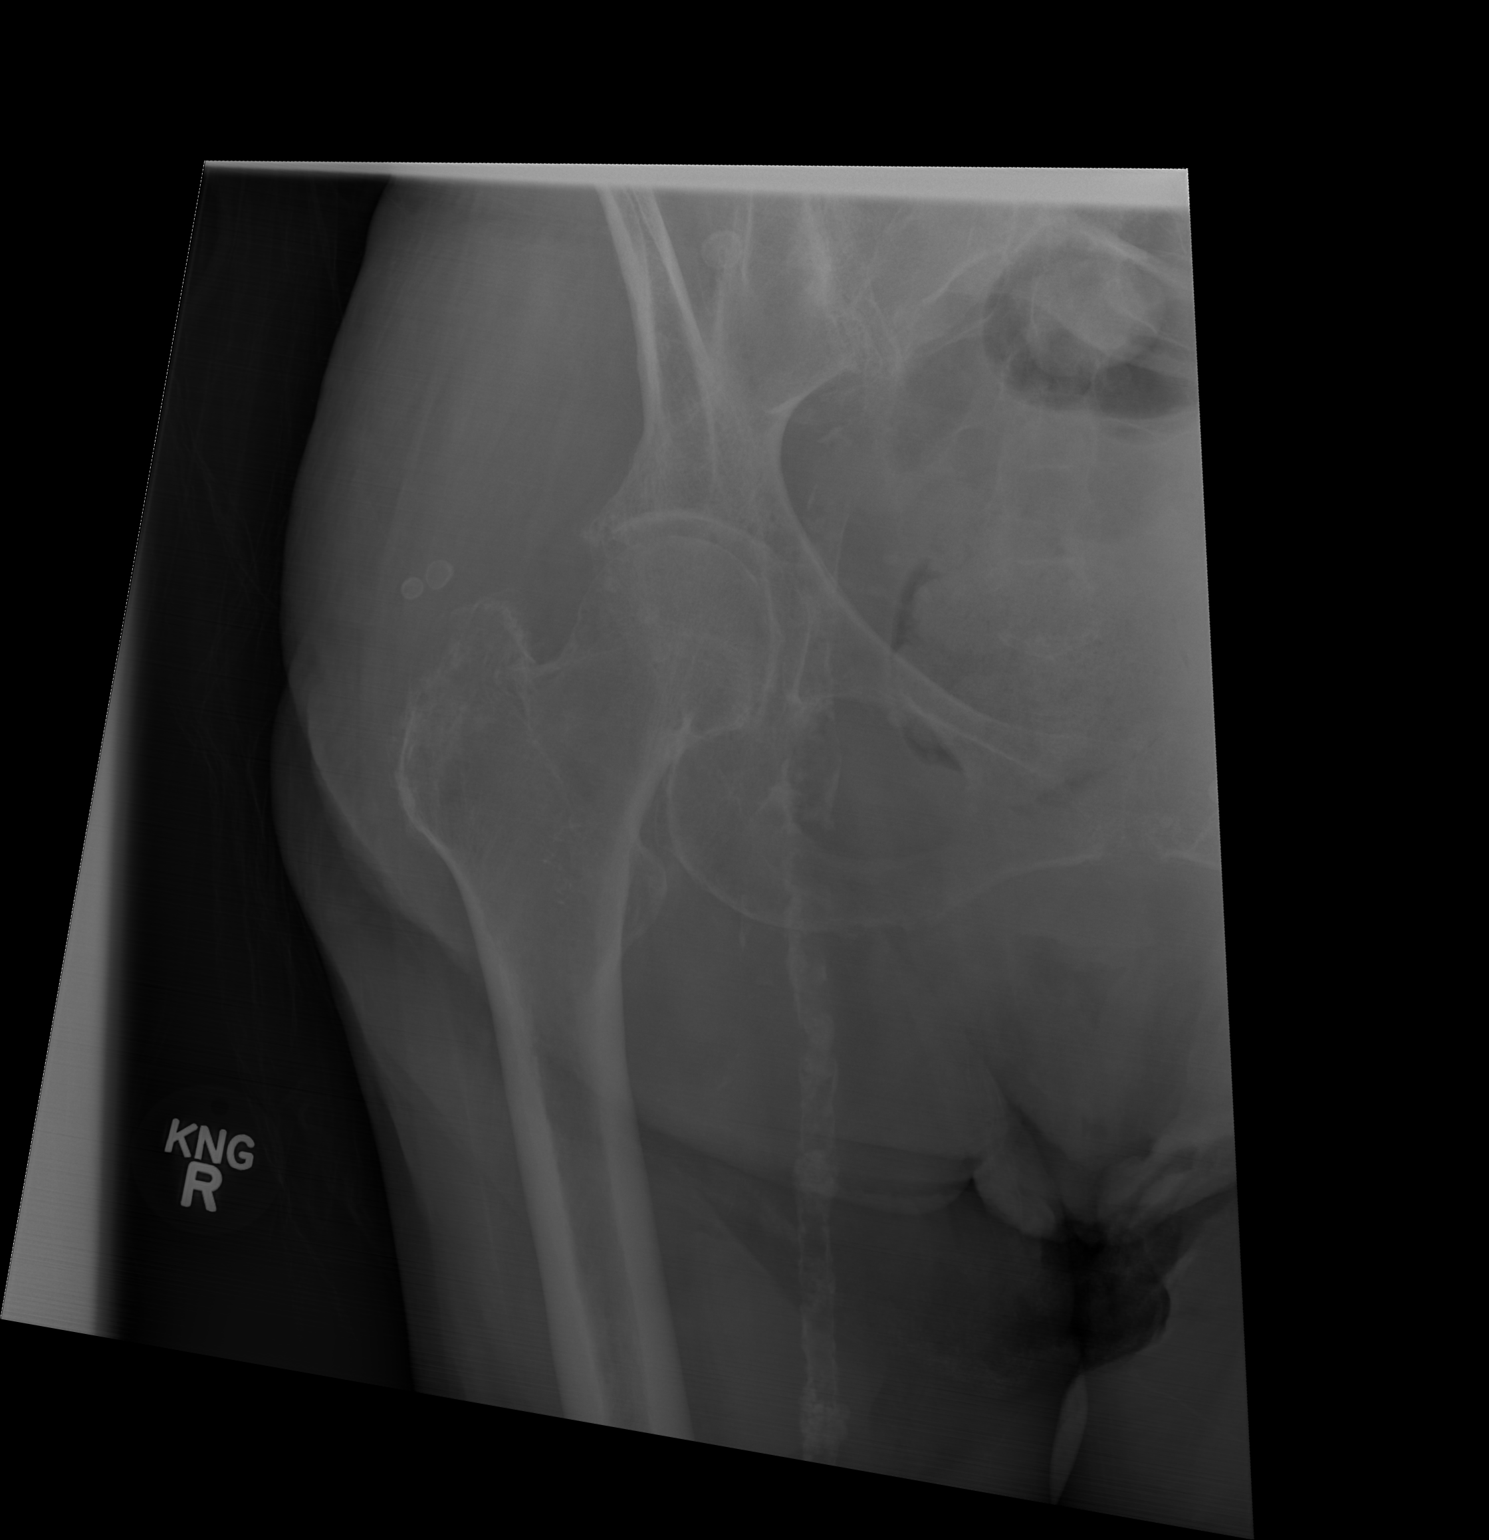

[w hip lat right]
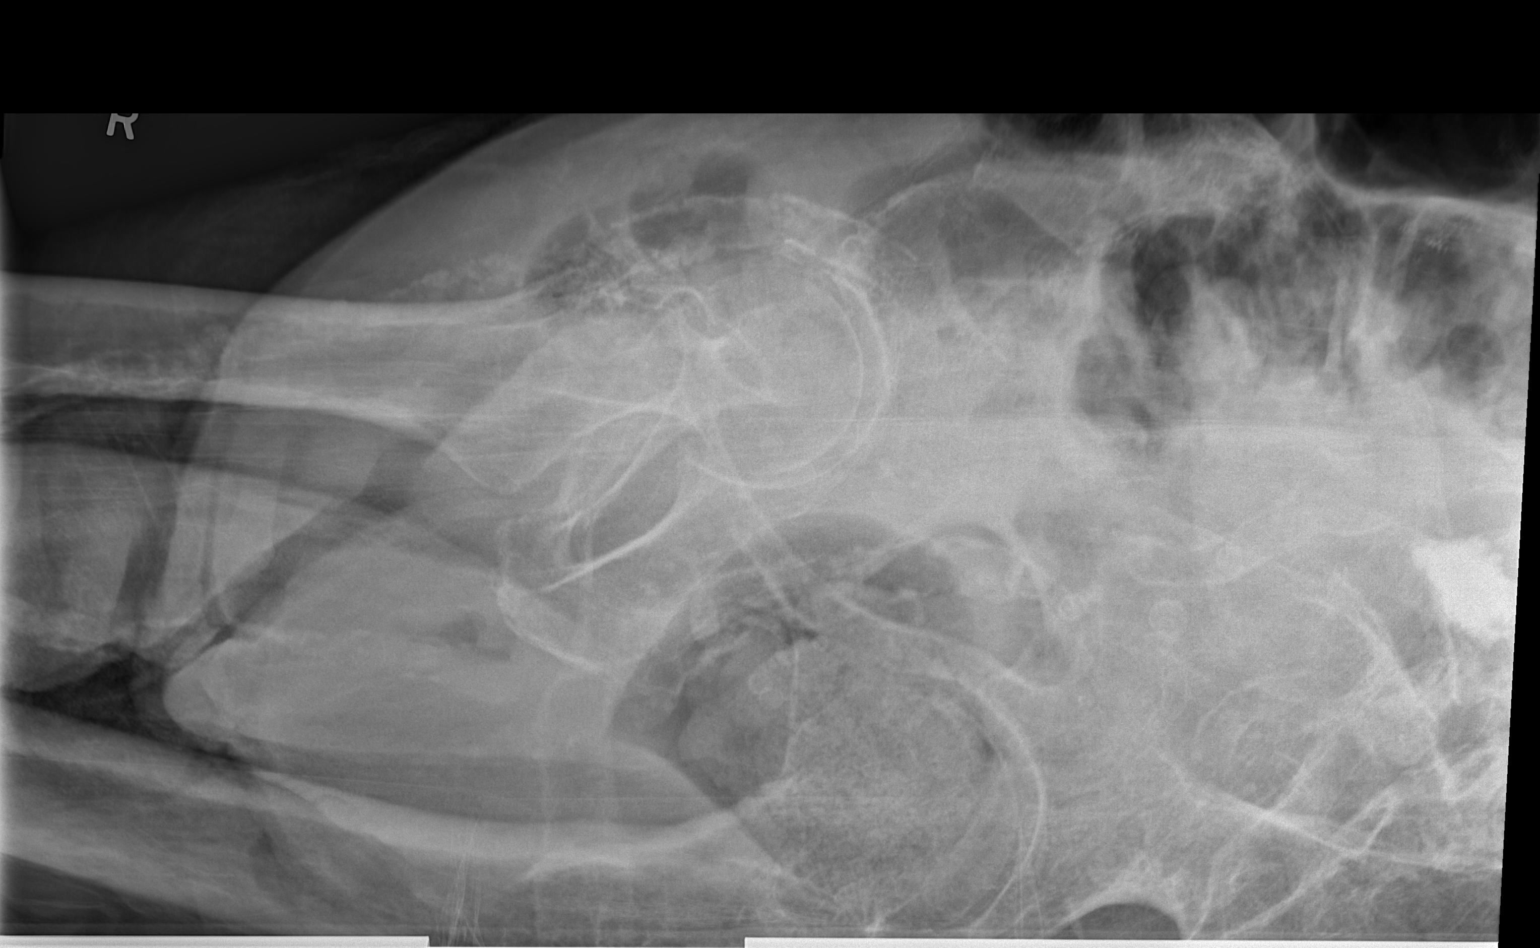

[3 of 3 positions shown; findings below may reference images not displayed]

FINDINGS: Minimally distracted avulsion fracture of the greater trochanter. No
definite extension across the femoral neck or head. No dislocation.
No other fracture or acute bone abnormality. Changes of kyphoplasty/
vertebroplasty in the lower lumbar spine. Extensive bilateral
iliofemoral arterial calcifications. Mild diffuse osteopenia.
IMPRESSION: 1. Little definite change in appearance of right greater trochanter
avulsion fracture.

## 2013-11-09 MED ORDER — PANTOPRAZOLE SODIUM 40 MG PO TBEC
40.0000 mg | DELAYED_RELEASE_TABLET | Freq: Every day | ORAL | Status: DC
  Administered 2013-11-10 – 2013-11-12 (×3): 40 mg via ORAL
  Filled 2013-11-09 (×3): qty 1

## 2013-11-09 MED ORDER — POMALIDOMIDE 2 MG PO CAPS: 2.0000 mg | ORAL_CAPSULE | Freq: Every day | ORAL | Status: DC

## 2013-11-09 MED ORDER — ACYCLOVIR 400 MG PO TABS
400.0000 mg | ORAL_TABLET | Freq: Two times a day (BID) | ORAL | Status: DC
  Administered 2013-11-09 – 2013-11-12 (×6): 400 mg via ORAL
  Filled 2013-11-09 (×7): qty 1

## 2013-11-09 MED ORDER — SODIUM CHLORIDE 0.9 % IV SOLN
INTRAVENOUS | Status: DC
  Administered 2013-11-09: 19:00:00 via INTRAVENOUS

## 2013-11-09 MED ORDER — MAGIC MOUTHWASH W/LIDOCAINE
5.0000 mL | Freq: Three times a day (TID) | ORAL | Status: DC | PRN
  Filled 2013-11-09: qty 5

## 2013-11-09 MED ORDER — POLYETHYL GLYCOL-PROPYL GLYCOL 0.4-0.3 % OP SOLN: 1.0000 [drp] | Freq: Three times a day (TID) | OPHTHALMIC | Status: DC | PRN

## 2013-11-09 MED ORDER — MORPHINE SULFATE 2 MG/ML IJ SOLN
1.0000 mg | INTRAMUSCULAR | Status: DC | PRN
  Administered 2013-11-10 (×2): 1 mg via INTRAVENOUS
  Filled 2013-11-09 (×2): qty 1

## 2013-11-09 MED ORDER — CARVEDILOL 25 MG PO TABS
25.0000 mg | ORAL_TABLET | Freq: Two times a day (BID) | ORAL | Status: DC
  Administered 2013-11-09 – 2013-11-12 (×6): 25 mg via ORAL
  Filled 2013-11-09 (×8): qty 1

## 2013-11-09 MED ORDER — CYANOCOBALAMIN 1000 MCG/ML IJ SOLN: 1000.0000 ug | INTRAMUSCULAR | Status: DC

## 2013-11-09 MED ORDER — LISINOPRIL 2.5 MG PO TABS
2.5000 mg | ORAL_TABLET | ORAL | Status: DC
  Administered 2013-11-09 – 2013-11-11 (×2): 2.5 mg via ORAL
  Filled 2013-11-09 (×2): qty 1

## 2013-11-09 MED ORDER — FENTANYL CITRATE 0.05 MG/ML IJ SOLN
50.0000 ug | Freq: Once | INTRAMUSCULAR | Status: AC
  Administered 2013-11-09: 50 ug via INTRAVENOUS
  Filled 2013-11-09: qty 2

## 2013-11-09 MED ORDER — ENOXAPARIN SODIUM 40 MG/0.4ML ~~LOC~~ SOLN
40.0000 mg | SUBCUTANEOUS | Status: DC
  Administered 2013-11-09 – 2013-11-11 (×3): 40 mg via SUBCUTANEOUS
  Filled 2013-11-09 (×5): qty 0.4

## 2013-11-09 MED ORDER — CALCIUM CARBONATE ANTACID 500 MG PO CHEW
1.0000 | CHEWABLE_TABLET | Freq: Every day | ORAL | Status: DC
  Administered 2013-11-09 – 2013-11-12 (×4): 200 mg via ORAL
  Filled 2013-11-09 (×4): qty 1

## 2013-11-09 MED ORDER — CYCLOBENZAPRINE HCL 10 MG PO TABS
10.0000 mg | ORAL_TABLET | Freq: Three times a day (TID) | ORAL | Status: DC | PRN
  Administered 2013-11-09 – 2013-11-12 (×3): 10 mg via ORAL
  Filled 2013-11-09 (×3): qty 1

## 2013-11-09 MED ORDER — ADULT MULTIVITAMIN W/MINERALS CH
1.0000 | ORAL_TABLET | Freq: Every day | ORAL | Status: DC
  Administered 2013-11-09 – 2013-11-12 (×4): 1 via ORAL
  Filled 2013-11-09 (×4): qty 1

## 2013-11-09 MED ORDER — POMALIDOMIDE 2 MG PO CAPS
2.0000 mg | ORAL_CAPSULE | Freq: Every day | ORAL | Status: DC
  Administered 2013-11-10 – 2013-11-12 (×3): 2 mg via ORAL

## 2013-11-09 MED ORDER — HYDROCODONE-ACETAMINOPHEN 10-325 MG PO TABS
1.0000 | ORAL_TABLET | ORAL | Status: DC | PRN
  Administered 2013-11-10 – 2013-11-11 (×6): 1 via ORAL
  Filled 2013-11-09 (×6): qty 1

## 2013-11-09 MED ORDER — DEXTROSE 5 % IV SOLN
1.0000 g | INTRAVENOUS | Status: DC
  Administered 2013-11-09 – 2013-11-11 (×3): 1 g via INTRAVENOUS
  Filled 2013-11-09 (×4): qty 10

## 2013-11-09 MED ORDER — POLYVINYL ALCOHOL 1.4 % OP SOLN
1.0000 [drp] | Freq: Three times a day (TID) | OPHTHALMIC | Status: DC | PRN
  Administered 2013-11-09: 1 [drp] via OPHTHALMIC
  Filled 2013-11-09: qty 15

## 2013-11-09 NOTE — Progress Notes (Signed)
Pt. Arrived to floor from ED. Alert and oriented x 4. Pt. Is crying stating she can't believe this happened. Denies complaints of pain at this time. No respiratory distress noted, O2 2L/[place].

## 2013-11-09 NOTE — ED Notes (Signed)
Pt reports continued pain and unable to bear weight to right hip fracture as a result from fall  Thursday. Pt was discharged from hospital yesterday morning.

## 2013-11-09 NOTE — ED Notes (Signed)
Pt O2 saturation with resting/laying in high 80s. Pt placed on 2lpm [place].

## 2013-11-09 NOTE — ED Notes (Signed)
Pt reports pain is much better

## 2013-11-09 NOTE — ED Provider Notes (Signed)
CSN: [id number]     Arrival date & time 11/09/13  1327 History   First MD Initiated Contact with Patient 11/09/13 1504     Chief Complaint  Patient presents with  . Hip Pain     HPI Patient presents emergency department after a fall 2 days ago.  She's seen the ER at that time and was found to have a right greater trochanteric fracture.  She was placed in a weightbearing as tolerated status.  Family reports the patient returned home his been unable to ambulate secondary to severe ongoing pain in the right hip.  This was diagnosed on CT scan of the right hip.  No new illness.  No fevers or chills.  No nausea vomiting or diarrhea.  Pain is not controlled at home with her to go down.   Past Medical History  Diagnosis Date  . Hypertension   . Diabetes mellitus   . Anemia   . Osteoporosis   . Dyslipidemia   . Myocardial infarction   . Coronary artery disease   . Shortness of breath   . GERD (gastroesophageal reflux disease)   . Wears glasses   . colon ca dx'd 2003    surg only  . Multiple myeloma dx'd 2009    oral chemo ongoing   Past Surgical History  Procedure Laterality Date  . Coronary artery bypass graft  08/2001    4 vessel  . Colon surgery  2003  . Tonsillectomy    . Appendectomy    . Abdominal hysterectomy    . Back surgery    . Eye surgery      cataracts  . Direct laryngoscopy with radiaesse injection Left 08/13/2012    Procedure: LEFT VOCAL CORD RADIESSE AUGMENTATION LARYNGOSCOPY ;  Surgeon: Jodi Marble, MD;  Location: [place];  Service: ENT;  Laterality: Left;  . Colonoscopy N/A 06/13/2013    Procedure: COLONOSCOPY;  Surgeon: Jeryl Columbia, MD;  Location: [facility];  Service: Endoscopy;  Laterality: N/A;   Family History  Problem Relation Age of Onset  . Heart disease Brother   . Diabetes Brother    History  Substance Use Topics  . Smoking status: Never Smoker   . Smokeless tobacco: Never Used  . Alcohol Use: No   OB History   Grav Para  Term Preterm Abortions TAB SAB Ect Mult Living                 Review of Systems  All other systems reviewed and are negative.     Allergies  Gemfibrozil; Nifedipine; and Questran  Home Medications   Prior to Admission medications   Medication Sig Start Date End Date Taking? Authorizing Provider  acyclovir (ZOVIRAX) 400 MG tablet Take 400 mg by mouth 2 (two) times daily.   Yes Historical Provider, MD  Alum & Mag Hydroxide-Simeth (MAGIC MOUTHWASH W/LIDOCAINE) SOLN Take 5 mLs by mouth 3 (three) times daily as needed for mouth pain. 10/30/13  Yes Concha Norway, MD  calcium carbonate (TUMS - DOSED IN MG ELEMENTAL CALCIUM) 500 MG chewable tablet Chew 1 tablet by mouth daily.   Yes Historical Provider, MD  Camphor-Menthol-Capsicum (TIGER BALM PAIN RELIEVING) 80-24-16 MG PTCH Apply 1 patch topically daily as needed (pain).   Yes Historical Provider, MD  carvedilol (COREG) 25 MG tablet Take 25 mg by mouth 2 (two) times daily with a meal.   Yes Historical Provider, MD  cephALEXin (KEFLEX) 500 MG capsule Take 1 capsule (500 mg total) by  mouth 2 (two) times daily. 11/08/13  Yes Houston Siren III, MD  cholecalciferol (VITAMIN D) 1000 UNITS tablet Take 5,000 Units by mouth daily.   Yes Historical Provider, MD  cyanocobalamin (,VITAMIN B-12,) 1000 MCG/ML injection Inject 1,000 mcg into the muscle every 30 (thirty) days.   Yes Historical Provider, MD  cyclobenzaprine (FLEXERIL) 10 MG tablet Take 10 mg by mouth 3 (three) times daily as needed for muscle spasms.   Yes Historical Provider, MD  dexamethasone (DECADRON) 4 MG tablet Take 20 mg by mouth once a week. On Wednesdays   Yes Historical Provider, MD  HYDROcodone-acetaminophen (NORCO) 10-325 MG per tablet Take 1 tablet by mouth every 6 (six) hours as needed for moderate pain.    Yes Historical Provider, MD  HYDROcodone-acetaminophen (NORCO/VICODIN) 5-325 MG per tablet Take 1 tablet by mouth every 6 (six) hours as needed. 11/08/13  Yes Houston Siren III, MD  lisinopril (PRINIVIL,ZESTRIL) 2.5 MG tablet Take 2.5 mg by mouth every other day. 05/06/12  Yes Barton Dubois, MD  loperamide (IMODIUM) 2 MG capsule Take 2 mg by mouth 3 (three) times daily as needed for diarrhea or loose stools.    Yes Historical Provider, MD  magnesium oxide (MAG-OX) 400 MG tablet Take 1 tablet (400 mg total) by mouth 2 (two) times daily. 08/14/13  Yes Adrena E Johnson, PA-C  metFORMIN (GLUCOPHAGE) 500 MG tablet Take 500 mg by mouth 2 (two) times daily with a meal.     Yes Historical Provider, MD  Multiple Vitamin (MULTIVITAMIN WITH MINERALS) TABS Take 1 tablet by mouth daily.   Yes Historical Provider, MD  pantoprazole (PROTONIX) 40 MG tablet Take 40 mg by mouth daily.     Yes Historical Provider, MD  Polyethyl Glycol-Propyl Glycol (SYSTANE) 0.4-0.3 % SOLN Place 1 drop into both eyes 3 (three) times daily as needed (dry eyes).   Yes Historical Provider, MD  pomalidomide (POMALYST) 2 MG capsule Take 1 capsule (2 mg total) by mouth daily. Take with water on days 1-21. Repeat every 28 days. 10/23/13  Yes Concha Norway, MD  prochlorperazine (COMPAZINE) 10 MG tablet Take 10 mg by mouth every 6 (six) hours as needed for nausea or vomiting.   Yes Historical Provider, MD   BP 116/61  Pulse 80  Temp(Src) 98.5 F (36.9 C) (Oral)  Resp 18  SpO2 95% Physical Exam  Nursing note and vitals reviewed. Constitutional: She is oriented to person, place, and time. She appears well-developed and well-nourished. No distress.  HENT:  Head: Normocephalic and atraumatic.  Eyes: EOM are normal.  Neck: Normal range of motion.  Cardiovascular: Normal rate, regular rhythm and normal heart sounds.   Pulmonary/Chest: Effort normal and breath sounds normal.  Abdominal: Soft. She exhibits no distension. There is no tenderness.  Musculoskeletal: Normal range of motion.  Pain with range of motion of right hip.  No obvious deformity of right hip.  Normal PT and DP pulses in right foot   Neurological: She is alert and oriented to person, place, and time.  Skin: Skin is warm and dry.  Psychiatric: She has a normal mood and affect. Judgment normal.    ED Course  Procedures (including critical care time) Labs Review Labs Reviewed  CBC - Abnormal; Notable for the following:    RBC 2.86 (*)    Hemoglobin 9.2 (*)    HCT 27.7 (*)    RDW 16.1 (*)    All other components within normal limits  BASIC METABOLIC PANEL - Abnormal;  Notable for the following:    Sodium 129 (*)    Chloride 90 (*)    Glucose, Bld 157 (*)    GFR calc non Af Amer 76 (*)    GFR calc Af Amer 88 (*)    All other components within normal limits  OSMOLALITY - Abnormal; Notable for the following:    Osmolality 274 (*)    All other components within normal limits  GLUCOSE, CAPILLARY - Abnormal; Notable for the following:    Glucose-Capillary 180 (*)    All other components within normal limits  URINE CULTURE  SODIUM, URINE, RANDOM  OSMOLALITY, URINE  TSH  CORTISOL  HEMOGLOBIN Q6S  BASIC METABOLIC PANEL    Imaging Review Dg Chest 2 View  11/09/2013   CLINICAL DATA:  Hypoxia. Chest pain after fall. History multiple myeloma.  EXAM: CHEST  2 VIEW  COMPARISON:  10/30/2013 bone survey.  Chest radiographs 09/23/2013.  FINDINGS: Sequelae of prior CABG are again identified. Cardiac silhouette appears mildly enlarged, unchanged. Thoracic aortic calcification is noted. Hiatal hernia is again noted. There is mild pulmonary vascular congestion. No pleural effusion or pneumothorax is identified. No segmental airspace consolidation is seen. The bones are osteopenic with prior vertebral cement augmentation again noted in the lower thoracic spine. Mild wedging of a few mid thoracic vertebral bodies is unchanged. Old left rib fractures are again seen.  IMPRESSION: Cardiomegaly and mild pulmonary vascular congestion.   Electronically Signed   By: Logan Bores   On: 11/09/2013 18:31   Dg Lumbar Spine 2-3  Views  11/08/2013   CLINICAL DATA:  Fall.  Back pain.  EXAM: LUMBAR SPINE - 2-3 VIEW  COMPARISON:  Multiple previous examinations including CT abdomen 06/06/2013 sagittal reconstructions  FINDINGS: There has been previous vertebral augmentation at T11, T12 and L5. There are old partial compression fractures at T10, L1 and the inferior endplate of L3. These are not visibly change. No acute fractures seen. There is degenerative disc disease throughout the region and there is degenerative facet disease in the lower lumbar spine. Atherosclerotic disease of the aorta and its branch vessels is noted.  IMPRESSION: No acute fracture seen. Previously augmented fractures at T11, T12 and L5. Old fractures at T10, L1 and L3, unchanged since the previous exams.   Electronically Signed   By: Nelson Chimes M.D.   On: 11/08/2013 07:11   Dg Hip Complete Right  11/09/2013   CLINICAL DATA:  pain  EXAM: RIGHT HIP - COMPLETE 2+ VIEW  COMPARISON:  CT from the previous day  FINDINGS: Minimally distracted avulsion fracture of the greater trochanter. No definite extension across the femoral neck or head. No dislocation. No other fracture or acute bone abnormality. Changes of kyphoplasty/ vertebroplasty in the lower lumbar spine. Extensive bilateral iliofemoral arterial calcifications. Mild diffuse osteopenia.  IMPRESSION: 1. Little definite change in appearance of right greater trochanter avulsion fracture.   Electronically Signed   By: Arne Cleveland M.D.   On: 11/09/2013 15:46   Dg Hip Complete Right  11/08/2013   CLINICAL DATA:  Patient fell this morning with anterior hip pain on movement. Right patellar pain.  EXAM: RIGHT HIP - COMPLETE 2+ VIEW  COMPARISON:  None.  FINDINGS: Degenerative changes in the lower lumbar spine and both hips. Kyphoplasty changes at L5. Diffuse vascular calcifications. Pelvic rim appears intact. SI joints and symphysis pubis are not displaced. Right hip appears intact without evidence of acute fracture  or dislocation.  IMPRESSION: Degenerative changes in the lower  lumbar spine and hips. No acute bony abnormalities appreciated.   Electronically Signed   By: Lucienne Capers M.D.   On: 11/08/2013 02:11   Dg Knee 2 Views Right  11/08/2013   CLINICAL DATA:  Right patellar pain after a fall this morning.  EXAM: RIGHT KNEE - 1-2 VIEW  COMPARISON:  None.  FINDINGS: Tricompartment degenerative changes in the right knee. Chondrocalcinosis. Extensive vascular calcifications. Nonspecific soft tissue calcification in the distal aspect of the thigh probably representing dystrophic muscular calcification or venous malformation. No evidence of acute fracture or dislocation of the right knee. No significant effusion.  IMPRESSION: Degenerative changes in the right knee. No acute bony abnormalities suggested.   Electronically Signed   By: Lucienne Capers M.D.   On: 11/08/2013 02:12   Ct Head Wo Contrast  11/08/2013   CLINICAL DATA:  Wound fell, striking right-sided head. History of multiple myeloma.  EXAM: CT HEAD WITHOUT CONTRAST  CT CERVICAL SPINE WITHOUT CONTRAST  TECHNIQUE: Multidetector CT imaging of the head and cervical spine was performed following the standard protocol without intravenous contrast. Multiplanar CT image reconstructions of the cervical spine were also generated.  COMPARISON:  MRI brain 07/15/2012. CT neck 07/12/2012. CT head 05/04/2012.  FINDINGS: CT HEAD FINDINGS  Minimal cerebral atrophy. No ventricular dilatation. Mild patchy white matter changes. No mass effect or midline shift. No abnormal extra-axial fluid collections. Gray-white matter junctions are distinct. Basal cisterns are not effaced. No evidence of acute intracranial hemorrhage. No depressed skull fractures. Visualized paranasal sinuses and mastoid air cells are not opacified. Subcutaneous soft tissue swelling over the right anterior frontal region and left parietal regions. Focal poorly defined lucency in the right anterior calvarium  extending to the inner table likely representing changes of multiple myeloma. Diffuse demineralization of the skull. Vascular calcifications.  CT CERVICAL SPINE FINDINGS  Diffuse bone demineralization. Degenerative changes throughout the cervical spine with narrowed cervical interspaces and associated endplate hypertrophic changes. Mild retrolisthesis of C3 on C4 and mild anterolisthesis of C4 on C5 are likely degenerative. Alignment is unchanged since the prior study. Heterogeneous marrow signal intensity changes likely related to multiple myeloma. C1-2 articulation appears intact. No large destructive bone lesions are appreciated. No vertebral compression deformities. No prevertebral soft tissue swelling. Extensive vascular calcifications in the thoracic aorta.  IMPRESSION: No acute intracranial abnormalities. Poorly defined lucent lesion in the right calvarium probably represents multiple myeloma change.  Diffuse bone demineralization with heterogeneous appearance possibly related to multiple myeloma. Degenerative changes. No displaced fractures identified.   Electronically Signed   By: Lucienne Capers M.D.   On: 11/08/2013 06:18   Ct Cervical Spine Wo Contrast  11/08/2013   CLINICAL DATA:  Wound fell, striking right-sided head. History of multiple myeloma.  EXAM: CT HEAD WITHOUT CONTRAST  CT CERVICAL SPINE WITHOUT CONTRAST  TECHNIQUE: Multidetector CT imaging of the head and cervical spine was performed following the standard protocol without intravenous contrast. Multiplanar CT image reconstructions of the cervical spine were also generated.  COMPARISON:  MRI brain 07/15/2012. CT neck 07/12/2012. CT head 05/04/2012.  FINDINGS: CT HEAD FINDINGS  Minimal cerebral atrophy. No ventricular dilatation. Mild patchy white matter changes. No mass effect or midline shift. No abnormal extra-axial fluid collections. Gray-white matter junctions are distinct. Basal cisterns are not effaced. No evidence of acute  intracranial hemorrhage. No depressed skull fractures. Visualized paranasal sinuses and mastoid air cells are not opacified. Subcutaneous soft tissue swelling over the right anterior frontal region and left parietal regions. Focal poorly  defined lucency in the right anterior calvarium extending to the inner table likely representing changes of multiple myeloma. Diffuse demineralization of the skull. Vascular calcifications.  CT CERVICAL SPINE FINDINGS  Diffuse bone demineralization. Degenerative changes throughout the cervical spine with narrowed cervical interspaces and associated endplate hypertrophic changes. Mild retrolisthesis of C3 on C4 and mild anterolisthesis of C4 on C5 are likely degenerative. Alignment is unchanged since the prior study. Heterogeneous marrow signal intensity changes likely related to multiple myeloma. C1-2 articulation appears intact. No large destructive bone lesions are appreciated. No vertebral compression deformities. No prevertebral soft tissue swelling. Extensive vascular calcifications in the thoracic aorta.  IMPRESSION: No acute intracranial abnormalities. Poorly defined lucent lesion in the right calvarium probably represents multiple myeloma change.  Diffuse bone demineralization with heterogeneous appearance possibly related to multiple myeloma. Degenerative changes. No displaced fractures identified.   Electronically Signed   By: Lucienne Capers M.D.   On: 11/08/2013 06:18   Ct Hip Right Wo Contrast  11/08/2013   CLINICAL DATA:  [place] at midnight. Right hip pain. History of multiple myeloma.  EXAM: CT OF THE RIGHT HIP WITHOUT CONTRAST  TECHNIQUE: Multidetector CT imaging was performed according to the standard protocol. Multiplanar CT image reconstructions were also generated.  COMPARISON:  06/06/2013  FINDINGS: There is cortical irregularity involving the greater femoral trochanter consistent with nondisplaced fracture. Femoral neck appears intact. No dislocation of the  right hip. Degenerative changes are present in the right hip. Degenerative changes in the sacroiliac and symphysis pubis. Diffuse bone demineralization. Kyphoplasty changes at L5 vertebra. Incidental note of an anterior pelvic wall hernia extending to the right inguinal region an containing bowel. This was present on previous comparison study. Posterior right bladder wall diverticulum also present previously. Presumed injection granulomas in the subcutaneous soft tissues. Extensive vascular calcifications.  IMPRESSION: Nondisplaced fracture of the greater trochanter the right hip. Diffuse bone demineralization. Anterior pelvic wall/right inguinal hernia demonstrated.   Electronically Signed   By: Lucienne Capers M.D.   On: 11/08/2013 06:12  I personally reviewed the imaging tests through PACS system I reviewed available ER/hospitalization records through the EMR    EKG Interpretation None      MDM   Final diagnoses:  Greater trochanter fracture, right, closed, initial encounter    Facial be admitted for pain control.    Hoy Morn, MD 11/10/13 0111

## 2013-11-09 NOTE — ED Notes (Signed)
Patient's daughter reports that the patient fell 2 days ago and was told that she had a small fracture and that she should be able to bear weight, but has been unable to do so. Patient's daughter states that she called PCP on call and was told to come to the ED for possible admission.

## 2013-11-09 NOTE — H&P (Signed)
[facility] History and Physical  Christina Hopkins:[id number] DOB: June 19, 1925 DOA: 11/09/2013  Referring physician: EDP PCP: [name], MD   Chief Complaint: came in for worsening hip pain.   HPI: Christina Hopkins is a 80 y.o. female with prior h/o multiple myeloma, hypertension, diabetes mellitus, had a recent fall few days ago, she was brought in by her daughter yesterday for unable to bear weight on her hip and feet. She was found to havea right greater trochanter avulsion fracture.  orthopedics consulted , Dr Renard Hamper man recommended weight bearing as tolerated and outpatient follow up  In 2 weeks. She was discharged home yesterday. But came  Back today as she could not bear the hip apin and her daughter could nt manage her at home. i could not get any hstory from the patient as she was sound asleep from the fentanyl she was given earlier. Most of the history was obtained fromt he daughter at bedside. She is admitted to medical service for further evaluation.    Review of Systems: could not be obtained.   Past Medical History  Diagnosis Date  . Hypertension   . Diabetes mellitus   . Anemia   . Osteoporosis   . Dyslipidemia   . Myocardial infarction   . Coronary artery disease   . Shortness of breath   . GERD (gastroesophageal reflux disease)   . Wears glasses   . colon ca dx'd 2003    surg only  . Multiple myeloma dx'd 2009    oral chemo ongoing   Past Surgical History  Procedure Laterality Date  . Coronary artery bypass graft  08/2001    4 vessel  . Colon surgery  2003  . Tonsillectomy    . Appendectomy    . Abdominal hysterectomy    . Back surgery    . Eye surgery      cataracts  . Direct laryngoscopy with radiaesse injection Left 08/13/2012    Procedure: LEFT VOCAL CORD RADIESSE AUGMENTATION LARYNGOSCOPY ;  Surgeon: Jodi Marble, MD;  [facility];  Service: ENT;  Laterality: Left;  . Colonoscopy N/A 06/13/2013    Procedure:  COLONOSCOPY;  Surgeon: Jeryl Columbia, MD;  Location: [facility];  Service: Endoscopy;  Laterality: N/A;   Social History:  reports that she has never smoked. She has never used smokeless tobacco. She reports that she does not drink alcohol or use illicit drugs.  Allergies  Allergen Reactions  . Gemfibrozil Nausea And Vomiting  . Nifedipine Hives  . Questran [Cholestyramine] Other (See Comments)    Do not remember    Family History  Problem Relation Age of Onset  . Heart disease Brother   . Diabetes Brother      Prior to Admission medications   Medication Sig Start Date End Date Taking? Authorizing Provider  acyclovir (ZOVIRAX) 400 MG tablet Take 400 mg by mouth 2 (two) times daily.   Yes Historical Provider, MD  Alum & Mag Hydroxide-Simeth (MAGIC MOUTHWASH W/LIDOCAINE) SOLN Take 5 mLs by mouth 3 (three) times daily as needed for mouth pain. 10/30/13  Yes Concha Norway, MD  calcium carbonate (TUMS - DOSED IN MG ELEMENTAL CALCIUM) 500 MG chewable tablet Chew 1 tablet by mouth daily.   Yes Historical Provider, MD  Camphor-Menthol-Capsicum (TIGER BALM PAIN RELIEVING) 80-24-16 MG PTCH Apply 1 patch topically daily as needed (pain).   Yes Historical Provider, MD  carvedilol (COREG) 25 MG tablet Take 25 mg by mouth 2 (two) times  daily with a meal.   Yes Historical Provider, MD  cephALEXin (KEFLEX) 500 MG capsule Take 1 capsule (500 mg total) by mouth 2 (two) times daily. 11/08/13  Yes Houston Siren III, MD  cholecalciferol (VITAMIN D) 1000 UNITS tablet Take 5,000 Units by mouth daily.   Yes Historical Provider, MD  cyanocobalamin (,VITAMIN B-12,) 1000 MCG/ML injection Inject 1,000 mcg into the muscle every 30 (thirty) days.   Yes Historical Provider, MD  cyclobenzaprine (FLEXERIL) 10 MG tablet Take 10 mg by mouth 3 (three) times daily as needed for muscle spasms.   Yes Historical Provider, MD  dexamethasone (DECADRON) 4 MG tablet Take 20 mg by mouth once a week. On Wednesdays   Yes Historical  Provider, MD  HYDROcodone-acetaminophen (NORCO) 10-325 MG per tablet Take 1 tablet by mouth every 6 (six) hours as needed for moderate pain.    Yes Historical Provider, MD  HYDROcodone-acetaminophen (NORCO/VICODIN) 5-325 MG per tablet Take 1 tablet by mouth every 6 (six) hours as needed. 11/08/13  Yes Houston Siren III, MD  lisinopril (PRINIVIL,ZESTRIL) 2.5 MG tablet Take 2.5 mg by mouth every other day. 05/06/12  Yes Barton Dubois, MD  loperamide (IMODIUM) 2 MG capsule Take 2 mg by mouth 3 (three) times daily as needed for diarrhea or loose stools.    Yes Historical Provider, MD  magnesium oxide (MAG-OX) 400 MG tablet Take 1 tablet (400 mg total) by mouth 2 (two) times daily. 08/14/13  Yes Adrena E Johnson, PA-C  metFORMIN (GLUCOPHAGE) 500 MG tablet Take 500 mg by mouth 2 (two) times daily with a meal.     Yes Historical Provider, MD  Multiple Vitamin (MULTIVITAMIN WITH MINERALS) TABS Take 1 tablet by mouth daily.   Yes Historical Provider, MD  pantoprazole (PROTONIX) 40 MG tablet Take 40 mg by mouth daily.     Yes Historical Provider, MD  Polyethyl Glycol-Propyl Glycol (SYSTANE) 0.4-0.3 % SOLN Place 1 drop into both eyes 3 (three) times daily as needed (dry eyes).   Yes Historical Provider, MD  pomalidomide (POMALYST) 2 MG capsule Take 1 capsule (2 mg total) by mouth daily. Take with water on days 1-21. Repeat every 28 days. 10/23/13  Yes Concha Norway, MD  prochlorperazine (COMPAZINE) 10 MG tablet Take 10 mg by mouth every 6 (six) hours as needed for nausea or vomiting.   Yes Historical Provider, MD   Physical Exam: Filed Vitals:   11/09/13 1659  BP: 134/61  Pulse: 89  Temp: 98.5 F (36.9 C)  Resp: 18    BP 134/61  Pulse 89  Temp(Src) 98.5 F (36.9 C) (Oral)  Resp 18  SpO2 96%  General:  Appears calm and comfortable Neck: no LAD, masses or thyromegaly Cardiovascular: RRR, no m/r/g. No LE edema. Telemetry: SR, no arrhythmias  Respiratory: CTA bilaterally, no w/r/r. Normal  respiratory effort. Abdomen: soft, ntnd Skin: no rash or induration seen on limited exam Musculoskeletal: painful ROM of the right lower extremity. Neurologic: grossly non-focal.          Labs on Admission:  Basic Metabolic Panel:  Recent Labs Lab 11/09/13 1516  NA 129*  K 4.0  CL 90*  CO2 26  GLUCOSE 157*  BUN 18  CREATININE 0.70  CALCIUM 9.1   Liver Function Tests: No results found for this basename: AST, ALT, ALKPHOS, BILITOT, PROT, ALBUMIN,  in the last 168 hours No results found for this basename: LIPASE, AMYLASE,  in the last 168 hours No results found for this basename: AMMONIA,  in the last 168 hours CBC:  Recent Labs Lab 11/06/13 0837 11/09/13 1516  WBC 4.5 7.6  NEUTROABS 3.3  --   HGB 9.1* 9.2*  HCT 28.7* 27.7*  MCV 100.7 96.9  PLT 151 181   Cardiac Enzymes: No results found for this basename: CKTOTAL, CKMB, CKMBINDEX, TROPONINI,  in the last 168 hours  BNP (last 3 results)  Recent Labs  03/23/13 1036  PROBNP 2180.0*   CBG: No results found for this basename: GLUCAP,  in the last 168 hours  Radiological Exams on Admission: Dg Lumbar Spine 2-3 Views  11/08/2013   CLINICAL DATA:  Fall.  Back pain.  EXAM: LUMBAR SPINE - 2-3 VIEW  COMPARISON:  Multiple previous examinations including CT abdomen 06/06/2013 sagittal reconstructions  FINDINGS: There has been previous vertebral augmentation at T11, T12 and L5. There are old partial compression fractures at T10, L1 and the inferior endplate of L3. These are not visibly change. No acute fractures seen. There is degenerative disc disease throughout the region and there is degenerative facet disease in the lower lumbar spine. Atherosclerotic disease of the aorta and its branch vessels is noted.  IMPRESSION: No acute fracture seen. Previously augmented fractures at T11, T12 and L5. Old fractures at T10, L1 and L3, unchanged since the previous exams.   Electronically Signed   By: Nelson Chimes M.D.   On: 11/08/2013  07:11   Dg Hip Complete Right  11/09/2013   CLINICAL DATA:  pain  EXAM: RIGHT HIP - COMPLETE 2+ VIEW  COMPARISON:  CT from the previous day  FINDINGS: Minimally distracted avulsion fracture of the greater trochanter. No definite extension across the femoral neck or head. No dislocation. No other fracture or acute bone abnormality. Changes of kyphoplasty/ vertebroplasty in the lower lumbar spine. Extensive bilateral iliofemoral arterial calcifications. Mild diffuse osteopenia.  IMPRESSION: 1. Little definite change in appearance of right greater trochanter avulsion fracture.   Electronically Signed   By: Arne Cleveland M.D.   On: 11/09/2013 15:46   Dg Hip Complete Right  11/08/2013   CLINICAL DATA:  Patient fell this morning with anterior hip pain on movement. Right patellar pain.  EXAM: RIGHT HIP - COMPLETE 2+ VIEW  COMPARISON:  None.  FINDINGS: Degenerative changes in the lower lumbar spine and both hips. Kyphoplasty changes at L5. Diffuse vascular calcifications. Pelvic rim appears intact. SI joints and symphysis pubis are not displaced. Right hip appears intact without evidence of acute fracture or dislocation.  IMPRESSION: Degenerative changes in the lower lumbar spine and hips. No acute bony abnormalities appreciated.   Electronically Signed   By: Lucienne Capers M.D.   On: 11/08/2013 02:11   Dg Knee 2 Views Right  11/08/2013   CLINICAL DATA:  Right patellar pain after a fall this morning.  EXAM: RIGHT KNEE - 1-2 VIEW  COMPARISON:  None.  FINDINGS: Tricompartment degenerative changes in the right knee. Chondrocalcinosis. Extensive vascular calcifications. Nonspecific soft tissue calcification in the distal aspect of the thigh probably representing dystrophic muscular calcification or venous malformation. No evidence of acute fracture or dislocation of the right knee. No significant effusion.  IMPRESSION: Degenerative changes in the right knee. No acute bony abnormalities suggested.   Electronically  Signed   By: Lucienne Capers M.D.   On: 11/08/2013 02:12   Ct Head Wo Contrast  11/08/2013   CLINICAL DATA:  Wound fell, striking right-sided head. History of multiple myeloma.  EXAM: CT HEAD WITHOUT CONTRAST  CT CERVICAL SPINE WITHOUT CONTRAST  TECHNIQUE: Multidetector CT imaging of the head and cervical spine was performed following the standard protocol without intravenous contrast. Multiplanar CT image reconstructions of the cervical spine were also generated.  COMPARISON:  MRI brain 07/15/2012. CT neck 07/12/2012. CT head 05/04/2012.  FINDINGS: CT HEAD FINDINGS  Minimal cerebral atrophy. No ventricular dilatation. Mild patchy white matter changes. No mass effect or midline shift. No abnormal extra-axial fluid collections. Gray-white matter junctions are distinct. Basal cisterns are not effaced. No evidence of acute intracranial hemorrhage. No depressed skull fractures. Visualized paranasal sinuses and mastoid air cells are not opacified. Subcutaneous soft tissue swelling over the right anterior frontal region and left parietal regions. Focal poorly defined lucency in the right anterior calvarium extending to the inner table likely representing changes of multiple myeloma. Diffuse demineralization of the skull. Vascular calcifications.  CT CERVICAL SPINE FINDINGS  Diffuse bone demineralization. Degenerative changes throughout the cervical spine with narrowed cervical interspaces and associated endplate hypertrophic changes. Mild retrolisthesis of C3 on C4 and mild anterolisthesis of C4 on C5 are likely degenerative. Alignment is unchanged since the prior study. Heterogeneous marrow signal intensity changes likely related to multiple myeloma. C1-2 articulation appears intact. No large destructive bone lesions are appreciated. No vertebral compression deformities. No prevertebral soft tissue swelling. Extensive vascular calcifications in the thoracic aorta.  IMPRESSION: No acute intracranial abnormalities.  Poorly defined lucent lesion in the right calvarium probably represents multiple myeloma change.  Diffuse bone demineralization with heterogeneous appearance possibly related to multiple myeloma. Degenerative changes. No displaced fractures identified.   Electronically Signed   By: Lucienne Capers M.D.   On: 11/08/2013 06:18   Ct Cervical Spine Wo Contrast  11/08/2013   CLINICAL DATA:  Wound fell, striking right-sided head. History of multiple myeloma.  EXAM: CT HEAD WITHOUT CONTRAST  CT CERVICAL SPINE WITHOUT CONTRAST  TECHNIQUE: Multidetector CT imaging of the head and cervical spine was performed following the standard protocol without intravenous contrast. Multiplanar CT image reconstructions of the cervical spine were also generated.  COMPARISON:  MRI brain 07/15/2012. CT neck 07/12/2012. CT head 05/04/2012.  FINDINGS: CT HEAD FINDINGS  Minimal cerebral atrophy. No ventricular dilatation. Mild patchy white matter changes. No mass effect or midline shift. No abnormal extra-axial fluid collections. Gray-white matter junctions are distinct. Basal cisterns are not effaced. No evidence of acute intracranial hemorrhage. No depressed skull fractures. Visualized paranasal sinuses and mastoid air cells are not opacified. Subcutaneous soft tissue swelling over the right anterior frontal region and left parietal regions. Focal poorly defined lucency in the right anterior calvarium extending to the inner table likely representing changes of multiple myeloma. Diffuse demineralization of the skull. Vascular calcifications.  CT CERVICAL SPINE FINDINGS  Diffuse bone demineralization. Degenerative changes throughout the cervical spine with narrowed cervical interspaces and associated endplate hypertrophic changes. Mild retrolisthesis of C3 on C4 and mild anterolisthesis of C4 on C5 are likely degenerative. Alignment is unchanged since the prior study. Heterogeneous marrow signal intensity changes likely related to multiple  myeloma. C1-2 articulation appears intact. No large destructive bone lesions are appreciated. No vertebral compression deformities. No prevertebral soft tissue swelling. Extensive vascular calcifications in the thoracic aorta.  IMPRESSION: No acute intracranial abnormalities. Poorly defined lucent lesion in the right calvarium probably represents multiple myeloma change.  Diffuse bone demineralization with heterogeneous appearance possibly related to multiple myeloma. Degenerative changes. No displaced fractures identified.   Electronically Signed   By: Lucienne Capers M.D.   On: 11/08/2013 06:18   Ct Hip Right Wo  Contrast  11/08/2013   CLINICAL DATA:  [place] at midnight. Right hip pain. History of multiple myeloma.  EXAM: CT OF THE RIGHT HIP WITHOUT CONTRAST  TECHNIQUE: Multidetector CT imaging was performed according to the standard protocol. Multiplanar CT image reconstructions were also generated.  COMPARISON:  06/06/2013  FINDINGS: There is cortical irregularity involving the greater femoral trochanter consistent with nondisplaced fracture. Femoral neck appears intact. No dislocation of the right hip. Degenerative changes are present in the right hip. Degenerative changes in the sacroiliac and symphysis pubis. Diffuse bone demineralization. Kyphoplasty changes at L5 vertebra. Incidental note of an anterior pelvic wall hernia extending to the right inguinal region an containing bowel. This was present on previous comparison study. Posterior right bladder wall diverticulum also present previously. Presumed injection granulomas in the subcutaneous soft tissues. Extensive vascular calcifications.  IMPRESSION: Nondisplaced fracture of the greater trochanter the right hip. Diffuse bone demineralization. Anterior pelvic wall/right inguinal hernia demonstrated.   Electronically Signed   By: Lucienne Capers M.D.   On: 11/08/2013 06:12    EKG: pending.  Assessment/Plan Active Problems:   Greater trochanter  fracture   Hip fracture   Fall with right greater trochanteric fracture:  - admit to med surg. Pain control.  - pT/OT eval.   UTI: -on rocephin and urine cultures are added on.   Multiple myeloma: - resume home medications.   Hyponatremia: - probably from dehydration.  - IV fluids AND REPEAT IN am.  - if persistently high, evaluate for SIADH.  - tsh ordered and pending.    Anemia: - baseline hemoglobin.  - monitor.   Hypertension: - controlled.   Hypoxia after fentanyl given: - on nasal oxygen.    DVT prophylaxis    Code Status: DNR Family Communication: DISCUSSED with daughter at bedside.  Disposition Plan: admit to medsurg  Bed.   Time spent: 65 min  [facility] Pager [phone]  **Disclaimer: This note may have been dictated with voice recognition software. Similar sounding words can inadvertently be transcribed and this note may contain transcription errors which may not have been corrected upon publication of note.**

## 2013-11-10 LAB — BASIC METABOLIC PANEL
BUN: 14 mg/dL (ref 6–23)
CHLORIDE: 92 meq/L — AB (ref 96–112)
CO2: 26 mEq/L (ref 19–32)
Calcium: 8.8 mg/dL (ref 8.4–10.5)
Creatinine, Ser: 0.56 mg/dL (ref 0.50–1.10)
GFR, EST NON AFRICAN AMERICAN: 82 mL/min — AB (ref >90)
Glucose, Bld: 117 mg/dL — ABNORMAL HIGH (ref 70–99)
Potassium: 3.8 mEq/L (ref 3.7–5.3)
SODIUM: 132 meq/L — AB (ref 137–147)

## 2013-11-10 LAB — OSMOLALITY, URINE: Osmolality, Ur: 309 mOsm/kg — ABNORMAL LOW (ref 390–1090)

## 2013-11-10 LAB — OSMOLALITY: Osmolality: 274 mOsm/kg — ABNORMAL LOW (ref 275–300)

## 2013-11-10 LAB — GLUCOSE, CAPILLARY
GLUCOSE-CAPILLARY: 96 mg/dL (ref 70–99)
GLUCOSE-CAPILLARY: 155 mg/dL — AB (ref 70–99)

## 2013-11-10 LAB — HEMOGLOBIN A1C
Hgb A1c MFr Bld: 6.5 % — ABNORMAL HIGH (ref <5.7)
Mean Plasma Glucose: 140 mg/dL — ABNORMAL HIGH (ref <117)

## 2013-11-10 LAB — CORTISOL: CORTISOL PLASMA: 16.8 ug/dL

## 2013-11-10 LAB — TSH: TSH: 1.55 u[IU]/mL (ref 0.350–4.500)

## 2013-11-10 NOTE — Progress Notes (Signed)
[facility] PROGRESS NOTE  Christina Hopkins BSJ:[id number] DOB: 1925/08/19 DOA: 11/09/2013 PCP: [name], MD Interim summary: Christina Hopkins is a 80 y.o. female with prior h/o multiple myeloma, hypertension, diabetes mellitus, had a recent fall few days ago, she was brought in by her daughter yesterday for unable to bear weight on her hip and feet. She was found to havea right greater trochanter avulsion fracture. orthopedics consulted , Dr Renard Hamper man recommended weight bearing as tolerated and outpatient follow up In 2 weeks. She was discharged home yesterday. But came Back today as she could not bear the hip apin and her daughter could nt manage her at home. i could not get any hstory from the patient as she was sound asleep from the fentanyl she was given earlier. Most of the history was obtained fromt he daughter at bedside. She is admitted to medical service for further evaluation.   Assessment/Plan: 1.  Fall with right greater trochanteric fracture:  - admit to med surg. Pain control.  - pT/OT eval.  UTI:  -on rocephin and urine cultures are added on.  Multiple myeloma:  - resume home medications.  Hyponatremia:  - probably from dehydration. Improving.  - if persistently high, evaluate for SIADH.  - tsh normal. .  Anemia:  - baseline hemoglobin.  - monitor.  Hypertension:  - controlled.  Hypoxia after fentanyl given:  - on nasal oxygen.  DVT prophylaxis  Code Status: DNR Family Communication: NONE AT BEDSIDE Disposition Plan: pending PT eval.    Consultants:  Physical therapy.   Procedures:  none  Antibiotics:  rocephin  HPI/Subjective: Pain well controlled.   Objective: Filed Vitals:   11/10/13 1352  BP: 102/60  Pulse: 78  Temp: 98.1 F (36.7 C)  Resp: 16    Intake/Output Summary (Last 24 hours) at 11/10/13 1653 Last data filed at 11/10/13 1353  Gross per 24 hour  Intake 1868.75 ml  Output   1150 ml  Net 718.75 ml   Filed Weights    11/10/13 1500  Weight: 43.999 kg (97 lb)    Exam:   General:  Alert afebrile comfortable  Cardiovascular: s1s2  Respiratory: ctab  Abdomen: soft TN ND BS+  Musculoskeletal: no pedal edema.   Data Reviewed: Basic Metabolic Panel:  Recent Labs Lab 11/09/13 1516 11/10/13 0432  NA 129* 132*  K 4.0 3.8  CL 90* 92*  CO2 26 26  GLUCOSE 157* 117*  BUN 18 14  CREATININE 0.70 0.56  CALCIUM 9.1 8.8   Liver Function Tests: No results found for this basename: AST, ALT, ALKPHOS, BILITOT, PROT, ALBUMIN,  in the last 168 hours No results found for this basename: LIPASE, AMYLASE,  in the last 168 hours No results found for this basename: AMMONIA,  in the last 168 hours CBC:  Recent Labs Lab 11/06/13 0837 11/09/13 1516  WBC 4.5 7.6  NEUTROABS 3.3  --   HGB 9.1* 9.2*  HCT 28.7* 27.7*  MCV 100.7 96.9  PLT 151 181   Cardiac Enzymes: No results found for this basename: CKTOTAL, CKMB, CKMBINDEX, TROPONINI,  in the last 168 hours BNP (last 3 results)  Recent Labs  03/23/13 1036  PROBNP 2180.0*   CBG:  Recent Labs Lab 11/09/13 2156 11/10/13 0734 11/10/13 1147  GLUCAP 180* 96 155*    No results found for this or any previous visit (from the past 240 hour(s)).   Studies: Dg Chest 2 View  11/09/2013   CLINICAL DATA:  Hypoxia. Chest pain  after fall. History multiple myeloma.  EXAM: CHEST  2 VIEW  COMPARISON:  10/30/2013 bone survey.  Chest radiographs 09/23/2013.  FINDINGS: Sequelae of prior CABG are again identified. Cardiac silhouette appears mildly enlarged, unchanged. Thoracic aortic calcification is noted. Hiatal hernia is again noted. There is mild pulmonary vascular congestion. No pleural effusion or pneumothorax is identified. No segmental airspace consolidation is seen. The bones are osteopenic with prior vertebral cement augmentation again noted in the lower thoracic spine. Mild wedging of a few mid thoracic vertebral bodies is unchanged. Old left rib fractures  are again seen.  IMPRESSION: Cardiomegaly and mild pulmonary vascular congestion.   Electronically Signed   By: Logan Bores   On: 11/09/2013 18:31   Dg Hip Complete Right  11/09/2013   CLINICAL DATA:  pain  EXAM: RIGHT HIP - COMPLETE 2+ VIEW  COMPARISON:  CT from the previous day  FINDINGS: Minimally distracted avulsion fracture of the greater trochanter. No definite extension across the femoral neck or head. No dislocation. No other fracture or acute bone abnormality. Changes of kyphoplasty/ vertebroplasty in the lower lumbar spine. Extensive bilateral iliofemoral arterial calcifications. Mild diffuse osteopenia.  IMPRESSION: 1. Little definite change in appearance of right greater trochanter avulsion fracture.   Electronically Signed   By: Arne Cleveland M.D.   On: 11/09/2013 15:46    Scheduled Meds: . acyclovir  400 mg Oral BID  . calcium carbonate  1 tablet Oral Daily  . carvedilol  25 mg Oral BID WC  . cefTRIAXone (ROCEPHIN)  IV  1 g Intravenous Q24H  . [START ON 12/02/2013] cyanocobalamin  1,000 mcg Intramuscular Q30 days  . enoxaparin (LOVENOX) injection  40 mg Subcutaneous Q24H  . lisinopril  2.5 mg Oral QODAY  . multivitamin with minerals  1 tablet Oral Daily  . pantoprazole  40 mg Oral Daily  . pomalidomide  2 mg Oral Daily   Continuous Infusions:   Active Problems:   Multiple myeloma   Anemia of chronic disease   Diabetes mellitus   Hypertension   Fall   Hyponatremia   Greater trochanter fracture   Hip fracture    Time spent: 25 minutes.     [facility] Pager [phone]. If 7PM-7AM, please contact night-coverage at [email or web address], password [facility] 11/10/2013, 4:53 PM  LOS: 1 day

## 2013-11-10 NOTE — Evaluation (Signed)
Physical Therapy Evaluation Patient Details Name: Christina Hopkins MRN: [id number] DOB: [birth year] Today's Date: 11/10/2013   History of Present Illness    Christina Hopkins is a 80 y.o. female with prior h/o multiple myeloma, hypertension, diabetes mellitus, had a recent fall few days ago, she was brought in by her daughter 11/09/13 unable to bear weight on her R LE. She was found to have a right greater trochanter avulsion fracture. orthopedics consulted , Dr Renard Hamper man recommended weight bearing as tolerated and outpatient follow up In 2 weeks. She was discharged home 11/07/13 but came back as she could not bear the hip pain and her daughter could not manage her at home   Clinical Impression  Pt s/p non-operative R hip fx presents with decreased R LE strength/ROM and hip pain limiting functional mobility.  Pt would benefit from follow up rehab at SNF level to maximize IND and safety prior to return home with ltd assist.    Follow Up Recommendations Home health PT    Equipment Recommendations  None recommended by PT    Recommendations for Other Services OT consult     Precautions / Restrictions Precautions Precautions: Fall Restrictions Weight Bearing Restrictions: No RLE Weight Bearing: Weight bearing as tolerated Other Position/Activity Restrictions: WBAT per pt notes      Mobility  Bed Mobility Overal bed mobility: +2 for physical assistance             General bed mobility comments: cues for sequence and use of L LE to self assist - pt assisted with pad to EOB  Transfers Overall transfer level: Needs assistance Equipment used: Rolling walker (2 wheeled) Transfers: Sit to/from Stand Sit to Stand: Mod assist;+2 physical assistance         General transfer comment: cues for LE management and use of UEs to self assist  Ambulation/Gait Ambulation/Gait assistance: Mod assist;+2 physical assistance;+2 safety/equipment Ambulation Distance (Feet): 12 Feet Assistive device:  Rolling walker (2 wheeled) Gait Pattern/deviations: Step-to pattern;Decreased step length - right;Decreased step length - left;Shuffle;Trunk flexed Gait velocity: decr   General Gait Details: cues for sequence, posture, stride length and position from [employer]            Wheelchair Mobility    Modified Rankin (Stroke Patients Only)       Balance                                             Pertinent Vitals/Pain 7/10; premed, ice pack provided    Home Living Family/patient expects to be discharged to:: Skilled nursing facility Living Arrangements: Children               Additional Comments: Pt states lives with daughter who works during the day    Prior Function Level of Independence: Independent with assistive device(s);Needs assistance         Comments: Pt unable to ambulate since fall 11/07/13     Hand Dominance   Dominant Hand: Left    Extremity/Trunk Assessment   Upper Extremity Assessment: RUE deficits/detail;LUE deficits/detail RUE Deficits / Details: Min AROM shoulder flex/abd     LUE Deficits / Details: Min shoulder AROM into flex/abd   Lower Extremity Assessment: RLE deficits/detail RLE Deficits / Details: AAROM at hip to 60 flex and 15 abd with min discomfort    Cervical / Trunk Assessment: Kyphotic  Communication  Communication: No difficulties  Cognition Arousal/Alertness: Awake/alert Behavior During Therapy: WFL for tasks assessed/performed Overall Cognitive Status: Within Functional Limits for tasks assessed                      General Comments      Exercises Total Joint Exercises Ankle Circles/Pumps: AROM;Both;15 reps;Supine Heel Slides: AAROM;Right;10 reps;Supine Hip ABduction/ADduction: AAROM;10 reps;Supine;Right      Assessment/Plan    PT Assessment Patient needs continued PT services  PT Diagnosis Difficulty walking   PT Problem List Decreased strength;Decreased range of  motion;Decreased activity tolerance;Decreased mobility;Decreased knowledge of use of DME;Pain  PT Treatment Interventions DME instruction;Gait training;Stair training;Functional mobility training;Therapeutic activities;Therapeutic exercise;Patient/family education   PT Goals (Current goals can be found in the Care Plan section) Acute Rehab PT Goals Patient Stated Goal: Less pain so I can walk again PT Goal Formulation: With patient Time For Goal Achievement: 11/23/13 Potential to Achieve Goals: Good    Frequency 7X/week   Barriers to discharge        Co-evaluation               End of Session Equipment Utilized During Treatment: Gait belt Activity Tolerance: Patient tolerated treatment well Patient left: in chair;with call bell/phone within reach;with family/visitor present Nurse Communication: Mobility status         Time: 1110-1140 PT Time Calculation (min): 30 min   Charges:   PT Evaluation $Initial PT Evaluation Tier I: 1 Procedure PT Treatments $Gait Training: 8-22 mins $Therapeutic Exercise: 8-22 mins   PT G Codes:          BRADSHAW,HUNTER 11/10/2013, 12:16 PM

## 2013-11-10 NOTE — Progress Notes (Signed)
CSW consult received for SNF.  Chart review.  Noted that PT recommending HHPT.  No CSW needs identified.  CSW to sign off.  Bernita Raisin, [place] [facility] [phone]

## 2013-11-10 NOTE — Progress Notes (Signed)
Physical Therapy Treatment Patient Details Name: Christina Hopkins MRN: [id number] DOB: Feb 13, 1926 Today's Date: 11/10/2013    History of Present Illness      PT Comments    Pt pleasant and cooperative but impulsive and with questionable safety awareness   Follow Up Recommendations  SNF     Equipment Recommendations  None recommended by PT    Recommendations for Other Services OT consult     Precautions / Restrictions Precautions Precautions: Fall Restrictions Weight Bearing Restrictions: No RLE Weight Bearing: Weight bearing as tolerated Other Position/Activity Restrictions: WBAT per pt notes    Mobility  Bed Mobility Overal bed mobility: +2 for physical assistance             General bed mobility comments: cues for sequence and use of L LE to self assist - pt assisted with pad to EOB  Transfers Overall transfer level: Needs assistance Equipment used: Rolling walker (2 wheeled) Transfers: Sit to/from Stand Sit to Stand: Mod assist;+2 physical assistance         General transfer comment: cues for LE management and use of UEs to self assist  Ambulation/Gait Ambulation/Gait assistance: Mod assist;+2 physical assistance Ambulation Distance (Feet): 16 Feet Assistive device: Rolling walker (2 wheeled) Gait Pattern/deviations: Step-to pattern;Decreased step length - right;Decreased step length - left;Narrow base of support;Trunk flexed;Shuffle Gait velocity: decr   General Gait Details: cues for sequence, posture, stride length and position from [employer]            Wheelchair Mobility    Modified Rankin (Stroke Patients Only)       Balance                                    Cognition Arousal/Alertness: Awake/alert Behavior During Therapy: WFL for tasks assessed/performed Overall Cognitive Status: Within Functional Limits for tasks assessed                      Exercises Total Joint Exercises Ankle Circles/Pumps:  AROM;Both;15 reps;Supine Heel Slides: AAROM;Right;10 reps;Supine Hip ABduction/ADduction: AAROM;10 reps;Supine;Right    General Comments        Pertinent Vitals/Pain 6/10; premed, pt declines ice pack.    Home Living Family/patient expects to be discharged to:: Skilled nursing facility Living Arrangements: Children             Additional Comments: Pt states lives with daughter who works during the day    Prior Function Level of Independence: Independent with assistive device(s);Needs assistance      Comments: Pt unable to ambulate since fall 11/07/13   PT Goals (current goals can now be found in the care plan section) Acute Rehab PT Goals Patient Stated Goal: Less pain so I can walk again PT Goal Formulation: With patient Time For Goal Achievement: 11/23/13 Potential to Achieve Goals: Good    Frequency  Min 3X/week    PT Plan Discharge plan needs to be updated    Co-evaluation             End of Session Equipment Utilized During Treatment: Gait belt Activity Tolerance: Patient tolerated treatment well Patient left: in bed;with call bell/phone within reach     Time: 1410-1426 PT Time Calculation (min): 16 min  Charges:  $Gait Training: 8-22 mins $Therapeutic Exercise: 8-22 mins  G Codes:      BRADSHAW,HUNTER 04-Dec-2013, 2:34 PM

## 2013-11-11 DIAGNOSIS — K922 Gastrointestinal hemorrhage, unspecified: Secondary | ICD-10-CM

## 2013-11-11 DIAGNOSIS — C9000 Multiple myeloma not having achieved remission: Secondary | ICD-10-CM

## 2013-11-11 DIAGNOSIS — E440 Moderate protein-calorie malnutrition: Secondary | ICD-10-CM

## 2013-11-11 DIAGNOSIS — Z85038 Personal history of other malignant neoplasm of large intestine: Secondary | ICD-10-CM

## 2013-11-11 DIAGNOSIS — I82409 Acute embolism and thrombosis of unspecified deep veins of unspecified lower extremity: Secondary | ICD-10-CM

## 2013-11-11 LAB — COMPREHENSIVE METABOLIC PANEL
ALT: 7 U/L (ref 0–35)
AST: 11 U/L (ref 0–37)
Albumin: 2.1 g/dL — ABNORMAL LOW (ref 3.5–5.2)
Alkaline Phosphatase: 40 U/L (ref 39–117)
BUN: 14 mg/dL (ref 6–23)
CALCIUM: 8.8 mg/dL (ref 8.4–10.5)
CO2: 27 mEq/L (ref 19–32)
CREATININE: 0.56 mg/dL (ref 0.50–1.10)
Chloride: 93 mEq/L — ABNORMAL LOW (ref 96–112)
GFR calc Af Amer: >90 mL/min (ref >90)
GFR calc non Af Amer: 82 mL/min — ABNORMAL LOW (ref >90)
Glucose, Bld: 133 mg/dL — ABNORMAL HIGH (ref 70–99)
Potassium: 3.7 mEq/L (ref 3.7–5.3)
SODIUM: 132 meq/L — AB (ref 137–147)
Total Bilirubin: 0.3 mg/dL (ref 0.3–1.2)
Total Protein: 6.6 g/dL (ref 6.0–8.3)

## 2013-11-11 LAB — URINE CULTURE

## 2013-11-11 LAB — GLUCOSE, CAPILLARY
GLUCOSE-CAPILLARY: 171 mg/dL — AB (ref 70–99)
GLUCOSE-CAPILLARY: 150 mg/dL — AB (ref 70–99)
GLUCOSE-CAPILLARY: 126 mg/dL — AB (ref 70–99)
Glucose-Capillary: 131 mg/dL — ABNORMAL HIGH (ref 70–99)
Glucose-Capillary: 125 mg/dL — ABNORMAL HIGH (ref 70–99)
Glucose-Capillary: 140 mg/dL — ABNORMAL HIGH (ref 70–99)

## 2013-11-11 MED ORDER — HYDROCODONE-ACETAMINOPHEN 5-325 MG PO TABS
1.0000 | ORAL_TABLET | ORAL | Status: DC | PRN
  Administered 2013-11-11 – 2013-11-12 (×3): 1 via ORAL
  Filled 2013-11-11 (×3): qty 1

## 2013-11-11 MED ORDER — FLEET ENEMA 7-19 GM/118ML RE ENEM
1.0000 | ENEMA | Freq: Every day | RECTAL | Status: DC | PRN
  Administered 2013-11-11: 1 via RECTAL
  Filled 2013-11-11: qty 1

## 2013-11-11 NOTE — Progress Notes (Signed)
Clinical Social Work Department BRIEF PSYCHOSOCIAL ASSESSMENT 11/11/2013  Patient:  Christina Hopkins, Christina Hopkins     Account Number:  [id number]     Admit date:  11/09/2013  Clinical Social Worker:  Lacie Scotts  Date/Time:  11/11/2013 10:38 AM  Referred by:  Physician  Date Referred:  11/09/2013 Referred for  SNF Placement   Other Referral:   Interview type:  Family Other interview type:    PSYCHOSOCIAL DATA Living Status:  FAMILY Admitted from facility:   Level of care:   Primary support name:  [place] Primary support relationship to patient:  CHILD, ADULT Degree of support available:   supportive but works full time    CURRENT CONCERNS Current Concerns  Post-Acute Placement   Other Concerns:    SOCIAL WORK ASSESSMENT / PLAN Pt is an 80 yr old female living at home woth daughter prior to hospitalization. Pt has a trochanter fx resulting from a fall. Surgery is not recommended at this time. CSW spoke with pt's daughter ( pt sleeping soundly ) to assist with d/c planning. ST Rehab will be needed following hospital d/c. SNF search has been initiated and bed offers will be provided to family as received. [employer] has been contacted and authorization is pending.   Assessment/plan status:  Psychosocial Support/Ongoing Assessment of Needs Other assessment/ plan:   Information/referral to community resources:   Insurance coverage for SNF and ambulance transport has been reviewed.    PATIENT'S/FAMILY'S RESPONSE TO PLAN OF CARE: Pt's daughter agrees that rehab is needed. She appreciates assistance with d/c planning.   Werner Lean LCSW [phone]

## 2013-11-11 NOTE — Progress Notes (Signed)
Clinical Social Work Department CLINICAL SOCIAL WORK PLACEMENT NOTE 11/11/2013  Patient:  Christina Hopkins, Christina Hopkins  Account Number:  [id number] Admit date:  11/09/2013  Clinical Social Worker:  Werner Lean, LCSW  Date/time:  11/11/2013 10:49 AM  Clinical Social Work is seeking post-discharge placement for this patient at the following level of care:   SKILLED NURSING   (*CSW will update this form in Epic as items are completed)     Patient/family provided with [facility] Department of Clinical Social Work's list of facilities offering this level of care within the geographic area requested by the patient (or if unable, by the patient's family).  11/11/2013  Patient/family informed of their freedom to choose among providers that offer the needed level of care, that participate in Medicare, Medicaid or managed care program needed by the patient, have an available bed and are willing to accept the patient.    Patient/family informed of [facility]' ownership interest in [facility], as well as of the fact that they are under no obligation to receive care at this facility.  PASARR submitted to EDS on 11/11/2013 PASARR number received on 11/11/2013  FL2 transmitted to all facilities in geographic area requested by pt/family on  11/18/2013 FL2 transmitted to all facilities within larger geographic area on   Patient informed that his/her managed care company has contracts with or will negotiate with  certain facilities, including the following:     Patient/family informed of bed offers received:   Patient chooses bed at  Physician recommends and patient chooses bed at    Patient to be transferred to  on   Patient to be transferred to facility by  Patient and family notified of transfer on  Name of family member notified:    The following physician request were entered in Epic:   Additional Comments:   Werner Lean LCSW [phone]

## 2013-11-11 NOTE — Progress Notes (Signed)
[facility] PROGRESS NOTE  Christina Hopkins:[id number] DOB: [birth year] DOA: 11/09/2013 PCP: [name], MD Interim summary: Christina Hopkins is a 80 y.o. female with prior h/o multiple myeloma, hypertension, diabetes mellitus, had a recent fall few days ago, she was brought in by her daughter yesterday for unable to bear weight on her hip and feet. She was found to havea right greater trochanter avulsion fracture. orthopedics consulted , Dr Renard Hamper man recommended weight bearing as tolerated and outpatient follow up In 2 weeks. She was discharged home yesterday. But came Back today as she could not bear the hip apin and her daughter could nt manage her at home. i could not get any hstory from the patient as she was sound asleep from the fentanyl she was given earlier. Most of the history was obtained fromt he daughter at bedside. She is admitted to medical service for further evaluation.   Assessment/Plan: 1.  Fall with right greater trochanteric fracture:  - admit to med surg. Pain control.  - pT/OT eval recommended SNF.  UTI:  -on rocephin and urine cultures are added on SHOWED multiple bacterial morphotypes, will complete the course of the antibiotics.  Multiple myeloma:  - resume home medications.  Hyponatremia:  - probably from dehydration. Improving.  - if persistently high, evaluate for SIADH.  - tsh normal. .  Anemia:  - baseline hemoglobin.  - monitor.  Hypertension:  - controlled.  Hypoxia after fentanyl given:  - on nasal oxygen.  Later on taken off the oxygen with good sats.   Hyperglycemia: hgba1c is 6.5. SSI.  DVT prophylaxis  Code Status: DNR Family Communication: NONE AT BEDSIDE Disposition Plan: pending PT eval.    Consultants:  Physical therapy.   Procedures:  none  Antibiotics:  rocephin  HPI/Subjective: Pain well controlled.   Objective: Filed Vitals:   11/11/13 1405  BP: 91/53  Pulse: 83  Temp: 98.2 F (36.8 C)  Resp: 14     Intake/Output Summary (Last 24 hours) at 11/11/13 1736 Last data filed at 11/11/13 1257  Gross per 24 hour  Intake    600 ml  Output      0 ml  Net    600 ml   Filed Weights   11/10/13 1500  Weight: 43.999 kg (97 lb)    Exam:   General:  Alert afebrile comfortable  Cardiovascular: s1s2  Respiratory: ctab  Abdomen: soft TN ND BS+  Musculoskeletal: no pedal edema.   Data Reviewed: Basic Metabolic Panel:  Recent Labs Lab 11/09/13 1516 11/10/13 0432 11/11/13 0524  NA 129* 132* 132*  K 4.0 3.8 3.7  CL 90* 92* 93*  CO2 _0 GLUCOSE 157* 117* 133*  BUN _1 CREATININE 0.70 0.56 0.56  CALCIUM 9.1 8.8 8.8   Liver Function Tests:  Recent Labs Lab 11/11/13 0524  AST 11  ALT 7  ALKPHOS 40  BILITOT 0.3  PROT 6.6  ALBUMIN 2.1*   No results found for this basename: LIPASE, AMYLASE,  in the last 168 hours No results found for this basename: AMMONIA,  in the last 168 hours CBC:  Recent Labs Lab 11/06/13 0837 11/09/13 1516  WBC 4.5 7.6  NEUTROABS 3.3  --   HGB 9.1* 9.2*  HCT 28.7* 27.7*  MCV 100.7 96.9  PLT 151 181   Cardiac Enzymes: No results found for this basename: CKTOTAL, CKMB, CKMBINDEX, TROPONINI,  in the last 168 hours BNP (last 3 results)  Recent Labs  03/23/13 1036  PROBNP 2180.0*   CBG:  Recent Labs Lab 11/10/13 1147 11/10/13 1813 11/10/13 2117 11/11/13 0723 11/11/13 1207  GLUCAP 155* 131* 171* 150* 126*    Recent Results (from the past 240 hour(s))  URINE CULTURE     Status: None   Collection Time    11/08/13  3:21 AM      Result Value Ref Range Status   Specimen Description URINE, RANDOM   Final   Special Requests NONE   Final   Culture  Setup Time     Final   Value: 11/10/2013 03:32     Performed at [facility]     Final   Value: >=100,000 COLONIES/ML     Performed at [facility]   Culture     Final   Value: Multiple bacterial morphotypes present, none predominant.  Suggest appropriate recollection if clinically indicated.     Performed at [facility]   Report Status 11/11/2013 FINAL   Final     Studies: Dg Chest 2 View  11/09/2013   CLINICAL DATA:  Hypoxia. Chest pain after fall. History multiple myeloma.  EXAM: CHEST  2 VIEW  COMPARISON:  10/30/2013 bone survey.  Chest radiographs 09/23/2013.  FINDINGS: Sequelae of prior CABG are again identified. Cardiac silhouette appears mildly enlarged, unchanged. Thoracic aortic calcification is noted. Hiatal hernia is again noted. There is mild pulmonary vascular congestion. No pleural effusion or pneumothorax is identified. No segmental airspace consolidation is seen. The bones are osteopenic with prior vertebral cement augmentation again noted in the lower thoracic spine. Mild wedging of a few mid thoracic vertebral bodies is unchanged. Old left rib fractures are again seen.  IMPRESSION: Cardiomegaly and mild pulmonary vascular congestion.   Electronically Signed   By: Logan Bores   On: 11/09/2013 18:31    Scheduled Meds: . acyclovir  400 mg Oral BID  . calcium carbonate  1 tablet Oral Daily  . carvedilol  25 mg Oral BID WC  . cefTRIAXone (ROCEPHIN)  IV  1 g Intravenous Q24H  . [START ON 12/02/2013] cyanocobalamin  1,000 mcg Intramuscular Q30 days  . enoxaparin (LOVENOX) injection  40 mg Subcutaneous Q24H  . lisinopril  2.5 mg Oral QODAY  . multivitamin with minerals  1 tablet Oral Daily  . pantoprazole  40 mg Oral Daily  . pomalidomide  2 mg Oral Daily   Continuous Infusions:   Active Problems:   Multiple myeloma   Anemia of chronic disease   Diabetes mellitus   Hypertension   Fall   Hyponatremia   Greater trochanter fracture   Hip fracture    Time spent: 25 minutes.     [facility] Pager [phone]. If 7PM-7AM, please contact night-coverage at [email or web address], password [facility] 11/11/2013, 5:36 PM  LOS: 2 days

## 2013-11-11 NOTE — Progress Notes (Signed)
Christina Hopkins   DOB:1925/08/27   TX#:[id number]   YQM#:[id number]  Subjective: She reports pain mostly with movement.  Otherwise, she denies any acute overnight events.    Objective:  Filed Vitals:   11/11/13 0615  BP: 108/67  Pulse: 75  Temp: 98 F (36.7 C)  Resp: 16    Body mass index is 21.76 kg/(m^2).  Intake/Output Summary (Last 24 hours) at 11/11/13 0817 Last data filed at 11/11/13 0600  Gross per 24 hour  Intake 1628.75 ml  Output    200 ml  Net 1428.75 ml    Kyphotic, elderly female in NAD  Sclerae unicteric  Oropharynx clear  No peripheral adenopathy  Lungs clear -- no rales or rhonchi  Heart regular rate and rhythm  Abdomen benign  MSK no peripheral edema, Painful ROM of RLE  Neuro nonfocal  CBG (last 3)   Recent Labs  11/10/13 1813 11/10/13 2117 11/11/13 0723  GLUCAP 131* 171* 150*     Labs:  Lab Results  Component Value Date   WBC 7.6 11/09/2013   HGB 9.2* 11/09/2013   HCT 27.7* 11/09/2013   MCV 96.9 11/09/2013   PLT 181 11/09/2013   NEUTROABS 3.3 8/89/1694    Basic Metabolic Panel:  Recent Labs Lab 11/09/13 1516 11/10/13 0432 11/11/13 0524  NA 129* 132* 132*  K 4.0 3.8 3.7  CL 90* 92* 93*  CO2 26 26 27   GLUCOSE 157* 117* 133*  BUN 18 14 14   CREATININE 0.70 0.56 0.56  CALCIUM 9.1 8.8 8.8   GFR Estimated Creatinine Clearance: 31.4 ml/min (by C-G formula based on Cr of 0.56). Liver Function Tests:  Recent Labs Lab 11/11/13 0524  AST 11  ALT 7  ALKPHOS 40  BILITOT 0.3  PROT 6.6  ALBUMIN 2.1*   No results found for this basename: LIPASE, AMYLASE,  in the last 168 hours No results found for this basename: AMMONIA,  in the last 168 hours Coagulation profile No results found for this basename: INR, PROTIME,  in the last 168 hours  CBC:  Recent Labs Lab 11/06/13 0837 11/09/13 1516  WBC 4.5 7.6  NEUTROABS 3.3  --   HGB 9.1* 9.2*  HCT 28.7* 27.7*  MCV 100.7 96.9  PLT 151 181   Cardiac Enzymes: No results found for this  basename: CKTOTAL, CKMB, CKMBINDEX, TROPONINI,  in the last 168 hours BNP: No components found with this basename: POCBNP,  CBG:  Recent Labs Lab 11/10/13 0734 11/10/13 1147 11/10/13 1813 11/10/13 2117 11/11/13 0723  GLUCAP 96 155* 131* 171* 150*   D-Dimer No results found for this basename: DDIMER,  in the last 72 hours Hgb A1c  Recent Labs  11/10/13 0432  HGBA1C 6.5*   Lipid Profile No results found for this basename: CHOL, HDL, LDLCALC, TRIG, CHOLHDL, LDLDIRECT,  in the last 72 hours Thyroid function studies  Recent Labs  11/10/13 0432  TSH 1.550   Anemia work up No results found for this basename: VITAMINB12, FOLATE, FERRITIN, TIBC, IRON, RETICCTPCT,  in the last 72 hours Microbiology Recent Results (from the past 240 hour(s))  URINE CULTURE     Status: None   Collection Time    11/08/13  3:21 AM      Result Value Ref Range Status   Specimen Description URINE, RANDOM   Final   Special Requests NONE   Final   Culture  Setup Time     Final   Value: 11/10/2013 03:32     Performed at  [facility]     Final   Value: >=100,000 COLONIES/ML     Performed at [facility]     Final   Value: Multiple bacterial morphotypes present, none predominant. Suggest appropriate recollection if clinically indicated.     Performed at [facility]   Report Status 11/11/2013 FINAL   Final   Studies:  Dg Chest 2 View  11/09/2013   CLINICAL DATA:  Hypoxia. Chest pain after fall. History multiple myeloma.  EXAM: CHEST  2 VIEW  COMPARISON:  10/30/2013 bone survey.  Chest radiographs 09/23/2013.  FINDINGS: Sequelae of prior CABG are again identified. Cardiac silhouette appears mildly enlarged, unchanged. Thoracic aortic calcification is noted. Hiatal hernia is again noted. There is mild pulmonary vascular congestion. No pleural effusion or pneumothorax is identified. No segmental airspace consolidation is seen. The bones are osteopenic  with prior vertebral cement augmentation again noted in the lower thoracic spine. Mild wedging of a few mid thoracic vertebral bodies is unchanged. Old left rib fractures are again seen.  IMPRESSION: Cardiomegaly and mild pulmonary vascular congestion.   Electronically Signed   By: Logan Bores   On: 11/09/2013 18:31   Dg Hip Complete Right  11/09/2013   CLINICAL DATA:  pain  EXAM: RIGHT HIP - COMPLETE 2+ VIEW  COMPARISON:  CT from the previous day  FINDINGS: Minimally distracted avulsion fracture of the greater trochanter. No definite extension across the femoral neck or head. No dislocation. No other fracture or acute bone abnormality. Changes of kyphoplasty/ vertebroplasty in the lower lumbar spine. Extensive bilateral iliofemoral arterial calcifications. Mild diffuse osteopenia.  IMPRESSION: 1. Little definite change in appearance of right greater trochanter avulsion fracture.   Electronically Signed   By: Arne Cleveland M.D.   On: 11/09/2013 15:46    Assessment/Plan: 80 y.o.  1. Fall complicated by greater trochanter avulsion fracture. --She is stable.  Pain is controlled.  PT/OT.  2. Multiple myeloma. --We will hold her chemotherapy for now secondary to above. Her MM did appear to be progressing, and we switched to pomalyst daily.   3. Anemia of chronic disease plus/minus chemotherapy. --Continue aranesp monthly as needed.   4. DVT prophylaxis. --TEDS or lovenox as tolerated.   5. Hypoalbuminemia --Albumin is 2.1.  Continue carnation breakfast and ensure as tolerated.   6. Disposition. --DNR.  Follow up for labs and symptom visit within one week of the next visit.   Thanks for taking care of [name].   CHISM, DAVID, MD 11/11/2013  8:17 AM

## 2013-11-11 NOTE — Progress Notes (Signed)
Physical Therapy Treatment Patient Details Name: Christina Hopkins MRN: [id number] DOB: 11-Jul-1925 Today's Date: 11/11/2013    History of Present Illness Christina Hopkins is a 80 y.o. female with prior h/o multiple myeloma, hypertension, diabetes mellitus, had a recent fall few days ago, she was brought in by her daughter 11/09/13 unable to bear weight on her R LE. She was found to have a right greater trochanter avulsion fracture. orthopedics consulted , Dr Renard Hamper man recommended weight bearing as tolerated and outpatient follow up In 2 weeks. She was discharged home 11/07/13 but came back as she could not bear the hip pain and her daughter could not manage her at home    PT Comments    Assisted pt OOB with increased time due to MAX c/o R hip pain.  Assisted R LE off bed.  Sat EOB x 4 min before sttempt to stand using + 2 assist for safety.  Amb limited distance with recliner chair following.  Pt is WBAT however performs TTWB due to MAX c/o R hip pain.  Positioned in recliner with multiple pillows and applied ICE.  Follow Up Recommendations  SNF  Pt will need [facility]    Equipment Recommendations  None recommended by PT    Recommendations for Other Services       Precautions / Restrictions Precautions Precautions: Fall Restrictions Weight Bearing Restrictions: No RLE Weight Bearing: Weight bearing as tolerated Other Position/Activity Restrictions: WBAT per pt notes    Mobility  Bed Mobility Overal bed mobility: +2 for physical assistance             General bed mobility comments: cues for sequence and use of L LE to self assist - pt assisted with pad to EOB plus increased time  Transfers Overall transfer level: Needs assistance Equipment used: Rolling walker (2 wheeled) Transfers: Sit to/from Stand Sit to Stand: Mod assist;+2 physical assistance         General transfer comment: cues for LE management and use of UEs to self assist.  Max c/o R hip  pain  Ambulation/Gait Ambulation/Gait assistance: +2 physical assistance;Mod assist Ambulation Distance (Feet): 12 Feet   Gait Pattern/deviations: Step-to pattern;Decreased stance time - right;Narrow base of support;Shuffle;Decreased step length - left;Decreased step length - right Gait velocity: decr   General Gait Details: cues for sequence, posture, stride length and position from RW.  Self limiting WB thru R LE due to c/o 8/10 pain in R hip   Stairs            Wheelchair Mobility    Modified Rankin (Stroke Patients Only)       Balance                                    Cognition Arousal/Alertness: Awake/alert Behavior During Therapy: WFL for tasks assessed/performed Overall Cognitive Status: Within Functional Limits for tasks assessed                      Exercises      General Comments        Pertinent Vitals/Pain C/o 8/10 R hip pain during activity    Home Living Family/patient expects to be discharged to:: Skilled nursing facility Living Arrangements: Children             Additional Comments: Pt states lives with daughter who works during the day    Prior Function  Level of Independence: Independent with assistive device(s);Needs assistance      Comments: Pt unable to ambulate since fall 11/07/13   PT Goals (current goals can now be found in the care plan section) Acute Rehab PT Goals Patient Stated Goal: get so i can talk- but i do dread going to rehab Progress towards PT goals: Progressing toward goals    Frequency  Min 3X/week    PT Plan      Co-evaluation             End of Session Equipment Utilized During Treatment: Gait belt Activity Tolerance: Patient limited by pain Patient left: in chair;with call bell/phone within reach     Time: 1028-1058 PT Time Calculation (min): 30 min  Charges:  $Gait Training: 8-22 mins $Therapeutic Activity: 8-22 mins                    G Codes:      Rica Koyanagi  PTA [facility]  Acute  Rehab Pager      [phone]

## 2013-11-11 NOTE — Progress Notes (Signed)
INITIAL NUTRITION ASSESSMENT  DOCUMENTATION CODES Per approved criteria  -Not Applicable   INTERVENTION: - Carnation Instant Breakfast BID - Encouraged excellent meal intake - Will continue to monitor   NUTRITION DIAGNOSIS: Increased nutrient needs related to right greater trochanteric fracture as evidenced by MD notes.   Goal: Pt to consume >90% of meals/supplements  Monitor:  Weights, labs, intake   Reason for Assessment: Malnutrition screening tool, consult for poor PO intake   80 y.o. female  Admitting Dx: Worsening hip pain   ASSESSMENT: Pt with h/o multiple myeloma, hypertension, diabetes mellitus, had a recent fall few days ago, she was brought in by her daughter for unable to bear weight on her hip and feet. She was found to have a right greater trochanter avulsion fracture.   - Pt alone in room - Reports eating 3 meals/day and a snack PTA with 1 Carnation Instant Breakfast/day - Has some problems chewing but denies any problems swallowing - States she weighed 140 pounds in 1994 and has gradually lost weight over time - Weight has been stable the past several months  - Reports her blood sugars have been under control until 2 weeks ago when she was given Decadron  - Ate 50% of her breakfast this morning - eggs and grits  Nutrition Focused Physical Exam:  Subcutaneous Fat:  Orbital Region: mild fat loss  Upper Arm Region: severe fat loss Thoracic and Lumbar Region: n/a  Muscle:  Temple Region: mild fat loss  Clavicle Bone Region: mild fat loss Clavicle and Acromion Bone Region: mild loss  Scapular Bone Region: n/a Dorsal Hand: severe fat loss  Patellar Region: n/a Anterior Thigh Region: n/a Posterior Calf Region: n/a  Edema: None noted     Height: Ht Readings from Last 1 Encounters:  11/10/13 4' 8"  (1.422 m)    Weight: Wt Readings from Last 1 Encounters:  11/10/13 97 lb (43.999 kg)    Ideal Body Weight: 93 lbs   % Ideal Body Weight: 104%    Wt Readings from Last 10 Encounters:  11/10/13 97 lb (43.999 kg)  10/23/13 97 lb 8 oz (44.226 kg)  10/09/13 96 lb 11.2 oz (43.863 kg)  09/11/13 98 lb 4.8 oz (44.589 kg)  08/14/13 100 lb 1.6 oz (45.405 kg)  08/06/13 99 lb (44.906 kg)  07/17/13 98 lb 12.8 oz (44.815 kg)  06/26/13 99 lb 1.6 oz (44.951 kg)  06/06/13 98 lb 6.4 oz (44.634 kg)  06/06/13 98 lb 6.4 oz (44.634 kg)    Usual Body Weight: 140 lbs in 1994   % Usual Body Weight: 69%  BMI:  Body mass index is 21.76 kg/(m^2).  Estimated Nutritional Needs: Kcal: 1100-1350 Protein: 50-70g Fluid: 1.1-1.3L/day   Skin: Intact   Diet Order: Criss Rosales  EDUCATION NEEDS: -No education needs identified at this time   Intake/Output Summary (Last 24 hours) at 11/11/13 1238 Last data filed at 11/11/13 0908  Gross per 24 hour  Intake    600 ml  Output      0 ml  Net    600 ml    Last BM: 6/27  Labs:   Recent Labs Lab 11/09/13 1516 11/10/13 0432 11/11/13 0524  NA 129* 132* 132*  K 4.0 3.8 3.7  CL 90* 92* 93*  CO2 26 26 27   BUN 18 14 14   CREATININE 0.70 0.56 0.56  CALCIUM 9.1 8.8 8.8  GLUCOSE 157* 117* 133*    CBG (last 3)   Recent Labs  11/10/13 2117 11/11/13 7124  11/11/13 1207  GLUCAP 171* 150* 126*    Scheduled Meds: . acyclovir  400 mg Oral BID  . calcium carbonate  1 tablet Oral Daily  . carvedilol  25 mg Oral BID WC  . cefTRIAXone (ROCEPHIN)  IV  1 g Intravenous Q24H  . [START ON 12/02/2013] cyanocobalamin  1,000 mcg Intramuscular Q30 days  . enoxaparin (LOVENOX) injection  40 mg Subcutaneous Q24H  . lisinopril  2.5 mg Oral QODAY  . multivitamin with minerals  1 tablet Oral Daily  . pantoprazole  40 mg Oral Daily  . pomalidomide  2 mg Oral Daily    Continuous Infusions:   Past Medical History  Diagnosis Date  . Hypertension   . Diabetes mellitus   . Anemia   . Osteoporosis   . Dyslipidemia   . Myocardial infarction   . Coronary artery disease   . Shortness of breath   . GERD  (gastroesophageal reflux disease)   . Wears glasses   . colon ca dx'd 2003    surg only  . Multiple myeloma dx'd 2009    oral chemo ongoing    Past Surgical History  Procedure Laterality Date  . Coronary artery bypass graft  08/2001    4 vessel  . Colon surgery  2003  . Tonsillectomy    . Appendectomy    . Abdominal hysterectomy    . Back surgery    . Eye surgery      cataracts  . Direct laryngoscopy with radiaesse injection Left 08/13/2012    Procedure: LEFT VOCAL CORD RADIESSE AUGMENTATION LARYNGOSCOPY ;  Surgeon: Jodi Marble, MD;  Location: [facility];  Service: ENT;  Laterality: Left;  . Colonoscopy N/A 06/13/2013    Procedure: COLONOSCOPY;  Surgeon: Jeryl Columbia, MD;  Location: [facility];  Service: Endoscopy;  Laterality: N/A;    Carlis Stable MS, [place], LDN [phone] Pager [phone] Weekend/After Hours Pager

## 2013-11-12 DIAGNOSIS — W19XXXA Unspecified fall, initial encounter: Secondary | ICD-10-CM

## 2013-11-12 DIAGNOSIS — Z5189 Encounter for other specified aftercare: Secondary | ICD-10-CM

## 2013-11-12 MED ORDER — HYDROCODONE-ACETAMINOPHEN 5-325 MG PO TABS: 1.0000 | ORAL_TABLET | ORAL | Status: DC | PRN

## 2013-11-12 MED ORDER — LIP MEDEX EX OINT
TOPICAL_OINTMENT | CUTANEOUS | Status: AC
  Administered 2013-11-12: 1
  Filled 2013-11-12: qty 7

## 2013-11-12 MED ORDER — CEPHALEXIN 500 MG PO CAPS: 500.0000 mg | ORAL_CAPSULE | Freq: Two times a day (BID) | ORAL | Status: DC

## 2013-11-12 MED ORDER — DEXTROSE 5 % IV SOLN
1.0000 g | Freq: Once | INTRAVENOUS | Status: DC
  Filled 2013-11-12: qty 10

## 2013-11-12 MED ORDER — DEXTROSE 5 % IV SOLN
1.0000 g | Freq: Once | INTRAVENOUS | Status: AC
  Administered 2013-11-12: 1 g via INTRAVENOUS
  Filled 2013-11-12: qty 10

## 2013-11-12 NOTE — Progress Notes (Signed)
Physical Therapy Treatment Patient Details Name: CRISTIAN GRIEVES MRN: [id number] DOB: [birth year] Today's Date: 11/12/2013    History of Present Illness Akaisha R Attaway is a 80 y.o. female with prior h/o multiple myeloma, hypertension, diabetes mellitus, had a recent fall few days ago, she was brought in by her daughter 11/09/13 unable to bear weight on her R LE. She was found to have a right greater trochanter avulsion fracture. orthopedics consulted , Dr Renard Hamper man recommended weight bearing as tolerated and outpatient follow up In 2 weeks. She was discharged home 11/07/13 but came back as she could not bear the hip pain and her daughter could not manage her at home    PT Comments    Assisted pt OOB to [facility].  Assisted with hygiene then back to bed.  Pt required increased time and care to minimize pain.  Follow Up Recommendations  SNF     Equipment Recommendations       Recommendations for Other Services       Precautions / Restrictions Precautions Precautions: Fall Restrictions Weight Bearing Restrictions: No RLE Weight Bearing: Weight bearing as tolerated Other Position/Activity Restrictions: WBAT per pt notes    Mobility  Bed Mobility Overal bed mobility: +2 for physical assistance             General bed mobility comments: increased time  Transfers Overall transfer level: Needs assistance Equipment used: None Transfers: Stand Pivot Transfers Sit to Stand: +2 physical assistance;Mod assist Stand pivot transfers: +2 physical assistance;Max assist       General transfer comment: asssited from bed to [facility] then back to bed with increase time  Ambulation/Gait Ambulation/Gait assistance: +2 physical assistance;Mod assist Ambulation Distance (Feet): 5 Feet (decreased amb distance due to increased c/o pain) Assistive device: Rolling walker (2 wheeled) Gait Pattern/deviations: Step-to pattern;Decreased stance time - right;Trunk flexed;Narrow base of support Gait velocity:  decr   General Gait Details: cues for sequence, posture, stride length and position from RW.  Self limiting WB thru R LE due to c/o 8/10 pain in R hip   Stairs            Wheelchair Mobility    Modified Rankin (Stroke Patients Only)       Balance                                    Cognition                            Exercises      General Comments        Pertinent Vitals/Pain C/o a lot pain but unable to rate    Home Living                      Prior Function            PT Goals (current goals can now be found in the care plan section) Progress towards PT goals: Progressing toward goals    Frequency  Min 3X/week    PT Plan      Co-evaluation             End of Session Equipment Utilized During Treatment: Gait belt Activity Tolerance: Patient limited by pain Patient left: in bed;with call bell/phone within reach     Time: 1355-1420 PT Time Calculation (min): 25 min  Charges:  $  Gait Training: 8-22 mins $Therapeutic Activity: 23-37 mins                    G Codes:      Rica Koyanagi  PTA [facility]  Acute  Rehab Pager      [phone]

## 2013-11-12 NOTE — Progress Notes (Signed)
Clinical Social Work Department CLINICAL SOCIAL WORK PLACEMENT NOTE 11/12/2013  Patient:  Christina Hopkins, Christina Hopkins  Account Number:  [id number] Admit date:  11/09/2013  Clinical Social Worker:  Werner Lean, LCSW  Date/time:  11/11/2013 10:49 AM  Clinical Social Work is seeking post-discharge placement for this patient at the following level of care:   SKILLED NURSING   (*CSW will update this form in Epic as items are completed)     Patient/family provided with [facility] Department of Clinical Social Work's list of facilities offering this level of care within the geographic area requested by the patient (or if unable, by the patient's family).  11/11/2013  Patient/family informed of their freedom to choose among providers that offer the needed level of care, that participate in Medicare, Medicaid or managed care program needed by the patient, have an available bed and are willing to accept the patient.    Patient/family informed of [facility]' ownership interest in [facility], as well as of the fact that they are under no obligation to receive care at this facility.  PASARR submitted to EDS on 11/11/2013 PASARR number received on 11/11/2013  FL2 transmitted to all facilities in geographic area requested by pt/family on  11/18/2013 FL2 transmitted to all facilities within larger geographic area on   Patient informed that his/her managed care company has contracts with or will negotiate with  certain facilities, including the following:     Patient/family informed of bed offers received:  11/11/2013 Patient chooses bed at [place] Physician recommends and patient chooses bed at    Patient to be transferred to [facility] on  11/12/2013 Patient to be transferred to facility by [facility] Patient and family notified of transfer on 11/12/2013 Name of family member notified:  DAUGHTER ANN  The following physician request were entered  in Epic:   Additional Comments: Pt / daughter were in agreement with d/c to SNF today. [employer] provided auth for SNF & Ambulance transport. NSG reviewed d/c summary, avs, scripts. Scripts were included in d/c packet.  Werner Lean LCSW [phone]

## 2013-11-12 NOTE — Discharge Summary (Signed)
Physician Discharge Summary  Christina Hopkins JSH:[id number] DOB: 1925/06/12 DOA: 11/09/2013  PCP: [name], MD  Admit date: 11/09/2013 Discharge date: 11/12/2013  Time spent: 30 minutes  Recommendations for Outpatient Follow-up:  1. Follow up with PCP in one week 2. Follow up with Dr Juliann Mule as recommended.   Discharge Diagnoses:  Active Problems:   Multiple myeloma   Anemia of chronic disease   Diabetes mellitus   Hypertension   Fall   Hyponatremia   Greater trochanter fracture   Hip fracture   Discharge Condition: improved  Diet recommendation: regular  Filed Weights   11/10/13 1500  Weight: 43.999 kg (97 lb)    History of present illness:  Christina Hopkins is a 80 y.o. female with prior h/o multiple myeloma, hypertension, diabetes mellitus, had a recent fall few days ago, she was brought in by her daughter yesterday for unable to bear weight on her hip and feet. She was found to havea right greater trochanter avulsion fracture. orthopedics consulted , Dr Renard Hamper man recommended weight bearing as tolerated and outpatient follow up In 2 weeks. She was discharged home yesterday. But came Back today as she could not bear the hip apin and her daughter could nt manage her at home. i could not get any hstory from the patient as she was sound asleep from the fentanyl she was given earlier. Most of the history was obtained fromt he daughter at bedside. She is admitted to medical service for further evaluation.    Hospital Course:  Fall with right greater trochanteric fracture:  - admitted to med surg. Pain control.  - pT/OT eval recommended SNF. Weight bearing as tolerated.  UTI:  -on rocephin and urine cultures are added on SHOWED multiple bacterial morphotypes, will complete the course of the antibiotics with keflex for 5 more days.   Multiple myeloma:  - resume home medications.  Hyponatremia:  - probably from dehydration. Improving.  - tsh normal. .  Anemia:  - baseline  hemoglobin.  - monitor.  Hypertension:  - controlled.   Hyperglycemia:  hgba1c is 6.5. Resume metformin.     Procedures:  none  Consultations:  Oncology    Discharge Exam: Filed Vitals:   11/11/13 2100  BP: 95/53  Pulse: 82  Temp: 98 F (36.7 C)  Resp: 16    General: alert afebrile comfortable Cardiovascular: s1s2 Respiratory: ctab  Discharge Instructions You were cared for by a hospitalist during your hospital stay. If you have any questions about your discharge medications or the care you received while you were in the hospital after you are discharged, you can call the unit and asked to speak with the hospitalist on call if the hospitalist that took care of you is not available. Once you are discharged, your primary care physician will handle any further medical issues. Please note that NO REFILLS for any discharge medications will be authorized once you are discharged, as it is imperative that you return to your primary care physician (or establish a relationship with a primary care physician if you do not have one) for your aftercare needs so that they can reassess your need for medications and monitor your lab values.  Discharge Instructions   Discharge instructions    Complete by:  As directed   Follow up with Dr Juliann Mule as recommended.  Continue antibiotics to complete the course for UTI.            Medication List  acyclovir 400 MG tablet  Commonly known as:  ZOVIRAX  Take 400 mg by mouth 2 (two) times daily.     calcium carbonate 500 MG chewable tablet  Commonly known as:  TUMS - dosed in mg elemental calcium  Chew 1 tablet by mouth daily.     carvedilol 25 MG tablet  Commonly known as:  COREG  Take 25 mg by mouth 2 (two) times daily with a meal.     cephALEXin 500 MG capsule  Commonly known as:  KEFLEX  Take 1 capsule (500 mg total) by mouth 2 (two) times daily.     cholecalciferol 1000 UNITS tablet  Commonly known as:  VITAMIN D   Take 5,000 Units by mouth daily.     cyanocobalamin 1000 MCG/ML injection  Commonly known as:  (VITAMIN B-12)  Inject 1,000 mcg into the muscle every 30 (thirty) days.     cyclobenzaprine 10 MG tablet  Commonly known as:  FLEXERIL  Take 10 mg by mouth 3 (three) times daily as needed for muscle spasms.     dexamethasone 4 MG tablet  Commonly known as:  DECADRON  Take 20 mg by mouth once a week. On Wednesdays     HYDROcodone-acetaminophen 5-325 MG per tablet  Commonly known as:  NORCO/VICODIN  Take 1-2 tablets by mouth every 4 (four) hours as needed for moderate pain.     lisinopril 2.5 MG tablet  Commonly known as:  PRINIVIL,ZESTRIL  Take 2.5 mg by mouth every other day.     loperamide 2 MG capsule  Commonly known as:  IMODIUM  Take 2 mg by mouth 3 (three) times daily as needed for diarrhea or loose stools.     magic mouthwash w/lidocaine Soln  Take 5 mLs by mouth 3 (three) times daily as needed for mouth pain.     magnesium oxide 400 MG tablet  Commonly known as:  MAG-OX  Take 1 tablet (400 mg total) by mouth 2 (two) times daily.     metFORMIN 500 MG tablet  Commonly known as:  GLUCOPHAGE  Take 500 mg by mouth 2 (two) times daily with a meal.     multivitamin with minerals Tabs tablet  Take 1 tablet by mouth daily.     pantoprazole 40 MG tablet  Commonly known as:  PROTONIX  Take 40 mg by mouth daily.     pomalidomide 2 MG capsule  Commonly known as:  POMALYST  Take 1 capsule (2 mg total) by mouth daily. Take with water on days 1-21. Repeat every 28 days.     prochlorperazine 10 MG tablet  Commonly known as:  COMPAZINE  Take 10 mg by mouth every 6 (six) hours as needed for nausea or vomiting.     SYSTANE 0.4-0.3 % Soln  Generic drug:  Polyethyl Glycol-Propyl Glycol  Place 1 drop into both eyes 3 (three) times daily as needed (dry eyes).     TIGER BALM PAIN RELIEVING 80-24-16 MG Ptch  Generic drug:  Camphor-Menthol-Capsicum  Apply 1 patch topically daily as  needed (pain).       Allergies  Allergen Reactions  . Gemfibrozil Nausea And Vomiting  . Nifedipine Hives  . Questran [Cholestyramine] Other (See Comments)    Do not remember       Follow-up Information   Follow up with [name], MD. Schedule an appointment as soon as possible for a visit in 1 week.   Specialty:  Family Medicine   Contact information:   [phone]  [place] [address] [phone]        The results of significant diagnostics from this hospitalization (including imaging, microbiology, ancillary and laboratory) are listed below for reference.    Significant Diagnostic Studies: Dg Chest 2 View  11/09/2013   CLINICAL DATA:  Hypoxia. Chest pain after fall. History multiple myeloma.  EXAM: CHEST  2 VIEW  COMPARISON:  10/30/2013 bone survey.  Chest radiographs 09/23/2013.  FINDINGS: Sequelae of prior CABG are again identified. Cardiac silhouette appears mildly enlarged, unchanged. Thoracic aortic calcification is noted. Hiatal hernia is again noted. There is mild pulmonary vascular congestion. No pleural effusion or pneumothorax is identified. No segmental airspace consolidation is seen. The bones are osteopenic with prior vertebral cement augmentation again noted in the lower thoracic spine. Mild wedging of a few mid thoracic vertebral bodies is unchanged. Old left rib fractures are again seen.  IMPRESSION: Cardiomegaly and mild pulmonary vascular congestion.   Electronically Signed   By: Logan Bores   On: 11/09/2013 18:31   Dg Lumbar Spine 2-3 Views  11/08/2013   CLINICAL DATA:  Fall.  Back pain.  EXAM: LUMBAR SPINE - 2-3 VIEW  COMPARISON:  Multiple previous examinations including CT abdomen 06/06/2013 sagittal reconstructions  FINDINGS: There has been previous vertebral augmentation at T11, T12 and L5. There are old partial compression fractures at T10, L1 and the inferior endplate of L3. These are not visibly change. No acute fractures seen. There is  degenerative disc disease throughout the region and there is degenerative facet disease in the lower lumbar spine. Atherosclerotic disease of the aorta and its branch vessels is noted.  IMPRESSION: No acute fracture seen. Previously augmented fractures at T11, T12 and L5. Old fractures at T10, L1 and L3, unchanged since the previous exams.   Electronically Signed   By: Nelson Chimes M.D.   On: 11/08/2013 07:11   Dg Hip Complete Right  11/09/2013   CLINICAL DATA:  pain  EXAM: RIGHT HIP - COMPLETE 2+ VIEW  COMPARISON:  CT from the previous day  FINDINGS: Minimally distracted avulsion fracture of the greater trochanter. No definite extension across the femoral neck or head. No dislocation. No other fracture or acute bone abnormality. Changes of kyphoplasty/ vertebroplasty in the lower lumbar spine. Extensive bilateral iliofemoral arterial calcifications. Mild diffuse osteopenia.  IMPRESSION: 1. Little definite change in appearance of right greater trochanter avulsion fracture.   Electronically Signed   By: Arne Cleveland M.D.   On: 11/09/2013 15:46   Dg Hip Complete Right  11/08/2013   CLINICAL DATA:  Patient fell this morning with anterior hip pain on movement. Right patellar pain.  EXAM: RIGHT HIP - COMPLETE 2+ VIEW  COMPARISON:  None.  FINDINGS: Degenerative changes in the lower lumbar spine and both hips. Kyphoplasty changes at L5. Diffuse vascular calcifications. Pelvic rim appears intact. SI joints and symphysis pubis are not displaced. Right hip appears intact without evidence of acute fracture or dislocation.  IMPRESSION: Degenerative changes in the lower lumbar spine and hips. No acute bony abnormalities appreciated.   Electronically Signed   By: Lucienne Capers M.D.   On: 11/08/2013 02:11   Dg Knee 2 Views Right  11/08/2013   CLINICAL DATA:  Right patellar pain after a fall this morning.  EXAM: RIGHT KNEE - 1-2 VIEW  COMPARISON:  None.  FINDINGS: Tricompartment degenerative changes in the right  knee. Chondrocalcinosis. Extensive vascular calcifications. Nonspecific soft tissue calcification in the distal aspect of the thigh probably  representing dystrophic muscular calcification or venous malformation. No evidence of acute fracture or dislocation of the right knee. No significant effusion.  IMPRESSION: Degenerative changes in the right knee. No acute bony abnormalities suggested.   Electronically Signed   By: Lucienne Capers M.D.   On: 11/08/2013 02:12   Ct Head Wo Contrast  11/08/2013   CLINICAL DATA:  Wound fell, striking right-sided head. History of multiple myeloma.  EXAM: CT HEAD WITHOUT CONTRAST  CT CERVICAL SPINE WITHOUT CONTRAST  TECHNIQUE: Multidetector CT imaging of the head and cervical spine was performed following the standard protocol without intravenous contrast. Multiplanar CT image reconstructions of the cervical spine were also generated.  COMPARISON:  MRI brain 07/15/2012. CT neck 07/12/2012. CT head 05/04/2012.  FINDINGS: CT HEAD FINDINGS  Minimal cerebral atrophy. No ventricular dilatation. Mild patchy white matter changes. No mass effect or midline shift. No abnormal extra-axial fluid collections. Gray-white matter junctions are distinct. Basal cisterns are not effaced. No evidence of acute intracranial hemorrhage. No depressed skull fractures. Visualized paranasal sinuses and mastoid air cells are not opacified. Subcutaneous soft tissue swelling over the right anterior frontal region and left parietal regions. Focal poorly defined lucency in the right anterior calvarium extending to the inner table likely representing changes of multiple myeloma. Diffuse demineralization of the skull. Vascular calcifications.  CT CERVICAL SPINE FINDINGS  Diffuse bone demineralization. Degenerative changes throughout the cervical spine with narrowed cervical interspaces and associated endplate hypertrophic changes. Mild retrolisthesis of C3 on C4 and mild anterolisthesis of C4 on C5 are likely  degenerative. Alignment is unchanged since the prior study. Heterogeneous marrow signal intensity changes likely related to multiple myeloma. C1-2 articulation appears intact. No large destructive bone lesions are appreciated. No vertebral compression deformities. No prevertebral soft tissue swelling. Extensive vascular calcifications in the thoracic aorta.  IMPRESSION: No acute intracranial abnormalities. Poorly defined lucent lesion in the right calvarium probably represents multiple myeloma change.  Diffuse bone demineralization with heterogeneous appearance possibly related to multiple myeloma. Degenerative changes. No displaced fractures identified.   Electronically Signed   By: Lucienne Capers M.D.   On: 11/08/2013 06:18   Ct Cervical Spine Wo Contrast  11/08/2013   CLINICAL DATA:  Wound fell, striking right-sided head. History of multiple myeloma.  EXAM: CT HEAD WITHOUT CONTRAST  CT CERVICAL SPINE WITHOUT CONTRAST  TECHNIQUE: Multidetector CT imaging of the head and cervical spine was performed following the standard protocol without intravenous contrast. Multiplanar CT image reconstructions of the cervical spine were also generated.  COMPARISON:  MRI brain 07/15/2012. CT neck 07/12/2012. CT head 05/04/2012.  FINDINGS: CT HEAD FINDINGS  Minimal cerebral atrophy. No ventricular dilatation. Mild patchy white matter changes. No mass effect or midline shift. No abnormal extra-axial fluid collections. Gray-white matter junctions are distinct. Basal cisterns are not effaced. No evidence of acute intracranial hemorrhage. No depressed skull fractures. Visualized paranasal sinuses and mastoid air cells are not opacified. Subcutaneous soft tissue swelling over the right anterior frontal region and left parietal regions. Focal poorly defined lucency in the right anterior calvarium extending to the inner table likely representing changes of multiple myeloma. Diffuse demineralization of the skull. Vascular  calcifications.  CT CERVICAL SPINE FINDINGS  Diffuse bone demineralization. Degenerative changes throughout the cervical spine with narrowed cervical interspaces and associated endplate hypertrophic changes. Mild retrolisthesis of C3 on C4 and mild anterolisthesis of C4 on C5 are likely degenerative. Alignment is unchanged since the prior study. Heterogeneous marrow signal intensity changes likely related to multiple myeloma. C1-2  articulation appears intact. No large destructive bone lesions are appreciated. No vertebral compression deformities. No prevertebral soft tissue swelling. Extensive vascular calcifications in the thoracic aorta.  IMPRESSION: No acute intracranial abnormalities. Poorly defined lucent lesion in the right calvarium probably represents multiple myeloma change.  Diffuse bone demineralization with heterogeneous appearance possibly related to multiple myeloma. Degenerative changes. No displaced fractures identified.   Electronically Signed   By: Lucienne Capers M.D.   On: 11/08/2013 06:18   Ct Hip Right Wo Contrast  11/08/2013   CLINICAL DATA:  [place] at midnight. Right hip pain. History of multiple myeloma.  EXAM: CT OF THE RIGHT HIP WITHOUT CONTRAST  TECHNIQUE: Multidetector CT imaging was performed according to the standard protocol. Multiplanar CT image reconstructions were also generated.  COMPARISON:  06/06/2013  FINDINGS: There is cortical irregularity involving the greater femoral trochanter consistent with nondisplaced fracture. Femoral neck appears intact. No dislocation of the right hip. Degenerative changes are present in the right hip. Degenerative changes in the sacroiliac and symphysis pubis. Diffuse bone demineralization. Kyphoplasty changes at L5 vertebra. Incidental note of an anterior pelvic wall hernia extending to the right inguinal region an containing bowel. This was present on previous comparison study. Posterior right bladder wall diverticulum also present previously.  Presumed injection granulomas in the subcutaneous soft tissues. Extensive vascular calcifications.  IMPRESSION: Nondisplaced fracture of the greater trochanter the right hip. Diffuse bone demineralization. Anterior pelvic wall/right inguinal hernia demonstrated.   Electronically Signed   By: Lucienne Capers M.D.   On: 11/08/2013 06:12   Dg Bone Survey Met  10/30/2013   CLINICAL DATA:  History multiple myeloma, recent development of lower back pain without trauma  EXAM: METASTATIC BONE SURVEY  COMPARISON:  Metastatic survey dated 11/01/2012  FINDINGS: The bones are osteopenic.  Focal area of lucency within the frontal region of the cranium. Finding has developed in the interim when compared to prior.  Multilevel spondylosis throughout the cervical, thoracic, and lumbar spine, stable. Stable areas of cement augmentation within the lower thoracic spine and lower lumbar spine. No appreciable destructive lytic lesions within the spine. There has been increase in the number of rib fractures on the left. These fractures have areas of callus, age indeterminate. No appreciable destructive lytic lesions. No appreciable rib fractures on the left.  Osteoarthritic changes are again appreciated within the hips, knees, right ankle. There is no evidence of aggressive appearing lytic lesions within the appendicular skeleton.  Diffuse advanced atherosclerotic calcifications appreciated.  IMPRESSION: Interval development of a lytic focus in the frontal region of the calvarium.  No further regions destructive appearing lytic lesions.  Stable diffuse spondylosis throughout the spine. Stable areas of cement augmentation within the thoracic and lumbar spine  Increase in the rib fractures on the left age indeterminate.  Advanced atherosclerotic changes  Multiple areas of advanced osteoarthritic changes.   Electronically Signed   By: Margaree Mackintosh M.D.   On: 10/30/2013 15:53    Microbiology: Recent Results (from the past 240  hour(s))  URINE CULTURE     Status: None   Collection Time    11/08/13  3:21 AM      Result Value Ref Range Status   Specimen Description URINE, RANDOM   Final   Special Requests NONE   Final   Culture  Setup Time     Final   Value: 11/10/2013 03:32     Performed at [facility]     Final   Value: >=  100,000 COLONIES/ML     Performed at [facility]     Final   Value: Multiple bacterial morphotypes present, none predominant. Suggest appropriate recollection if clinically indicated.     Performed at [facility]   Report Status 11/11/2013 FINAL   Final     Labs: Basic Metabolic Panel:  Recent Labs Lab 11/09/13 1516 11/10/13 0432 11/11/13 0524  NA 129* 132* 132*  K 4.0 3.8 3.7  CL 90* 92* 93*  CO2 26 26 27   GLUCOSE 157* 117* 133*  BUN 18 14 14   CREATININE 0.70 0.56 0.56  CALCIUM 9.1 8.8 8.8   Liver Function Tests:  Recent Labs Lab 11/11/13 0524  AST 11  ALT 7  ALKPHOS 40  BILITOT 0.3  PROT 6.6  ALBUMIN 2.1*   No results found for this basename: LIPASE, AMYLASE,  in the last 168 hours No results found for this basename: AMMONIA,  in the last 168 hours CBC:  Recent Labs Lab 11/06/13 0837 11/09/13 1516  WBC 4.5 7.6  NEUTROABS 3.3  --   HGB 9.1* 9.2*  HCT 28.7* 27.7*  MCV 100.7 96.9  PLT 151 181   Cardiac Enzymes: No results found for this basename: CKTOTAL, CKMB, CKMBINDEX, TROPONINI,  in the last 168 hours BNP: BNP (last 3 results)  Recent Labs  03/23/13 1036  PROBNP 2180.0*   CBG:  Recent Labs Lab 11/10/13 2117 11/11/13 0723 11/11/13 1207 11/11/13 1800 11/11/13 2212  GLUCAP 171* 150* 126* 125* 140*       Signed:  AKULA,VIJAYA  [facility] 11/12/2013, 11:13 AM

## 2013-11-12 NOTE — Progress Notes (Signed)
Physical Therapy Treatment Patient Details Name: Christina Hopkins MRN: [id number] DOB: April 15, 1926 Today's Date: 11/12/2013    History of Present Illness Christina Hopkins is a 80 y.o. female with prior h/o multiple myeloma, hypertension, diabetes mellitus, had a recent fall few days ago, she was brought in by her daughter 11/09/13 unable to bear weight on her R LE. She was found to have a right greater trochanter avulsion fracture. orthopedics consulted , Dr Renard Hamper man recommended weight bearing as tolerated and outpatient follow up In 2 weeks. She was discharged home 11/07/13 but came back as she could not bear the hip pain and her daughter could not manage her at home    PT Comments    Assisted pt OOB to [facility] + 2 assist then amb limited distance limited by MAX c/o R hip pain and inability to functionally WB.  Recliner brought to pt.  Increased time to position to comfort in recliner.  Follow Up Recommendations  SNF     Equipment Recommendations       Recommendations for Other Services       Precautions / Restrictions Precautions Precautions: Fall Restrictions Weight Bearing Restrictions: No RLE Weight Bearing: Weight bearing as tolerated Other Position/Activity Restrictions: WBAT per pt notes    Mobility  Bed Mobility Overal bed mobility: +2 for physical assistance             General bed mobility comments: increased time  Transfers Overall transfer level: Needs assistance Equipment used: Rolling walker (2 wheeled) Transfers: Sit to/from Stand Sit to Stand: +2 physical assistance;Mod assist         General transfer comment: cues for LE management and use of UEs to self assist.  Max c/o R hip pain  Ambulation/Gait Ambulation/Gait assistance: +2 physical assistance;Mod assist Ambulation Distance (Feet): 5 Feet (decreased amb distance due to increased c/o pain) Assistive device: Rolling walker (2 wheeled) Gait Pattern/deviations: Step-to pattern;Decreased stance time -  right;Trunk flexed;Narrow base of support Gait velocity: decr   General Gait Details: cues for sequence, posture, stride length and position from RW.  Self limiting WB thru R LE due to c/o 8/10 pain in R hip   Stairs            Wheelchair Mobility    Modified Rankin (Stroke Patients Only)       Balance                                    Cognition                            Exercises      General Comments        Pertinent Vitals/Pain C/o 8/10 pain despite pre medicated ICE applied    Home Living                      Prior Function            PT Goals (current goals can now be found in the care plan section) Progress towards PT goals: Progressing toward goals    Frequency  Min 3X/week    PT Plan      Co-evaluation             End of Session Equipment Utilized During Treatment: Gait belt Activity Tolerance: Patient limited by pain Patient left: in chair;with call  bell/phone within reach     Time: 1030-1055 PT Time Calculation (min): 25 min  Charges:  $Gait Training: 8-22 mins $Therapeutic Activity: 8-22 mins                    G Codes:      Rica Koyanagi  PTA [facility]  Acute  Rehab Pager      [phone]

## 2013-11-12 NOTE — Progress Notes (Signed)
RN called report to Animator at [facility].   All questions answered.   Patient transported via [facility] to SNF.

## 2013-11-13 ENCOUNTER — Other Ambulatory Visit: Payer: Self-pay | Admitting: *Deleted

## 2013-11-13 MED ORDER — HYDROCODONE-ACETAMINOPHEN 5-325 MG PO TABS: 1.0000 | ORAL_TABLET | ORAL | Status: DC | PRN

## 2013-11-13 NOTE — Progress Notes (Signed)
Discharge summary sent to payer through [facility]  

## 2013-11-14 ENCOUNTER — Telehealth: Payer: Self-pay

## 2013-11-14 NOTE — Telephone Encounter (Signed)
[name] called that mother at [employer]. She delivered pomalyst to [facility]. Called [facility] to confirm they are giving pt the pomalyst. Called [facility] about getting pomalyst delivered to [facility] or to [place] work. Gave [facility]ics mary's phone number per [facility] request.

## 2013-11-15 ENCOUNTER — Encounter: Payer: Self-pay | Admitting: Internal Medicine

## 2013-11-15 ENCOUNTER — Non-Acute Institutional Stay (SKILLED_NURSING_FACILITY): Payer: Medicare Other | Admitting: Internal Medicine

## 2013-11-15 DIAGNOSIS — N3000 Acute cystitis without hematuria: Secondary | ICD-10-CM

## 2013-11-15 DIAGNOSIS — I5042 Chronic combined systolic (congestive) and diastolic (congestive) heart failure: Secondary | ICD-10-CM

## 2013-11-15 DIAGNOSIS — S72111D Displaced fracture of greater trochanter of right femur, subsequent encounter for closed fracture with routine healing: Secondary | ICD-10-CM

## 2013-11-15 DIAGNOSIS — N390 Urinary tract infection, site not specified: Secondary | ICD-10-CM | POA: Insufficient documentation

## 2013-11-15 DIAGNOSIS — E871 Hypo-osmolality and hyponatremia: Secondary | ICD-10-CM

## 2013-11-15 DIAGNOSIS — S72009D Fracture of unspecified part of neck of unspecified femur, subsequent encounter for closed fracture with routine healing: Secondary | ICD-10-CM

## 2013-11-15 DIAGNOSIS — I509 Heart failure, unspecified: Secondary | ICD-10-CM

## 2013-11-15 DIAGNOSIS — D638 Anemia in other chronic diseases classified elsewhere: Secondary | ICD-10-CM

## 2013-11-15 DIAGNOSIS — C9000 Multiple myeloma not having achieved remission: Secondary | ICD-10-CM

## 2013-11-15 NOTE — Assessment & Plan Note (Signed)
on rocephin and urine cultures are added on SHOWED multiple bacterial morphotypes, will complete the course of the antibiotics with keflex for 5 more days

## 2013-11-15 NOTE — Assessment & Plan Note (Signed)
-- 

## 2013-11-15 NOTE — Assessment & Plan Note (Signed)
Stable;continue vitamin B 12

## 2013-11-15 NOTE — Assessment & Plan Note (Signed)
Pt on coreg and Lisinopril, no lasix;have just started sodium for hyponatremia;will monitor

## 2013-11-15 NOTE — Assessment & Plan Note (Signed)
pT/OT eval recommended SNF. Weight bearing as tolerated

## 2013-11-15 NOTE — Progress Notes (Signed)
MRN: [id number] Name: Christina Hopkins  Sex: female Age: 80 y.o. DOB: 17-Aug-1925  [place] #: [facility] Facility/Room: 103 Level Of Care: SNF Provider: Inocencio Homes D Emergency Contacts: Extended Emergency Contact Information Primary Emergency Contact: [facility] Address: [address]          [place], [place] [address] Johnnette Litter of [place] Phone: [phone] Mobile Phone: [phone] Relation: Daughter Secondary Emergency Contact: Renee Harder [place] of [place] Mobile Phone: [phone] Relation: Son  Code Status: DNR  Allergies: Gemfibrozil; Nifedipine; and Questran  Chief Complaint  Patient presents with  . nursing home admission    HPI: Patient is 80 y.o. female who is admitted for OT/PT after fall with hip fracture.  Past Medical History  Diagnosis Date  . Hypertension   . Diabetes mellitus   . Anemia   . Osteoporosis   . Dyslipidemia   . Myocardial infarction   . Coronary artery disease   . Shortness of breath   . GERD (gastroesophageal reflux disease)   . Wears glasses   . colon ca dx'd 2003    surg only  . Multiple myeloma dx'd 2009    oral chemo ongoing    Past Surgical History  Procedure Laterality Date  . Coronary artery bypass graft  08/2001    4 vessel  . Colon surgery  2003  . Tonsillectomy    . Appendectomy    . Abdominal hysterectomy    . Back surgery    . Eye surgery      cataracts  . Direct laryngoscopy with radiaesse injection Left 08/13/2012    Procedure: LEFT VOCAL CORD RADIESSE AUGMENTATION LARYNGOSCOPY ;  Surgeon: Jodi Marble, MD;  Location: [facility];  Service: ENT;  Laterality: Left;  . Colonoscopy N/A 06/13/2013    Procedure: COLONOSCOPY;  Surgeon: Jeryl Columbia, MD;  Location: [facility];  Service: Endoscopy;  Laterality: N/A;      Medication List       This list is accurate as of: 11/15/13  3:38 PM.  Always use your most recent med list.               acyclovir 400 MG tablet   Commonly known as:  ZOVIRAX  Take 400 mg by mouth 2 (two) times daily.     calcium carbonate 500 MG chewable tablet  Commonly known as:  TUMS - dosed in mg elemental calcium  Chew 1 tablet by mouth daily.     carvedilol 25 MG tablet  Commonly known as:  COREG  Take 25 mg by mouth 2 (two) times daily with a meal.     cephALEXin 500 MG capsule  Commonly known as:  KEFLEX  Take 1 capsule (500 mg total) by mouth 2 (two) times daily.     cholecalciferol 1000 UNITS tablet  Commonly known as:  VITAMIN D  Take 5,000 Units by mouth daily.     cyanocobalamin 1000 MCG/ML injection  Commonly known as:  (VITAMIN B-12)  Inject 1,000 mcg into the muscle every 30 (thirty) days.     cyclobenzaprine 10 MG tablet  Commonly known as:  FLEXERIL  Take 10 mg by mouth 3 (three) times daily as needed for muscle spasms.     dexamethasone 4 MG tablet  Commonly known as:  DECADRON  Take 20 mg by mouth once a week. On Wednesdays     HYDROcodone-acetaminophen 5-325 MG per tablet  Commonly known as:  NORCO/VICODIN  Take 1-2 tablets by mouth every 4 (  four) hours as needed for moderate pain.     lisinopril 2.5 MG tablet  Commonly known as:  PRINIVIL,ZESTRIL  Take 2.5 mg by mouth every other day.     loperamide 2 MG capsule  Commonly known as:  IMODIUM  Take 2 mg by mouth 3 (three) times daily as needed for diarrhea or loose stools.     magic mouthwash w/lidocaine Soln  Take 5 mLs by mouth 3 (three) times daily as needed for mouth pain.     magnesium oxide 400 MG tablet  Commonly known as:  MAG-OX  Take 1 tablet (400 mg total) by mouth 2 (two) times daily.     metFORMIN 500 MG tablet  Commonly known as:  GLUCOPHAGE  Take 500 mg by mouth 2 (two) times daily with a meal.     multivitamin with minerals Tabs tablet  Take 1 tablet by mouth daily.     pantoprazole 40 MG tablet  Commonly known as:  PROTONIX  Take 40 mg by mouth daily.     pomalidomide 2 MG capsule  Commonly known as:  POMALYST   Take 1 capsule (2 mg total) by mouth daily. Take with water on days 1-21. Repeat every 28 days.     prochlorperazine 10 MG tablet  Commonly known as:  COMPAZINE  Take 10 mg by mouth every 6 (six) hours as needed for nausea or vomiting.     SYSTANE 0.4-0.3 % Soln  Generic drug:  Polyethyl Glycol-Propyl Glycol  Place 1 drop into both eyes 3 (three) times daily as needed (dry eyes).     TIGER BALM PAIN RELIEVING 80-24-16 MG Ptch  Generic drug:  Camphor-Menthol-Capsicum  Apply 1 patch topically daily as needed (pain).        No orders of the defined types were placed in this encounter.    Immunization History  Administered Date(s) Administered  . Influenza-Unspecified 02/09/2011, 01/27/2012  . Pneumococcal Conjugate-13 02/22/2005    History  Substance Use Topics  . Smoking status: Never Smoker   . Smokeless tobacco: Never Used  . Alcohol Use: No    Family history is noncontributory    Review of Systems  DATA OBTAINED: from patient, GENERAL: Feels well no fevers, fatigue, appetite changes SKIN: No itching, rash or wounds EYES: No eye pain, redness, discharge EARS: No earache, tinnitus, change in hearing NOSE: No congestion, drainage or bleeding  MOUTH/THROAT: No mouth or tooth pain, No sore throat RESPIRATORY: No cough, wheezing, SOB CARDIAC: No chest pain, palpitations, lower extremity edema  GI: No abdominal pain, No N/V/D or constipation, No heartburn or reflux  GU: No dysuria, frequency or urgency, or incontinence  MUSCULOSKELETAL: only c/o is pain NEUROLOGIC: No headache, dizziness or focal weakness PSYCHIATRIC: No overt anxiety or sadness. Sleeps well. No behavior issue.   Filed Vitals:   11/15/13 1452  BP: 135/96  Pulse: 72  Temp: 96.8 F (36 C)  Resp: 20    Physical Exam  GENERAL APPEARANCE: Alert, conversant. Appropriately groomed. No acute distress.  SKIN: No diaphoresis rash HEAD: Normocephalic, atraumatic  EYES: Conjunctiva/lids clear. Pupils  round, reactive. EOMs intact.  EARS: External exam WNL, canals clear. Hearing grossly normal.  NOSE: No deformity or discharge.  MOUTH/THROAT: Lips w/o lesions. RESPIRATORY: Breathing is even, unlabored. Lung sounds are clear   CARDIOVASCULAR: Heart RRR no murmurs, rubs or gallops. No peripheral edema.   GASTROINTESTINAL: Abdomen is soft, non-tender, not distended w/ normal bowel sounds GENITOURINARY: Bladder non tender, not distended  MUSCULOSKELETAL: No  abnormal joints or musculature NEUROLOGIC:  Cranial nerves 2-12 grossly intact. Moves all extremities no tremor. PSYCHIATRIC: Mood and affect appropriate to situation, no behavioral issues  Patient Active Problem List   Diagnosis Date Noted  . UTI (urinary tract infection) 11/15/2013  . Greater trochanter fracture 11/09/2013  . Hip fracture 11/09/2013  . Abdominal pain, epigastric ventral hernia 06/12/2013  . Hiatal hernia 06/12/2013  . History of colon cancer 06/12/2013  . Chronic combined systolic and diastolic congestive heart failure 06/07/2013  . Hypokalemia 06/06/2013  . GI bleed 06/06/2013  . Acute GI bleeding 06/06/2013  . Hyperlipidemia 03/30/2013  . Hypomagnesemia 03/26/2013  . Thrombocytopenia, unspecified 03/24/2013  . Hyponatremia 03/24/2013  . Moderate protein-calorie malnutrition 03/24/2013  . Fall 03/23/2013  . Fracture of multiple ribs 03/23/2013  . PNA (pneumonia) 03/23/2013  . Magnesium deficiency 04/05/2011  . Type II or unspecified type diabetes mellitus without mention of complication, uncontrolled 04/05/2011  . Hypertension 04/05/2011  . Coronary artery disease with history of MI 04/05/2011  . Multiple myeloma 04/02/2011  . Anemia of chronic disease 04/02/2011    CBC    Component Value Date/Time   WBC 7.6 11/09/2013 1516   WBC 4.5 11/06/2013 0837   RBC 2.86* 11/09/2013 1516   RBC 2.85* 11/06/2013 0837   HGB 9.2* 11/09/2013 1516   HGB 9.1* 11/06/2013 0837   HCT 27.7* 11/09/2013 1516   HCT 28.7*  11/06/2013 0837   PLT 181 11/09/2013 1516   PLT 151 11/06/2013 0837   MCV 96.9 11/09/2013 1516   MCV 100.7 11/06/2013 0837   LYMPHSABS 0.7* 11/06/2013 0837   LYMPHSABS 0.9 06/06/2013 0957   MONOABS 0.5 11/06/2013 0837   MONOABS 0.4 06/06/2013 0957   EOSABS 0.1 11/06/2013 0837   EOSABS 0.0 06/06/2013 0957   BASOSABS 0.0 11/06/2013 0837   BASOSABS 0.0 06/06/2013 0957    CMP     Component Value Date/Time   NA 132* 11/11/2013 0524   NA 138 10/23/2013 0915   K 3.7 11/11/2013 0524   K 4.0 10/23/2013 0915   CL 93* 11/11/2013 0524   CL 101 10/18/2012 1051   CO2 27 11/11/2013 0524   CO2 29 10/23/2013 0915   GLUCOSE 133* 11/11/2013 0524   GLUCOSE 163* 10/23/2013 0915   GLUCOSE 153* 10/18/2012 1051   BUN 14 11/11/2013 0524   BUN 8.2 10/23/2013 0915   CREATININE 0.56 11/11/2013 0524   CREATININE 0.8 10/23/2013 0915   CREATININE 0.69 08/11/2009 1145   CALCIUM 8.8 11/11/2013 0524   CALCIUM 9.9 10/23/2013 0915   PROT 6.6 11/11/2013 0524   PROT 8.1 10/23/2013 0915   ALBUMIN 2.1* 11/11/2013 0524   ALBUMIN 3.0* 10/23/2013 0915   AST 11 11/11/2013 0524   AST 15 10/23/2013 0915   ALT 7 11/11/2013 0524   ALT 8 10/23/2013 0915   ALKPHOS 40 11/11/2013 0524   ALKPHOS 48 10/23/2013 0915   BILITOT 0.3 11/11/2013 0524   BILITOT 0.35 10/23/2013 0915   GFRNONAA 82* 11/11/2013 0524   GFRAA >90 11/11/2013 0524    Assessment and Plan  Greater trochanter fracture pT/OT eval recommended SNF. Weight bearing as tolerated   UTI (urinary tract infection) on rocephin and urine cultures are added on SHOWED multiple bacterial morphotypes, will complete the course of the antibiotics with keflex for 5 more days   Multiple myeloma resume home medications   Hyponatremia Appeared to be clearing in hospital with IVF but Na+ on 7/1 was 128 with BUN 15, Cr 0.5 so don't think  its a hydration issue; will start sodium chloride 1 gm BID.  Anemia of chronic disease Stable;continue vitamin B 12  Chronic combined systolic and diastolic congestive  heart failure Pt on coreg and Lisinopril, no lasix;have just started sodium for hyponatremia;will monitor  Type II or unspecified type diabetes mellitus without mention of complication, uncontrolled A1c is 6.5 on metformin and ACE    Hennie Duos, MD

## 2013-11-15 NOTE — Assessment & Plan Note (Addendum)
Appeared to be clearing in hospital with IVF but Na+ on 7/1 was 128 with BUN 15, Cr 0.5 so don't think its a hydration issue; will start sodium chloride 1 gm BID.

## 2013-11-15 NOTE — Assessment & Plan Note (Signed)
A1c is 6.5 on metformin and ACE

## 2013-11-19 ENCOUNTER — Other Ambulatory Visit: Payer: Self-pay | Admitting: *Deleted

## 2013-11-19 DIAGNOSIS — C9000 Multiple myeloma not having achieved remission: Secondary | ICD-10-CM

## 2013-11-19 NOTE — Telephone Encounter (Signed)
Refill request for Pomalyst to MD desk for review. Will be seen in office by PA on 11/20/13.

## 2013-11-20 ENCOUNTER — Ambulatory Visit (HOSPITAL_BASED_OUTPATIENT_CLINIC_OR_DEPARTMENT_OTHER): Payer: Medicare Other | Admitting: Physician Assistant

## 2013-11-20 ENCOUNTER — Ambulatory Visit (HOSPITAL_BASED_OUTPATIENT_CLINIC_OR_DEPARTMENT_OTHER): Payer: Medicare Other

## 2013-11-20 ENCOUNTER — Encounter: Payer: Self-pay | Admitting: Physician Assistant

## 2013-11-20 ENCOUNTER — Telehealth: Payer: Self-pay | Admitting: Internal Medicine

## 2013-11-20 ENCOUNTER — Other Ambulatory Visit (HOSPITAL_BASED_OUTPATIENT_CLINIC_OR_DEPARTMENT_OTHER): Payer: Medicare Other

## 2013-11-20 VITALS — BP 125/63 | HR 83 | Temp 97.2°F | Resp 18 | Ht <= 58 in | Wt 97.6 lb

## 2013-11-20 DIAGNOSIS — D638 Anemia in other chronic diseases classified elsewhere: Secondary | ICD-10-CM

## 2013-11-20 DIAGNOSIS — E538 Deficiency of other specified B group vitamins: Secondary | ICD-10-CM

## 2013-11-20 DIAGNOSIS — D649 Anemia, unspecified: Secondary | ICD-10-CM

## 2013-11-20 DIAGNOSIS — C9000 Multiple myeloma not having achieved remission: Secondary | ICD-10-CM

## 2013-11-20 DIAGNOSIS — E785 Hyperlipidemia, unspecified: Secondary | ICD-10-CM

## 2013-11-20 DIAGNOSIS — C9002 Multiple myeloma in relapse: Secondary | ICD-10-CM

## 2013-11-20 DIAGNOSIS — D509 Iron deficiency anemia, unspecified: Secondary | ICD-10-CM

## 2013-11-20 DIAGNOSIS — D519 Vitamin B12 deficiency anemia, unspecified: Secondary | ICD-10-CM

## 2013-11-20 LAB — CBC WITH DIFFERENTIAL/PLATELET
BASO%: 1.2 % (ref 0.0–2.0)
BASOS ABS: 0 10*3/uL (ref 0.0–0.1)
EOS ABS: 0.1 10*3/uL (ref 0.0–0.5)
EOS%: 1.7 % (ref 0.0–7.0)
HEMATOCRIT: 29 % — AB (ref 34.8–46.6)
HGB: 9.4 g/dL — ABNORMAL LOW (ref 11.6–15.9)
LYMPH%: 20.6 % (ref 14.0–49.7)
MCH: 31.8 pg (ref 25.1–34.0)
MCHC: 32.3 g/dL (ref 31.5–36.0)
MCV: 98.7 fL (ref 79.5–101.0)
MONO#: 0.5 10*3/uL (ref 0.1–0.9)
MONO%: 14.6 % — ABNORMAL HIGH (ref 0.0–14.0)
NEUT%: 61.9 % (ref 38.4–76.8)
NEUTROS ABS: 2.1 10*3/uL (ref 1.5–6.5)
PLATELETS: 175 10*3/uL (ref 145–400)
RBC: 2.94 10*6/uL — AB (ref 3.70–5.45)
RDW: 17.3 % — ABNORMAL HIGH (ref 11.2–14.5)
WBC: 3.3 10*3/uL — ABNORMAL LOW (ref 3.9–10.3)
lymph#: 0.7 10*3/uL — ABNORMAL LOW (ref 0.9–3.3)

## 2013-11-20 LAB — COMPREHENSIVE METABOLIC PANEL (CC13)
ALT: 15 U/L (ref 0–55)
AST: 13 U/L (ref 5–34)
Albumin: 2.5 g/dL — ABNORMAL LOW (ref 3.5–5.0)
Alkaline Phosphatase: 57 U/L (ref 40–150)
Anion Gap: 8 mEq/L (ref 3–11)
BILIRUBIN TOTAL: 0.37 mg/dL (ref 0.20–1.20)
BUN: 9.8 mg/dL (ref 7.0–26.0)
CO2: 31 meq/L — AB (ref 22–29)
Calcium: 9.3 mg/dL (ref 8.4–10.4)
Chloride: 96 mEq/L — ABNORMAL LOW (ref 98–109)
Creatinine: 0.6 mg/dL (ref 0.6–1.1)
GLUCOSE: 132 mg/dL (ref 70–140)
Potassium: 4 mEq/L (ref 3.5–5.1)
SODIUM: 135 meq/L — AB (ref 136–145)
TOTAL PROTEIN: 6.5 g/dL (ref 6.4–8.3)

## 2013-11-20 LAB — LACTATE DEHYDROGENASE (CC13): LDH: 111 U/L — ABNORMAL LOW (ref 125–245)

## 2013-11-20 MED ORDER — DARBEPOETIN ALFA-POLYSORBATE 500 MCG/ML IJ SOLN
300.0000 ug | Freq: Once | INTRAMUSCULAR | Status: AC
  Administered 2013-11-20: 300 ug via SUBCUTANEOUS
  Filled 2013-11-20: qty 1

## 2013-11-20 NOTE — Progress Notes (Signed)
[facility] OFFICE PROGRESS NOTE  [name], MD [address] [place] [address]  DIAGNOSIS: Multiple myeloma - Plan: CBC with Differential, Comprehensive metabolic panel (Cmet) - [facility], Lactate dehydrogenase (LDH) - [facility]  No chief complaint on file.  DIAGNOSIS:  1. Multiple myeloma.  2. Anemia secondary to vitamin B12 deficiency.   PAST THERAPY:  Revlimid (08/2009-08/2012)  Zometa (02/2010 - 02/2012)  Velcade (05/2011 -10/2013)  Cytoxan 08/2012 - 10/2013)  Decadron (08/2012 - 10/2013)   CURRENT THERAPY:  1. Pomalyst 2 mg by mouth daily for 21 days out of 28 days and Decadron 20 mg by mouth once weekly from 10/23/2013.  2. Vitamin B12 mg IM monthly.  3. Aranesp 300 mcg subcu every 2 weeks for hemoglobin less than or equal to 10.   Interim History: The patient is a pleasant 80 y.o. female who presented for follow up visit. She was last seen by Dr. Juliann Mule on 06/210/2015. Today, she is accompanied by her daughter [name].    She denies any hospitalizations or fevers or chills. She denies  focal neurological symptoms, headaches or visual changes, abdominal or urinary. Her appetite and weight is stable.   She reports occasional constipation and diarrhea however this has been a long-standing issue. Her appetite is fair. She is currently at the  recovering from a hip fracture. She continues to have pain related to the hip fracture. The patient denied fever, chills, night sweats, change in appetite or weight. She denied odynophagia or dysphagia. No chest pain, palpitations, abdominal pain, vomiting, constipation, hematochezia. The patient denied dysuria, nocturia, polyuria, hematuria, myalgia, tingling, psychiatric problems.   MEDICAL HISTORY: Past Medical History  Diagnosis Date  . Hypertension   . Diabetes mellitus   . Anemia   . Osteoporosis   . Dyslipidemia   . Myocardial infarction   . Coronary artery disease   .  Shortness of breath   . GERD (gastroesophageal reflux disease)   . Wears glasses   . colon ca dx'd 2003    surg only  . Multiple myeloma dx'd 2009    oral chemo ongoing    INTERIM HISTORY: has Multiple myeloma; Anemia of chronic disease; Magnesium deficiency; Type II or unspecified type diabetes mellitus without mention of complication, uncontrolled; Hypertension; Coronary artery disease with history of MI; Fall; Fracture of multiple ribs; PNA (pneumonia); Thrombocytopenia, unspecified; Hyponatremia; Moderate protein-calorie malnutrition; Hypomagnesemia; Hyperlipidemia; Hypokalemia; GI bleed; Acute GI bleeding; Chronic combined systolic and diastolic congestive heart failure; Abdominal pain, epigastric ventral hernia; Hiatal hernia; History of colon cancer; Greater trochanter fracture; Hip fracture; and UTI (urinary tract infection) on her problem list.    ALLERGIES:  is allergic to gemfibrozil; nifedipine; and questran.  MEDICATIONS: has a current medication list which includes the following prescription(s): acyclovir, magic mouthwash w/lidocaine, calcium carbonate, camphor-menthol-capsicum, carvedilol, cholecalciferol, cyanocobalamin, cyclobenzaprine, dexamethasone, hydrocodone-acetaminophen, lisinopril, loperamide, magnesium oxide, metformin, multivitamin with minerals, pantoprazole, polyethyl glycol-propyl glycol, pomalidomide, cephalexin, and prochlorperazine, and the following Facility-Administered Medications: darbepoetin.  SURGICAL HISTORY:  Past Surgical History  Procedure Laterality Date  . Coronary artery bypass graft  08/2001    4 vessel  . Colon surgery  2003  . Tonsillectomy    . Appendectomy    . Abdominal hysterectomy    . Back surgery    . Eye surgery      cataracts  . Direct laryngoscopy with radiaesse injection Left 08/13/2012    Procedure: LEFT VOCAL CORD RADIESSE AUGMENTATION LARYNGOSCOPY ;  Surgeon:  Jodi Marble, MD;  Location: [facility];  Service:  ENT;  Laterality: Left;  . Colonoscopy N/A 06/13/2013    Procedure: COLONOSCOPY;  Surgeon: Jeryl Columbia, MD;  Location: [facility];  Service: Endoscopy;  Laterality: N/A;   Problem List:  1. Multiple myeloma, initially presenting as an IgA kappa monoclonal gammopathy in February 2009. 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 This was felt to be an adverse prognostic determinant. Bone marrow on 08/31/2007 showed 46% plasma cells. A repeat bone marrow on 08/05/2009 showed 73% plasma cells. Treatment with Revlimid was started in April 2011. Zometa was started in October 2011 and 2 years of treatment were concluded on 02/23/2012. We had previously started Aranesp for the patient's anemia in May of 2010. Most recent metastatic bone survey was on 08/05/2009. Other x-rays since then are available. Maximum IgA level was 2080 on 08/11/2009. Subcutaneous Velcade was added to the patient's treatment program on 06/14/2011 because of a rising IgA level 1080 on 06/02/2011. The patient had been receiving Velcade every 2 weeks in combination with Revlimid during the early months of 2014. The patient continues to demonstrate progressive disease as evidenced by rising IgA and serum kappa levels. Revlimid was discontinued in April 2014. The patient had received Revlimid for about 3 years from April 2011 through April 2014. As of 08/29/2012, the patient is now receiving Velcade, Cytoxan and Decadron every 2 weeks. Discontinued secondary to evidence for disease progression. The patient has also been receiving Aranesp 300 mcg subcu every 2 weeks for hemoglobin less than or equal to 10.  2. Anemia secondary to vitamin B12 deficiency. The patient had been on oral vitamin B12. However, her vitamin B12 level fell to 284 on 07/26/2011 and vitamin B12 shots 1000 mcg given IM every month was resumed on 09/06/2011.  3. Anemia secondary to iron deficiency.   REVIEW OF SYSTEMS:   Constitutional: Denies fevers, chills or abnormal  weight loss Eyes: Denies blurriness of vision Ears, nose, mouth, throat, and face: Denies mucositis or sore throat Respiratory: Denies cough, dyspnea or wheezes Cardiovascular: Denies palpitation, chest discomfort or lower extremity swelling Gastrointestinal:  Denies nausea, heartburn;  positive for constipation Skin: Denies abnormal skin rashes Lymphatics: Denies new lymphadenopathy or easy bruising Neurological:Denies numbness, tingling or new weaknesses Behavioral/Psych: Mood is stable, no new changes  All other systems were reviewed with the patient and are negative.  PHYSICAL EXAMINATION: ECOG PERFORMANCE STATUS: 1 - Symptomatic but completely ambulatory  Blood pressure 125/63, pulse 83, temperature 97.2 F (36.2 C), temperature source Oral, resp. rate 18, height 4' 8"  (1.422 m), weight 97 lb 9.6 oz (44.271 kg).  GENERAL:alert, no distress and comfortable; Diminished hearing; Kyphotic SKIN: skin color, texture, turgor are normal, no rashes or significant lesions  EYES: normal, Conjunctiva are pink and non-injected, sclera clear OROPHARYNX:no exudate, no erythema and lips, buccal mucosa, and tongue normal  NECK: supple, thyroid normal size, non-tender, without nodularity LYMPH:  no palpable lymphadenopathy in the cervical, axillary or supraclavicular LUNGS: clear to auscultation and percussion with normal breathing effort HEART: regular rate & rhythm and no murmurs and no lower extremity edema ABDOMEN:abdomen soft, non-tender and normal bowel sounds Musculoskeletal:no cyanosis of digits and no clubbing  NEURO: alert & oriented x 3 with fluent speech, no focal motor/sensory deficits; cautious gait with a walker.   Lab Results  Component Value Date   WBC 3.3* 11/20/2013   HGB 9.4* 11/20/2013   HCT 29.0* 11/20/2013   MCV 98.7 11/20/2013  PLT 175 11/20/2013   NEUTROABS 2.1 11/20/2013      Chemistry      Component Value Date/Time   NA 135* 11/20/2013 0820   NA 132* 11/11/2013 0524   K  4.0 11/20/2013 0820   K 3.7 11/11/2013 0524   CL 93* 11/11/2013 0524   CL 101 10/18/2012 1051   CO2 31* 11/20/2013 0820   CO2 27 11/11/2013 0524   BUN 9.8 11/20/2013 0820   BUN 14 11/11/2013 0524   CREATININE 0.6 11/20/2013 0820   CREATININE 0.56 11/11/2013 0524   CREATININE 0.69 08/11/2009 1145      Component Value Date/Time   CALCIUM 9.3 11/20/2013 0820   CALCIUM 8.8 11/11/2013 0524   ALKPHOS 57 11/20/2013 0820   ALKPHOS 40 11/11/2013 0524   AST 13 11/20/2013 0820   AST 11 11/11/2013 0524   ALT 15 11/20/2013 0820   ALT 7 11/11/2013 0524   BILITOT 0.37 11/20/2013 0820   BILITOT 0.3 11/11/2013 0524     RADIOGRAPHIC STUDIES: None  ASSESSMENT: Christina Hopkins 80 y.o. female with a history of Multiple myeloma - Plan: CBC with Differential, Comprehensive metabolic panel (Cmet) - [facility], Lactate dehydrogenase (LDH) - [facility]   PLAN:  1. Multiple myeloma, progressive. --Her M-spike has increased and her IgA level is now 2270 up1060 2 months ago.  We will change her chemotherapy based on progression.  She is currently receiving pomalyst treatment given she received at least 2 prior therapies (including lenalidomide and bortezomib) and have demonstrated disease progression on or within 60 days of completion of the last therapy. We stopped Velcade, cytoxan and changed to pomalyst 2 mg daily for 21 days, then off for 7 days in combination with her dexamethasone. She will continue on aspirin to reduce her risk of clots. Thus far she is tolerating the treament with pomalyst and dexamethasone without difficulty.  If her MM progresses despite this treatment, we will consider carfilzomib-based treatment.   --We will follow SPEP plus IFE, UPEP plus IFE and Kappa Lambda light chains while on therapy.   We will obtain an annual bone survey.   2. Right greater trochanteric avulsion fracture --She is currently at a [facility] facility as she recuperates from her hip fracture.    3. Follow up. RTC in 3 weeks for  evaluation for another treatment with CBC, CMET.  CBC in 2 weeks.   All questions were answered. The patient knows to call the clinic with any problems, questions or concerns. We can certainly see the patient much sooner if necessary.  I spent 25 minutes counseling the patient face to face. The total time spent in the appointment was 35 minutes.    Carlton Adam, PA-C 11/20/2013 2:35 PM

## 2013-11-20 NOTE — Telephone Encounter (Signed)
gv adn printed appt sched and avs for pt for July  °

## 2013-11-21 MED ORDER — POMALIDOMIDE 2 MG PO CAPS: 2.0000 mg | ORAL_CAPSULE | Freq: Every day | ORAL | Status: DC

## 2013-11-21 NOTE — Addendum Note (Signed)
Addended by: Wyonia Hough on: 11/21/2013 02:54 PM   Modules accepted: Orders

## 2013-11-22 LAB — KAPPA/LAMBDA LIGHT CHAINS
KAPPA FREE LGHT CHN: 59 mg/dL — AB (ref 0.33–1.94)
Kappa:Lambda Ratio: 63.44 — ABNORMAL HIGH (ref 0.26–1.65)
Lambda Free Lght Chn: 0.93 mg/dL (ref 0.57–2.63)

## 2013-11-22 LAB — SPEP & IFE WITH QIG
ALPHA-2-GLOBULIN: 13.2 % — AB (ref 7.1–11.8)
Albumin ELP: 41.7 % — ABNORMAL LOW (ref 55.8–66.1)
Alpha-1-Globulin: 13.8 % — ABNORMAL HIGH (ref 2.9–4.9)
BETA GLOBULIN: 6.1 % (ref 4.7–7.2)
Beta 2: 20.2 % — ABNORMAL HIGH (ref 3.2–6.5)
Gamma Globulin: 5 % — ABNORMAL LOW (ref 11.1–18.8)
IGG (IMMUNOGLOBIN G), SERUM: 349 mg/dL — AB (ref 690–1700)
IgA: 1370 mg/dL — ABNORMAL HIGH (ref 69–380)
IgM, Serum: 12 mg/dL — ABNORMAL LOW (ref 52–322)
M-SPIKE, %: 0.91 g/dL
Total Protein, Serum Electrophoresis: 6.1 g/dL (ref 6.0–8.3)

## 2013-11-22 NOTE — Patient Instructions (Signed)
Complete your current course of pomalyst. Take 20 mg of dexamethason once a week, every week Follow up with Dr. Juliann Mule in 3 weeks for another symptom management visit.

## 2013-11-25 ENCOUNTER — Telehealth: Payer: Self-pay | Admitting: Medical Oncology

## 2013-11-25 ENCOUNTER — Other Ambulatory Visit: Payer: Self-pay | Admitting: *Deleted

## 2013-11-25 MED ORDER — HYDROCODONE-ACETAMINOPHEN 5-325 MG PO TABS: ORAL_TABLET | ORAL | Status: DC

## 2013-11-25 NOTE — Telephone Encounter (Signed)
[facility] 

## 2013-11-25 NOTE — Telephone Encounter (Signed)
[name] called and left a message asking if we could fax directions on how patient needs to take pomalyst. She is on her break this week and she will need to restart Sat. 11/30/13. She spoke with the nurse Randell Patient and she requested we fax a note. I faxed a note stating pt take pomalyst 2 mg days 1-21. She needs to restart next cycle 11/30/13.

## 2013-11-28 ENCOUNTER — Other Ambulatory Visit: Payer: Self-pay | Admitting: *Deleted

## 2013-11-28 MED ORDER — HYDROCODONE-ACETAMINOPHEN 5-325 MG PO TABS: ORAL_TABLET | ORAL | Status: DC

## 2013-11-28 NOTE — Telephone Encounter (Signed)
[facility] 

## 2013-12-11 ENCOUNTER — Other Ambulatory Visit (HOSPITAL_BASED_OUTPATIENT_CLINIC_OR_DEPARTMENT_OTHER): Payer: Medicare Other

## 2013-12-11 ENCOUNTER — Ambulatory Visit (HOSPITAL_BASED_OUTPATIENT_CLINIC_OR_DEPARTMENT_OTHER): Payer: Medicare Other | Admitting: Internal Medicine

## 2013-12-11 ENCOUNTER — Ambulatory Visit (HOSPITAL_BASED_OUTPATIENT_CLINIC_OR_DEPARTMENT_OTHER): Payer: Medicare Other

## 2013-12-11 ENCOUNTER — Other Ambulatory Visit: Payer: Self-pay | Admitting: Internal Medicine

## 2013-12-11 ENCOUNTER — Telehealth: Payer: Self-pay | Admitting: Internal Medicine

## 2013-12-11 VITALS — BP 123/45 | HR 76 | Temp 98.2°F | Resp 18 | Ht <= 58 in | Wt 94.2 lb

## 2013-12-11 DIAGNOSIS — D630 Anemia in neoplastic disease: Secondary | ICD-10-CM

## 2013-12-11 DIAGNOSIS — C9000 Multiple myeloma not having achieved remission: Secondary | ICD-10-CM

## 2013-12-11 DIAGNOSIS — E876 Hypokalemia: Secondary | ICD-10-CM

## 2013-12-11 DIAGNOSIS — D72819 Decreased white blood cell count, unspecified: Secondary | ICD-10-CM

## 2013-12-11 DIAGNOSIS — D519 Vitamin B12 deficiency anemia, unspecified: Secondary | ICD-10-CM

## 2013-12-11 DIAGNOSIS — E538 Deficiency of other specified B group vitamins: Secondary | ICD-10-CM

## 2013-12-11 DIAGNOSIS — D638 Anemia in other chronic diseases classified elsewhere: Secondary | ICD-10-CM

## 2013-12-11 DIAGNOSIS — D509 Iron deficiency anemia, unspecified: Secondary | ICD-10-CM

## 2013-12-11 LAB — COMPREHENSIVE METABOLIC PANEL (CC13)
ALBUMIN: 2.6 g/dL — AB (ref 3.5–5.0)
ALT: <6 U/L (ref 0–55)
AST: 11 U/L (ref 5–34)
Alkaline Phosphatase: 64 U/L (ref 40–150)
Anion Gap: 8 mEq/L (ref 3–11)
BUN: 8.5 mg/dL (ref 7.0–26.0)
CHLORIDE: 97 meq/L — AB (ref 98–109)
CO2: 30 mEq/L — ABNORMAL HIGH (ref 22–29)
Calcium: 8.8 mg/dL (ref 8.4–10.4)
Creatinine: 0.6 mg/dL (ref 0.6–1.1)
GLUCOSE: 151 mg/dL — AB (ref 70–140)
POTASSIUM: 3.3 meq/L — AB (ref 3.5–5.1)
Sodium: 135 mEq/L — ABNORMAL LOW (ref 136–145)
TOTAL PROTEIN: 6 g/dL — AB (ref 6.4–8.3)
Total Bilirubin: 0.5 mg/dL (ref 0.20–1.20)

## 2013-12-11 LAB — CBC WITH DIFFERENTIAL/PLATELET
BASO%: 2.6 % — ABNORMAL HIGH (ref 0.0–2.0)
Basophils Absolute: 0.1 10*3/uL (ref 0.0–0.1)
EOS ABS: 0.1 10*3/uL (ref 0.0–0.5)
EOS%: 3.3 % (ref 0.0–7.0)
HCT: 31.4 % — ABNORMAL LOW (ref 34.8–46.6)
HGB: 9.9 g/dL — ABNORMAL LOW (ref 11.6–15.9)
LYMPH#: 0.8 10*3/uL — AB (ref 0.9–3.3)
LYMPH%: 29.9 % (ref 14.0–49.7)
MCH: 30.2 pg (ref 25.1–34.0)
MCHC: 31.6 g/dL (ref 31.5–36.0)
MCV: 95.5 fL (ref 79.5–101.0)
MONO#: 0.3 10*3/uL (ref 0.1–0.9)
MONO%: 11.4 % (ref 0.0–14.0)
NEUT%: 52.8 % (ref 38.4–76.8)
NEUTROS ABS: 1.5 10*3/uL (ref 1.5–6.5)
PLATELETS: 204 10*3/uL (ref 145–400)
RBC: 3.29 10*6/uL — AB (ref 3.70–5.45)
RDW: 17.9 % — ABNORMAL HIGH (ref 11.2–14.5)
WBC: 2.8 10*3/uL — AB (ref 3.9–10.3)

## 2013-12-11 LAB — LACTATE DEHYDROGENASE (CC13): LDH: 138 U/L (ref 125–245)

## 2013-12-11 MED ORDER — CYANOCOBALAMIN 1000 MCG/ML IJ SOLN: 1000.0000 ug | Freq: Once | INTRAMUSCULAR | Status: DC

## 2013-12-11 MED ORDER — DARBEPOETIN ALFA-POLYSORBATE 300 MCG/0.6ML IJ SOLN
300.0000 ug | Freq: Once | INTRAMUSCULAR | Status: AC
  Administered 2013-12-11: 300 ug via SUBCUTANEOUS
  Filled 2013-12-11: qty 0.6

## 2013-12-11 NOTE — Patient Instructions (Signed)
Dexamethasone tablets What is this medicine? DEXAMETHASONE (dex a METH a sone) is a corticosteroid. It is commonly used to treat inflammation of the skin, joints, lungs, and other organs. Common conditions treated include asthma, allergies, and arthritis. It is also used for other conditions, such as blood disorders and diseases of the adrenal glands. This medicine may be used for other purposes; ask your health care provider or pharmacist if you have questions. COMMON BRAND NAME(S): Decadron, DexPak Sterling Big, DexPak TaperPak, Zema-Pak What should I tell my health care provider before I take this medicine? They need to know if you have any of these conditions: -Cushing's syndrome -diabetes -glaucoma -heart problems or disease -high blood pressure -infection like herpes, measles, tuberculosis, or chickenpox -kidney disease -liver disease -mental problems -myasthenia gravis -osteoporosis -previous heart attack -seizures -stomach, ulcer or intestine disease including colitis and diverticulitis -thyroid problem -an unusual or allergic reaction to dexamethasone, corticosteroids, other medicines, lactose, foods, dyes, or preservatives -pregnant or trying to get pregnant -breast-feeding How should I use this medicine? Take this medicine by mouth with a drink of water. Follow the directions on the prescription label. Take it with food or milk to avoid stomach upset. If you are taking this medicine once a day, take it in the morning. Do not take more medicine than you are told to take. Do not suddenly stop taking your medicine because you may develop a severe reaction. Your doctor will tell you how much medicine to take. If your doctor wants you to stop the medicine, the dose may be slowly lowered over time to avoid any side effects. Talk to your pediatrician regarding the use of this medicine in children. Special care may be needed. Patients over 27 years old may have a stronger reaction and  need a smaller dose. Overdosage: If you think you have taken too much of this medicine contact a poison control center or emergency room at once. NOTE: This medicine is only for you. Do not share this medicine with others. What if I miss a dose? If you miss a dose, take it as soon as you can. If it is almost time for your next dose, talk to your doctor or health care professional. You may need to miss a dose or take an extra dose. Do not take double or extra doses without advice. What may interact with this medicine? Do not take this medicine with any of the following medications: -mifepristone, RU-486 -vaccines This medicine may also interact with the following medications: -amphotericin B -antibiotics like clarithromycin, erythromycin, and troleandomycin -aspirin and aspirin-like drugs -barbiturates like phenobarbital -carbamazepine -cholestyramine -cholinesterase inhibitors like donepezil, galantamine, rivastigmine, and tacrine -cyclosporine -digoxin -diuretics -ephedrine -female hormones, like estrogens or progestins and birth control pills -indinavir -isoniazid -ketoconazole -medicines for diabetes -medicines that improve muscle tone or strength for conditions like myasthenia gravis -NSAIDs, medicines for pain and inflammation, like ibuprofen or naproxen -phenytoin -rifampin -thalidomide -warfarin This list may not describe all possible interactions. Give your health care provider a list of all the medicines, herbs, non-prescription drugs, or dietary supplements you use. Also tell them if you smoke, drink alcohol, or use illegal drugs. Some items may interact with your medicine. What should I watch for while using this medicine? Visit your doctor or health care professional for regular checks on your progress. If you are taking this medicine over a prolonged period, carry an identification card with your name and address, the type and dose of your medicine, and your doctor's  name  and address. This medicine may increase your risk of getting an infection. Stay away from people who are sick. Tell your doctor or health care professional if you are around anyone with measles or chickenpox. If you are going to have surgery, tell your doctor or health care professional that you have taken this medicine within the last twelve months. Ask your doctor or health care professional about your diet. You may need to lower the amount of salt you eat. The medicine can increase your blood sugar. If you are a diabetic check with your doctor if you need help adjusting the dose of your diabetic medicine. What side effects may I notice from receiving this medicine? Side effects that you should report to your doctor or health care professional as soon as possible: -allergic reactions like skin rash, itching or hives, swelling of the face, lips, or tongue -changes in vision -fever, sore throat, sneezing, cough, or other signs of infection, wounds that will not heal -increased thirst -mental depression, mood swings, mistaken feelings of self importance or of being mistreated -pain in hips, back, ribs, arms, shoulders, or legs -redness, blistering, peeling or loosening of the skin, including inside the mouth -trouble passing urine or change in the amount of urine -swelling of feet or lower legs -unusual bleeding or bruising Side effects that usually do not require medical attention (report to your doctor or health care professional if they continue or are bothersome): -headache -nausea, vomiting -skin problems, acne, thin and shiny skin -weight gain This list may not describe all possible side effects. Call your doctor for medical advice about side effects. You may report side effects to FDA at 1-800-FDA-1088. Where should I keep my medicine? Keep out of the reach of children. Store at room temperature between 20 and 25 degrees C (68 and 77 degrees F). Protect from light. Throw away any  unused medicine after the expiration date. NOTE: This sheet is a summary. It may not cover all possible information. If you have questions about this medicine, talk to your doctor, pharmacist, or health care provider.  2015, [employer]. (2007-08-23 14:02:13) Pomalidomide oral capsules What is this medicine? POMALIDOMIDE (pom a LID oh mide) is a chemotherapy drug used to treat multiple myeloma. It targets specific proteins within cancer cells and stops the cancer cell from growing. This medicine may be used for other purposes; ask your health care provider or pharmacist if you have questions. COMMON BRAND NAME(S): POMALYST What should I tell my health care provider before I take this medicine? They need to know if you have any of these conditions: -high blood pressure -high cholesterol -history of blood clots -irregular monthly periods or menstrual cycles -smoke tobacco -an unusual or allergic reaction to pomalidomide, other medicines, foods, dyes, or preservatives -pregnant or trying to get pregnant -breast-feeding How should I use this medicine? Take this medicine by mouth with a glass of water. Follow the directions on the prescription label. Take this medicine on an empty stomach, at least 2 hours before or 2 hours after a meal. Do not take with food. Do not cut, crush, or chew this medicine. Take your medicine at regular intervals. Do not take it more often than directed. Do not stop taking except on your doctor's advice. A special MedGuide will be given to you by the pharmacist with each prescription and refill. Be sure to read this information carefully each time. Talk to your pediatrician regarding the use of this medicine in children. Special care may be   needed. Overdosage: If you think you've taken too much of this medicine contact a poison control center or emergency room at once. Overdosage: If you think you have taken too much of this medicine contact a poison control  center or emergency room at once. NOTE: This medicine is only for you. Do not share this medicine with others. What if I miss a dose? If you miss a dose, take it as soon as you can. If your next dose is to be taken in less than 12 hours, then do not take the missed dose. Take the next dose at your regular time. Do not take double or extra doses. What may interact with this medicine? This medicine may interact with the following medications: -amprenavir -boceprevir -carbamazepine -dalfopristin; quinupristin -delavirdine -enzalutamide -fosamprenavir -indinavir -isoniazid, INH -itraconazole -ketoconazole -nicardipine -rifampin -ritonavir -St. John's Wort, Hypericum perforatum -telaprevir -telithromycin -thiabendazole -tipranavir -tobacco (cigarettes) This list may not describe all possible interactions. Give your health care provider a list of all the medicines, herbs, non-prescription drugs, or dietary supplements you use. Also tell them if you smoke, drink alcohol, or use illegal drugs. Some items may interact with your medicine. What should I watch for while using this medicine? This drug may make you feel generally unwell. This is not uncommon, as chemotherapy can affect healthy cells as well as cancer cells. Report any side effects. Continue your course of treatment even though you feel ill unless your doctor tells you to stop. This medicine is available only through a special program. Doctors, pharmacies, and patients must meet all of the conditions of the program. Your health care provider will help you get signed up with the program if you need this medicine. Through the program you will only receive up to a 28 day supply of the medicine at one time. You will need a new prescription for each refill. This medicine can cause birth defects. Do not get pregnant while taking this drug. Females with child-bearing potential will need to have 2 negative pregnancy tests before starting this  medicine. Pregnancy testing must be done every 2 to 4 weeks as directed while taking this medicine. Use 2 reliable forms of birth control together while you are taking this medicine and for 1 month after you stop taking this medicine. If you think that you might be pregnant talk to your doctor right away. Men must use a latex condom during sexual contact with a woman while taking this medicine and for 28 days after you stop taking this medicine. A latex condom is needed even if you have had a vasectomy. Contact your doctor right away if your partner becomes pregnant. Do not donate sperm while taking this medicine and for 28 days after you stop taking this medicine. Do not give blood while taking the medicine and for 1 month after completion of treatment to avoid exposing pregnant women to the medicine through the donated blood. You may need blood work done while you are taking this medicine. Talk to your doctor about your risk of cancer. You may be more at risk for certain types of cancers if you take this medicine. What side effects may I notice from receiving this medicine? Side effects that you should report to your doctor or health care professional as soon as possible: -allergic reactions like skin rash, itching or hives, swelling of the face, lips, or tongue -low blood counts - this medicine may decrease the number of white blood cells, red blood cells and platelets. You may be   at increased risk for infections and bleeding -signs and symptoms of a blood clot such as breathing problems; changes in vision; chest pain; severe, sudden headache; pain, swelling, warmth in the leg; trouble speaking; sudden numbness or weakness of the face, arm or leg -signs and symptoms of liver injury like dark yellow or [place] urine; general ill feeling or flu-like symptoms; light-colored stools; loss of appetite; nausea; right upper belly pain; unusually weak or tired; yellowing of the eyes or skin -signs and symptoms of  a stroke like changes in vision; confusion; trouble speaking or understanding; severe headaches; sudden numbness or weakness of the face, arm or leg; trouble walking; dizziness; loss of balance or coordination -sweating -tingling, numbness in the hands or feet -unusual bleeding or bruising Side effects that usually do not require medical attention (Report these to your doctor or health care professional if they continue or are bothersome.): -back pain -constipation -diarrhea -nausea -tiredness This list may not describe all possible side effects. Call your doctor for medical advice about side effects. You may report side effects to FDA at 1-800-FDA-1088. Where should I keep my medicine? Keep out of the reach of children. Store between 20 and 25 degrees C (68 and 77 degrees F). Throw away any unused medicine after the expiration date. NOTE: This sheet is a summary. It may not cover all possible information. If you have questions about this medicine, talk to your doctor, pharmacist, or health care provider.  2015, [employer]. (2013-09-10 13:26:59)  

## 2013-12-11 NOTE — Progress Notes (Signed)
[facility] OFFICE PROGRESS NOTE  Christina Frees, MD [address] [place] [address]  DIAGNOSIS: Multiple myeloma - Plan: CBC with Differential, Comprehensive metabolic panel (Cmet) - [facility], Lactate dehydrogenase (LDH) - [facility], Kappa/lambda light chains, IgG, IgA, IgM  Chief Complaint  Patient presents with  . Follow-up   DIAGNOSIS:  1. Multiple myeloma.  2. Anemia secondary to vitamin B12 deficiency.   PAST THERAPY:  Revlimid (08/2009-08/2012)  Zometa (02/2010 - 02/2012)  Velcade (05/2011 -10/09/2013)  Cytoxan 08/2012 - 10/09/2013)  Decadron (08/2012 - present)   CURRENT THERAPY:  1. Pomalyst 2 mg daily for 1 - 21 days and then off 7 days started on 10/25/2013.  2. Decadron 20 mg weekly (every Wednesday) 2. Vitamin B12 mg IM monthly.  3. Aranesp 300 mcg subcu every 2 weeks for hemoglobin less than or equal to 10.   Interim History: The patient is a pleasant 80 y.o. female who presented for follow up visit. She was last seen by Awilda Metro, PA on 11/20/2013. Today, she is accompanied by her daughter Christina [place].  She continues rehab for a right greater trochanter frx (on observation, not felt to require surgical intervention).  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  She restarted cycle #2 of her pomalyst of the 18th of July and will end on August 7th.    She denies any r fevers or chills. She denies focal neurological symptoms, headaches or visual changes, abdominal or urinary. Her appetite and weight is stable.    She reported pain in back,neck and shoulders; numbness in fingers and feet, and dry skin. The patient denied fever, chills, night sweats, change in appetite or weight. She denied odynophagia or dysphagia. No chest pain, palpitations, abdominal pain, vomiting, constipation, hematochezia. The patient denied dysuria, nocturia, polyuria, hematuria, myalgia, tingling, psychiatric problems.   MEDICAL HISTORY: Past Medical History   Diagnosis Date  . Hypertension   . Diabetes mellitus   . Anemia   . Osteoporosis   . Dyslipidemia   . Myocardial infarction   . Coronary artery disease   . Shortness of breath   . GERD (gastroesophageal reflux disease)   . Wears glasses   . colon ca dx'd 2003    surg only  . Multiple myeloma dx'd 2009    oral chemo ongoing    INTERIM HISTORY: has Multiple myeloma; Anemia of chronic disease; Magnesium deficiency; Type II or unspecified type diabetes mellitus without mention of complication, uncontrolled; Hypertension; Coronary artery disease with history of MI; Fall; Fracture of multiple ribs; PNA (pneumonia); Thrombocytopenia, unspecified; Hyponatremia; Moderate protein-calorie malnutrition; Hypomagnesemia; Hyperlipidemia; Hypokalemia; GI bleed; Acute GI bleeding; Chronic combined systolic and diastolic congestive heart failure; Abdominal pain, epigastric ventral hernia; Hiatal hernia; History of colon cancer; Greater trochanter fracture; Hip fracture; and UTI (urinary tract infection) on her problem list.    ALLERGIES:  is allergic to gemfibrozil; nifedipine; and questran.  MEDICATIONS: has a current medication list which includes the following prescription(s): acyclovir, magic mouthwash w/lidocaine, bisacodyl, calcium carbonate, camphor-menthol-capsicum, carvedilol, cholecalciferol, cyanocobalamin, cyclobenzaprine, dexamethasone, hydrocodone-acetaminophen, lisinopril, loperamide, magnesium hydroxide, magnesium oxide, metformin, multivitamin with minerals, nystatin, pantoprazole, polyethyl glycol-propyl glycol, pomalidomide, prochlorperazine, and sodium chloride, and the following Facility-Administered Medications: darbepoetin.  SURGICAL HISTORY:  Past Surgical History  Procedure Laterality Date  . Coronary artery bypass graft  08/2001    4 vessel  . Colon surgery  2003  . Tonsillectomy    . Appendectomy    . Abdominal hysterectomy    .  Back surgery    . Eye surgery      cataracts   . Direct laryngoscopy with radiaesse injection Left 08/13/2012    Procedure: LEFT VOCAL CORD RADIESSE AUGMENTATION LARYNGOSCOPY ;  Surgeon: Jodi Marble, MD;  Location: [facility];  Service: ENT;  Laterality: Left;  . Colonoscopy N/A 06/13/2013    Procedure: COLONOSCOPY;  Surgeon: Jeryl Columbia, MD;  Location: [facility];  Service: Endoscopy;  Laterality: N/A;   Problem List:  1. Multiple myeloma, initially presenting as an IgA kappa monoclonal gammopathy in February 2009. 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 This was felt to be an adverse prognostic determinant. Bone marrow on 08/31/2007 showed 46% plasma cells. A repeat bone marrow on 08/05/2009 showed 73% plasma cells. Treatment with Revlimid was started in April 2011. Zometa was started in October 2011 and 2 years of treatment were concluded on 02/23/2012. We had previously started Aranesp for the patient's anemia in May of 2010. Most recent metastatic bone survey was on 08/05/2009. Other x-rays since then are available. Maximum IgA level was 2080 on 08/11/2009. Subcutaneous Velcade was added to the patient's treatment program on 06/14/2011 because of a rising IgA level 1080 on 06/02/2011. The patient had been receiving Velcade every 2 weeks in combination with Revlimid during the early months of 2014. The patient continues to demonstrate progressive disease as evidenced by rising IgA and serum kappa levels. Revlimid was discontinued in April 2014. The patient had received Revlimid for about 3 years from April 2011 through April 2014. As of 08/29/2012, the patient was receiving Velcade, Cytoxan and Decadron every 2 weeks. The patient has also been receiving Aranesp 300 mcg subcu every 2 weeks for hemoglobin less than or equal to 10.  Her VCD was discontinued due to progression and she started Pomalyst plus dexamethasone on 10/25/2013.  2. Anemia secondary to vitamin B12 deficiency. The patient had been on oral vitamin B12.  However, her vitamin B12 level fell to 284 on 07/26/2011 and vitamin B12 shots 1000 mcg given IM every month was resumed on 09/06/2011.  3. Anemia secondary to iron deficiency.   REVIEW OF SYSTEMS:   Constitutional: Denies fevers, chills or abnormal weight loss Eyes: Denies blurriness of vision Ears, nose, mouth, throat, and face: Denies mucositis or sore throat Respiratory: Denies cough, dyspnea or wheezes Cardiovascular: Denies palpitation, chest discomfort or lower extremity swelling Gastrointestinal:  Denies nausea, heartburn;  positive for constipation Skin: Denies abnormal skin rashes Lymphatics: Denies new lymphadenopathy or easy bruising Neurological:Denies numbness, tingling or new weaknesses Behavioral/Psych: Mood is stable, no new changes  All other systems were reviewed with the patient and are negative.  PHYSICAL EXAMINATION: ECOG PERFORMANCE STATUS: 1 - Symptomatic but completely ambulatory  Blood pressure 123/45, pulse 76, temperature 98.2 F (36.8 C), temperature source Oral, resp. rate 18, height 4' 8"  (1.422 m), weight 94 lb 3.2 oz (42.729 kg).  GENERAL:alert, no distress and comfortable; Diminished hearing; Kyphotic SKIN: skin color, texture, turgor are normal, no rashes or significant lesions  EYES: normal, Conjunctiva are pink and non-injected, sclera clear OROPHARYNX:no exudate, no erythema and lips, buccal mucosa, and tongue normal  NECK: supple, thyroid normal size, non-tender, without nodularity LYMPH:  no palpable lymphadenopathy in the cervical, axillary or supraclavicular LUNGS: clear to auscultation and percussion with normal breathing effort HEART: regular rate & rhythm and no murmurs and no lower extremity edema ABDOMEN:abdomen soft, non-tender and normal bowel sounds Musculoskeletal:no cyanosis of digits and no clubbing  NEURO: alert & oriented  x 3 with fluent speech, no focal motor/sensory deficits; cautious gait with a walker.   Lab Results   Component Value Date   WBC 2.8* 12/11/2013   HGB 9.9* 12/11/2013   HCT 31.4* 12/11/2013   MCV 95.5 12/11/2013   PLT 204 12/11/2013   NEUTROABS 1.5 12/11/2013      Chemistry      Component Value Date/Time   NA 135* 12/11/2013 0854   NA 132* 11/11/2013 0524   K 3.3* 12/11/2013 0854   K 3.7 11/11/2013 0524   CL 93* 11/11/2013 0524   CL 101 10/18/2012 1051   CO2 30* 12/11/2013 0854   CO2 27 11/11/2013 0524   BUN 8.5 12/11/2013 0854   BUN 14 11/11/2013 0524   CREATININE 0.6 12/11/2013 0854   CREATININE 0.56 11/11/2013 0524   CREATININE 0.69 08/11/2009 1145      Component Value Date/Time   CALCIUM 8.8 12/11/2013 0854   CALCIUM 8.8 11/11/2013 0524   ALKPHOS 64 12/11/2013 0854   ALKPHOS 40 11/11/2013 0524   AST 11 12/11/2013 0854   AST 11 11/11/2013 0524   ALT <6 12/11/2013 0854   ALT 7 11/11/2013 0524   BILITOT 0.50 12/11/2013 0854   BILITOT 0.3 11/11/2013 0524     RADIOGRAPHIC STUDIES: (personally reviewed by me) 10/30/2013  METASTATIC BONE SURVEY  COMPARISON: Metastatic survey dated 11/01/2012  FINDINGS: The bones are osteopenic. Focal area of lucency within the frontal region of the cranium. Finding has developed in the interim when compared to prior. Multilevel spondylosis throughout the cervical, thoracic, and lumbar  spine, stable. Stable areas of cement augmentation within the lower thoracic spine and lower lumbar spine. No appreciable destructive lytic lesions within the spine. There has been increase in the number of rib fractures on the left. These fractures have areas of callus, age indeterminate. No appreciable destructive lytic lesions. No appreciable rib fractures on the left. Osteoarthritic changes are again appreciated within the hips, knees, right ankle. There is no evidence of aggressive appearing lytic lesions within the appendicular skeleton. Diffuse advanced atherosclerotic calcifications appreciated. IMPRESSION:  Interval development of a lytic focus in the frontal region of the  calvarium. No further regions destructive appearing lytic lesions. Stable diffuse spondylosis throughout the spine. Stable areas of cement augmentation within the thoracic and lumbar spine Increase in the rib fractures on the left age indeterminate. Advanced atherosclerotic changes Multiple areas of advanced osteoarthritic changes.  11/09/2013 RIGHT HIP - COMPLETE 2+ VIEW  COMPARISON: CT from the previous day  FINDINGS: Minimally distracted avulsion fracture of the greater trochanter. No definite extension across the femoral neck or head. No dislocation. No other fracture or acute bone abnormality. Changes of kyphoplasty/ vertebroplasty in the lower lumbar spine. Extensive bilateral iliofemoral arterial calcifications. Mild diffuse osteopenia.  IMPRESSION: 1. Little definite change in appearance of right greater trochanter avulsion fracture.  ASSESSMENT: [place] 80 y.o. female with a history of Multiple myeloma - Plan: CBC with Differential, Comprehensive metabolic panel (Cmet) - [facility], Lactate dehydrogenase (LDH) - [facility], Kappa/lambda light chains, IgG, IgA, IgM   PLAN:  1. Multiple myeloma, progressive. --Her M-spike had increased and her IgA level was 2270 up 1060 on 08/14/2013.   Her IgA level is now 1,370 with Kappa:Lamda ratio of 63.44 on 11/20/2013.   We will continue pomalyst treatment given she received at least 2 prior therapies (including lenalidomide and bortezomib) and had demonstrated disease progression on or within 60 days of completion of the last therapy. We stopped Velcade,  cytoxan and changed to pomalyst 2 mg daily for 21 days, then off for 7 days in combination with her dexamethasone 20 mg weekly. She will continue on aspirin to reduce her risk of clots. The benefit of treatment including controlling her multiple myeloma and risks were discussed including but not limited to bone marrow suppression resulting in life threatening infections, anemia and fatigue, diarrhea, second  malignancies and neuropathy.  She agreed to proceed with treatment.  If her MM progresses despite this treatment, we will consider a carfilzomib-based treatment.   --We will follow SPEP plus IFE, UPEP plus IFE and Kappa Lambda light chains while on therapy monthly.   We obtained a skeletal survey on 10/30/2013 which demonstrated an interval development of a lytic focus in the frontal region of the calvarium.    2. S/p R greater trochanteric fracture. --She reports that her pain is stable.  Continued physical therapy as tolerated.   3. Hypokalemia, mild. --Patient instructed on increase in po dietary potassium.    4. Hypoalbuminemia, improving. --Her albumin is 2.6 up from 2.5 on 07/08 up from 2.1 on 6/29.    5. Leukopenia, anemia. --Secondary to number 1 plus or minus pomalyst.  Her ANC is 1.5.  We will continue treatment with repeat CBC in 2 weeks. She is asymptomatic.   6. Follow up. RTC in 4 weeks for evaluation for another treatment with CBC, CMET.  CBC in 2 weeks.   All questions were answered. The patient knows to call the clinic with any problems, questions or concerns. We can certainly see the patient much sooner if necessary.  I spent 15 minutes counseling the patient face to face. The total time spent in the appointment was 25 minutes.    Raydon Chappuis, MD 12/11/2013 1:28 PM

## 2013-12-11 NOTE — Telephone Encounter (Signed)
Pt confirmed labs/ov per 07/29 POF, gave pt AVS...KJ °

## 2013-12-18 ENCOUNTER — Telehealth: Payer: Self-pay

## 2013-12-18 NOTE — Telephone Encounter (Signed)
error 

## 2013-12-18 NOTE — Telephone Encounter (Signed)
Let pt know potassium was low - per AJ instructions pt should increase dietary potassium.  Went over foods high in potassium with pt.  Pt voiced understanding.

## 2013-12-24 ENCOUNTER — Other Ambulatory Visit: Payer: Self-pay | Admitting: *Deleted

## 2013-12-24 NOTE — Telephone Encounter (Signed)
THIS REFILL REQUEST FOR POMALYST WAS GIVEN TO DR.CHISM'S NURSE, KATHY BUYCK,RN.

## 2013-12-25 ENCOUNTER — Other Ambulatory Visit: Payer: Self-pay | Admitting: *Deleted

## 2013-12-25 DIAGNOSIS — C9000 Multiple myeloma not having achieved remission: Secondary | ICD-10-CM

## 2013-12-25 MED ORDER — POMALIDOMIDE 2 MG PO CAPS: 2.0000 mg | ORAL_CAPSULE | Freq: Every day | ORAL | Status: DC

## 2014-01-01 ENCOUNTER — Encounter: Payer: Self-pay | Admitting: Internal Medicine

## 2014-01-01 ENCOUNTER — Non-Acute Institutional Stay (SKILLED_NURSING_FACILITY): Payer: Medicare Other | Admitting: Internal Medicine

## 2014-01-01 DIAGNOSIS — IMO0001 Reserved for inherently not codable concepts without codable children: Secondary | ICD-10-CM

## 2014-01-01 DIAGNOSIS — I10 Essential (primary) hypertension: Secondary | ICD-10-CM

## 2014-01-01 DIAGNOSIS — E871 Hypo-osmolality and hyponatremia: Secondary | ICD-10-CM

## 2014-01-01 DIAGNOSIS — S72001D Fracture of unspecified part of neck of right femur, subsequent encounter for closed fracture with routine healing: Secondary | ICD-10-CM

## 2014-01-01 DIAGNOSIS — I509 Heart failure, unspecified: Secondary | ICD-10-CM

## 2014-01-01 DIAGNOSIS — C9000 Multiple myeloma not having achieved remission: Secondary | ICD-10-CM

## 2014-01-01 DIAGNOSIS — E1165 Type 2 diabetes mellitus with hyperglycemia: Secondary | ICD-10-CM

## 2014-01-01 DIAGNOSIS — S72009D Fracture of unspecified part of neck of unspecified femur, subsequent encounter for closed fracture with routine healing: Secondary | ICD-10-CM

## 2014-01-01 DIAGNOSIS — D638 Anemia in other chronic diseases classified elsewhere: Secondary | ICD-10-CM

## 2014-01-01 DIAGNOSIS — I5042 Chronic combined systolic (congestive) and diastolic (congestive) heart failure: Secondary | ICD-10-CM

## 2014-01-01 NOTE — Progress Notes (Signed)
Patient ID: Christina Hopkins, female   DOB: 1926-01-08, 80 y.o.   MRN: [id number]   this is a routine visit.  Care skilled.  Facility [facility].   Chief Complaint   Medical management of hip fracture-multiple myeloma-diabetes type 2-hypertension-hyponatremia-CHF   .    HPI: Patient is 80 y.o. female who is admitted for OT/PT after fall with hip fracture--- this was treated conservatively-and she appears to be doing well with this ambulating with a walker.  This is complicated with a history of multiple myeloma which is followed closely by oncology and apparently has been relatively stable.  Her only complaint today is some lip discomfort of her upper lip apparently she had received magic mouthwash in the past with good relief and would like this restarted.  She does have a listed history of hypertension I see list of blood pressures with systolics in the 92Z at times-however I took it manually today and got 146 over 78.  I suspect this is machine variation.  She does not really report significant dizziness syncopal-type feelings-.  .  Past Medical History   Diagnosis  Date   .  Hypertension    .  Diabetes mellitus    .  Anemia    .  Osteoporosis    .  Dyslipidemia    .  Myocardial infarction    .  Coronary artery disease    .  Shortness of breath    .  GERD (gastroesophageal reflux disease)    .  Wears glasses    .  colon ca  dx'd 2003     surg only   .  Multiple myeloma  dx'd 2009     oral chemo ongoing    Past Surgical History   Procedure  Laterality  Date   .  Coronary artery bypass graft   08/2001     4 vessel   .  Colon surgery   2003   .  Tonsillectomy     .  Appendectomy     .  Abdominal hysterectomy     .  Back surgery     .  Eye surgery       cataracts   .  Direct laryngoscopy with radiaesse injection  Left  08/13/2012     Procedure: LEFT VOCAL CORD RADIESSE AUGMENTATION LARYNGOSCOPY ; Surgeon: Jodi Marble, MD; Location: [facility]; Service: ENT;  Laterality: Left;   .  Colonoscopy  N/A  06/13/2013     Procedure: COLONOSCOPY; Surgeon: Jeryl Columbia, MD; Location: [facility]; Service: Endoscopy; Laterality: N/A;      Medication List         .            acyclovir 400 MG tablet    Commonly known as: ZOVIRAX    Take 400 mg by mouth 2 (two) times daily.    calcium carbonate 500 MG chewable tablet    Commonly known as: TUMS - dosed in mg elemental calcium    Chew 1 tablet by mouth daily.    carvedilol 25 MG tablet    Commonly known as: COREG    Take 25 mg by mouth 2 (two) times daily with a meal.              cholecalciferol 1000 UNITS tablet    Commonly known as: VITAMIN D    Take 5,000 Units by mouth daily.    cyanocobalamin 1000 MCG/ML injection    Commonly known as: (VITAMIN  B-12)    Inject 1,000 mcg into the muscle every 30 (thirty) days.    cyclobenzaprine 10 MG tablet    Commonly known as: FLEXERIL    Take 10 mg by mouth 3 (three) times daily as needed for muscle spasms.    dexamethasone 4 MG tablet    Commonly known as: DECADRON    Take 20 mg by mouth once a week. On Wednesdays    HYDROcodone-acetaminophen 5-325 MG per tablet    Commonly known as: NORCO/VICODIN    Take 1-2 tablets by mouth every 4 (four) hours as needed for moderate pain.    lisinopril 2.5 MG tablet    Commonly known as: PRINIVIL,ZESTRIL    Take 2.5 mg by mouth every other day.    loperamide 2 MG capsule    Commonly known as: IMODIUM    Take 2 mg by mouth 3 (three) times daily as needed for diarrhea or loose stools.    magic mouthwash w/lidocaine Soln    Take 5 mLs by mouth 3 (three) times daily as needed for mouth pain.    magnesium oxide 400 MG tablet    Commonly known as: MAG-OX    Take 1 tablet (400 mg total) by mouth 2 (two) times daily.    metFORMIN 500 MG tablet    Commonly known as: GLUCOPHAGE    Take 500 mg by mouth 2 (two) times daily with a meal.    multivitamin with minerals Tabs tablet    Take 1 tablet by mouth daily.     pantoprazole 40 MG tablet    Commonly known as: PROTONIX    Take 40 mg by mouth daily.    pomalidomide 2 MG capsule    Commonly known as: POMALYST    Take 1 capsule (2 mg total) by mouth daily. Take with water on days 1-21. Repeat every 28 days.    prochlorperazine 10 MG tablet    Commonly known as: COMPAZINE    Take 10 mg by mouth every 6 (six) hours as needed for nausea or vomiting.    SYSTANE 0.4-0.3 % Soln    Generic drug: Polyethyl Glycol-Propyl Glycol    Place 1 drop into both eyes 3 (three) times daily as needed (dry eyes).    TIGER BALM PAIN RELIEVING 80-24-16 MG Ptch    Generic drug: Camphor-Menthol-Capsicum    Apply 1 patch topically daily as needed (pain).     No orders of the defined types were placed in this encounter.  Immunization History   Administered  Date(s) Administered   .  Influenza-Unspecified  02/09/2011, 01/27/2012   .  Pneumococcal Conjugate-13  02/22/2005    History   Substance Use Topics   .  Smoking status:  Never Smoker   .  Smokeless tobacco:  Never Used   .  Alcohol Use:  No   Family history is noncontributory  Review of Systems  DATA OBTAINED: from patient,  GENERAL: Feels well no fevers, fatigue, appetite changes  SKIN: No itching, rash or wounds  EYES: No eye pain, redness, discharge  EARS: No earache, tinnitus, change in hearing  NOSE: No congestion, drainage or bleeding  MOUTH/THROAT: Nor tooth pain, No sore throat--does complain of a sore upper mouth--interior lip pain  RESPIRATORY: No cough, wheezing, SOB  CARDIAC: No chest pain, palpitations, lower extremity edema  GI: No abdominal pain, No N/V/D or constipation, No heartburn or reflux  GU: No dysuria, frequency or urgency, or incontinence  MUSCULOSKELETAL: only c/o is pain  NEUROLOGIC: No headache, dizziness or focal weakness  PSYCHIATRIC: No overt anxiety or sadness. Sleeps well. No behavior issue.  Filed Vitals:                   Physical Exam  She is afebrile pulse 64  respirations 19 blood pressure 146/78 manual GENERAL APPEARANCE: Alert, conversant. Appropriately groomed. No acute distress.  SKIN: No diaphoresis rash  HEAD: Normocephalic, atraumatic  EYES: Conjunctiva/lids clear. Pupils round, reactive. EOMs intact.  EARS: . Hearing grossly normal.  NOSE: No deformity or discharge.  MOUTH/THROAT: Do note what appears to be possible ulcer mid upper internal lip area. Otherwise oropharynx is clear mucous membranes moist tongue is midline--  RESPIRATORY: Breathing is even, unlabored. Lung sounds are clear  CARDIOVASCULAR: Heart RRR no murmurs, rubs or gallops. No peripheral edema.  GASTROINTESTINAL: Abdomen is soft, non-tender, protuberant w/ normal bowel sound MUSCULOSKELETAL: No abnormal joints or musculatur--she is kyphotic5 is able to stand without assistance and use her walker e  NEUROLOGIC: Cranial nerves 2-12 grossly intact. Moves all extremities no tremor.  PSYCHIATRIC: Mood and affect appropriate to situation, no behavioral issues  Patient Active Problem List    Diagnosis  Date Noted   .  UTI (urinary tract infection)  11/15/2013   .  Greater trochanter fracture  11/09/2013   .  Hip fracture  11/09/2013   .  Abdominal pain, epigastric ventral hernia  06/12/2013   .  Hiatal hernia  06/12/2013   .  History of colon cancer  06/12/2013   .  Chronic combined systolic and diastolic congestive heart failure  06/07/2013   .  Hypokalemia  06/06/2013   .  GI bleed  06/06/2013   .  Acute GI bleeding  06/06/2013   .  Hyperlipidemia  03/30/2013   .  Hypomagnesemia  03/26/2013   .  Thrombocytopenia, unspecified  03/24/2013   .  Hyponatremia  03/24/2013   .  Moderate protein-calorie malnutrition  03/24/2013   .  Fall  03/23/2013   .  Fracture of multiple ribs  03/23/2013   .  PNA (pneumonia)  03/23/2013   .  Magnesium deficiency  04/05/2011   .  Type II or unspecified type diabetes mellitus without mention of complication, uncontrolled  04/05/2011   .   Hypertension  04/05/2011   .  Coronary artery disease with history of MI  04/05/2011   .  Multiple myeloma  04/02/2011   .  Anemia of chronic disease  04/02/2011     Labs.  12/25/2013.  WBC 4.3.  Hemoglobin 9.9.  Platelets 174.  12/13/2013.  Sodium 138 potassium 3.6 BUN 11 creatinine 0.5.  11/14/2013.  Liver function tests within normal limits except albumin of 2.7     Component  Value  Date/Time    WBC  7.6  11/09/2013 1516    WBC  4.5  11/06/2013 0837    RBC  2.86*  11/09/2013 1516    RBC  2.85*  11/06/2013 0837    HGB  9.2*  11/09/2013 1516    HGB  9.1*  11/06/2013 0837    HCT  27.7*  11/09/2013 1516    HCT  28.7*  11/06/2013 0837    PLT  181  11/09/2013 1516    PLT  151  11/06/2013 0837    MCV  96.9  11/09/2013 1516    MCV  100.7  11/06/2013 0837    LYMPHSABS  0.7*  11/06/2013 0837    LYMPHSABS  0.9  06/06/2013 0957    MONOABS  0.5  11/06/2013 0837    MONOABS  0.4  06/06/2013 0957    EOSABS  0.1  11/06/2013 0837    EOSABS  0.0  06/06/2013 0957    BASOSABS  0.0  11/06/2013 0837    BASOSABS  0.0  06/06/2013 0957   CMP    Component  Value  Date/Time    NA  132*  11/11/2013 0524    NA  138  10/23/2013 0915    K  3.7  11/11/2013 0524    K  4.0  10/23/2013 0915    CL  93*  11/11/2013 0524    CL  101  10/18/2012 1051    CO2  27  11/11/2013 0524    CO2  29  10/23/2013 0915    GLUCOSE  133*  11/11/2013 0524    GLUCOSE  163*  10/23/2013 0915    GLUCOSE  153*  10/18/2012 1051    BUN  14  11/11/2013 0524    BUN  8.2  10/23/2013 0915    CREATININE  0.56  11/11/2013 0524    CREATININE  0.8  10/23/2013 0915    CREATININE  0.69  08/11/2009 1145    CALCIUM  8.8  11/11/2013 0524    CALCIUM  9.9  10/23/2013 0915    PROT  6.6  11/11/2013 0524    PROT  8.1  10/23/2013 0915    ALBUMIN  2.1*  11/11/2013 0524    ALBUMIN  3.0*  10/23/2013 0915    AST  11  11/11/2013 0524    AST  15  10/23/2013 0915    ALT  7  11/11/2013 0524    ALT  8  10/23/2013 0915    ALKPHOS  40  11/11/2013 0524    ALKPHOS  48  10/23/2013  0915    BILITOT  0.3  11/11/2013 0524    BILITOT  0.35  10/23/2013 0915    GFRNONAA  82*  11/11/2013 0524    GFRAA  >90  11/11/2013 0524   Assessment and Plan  Greater trochanter fracture  pT/OT eval recommended SNF. Weight bearing as tolerated--she is doing well with this able to ambulate with a walker --apparently pain medication--Vicodin  is fairly effective  she does ask for this frequently per nursing UTI (urinary tract infection)   At this point asymptomatic has completed antibiotic  Multiple myeloma  This is followed by oncology most recently seen  last month she will be seen again this month per patient  Hyponatremia  Appeared to be clearing in hospital with IVF but Na+ on 7/1 was 128 with BUN 15, Cr 0.5 so don't think its a hydration issue; --has been started on sodium chloride in this appears to have stabilized-will update basic metabolic panel.  Anemia of chronic disease  Stable;continue vitamin B 12  Chronic combined systolic and diastolic congestive heart failure  Pt on coreg and Lisinopril, --this appears to be stable-no evidence of edema here does not complaining of any shortness of breath or chest pain Type II or unspecified type diabetes mellitus without mention of complication, uncontrolled  A1c is 6.5 on metformin and ACE --CBGs appear satisfactory --running largely in the lower 100s in the morning-occasionally a bit above 200 later in the day although this is not the normt t at this point continue to monitor.  Hypertension?-Appears to have variable blood pressures will order blood pressures here with pulse every shift with a log  for review next week she does not appear to be symptomatic of any hypotension blood pressure was actually mildly elevated today would like to get more readings so we get a more accurate read on this--prefer done manually for accuracy  Mouth discomfort-appears to have a small ulcer upper lip-she has received relief with Magic mouthwash in the past will  restart this when necessary monitor for any changes-I discussed this with her to notify staff if this does not improve    [id number]-of note greater than 40 minutes spent assessing patient-reviewing her chart and medical records-and formulating  and coordinating a plan of care for numerous diagnoses-of note greater than 50% of time spent coordinating plan of care

## 2014-01-03 ENCOUNTER — Other Ambulatory Visit: Payer: Self-pay | Admitting: *Deleted

## 2014-01-03 MED ORDER — HYDROCODONE-ACETAMINOPHEN 5-325 MG PO TABS: ORAL_TABLET | ORAL | Status: DC

## 2014-01-03 NOTE — Telephone Encounter (Signed)
[facility] 

## 2014-01-08 ENCOUNTER — Other Ambulatory Visit (HOSPITAL_BASED_OUTPATIENT_CLINIC_OR_DEPARTMENT_OTHER): Payer: Medicare Other

## 2014-01-08 ENCOUNTER — Telehealth: Payer: Self-pay | Admitting: Internal Medicine

## 2014-01-08 ENCOUNTER — Ambulatory Visit (HOSPITAL_BASED_OUTPATIENT_CLINIC_OR_DEPARTMENT_OTHER): Payer: Medicare Other | Admitting: Internal Medicine

## 2014-01-08 VITALS — BP 139/56 | HR 66 | Resp 20 | Ht <= 58 in | Wt 93.3 lb

## 2014-01-08 DIAGNOSIS — D630 Anemia in neoplastic disease: Secondary | ICD-10-CM

## 2014-01-08 DIAGNOSIS — C9000 Multiple myeloma not having achieved remission: Secondary | ICD-10-CM

## 2014-01-08 LAB — COMPREHENSIVE METABOLIC PANEL (CC13)
ALK PHOS: 61 U/L (ref 40–150)
ALT: 10 U/L (ref 0–55)
ANION GAP: 8 meq/L (ref 3–11)
AST: 12 U/L (ref 5–34)
Albumin: 3.1 g/dL — ABNORMAL LOW (ref 3.5–5.0)
BILIRUBIN TOTAL: 0.49 mg/dL (ref 0.20–1.20)
BUN: 11.9 mg/dL (ref 7.0–26.0)
CO2: 30 meq/L — AB (ref 22–29)
CREATININE: 0.6 mg/dL (ref 0.6–1.1)
Calcium: 9.8 mg/dL (ref 8.4–10.4)
Chloride: 102 mEq/L (ref 98–109)
Glucose: 142 mg/dl — ABNORMAL HIGH (ref 70–140)
Potassium: 4.2 mEq/L (ref 3.5–5.1)
Sodium: 140 mEq/L (ref 136–145)
Total Protein: 6.7 g/dL (ref 6.4–8.3)

## 2014-01-08 LAB — CBC WITH DIFFERENTIAL/PLATELET
BASO%: 1.5 % (ref 0.0–2.0)
Basophils Absolute: 0.1 10*3/uL (ref 0.0–0.1)
EOS%: 0.9 % (ref 0.0–7.0)
Eosinophils Absolute: 0 10*3/uL (ref 0.0–0.5)
HEMATOCRIT: 34 % — AB (ref 34.8–46.6)
HGB: 10.5 g/dL — ABNORMAL LOW (ref 11.6–15.9)
LYMPH%: 19.9 % (ref 14.0–49.7)
MCH: 29.6 pg (ref 25.1–34.0)
MCHC: 30.9 g/dL — AB (ref 31.5–36.0)
MCV: 96 fL (ref 79.5–101.0)
MONO#: 0.2 10*3/uL (ref 0.1–0.9)
MONO%: 4.1 % (ref 0.0–14.0)
NEUT#: 3.9 10*3/uL (ref 1.5–6.5)
NEUT%: 73.6 % (ref 38.4–76.8)
PLATELETS: 255 10*3/uL (ref 145–400)
RBC: 3.54 10*6/uL — ABNORMAL LOW (ref 3.70–5.45)
RDW: 20.2 % — ABNORMAL HIGH (ref 11.2–14.5)
WBC: 5.3 10*3/uL (ref 3.9–10.3)
lymph#: 1.1 10*3/uL (ref 0.9–3.3)

## 2014-01-08 LAB — LACTATE DEHYDROGENASE (CC13): LDH: 164 U/L (ref 125–245)

## 2014-01-08 NOTE — Telephone Encounter (Signed)
, °

## 2014-01-08 NOTE — Progress Notes (Signed)
[facility] OFFICE PROGRESS NOTE  [name], MD [address] [place] [address]  DIAGNOSIS: Multiple myeloma - Plan: CBC with Differential, Comprehensive metabolic panel (Cmet) - [facility], Lactate dehydrogenase (LDH) - [facility], IgG, IgA, IgM, Kappa/lambda light chains  Chief Complaint  Patient presents with  . Multiple Myeloma   DIAGNOSIS:  1. Multiple myeloma.  2. Anemia secondary to vitamin B12 deficiency.   PAST THERAPY:  Revlimid (08/2009-08/2012)  Zometa (02/2010 - 02/2012)  Velcade (05/2011 -10/09/2013)  Cytoxan 08/2012 - 10/09/2013)  Decadron (08/2012 - present)   CURRENT THERAPY:  1. Pomalyst 2 mg daily for 1 - 21 days and then off 7 days started on 10/25/2013.  2. Decadron 20 mg weekly (every Wednesday) 2. Vitamin B12 mg IM monthly.  3. Aranesp 300 mcg subcu every 2 weeks for hemoglobin less than or equal to 10.   Interim History: The patient is a pleasant 80 y.o. female who presented for follow up visit. She was last seen by me on 12/11/2013. Today, she is accompanied by her daughter [name].  She continues rehab for a right greater trochanter frx (on observation, not felt to require surgical intervention).  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  She restarted cycle #3 of her pomalyst and is mid-cycle.    She denies any r fevers or chills. She denies focal neurological symptoms, headaches or visual changes, abdominal or urinary. Her appetite and weight is stable.    She reported pain in back,neck and shoulders; numbness in fingers and feet, and dry skin. The patient denied fever, chills, night sweats, change in appetite or weight. She denied odynophagia or dysphagia. No chest pain, palpitations, abdominal pain, vomiting, constipation, hematochezia. The patient denied dysuria, nocturia, polyuria, hematuria, myalgia, tingling, psychiatric problems.   MEDICAL HISTORY: Past Medical History  Diagnosis Date  . Hypertension   . Diabetes  mellitus   . Anemia   . Osteoporosis   . Dyslipidemia   . Myocardial infarction   . Coronary artery disease   . Shortness of breath   . GERD (gastroesophageal reflux disease)   . Wears glasses   . colon ca dx'd 2003    surg only  . Multiple myeloma dx'd 2009    oral chemo ongoing    INTERIM HISTORY: has Multiple myeloma; Anemia of chronic disease; Magnesium deficiency; Type II or unspecified type diabetes mellitus without mention of complication, uncontrolled; Hypertension; Coronary artery disease with history of MI; Fall; Fracture of multiple ribs; PNA (pneumonia); Thrombocytopenia, unspecified; Hyponatremia; Moderate protein-calorie malnutrition; Hypomagnesemia; Hyperlipidemia; Hypokalemia; GI bleed; Acute GI bleeding; Chronic combined systolic and diastolic congestive heart failure; Abdominal pain, epigastric ventral hernia; Hiatal hernia; History of colon cancer; Greater trochanter fracture; Hip fracture; and UTI (urinary tract infection) on her problem list.    ALLERGIES:  is allergic to gemfibrozil; nifedipine; and questran.  MEDICATIONS: has a current medication list which includes the following prescription(s): acyclovir, magic mouthwash w/lidocaine, bisacodyl, calcium carbonate, camphor-menthol-capsicum, carvedilol, cholecalciferol, cyanocobalamin, cyclobenzaprine, dexamethasone, hydrocodone-acetaminophen, lisinopril, loperamide, magnesium hydroxide, magnesium oxide, metformin, multivitamin with minerals, nystatin, pantoprazole, polyethyl glycol-propyl glycol, pomalidomide, prochlorperazine, and sodium chloride, and the following Facility-Administered Medications: darbepoetin.  SURGICAL HISTORY:  Past Surgical History  Procedure Laterality Date  . Coronary artery bypass graft  08/2001    4 vessel  . Colon surgery  2003  . Tonsillectomy    . Appendectomy    . Abdominal hysterectomy    . Back surgery    . Eye  surgery      cataracts  . Direct laryngoscopy with radiaesse  injection Left 08/13/2012    Procedure: LEFT VOCAL CORD RADIESSE AUGMENTATION LARYNGOSCOPY ;  Surgeon: Jodi Marble, MD;  Location: [facility];  Service: ENT;  Laterality: Left;  . Colonoscopy N/A 06/13/2013    Procedure: COLONOSCOPY;  Surgeon: Jeryl Columbia, MD;  Location: [facility];  Service: Endoscopy;  Laterality: N/A;   Problem List:  1. Multiple myeloma, initially presenting as an IgA kappa monoclonal gammopathy in February 2009. 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 This was felt to be an adverse prognostic determinant. Bone marrow on 08/31/2007 showed 46% plasma cells. A repeat bone marrow on 08/05/2009 showed 73% plasma cells. Treatment with Revlimid was started in April 2011. Zometa was started in October 2011 and 2 years of treatment were concluded on 02/23/2012. We had previously started Aranesp for the patient's anemia in May of 2010. Most recent metastatic bone survey was on 08/05/2009. Other x-rays since then are available. Maximum IgA level was 2080 on 08/11/2009. Subcutaneous Velcade was added to the patient's treatment program on 06/14/2011 because of a rising IgA level 1080 on 06/02/2011. The patient had been receiving Velcade every 2 weeks in combination with Revlimid during the early months of 2014. The patient continues to demonstrate progressive disease as evidenced by rising IgA and serum kappa levels. Revlimid was discontinued in April 2014. The patient had received Revlimid for about 3 years from April 2011 through April 2014. As of 08/29/2012, the patient was receiving Velcade, Cytoxan and Decadron every 2 weeks. The patient has also been receiving Aranesp 300 mcg subcu every 2 weeks for hemoglobin less than or equal to 10.  Her VCD was discontinued due to progression and she started Pomalyst plus dexamethasone on 10/25/2013.  2. Anemia secondary to vitamin B12 deficiency. The patient had been on oral vitamin B12. However, her vitamin B12 level fell to 284  on 07/26/2011 and vitamin B12 shots 1000 mcg given IM every month was resumed on 09/06/2011.  3. Anemia secondary to iron deficiency.   REVIEW OF SYSTEMS:   Constitutional: Denies fevers, chills or abnormal weight loss Eyes: Denies blurriness of vision Ears, nose, mouth, throat, and face: Denies mucositis or sore throat Respiratory: Denies cough, dyspnea or wheezes Cardiovascular: Denies palpitation, chest discomfort or lower extremity swelling Gastrointestinal:  Denies nausea, heartburn;  positive for constipation Skin: Denies abnormal skin rashes Lymphatics: Denies new lymphadenopathy or easy bruising Neurological:Denies numbness, tingling or new weaknesses Behavioral/Psych: Mood is stable, no new changes  All other systems were reviewed with the patient and are negative.  PHYSICAL EXAMINATION: ECOG PERFORMANCE STATUS: 1 - Symptomatic but completely ambulatory  Blood pressure 139/56, pulse 66, resp. rate 20, height _0  (1.422 m), weight 93 lb 4.8 oz (42.321 kg).  GENERAL:alert, no distress and comfortable; Diminished hearing; Kyphotic SKIN: skin color, texture, turgor are normal, no rashes or significant lesions  EYES: normal, Conjunctiva are pink and non-injected, sclera clear OROPHARYNX:no exudate, no erythema and lips, buccal mucosa, and tongue normal  NECK: supple, thyroid normal size, non-tender, without nodularity LYMPH:  no palpable lymphadenopathy in the cervical, axillary or supraclavicular LUNGS: clear to auscultation and percussion with normal breathing effort HEART: regular rate & rhythm and no murmurs and no lower extremity edema ABDOMEN:abdomen soft, non-tender and normal bowel sounds Musculoskeletal:no cyanosis of digits and no clubbing  NEURO: alert & oriented x 3 with fluent speech, no focal motor/sensory deficits; cautious gait with a walker.  Lab Results  Component Value Date   WBC 5.3 01/08/2014   HGB 10.5* 01/08/2014   HCT 34.0* 01/08/2014   MCV 96.0  01/08/2014   PLT 255 01/08/2014   NEUTROABS 3.9 01/08/2014      Chemistry      Component Value Date/Time   NA 140 01/08/2014 1234   NA 132* 11/11/2013 0524   K 4.2 01/08/2014 1234   K 3.7 11/11/2013 0524   CL 93* 11/11/2013 0524   CL 101 10/18/2012 1051   CO2 30* 01/08/2014 1234   CO2 27 11/11/2013 0524   BUN 11.9 01/08/2014 1234   BUN 14 11/11/2013 0524   CREATININE 0.6 01/08/2014 1234   CREATININE 0.56 11/11/2013 0524   CREATININE 0.69 08/11/2009 1145      Component Value Date/Time   CALCIUM 9.8 01/08/2014 1234   CALCIUM 8.8 11/11/2013 0524   ALKPHOS 61 01/08/2014 1234   ALKPHOS 40 11/11/2013 0524   AST 12 01/08/2014 1234   AST 11 11/11/2013 0524   ALT 10 01/08/2014 1234   ALT 7 11/11/2013 0524   BILITOT 0.49 01/08/2014 1234   BILITOT 0.3 11/11/2013 0524     RADIOGRAPHIC STUDIES: 10/30/2013  METASTATIC BONE SURVEY  COMPARISON: Metastatic survey dated 11/01/2012  FINDINGS: The bones are osteopenic. Focal area of lucency within the frontal region of the cranium. Finding has developed in the interim when compared to prior. Multilevel spondylosis throughout the cervical, thoracic, and lumbar  spine, stable. Stable areas of cement augmentation within the lower thoracic spine and lower lumbar spine. No appreciable destructive lytic lesions within the spine. There has been increase in the number of rib fractures on the left. These fractures have areas of callus, age indeterminate. No appreciable destructive lytic lesions. No appreciable rib fractures on the left. Osteoarthritic changes are again appreciated within the hips, knees, right ankle. There is no evidence of aggressive appearing lytic lesions within the appendicular skeleton. Diffuse advanced atherosclerotic calcifications appreciated. IMPRESSION:  Interval development of a lytic focus in the frontal region of the calvarium. No further regions destructive appearing lytic lesions. Stable diffuse spondylosis throughout the spine. Stable areas of cement  augmentation within the thoracic and lumbar spine Increase in the rib fractures on the left age indeterminate. Advanced atherosclerotic changes Multiple areas of advanced osteoarthritic changes.  11/09/2013 RIGHT HIP - COMPLETE 2+ VIEW  COMPARISON: CT from the previous day  FINDINGS: Minimally distracted avulsion fracture of the greater trochanter. No definite extension across the femoral neck or head. No dislocation. No other fracture or acute bone abnormality. Changes of kyphoplasty/ vertebroplasty in the lower lumbar spine. Extensive bilateral iliofemoral arterial calcifications. Mild diffuse osteopenia.  IMPRESSION: 1. Little definite change in appearance of right greater trochanter avulsion fracture.  ASSESSMENT: KYLEIGHA MARKERT 80 y.o. female with a history of Multiple myeloma - Plan: CBC with Differential, Comprehensive metabolic panel (Cmet) - [facility], Lactate dehydrogenase (LDH) - [facility], IgG, IgA, IgM, Kappa/lambda light chains   PLAN:  1. Multiple myeloma, progressive. --Her M-spike had increased and her IgA level was 2270 up 1060 on 08/14/2013.   Her IgA level is now 1,370 with Kappa:Lamda ratio of 63.44 on 11/20/2013.   We will continue pomalyst treatment given she received at least 2 prior therapies (including lenalidomide and bortezomib) and had demonstrated disease progression on or within 60 days of completion of the last therapy. We stopped Velcade, cytoxan and changed to pomalyst 2 mg daily for 21 days, then off for 7 days in combination with her  dexamethasone 20 mg weekly. She will continue on aspirin to reduce her risk of clots. The benefit of treatment including controlling her multiple myeloma and risks were discussed including but not limited to bone marrow suppression resulting in life threatening infections, anemia and fatigue, diarrhea, second malignancies and neuropathy.  She agreed to proceed with treatment.  If her MM progresses despite this treatment, we will consider a  carfilzomib-based treatment.   --We will follow SPEP plus IFE, UPEP plus IFE and Kappa Lambda light chains while on therapy monthly.   We obtained a skeletal survey on 10/30/2013 which demonstrated an interval development of a lytic focus in the frontal region of the calvarium.    2. S/p R greater trochanteric fracture. --She reports that her pain is stable.  Continued physical therapy as tolerated.   3. Hypoalbuminemia, improving. --Her albumin is 3.1 up from 2.6 up from 2.5 on 07/08 up from 2.1 on 6/29.    4. Anemia. --Secondary to number 1 plus or minus pomalyst.   She is asymptomatic.   5. Follow up. RTC in 4 weeks for evaluation for another treatment with CBC, CMET.   All questions were answered. The patient knows to call the clinic with any problems, questions or concerns. We can certainly see the patient much sooner if necessary.  I spent 15 minutes counseling the patient face to face. The total time spent in the appointment was 25 minutes.    Issac Moure, MD 01/08/2014 3:25 PM

## 2014-01-09 LAB — IGG, IGA, IGM
IGG (IMMUNOGLOBIN G), SERUM: 394 mg/dL — AB (ref 690–1700)
IgA: 716 mg/dL — ABNORMAL HIGH (ref 69–380)
IgM, Serum: 11 mg/dL — ABNORMAL LOW (ref 52–322)

## 2014-01-09 LAB — KAPPA/LAMBDA LIGHT CHAINS
KAPPA FREE LGHT CHN: 12.9 mg/dL — AB (ref 0.33–1.94)
Kappa:Lambda Ratio: 13.58 — ABNORMAL HIGH (ref 0.26–1.65)
Lambda Free Lght Chn: 0.95 mg/dL (ref 0.57–2.63)

## 2014-01-16 ENCOUNTER — Non-Acute Institutional Stay (SKILLED_NURSING_FACILITY): Payer: Medicare Other | Admitting: Internal Medicine

## 2014-01-16 ENCOUNTER — Encounter: Payer: Self-pay | Admitting: Internal Medicine

## 2014-01-16 DIAGNOSIS — R609 Edema, unspecified: Secondary | ICD-10-CM | POA: Insufficient documentation

## 2014-01-16 DIAGNOSIS — I509 Heart failure, unspecified: Secondary | ICD-10-CM

## 2014-01-16 DIAGNOSIS — I5042 Chronic combined systolic (congestive) and diastolic (congestive) heart failure: Secondary | ICD-10-CM

## 2014-01-16 NOTE — Progress Notes (Signed)
Patient ID: Christina Hopkins, female   DOB: May 21, 1925, 80 y.o.   MRN: [id number]   This is an acute visit.  Level of care skilled.  Facility AF.  Chief complaint-acute visit secondary to lower extremity edema.  History of present illness.  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  She does continue on a beta blocker as well as lisinopril-this has been stable for some time.  Apparently over the last week or so there's been some increased lower extremity edema--she is not complaining of any increased shortness of breath or chest pain-tonight she is resting in bed comfortably.  Family medical social history as been reviewed most recently progress note on 01/01/2014.  Medications reviewed per MAR.  She is not on a diuretic according to her daughter sometime in the distant past she had been on Lasix and/or spironolactone.  Review of systems  Gen. no complaints of fever or chills.  Respiratory-no complaints of increased shortness of breath even with exertion no cough.  Cardiac some history CHF combined aspirin does not complaining of any chest pain-she appears to have some increased lower extremity edema more so on the left.  GI does not complain of any nausea vomiting diarrhea constipation or abdominal pain.  Muscle skeletal does not complaining of joint pain.  Neurologic does not complaining of any syncopal type episodes dizziness or headache.  Psych she appears bright and alert does not complaining of depression or anxiety.  Physical exam.  Which is 97.5 pulse 65 respirations 20 blood pressure taken manually  [id number] his systolics are from the [id number].  In general this is a very pleasant frail elderly female in no distress resting in bed comfortably.  Her skin is warm and dry.  Chest she is kyphotic-- do not appreciate any congestion there is no labored breathing somewhat shallow air entry suspect some of this is because of kyphosis  Heart-is regular rate and rhythm without murmur gallop or rub.-- she does have some mild right and left lower extremity edema--more so on the left -- reduced pedal pulse on the left nonerythematous-nontender edema on the  Left and right-I did not note any sacral edema  Abdomen soft nontender protuberant positive bowel sounds.  Muscle skeletal is able to move all extremities x4 she does have kyphosis-I do not note any deformities of her extremities.  Neurologic is grossly intact her speech is clear cranial nerves intact.  Psych she is alert and appropriate a very pleasant individual.  Labs.  01/08/2014.  WBC 5.3 hemoglobin 10.5 platelets 255.  Sodium 140 potassium 4.2 BUN 11.9 creatinine 0.6 gas CO2 level XXX.  01/07/2014.  Sodium 136 potassium 3.6 BUN 14 creatinine 0.57.  10/25/2057 the hemoglobin was 9.9.  Assessment and plan.  #1 increased lower extremity edema--at this point this does not appear to be in acute change apparently this has been over the past week or so and waxes and wanes according to her daughter-secondary due to somewhat increased edema on the left will order a venous Doppler-also with her history CHF will order weights tomorrow and also 3 times a week notify provider gain greater than 3 pounds.  Also will order a chest x-ray rule  out any pulmonary edema -and order a metabolic panel as well as a BNP tomorrow.  Also will monitor vital signs every shift for 72 hours-again she is not in any distress but would like to keep an eye  on this with her history.  [id number]

## 2014-01-21 ENCOUNTER — Non-Acute Institutional Stay (SKILLED_NURSING_FACILITY): Payer: Medicare Other | Admitting: Internal Medicine

## 2014-01-21 ENCOUNTER — Other Ambulatory Visit: Payer: Self-pay | Admitting: *Deleted

## 2014-01-21 ENCOUNTER — Encounter: Payer: Self-pay | Admitting: Internal Medicine

## 2014-01-21 DIAGNOSIS — W19XXXD Unspecified fall, subsequent encounter: Secondary | ICD-10-CM

## 2014-01-21 DIAGNOSIS — W19XXXA Unspecified fall, initial encounter: Secondary | ICD-10-CM

## 2014-01-21 DIAGNOSIS — I5042 Chronic combined systolic (congestive) and diastolic (congestive) heart failure: Secondary | ICD-10-CM

## 2014-01-21 DIAGNOSIS — J189 Pneumonia, unspecified organism: Secondary | ICD-10-CM

## 2014-01-21 DIAGNOSIS — Z5189 Encounter for other specified aftercare: Secondary | ICD-10-CM

## 2014-01-21 DIAGNOSIS — I509 Heart failure, unspecified: Secondary | ICD-10-CM

## 2014-01-21 NOTE — Progress Notes (Signed)
Patient ID: Christina Hopkins, female   DOB: [birth year], 80 y.o.   MRN: [id number]   This is an acute visit.  Level of care skilled.  Facility AF.   Chief complaint-acute visit-followup infiltrate-followup fall   History of present illness .  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  She does continue on a beta blocker as well as lisinopril-this has been stable for some time.  Last week she apparently has some intermittent increased edema-I did see her and ordered a chest X. which did not show any pulmonary edema but did show an infiltrate-she was started on Avelox and is completing a course of this.  sh appears to be doing well in this regard.--Is not complaining of shortness of breath or cough As part of labs last week I did order a BMP she does have some history CHF as noted above-it was elevated at 1360-however I did review [facility] records and appears back in November her BNP was actually was 2180 Her weight has been stable---today 94.2 lbs was 92.8 last month .    Apparently she did have a fall recently and sustained a small abrasion to her right elbow-today when I saw her she did complain of some mid back pain-she is still ambulating with a walker appears to be doing well with this does not really complain of pain otherwise. .  Family medical social history as been reviewed most recently progress note on 01/01/2014.and 01-16-14   Medications reviewed per MAR .  She is not on a diuretic according to her daughter sometime in the distant past she had been on Lasix and/or spironolactone.   Review of systems  Gen. no  complaints of fever or chills.  Respiratory-no complaints of increased shortness of breath .  Cardiac some history CHF combined  does not complaining of any chest pain-.  GI does not complain of any nausea vomiting diarrhea constipation or abdominal pain.  Muscle skeletal does complain of some mid back pain.--And also some rib pain  Neurologic does not complaining of any syncopal type episodes dizziness or headache.  Psych she appears bright and alert does not complaining of depression or anxiety .  Physical exam.  Temperature 97.5 pulse 76 respirations 20 blood pressure 150/64 weight is 94.2 .  In general this is a very pleasant frail elderly female in no distress actually using her walker.  Her skin is warm and dry.--She does have a dry dressing placed over her right elbow abrasion   Chest she is kyphotic-- do not appreciate any congestion there is no labored breathing somewhat shallow air entry more so on the right  Heart-is regular rate and rhythm without murmur gallop or rub.-- she does have some mild lower extremity edema but this is fairly stable to somewhat improved from last week  Abdomen soft nontender protuberant positive bowel sounds.  Muscle skeletal is able to move all extremities x4 she does have kyphosis-I do not note any deformities of her extremities--she does have some tenderness to palpation of her mid back more so on the left.  Neurologic is grossly intact her speech is clear cranial nerves intact.  Psych she is alert and appropriate a  very pleasant individual.   Labs 01/14/2014.  Sodium 139 potassium 3.7 BUN 10 creatinine 0.6. CO-2 34  [id number] .  01/08/2014.  WBC 5.3 hemoglobin 10.5 platelets 255.  Sodium 140 potassium 4.2 BUN 11.9 creatinine 0.6 gas CO2 level XXX.   01/07/2014.  Sodium 136 potassium 3.6 BUN 14 creatinine 0.57.  10/25/2057 the hemoglobin was 9.9.  Assessment and plan.   #1 suspected pneumonia per infiltrate on recent x-ray-continues on  Avelox she appears to be stable in this regards-will update a chest x-ray secondary to some complaints of back pain and also apparently some rib discomfort   #2-history CHF-BNP is elevated but actually improved from November-clinically she appears stable in this regards-her weight appears to be stable-at this point continue to monitor regular weights have been ordered.  She continues on beta blocker as well as lisinopril.  Again a repeat chest x-ray would also give [place] some insight .  #3-history of fall-with no apparent injury other than a mild right elbow abrasion however she is complaining of some mid back pain and occasionally some rib discomfort we'll order again in x-ray of her thoracic and lumbar spine as well as chest  [id number]

## 2014-01-21 NOTE — Telephone Encounter (Signed)
THIS REFILL REQUEST FOR POMALYST WAS GIVEN TO DR.SEHBAI'S NURSE, KATHY BUYCK,RN.

## 2014-01-22 ENCOUNTER — Telehealth: Payer: Self-pay

## 2014-01-22 ENCOUNTER — Other Ambulatory Visit: Payer: Self-pay | Admitting: *Deleted

## 2014-01-22 DIAGNOSIS — C9000 Multiple myeloma not having achieved remission: Secondary | ICD-10-CM

## 2014-01-22 MED ORDER — POMALIDOMIDE 2 MG PO CAPS: 2.0000 mg | ORAL_CAPSULE | Freq: Every day | ORAL | Status: DC

## 2014-01-22 NOTE — Telephone Encounter (Signed)
diregard prior note about revlimid shipment, charted in error.

## 2014-01-22 NOTE — Telephone Encounter (Signed)
Received notification of shipment for delivery of revlimid on 01/23/14 from [facility]

## 2014-01-23 ENCOUNTER — Non-Acute Institutional Stay (SKILLED_NURSING_FACILITY): Payer: Medicare Other | Admitting: Internal Medicine

## 2014-01-23 ENCOUNTER — Encounter: Payer: Self-pay | Admitting: Internal Medicine

## 2014-01-23 DIAGNOSIS — I5042 Chronic combined systolic (congestive) and diastolic (congestive) heart failure: Secondary | ICD-10-CM

## 2014-01-23 DIAGNOSIS — I509 Heart failure, unspecified: Secondary | ICD-10-CM

## 2014-01-23 DIAGNOSIS — J189 Pneumonia, unspecified organism: Secondary | ICD-10-CM

## 2014-01-23 NOTE — Progress Notes (Signed)
Patient ID: Christina Hopkins, female   DOB: 02/16/1926, 80 y.o.   MRN: [id number] This is an acute visit.  Level of care skilled.  Facility [facility].   Chief complaint-acute visit-followup infiltrate-followup fall   History of present illness  .  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  She does continue on a beta blocker as well as lisinopril-this has been stable for some time.  Last week she apparently has some intermittent increased edema-I did see her and ordered a chest X. which did not show any pulmonary edema but did show an infiltrate-she was started on Avelox is completing a course of this.  sh appears to be doing well in this regard.--Is not complaining of shortness of breath or cough  As part of labs last week I did order a BMP she does have some history CHF as noted above-it was elevated at 1360-however I did review [facility] records and appears back in November her BNP was actually was 2180  Her weight has been stable-- .  Apparently she did have a fall recently and sustained a small abrasion to her right elbow- when I saw her she did complain of some mid back pain-she is still ambulating with a walker appears to be doing well with this does not really complain of pain otherwise--x-rays were ordered including a chest x-ray thoracic and lumbosacral spine-the spine x-rays did not really show any acute change he did show osteopenia and degenerative joint disease.  Her chest x-ray showed the persistence of a left lower lobe infiltrate with effusion-little change from the x-ray 5 days earlier-  I did reevaluate her today it appears  that pain is doing a bit better of her back-she is not complaining of any shortness of breath or increased cough respiratory status appears to be stable--l certainly does not appear to be declining in this regards her vital signs are stable she was actually up ambulating with her walker when I walked in the room.  .  Family medical social history as been reviewed most recently progress note on 01/01/2014.and 01-16-14  Medications reviewed per MAR  .  She is not on a diuretic according to her daughter sometime in the distant past she had been on Lasix and/or spironolactone.   Review of systems  Gen. no complaints of fever or chills.  Respiratory-no complaints of increased shortness of breath .  Cardiac some history CHF combined does not complaining of any chest pain-.  GI does not complain of any nausea vomiting diarrhea constipation or abdominal pain.  Muscle skeletal does complain of some left thorax pain-says this is more associated with movement at times does not appear to limit her mobility-  Neurologic does not complaining of any syncopal type episodes dizziness or headache.  Psych she appears bright and alert does not complaining of depression or anxiety  .  Physical exam.  Temperature 97.3-pulse 65-respirations 16-blood pressure 123/59 Dass O2 saturations have been in the 90s  .  In general this is a very pleasant frail elderly female in no distress actually using her walker.  Her skin is warm and dry.-  Chest she is kyphotic-- do not appreciate any congestion there  is no labored breathing somewhat shallow air entry Heart-is regular rate and rhythm without murmur gallop or rub.-- she does have some mild lower extremity edema but this is  stable Abdomen soft nontender protuberant positive bowel sounds.  Muscle skeletal is able to move all extremities x4 she does have kyphosis-I do not note any deformities of her extremities--she does have some tenderness to palpation of her mid back but  this appears a bit less than it did earlier this week.  Neurologic is grossly intact her speech is clear cranial nerves intact.  Psych she is alert and appropriate a very pleasant individual.   Labs   01/22/2014.  WBC 4.1-hemoglobin 8.7-platelets 170.   01/14/2014.  Sodium 139 potassium 3.7 BUN 10 creatinine 0.6. CO-2 34  BMP-1360  .  01/08/2014.  WBC 5.3 hemoglobin 10.5 platelets 255.  Sodium 140 potassium 4.2 BUN 11.9 creatinine 0.6 gas CO2 level XXX.  01/07/2014.  Sodium 136 potassium 3.6 BUN 14 creatinine 0.57.  10/25/2057 the hemoglobin was 9.9.   Assessment and plan.  #1 suspected pneumonia per infiltrate on recent x-ray-will complete Avelox tomorrow she appears to be stable in this regards- #2-history CHF-BNP is elevated but actually improved from November-clinically she appears stable in this regards--at this point continue to monitor regular weights have been ordered.  She continues on beta blocker as well as lisinopril  Of note...l.  Repeat x-ray does show persistence of the effusion on the left although x-ray was done only 5 days after initial x-ray and thus is not entirely surprising-at this point we'll monitor clinically she appears stable--no fever-no increased cough or complaints of shortness of breath  .  #3-history of fall-x-rays have not shown any acute changes to affected areas  [id number]

## 2014-01-24 NOTE — Telephone Encounter (Signed)
RECEIVED A FAX FROM [facility] CONCERNING A CONFIRMATION OF PRESCRIPTION SHIPMENT FOR POMALYST ON 01/23/14.

## 2014-01-29 ENCOUNTER — Other Ambulatory Visit: Payer: Self-pay | Admitting: Medical Oncology

## 2014-01-30 ENCOUNTER — Other Ambulatory Visit: Payer: Self-pay | Admitting: *Deleted

## 2014-01-30 MED ORDER — HYDROCODONE-ACETAMINOPHEN 5-325 MG PO TABS: ORAL_TABLET | ORAL | Status: DC

## 2014-01-30 NOTE — Telephone Encounter (Signed)
[facility] 

## 2014-02-05 ENCOUNTER — Telehealth: Payer: Self-pay | Admitting: Hematology

## 2014-02-05 ENCOUNTER — Encounter: Payer: Self-pay | Admitting: Hematology

## 2014-02-05 ENCOUNTER — Other Ambulatory Visit (HOSPITAL_BASED_OUTPATIENT_CLINIC_OR_DEPARTMENT_OTHER): Payer: Medicare Other

## 2014-02-05 ENCOUNTER — Ambulatory Visit (HOSPITAL_BASED_OUTPATIENT_CLINIC_OR_DEPARTMENT_OTHER): Payer: Medicare Other | Admitting: Hematology

## 2014-02-05 VITALS — BP 125/48 | HR 65 | Temp 98.3°F | Resp 16 | Ht <= 58 in | Wt 93.9 lb

## 2014-02-05 DIAGNOSIS — E785 Hyperlipidemia, unspecified: Secondary | ICD-10-CM

## 2014-02-05 DIAGNOSIS — E612 Magnesium deficiency: Secondary | ICD-10-CM

## 2014-02-05 DIAGNOSIS — K922 Gastrointestinal hemorrhage, unspecified: Secondary | ICD-10-CM

## 2014-02-05 DIAGNOSIS — R1013 Epigastric pain: Secondary | ICD-10-CM

## 2014-02-05 DIAGNOSIS — C9000 Multiple myeloma not having achieved remission: Secondary | ICD-10-CM

## 2014-02-05 DIAGNOSIS — Z85038 Personal history of other malignant neoplasm of large intestine: Secondary | ICD-10-CM

## 2014-02-05 DIAGNOSIS — D519 Vitamin B12 deficiency anemia, unspecified: Secondary | ICD-10-CM

## 2014-02-05 DIAGNOSIS — I10 Essential (primary) hypertension: Secondary | ICD-10-CM

## 2014-02-05 DIAGNOSIS — R609 Edema, unspecified: Secondary | ICD-10-CM

## 2014-02-05 DIAGNOSIS — IMO0001 Reserved for inherently not codable concepts without codable children: Secondary | ICD-10-CM

## 2014-02-05 DIAGNOSIS — E440 Moderate protein-calorie malnutrition: Secondary | ICD-10-CM

## 2014-02-05 DIAGNOSIS — I5042 Chronic combined systolic (congestive) and diastolic (congestive) heart failure: Secondary | ICD-10-CM

## 2014-02-05 DIAGNOSIS — E538 Deficiency of other specified B group vitamins: Secondary | ICD-10-CM

## 2014-02-05 DIAGNOSIS — E871 Hypo-osmolality and hyponatremia: Secondary | ICD-10-CM

## 2014-02-05 DIAGNOSIS — D509 Iron deficiency anemia, unspecified: Secondary | ICD-10-CM

## 2014-02-05 DIAGNOSIS — E1165 Type 2 diabetes mellitus with hyperglycemia: Secondary | ICD-10-CM

## 2014-02-05 DIAGNOSIS — D696 Thrombocytopenia, unspecified: Secondary | ICD-10-CM

## 2014-02-05 DIAGNOSIS — D630 Anemia in neoplastic disease: Secondary | ICD-10-CM

## 2014-02-05 DIAGNOSIS — K449 Diaphragmatic hernia without obstruction or gangrene: Secondary | ICD-10-CM

## 2014-02-05 DIAGNOSIS — E876 Hypokalemia: Secondary | ICD-10-CM

## 2014-02-05 DIAGNOSIS — D638 Anemia in other chronic diseases classified elsewhere: Secondary | ICD-10-CM

## 2014-02-05 DIAGNOSIS — D518 Other vitamin B12 deficiency anemias: Secondary | ICD-10-CM

## 2014-02-05 LAB — COMPREHENSIVE METABOLIC PANEL (CC13)
ALK PHOS: 61 U/L (ref 40–150)
AST: 8 U/L (ref 5–34)
Albumin: 2.8 g/dL — ABNORMAL LOW (ref 3.5–5.0)
Anion Gap: 7 mEq/L (ref 3–11)
BILIRUBIN TOTAL: 0.37 mg/dL (ref 0.20–1.20)
BUN: 10.5 mg/dL (ref 7.0–26.0)
CO2: 30 mEq/L — ABNORMAL HIGH (ref 22–29)
Calcium: 9.5 mg/dL (ref 8.4–10.4)
Chloride: 102 mEq/L (ref 98–109)
Creatinine: 0.7 mg/dL (ref 0.6–1.1)
Glucose: 145 mg/dl — ABNORMAL HIGH (ref 70–140)
Potassium: 4 mEq/L (ref 3.5–5.1)
Sodium: 139 mEq/L (ref 136–145)
Total Protein: 6.5 g/dL (ref 6.4–8.3)

## 2014-02-05 LAB — CBC WITH DIFFERENTIAL/PLATELET
BASO%: 1.3 % (ref 0.0–2.0)
Basophils Absolute: 0.1 10*3/uL (ref 0.0–0.1)
EOS%: 1.4 % (ref 0.0–7.0)
Eosinophils Absolute: 0.1 10*3/uL (ref 0.0–0.5)
HCT: 29.3 % — ABNORMAL LOW (ref 34.8–46.6)
HGB: 9.2 g/dL — ABNORMAL LOW (ref 11.6–15.9)
LYMPH%: 20.8 % (ref 14.0–49.7)
MCH: 30.4 pg (ref 25.1–34.0)
MCHC: 31.4 g/dL — AB (ref 31.5–36.0)
MCV: 96.9 fL (ref 79.5–101.0)
MONO#: 0.9 10*3/uL (ref 0.1–0.9)
MONO%: 13.1 % (ref 0.0–14.0)
NEUT#: 4.4 10*3/uL (ref 1.5–6.5)
NEUT%: 63.4 % (ref 38.4–76.8)
PLATELETS: 230 10*3/uL (ref 145–400)
RBC: 3.02 10*6/uL — AB (ref 3.70–5.45)
RDW: 20.4 % — ABNORMAL HIGH (ref 11.2–14.5)
WBC: 7 10*3/uL (ref 3.9–10.3)
lymph#: 1.5 10*3/uL (ref 0.9–3.3)

## 2014-02-05 LAB — LACTATE DEHYDROGENASE (CC13): LDH: 139 U/L (ref 125–245)

## 2014-02-05 MED ORDER — CYANOCOBALAMIN 1000 MCG/ML IJ SOLN
1000.0000 ug | Freq: Once | INTRAMUSCULAR | Status: AC
  Administered 2014-02-05: 1000 ug via INTRAMUSCULAR

## 2014-02-05 MED ORDER — DARBEPOETIN ALFA-POLYSORBATE 500 MCG/ML IJ SOLN
300.0000 ug | Freq: Once | INTRAMUSCULAR | Status: AC
  Administered 2014-02-05: 300 ug via SUBCUTANEOUS
  Filled 2014-02-05: qty 1

## 2014-02-05 NOTE — Patient Instructions (Signed)
Vitamin B12 Injections Every person needs vitamin B12. A deficiency develops when the body does not get enough of it. One way to overcome this is by getting B12 shots (injections). A B12 shot puts the vitamin directly into muscle tissue. This avoids any problems your body might have in absorbing it from food or a pill. In some people, the body has trouble using the vitamin correctly. This can cause a B12 deficiency. Not consuming enough of the vitamin can also cause a deficiency. Getting enough vitamin B12 can be hard for elderly people. Sometimes, they do not eat a well-balanced diet. The elderly are also more likely than younger people to have medical conditions or take medications that can lead to a deficiency. WHAT DOES VITAMIN B12 DO? Vitamin B12 does many things to help the body work right:  It helps the body make healthy red blood cells.  It helps maintain nerve cells.  It is involved in the body's process of converting food into energy (metabolism).  It is needed to make the genetic material in all cells (DNA). VITAMIN B12 FOOD SOURCES Most people get plenty of vitamin B12 through the foods they eat. It is present in:  Meat, fish, poultry, and eggs.  Milk and milk products.  It also is added when certain foods are made, including some breads, cereals and yogurts. The food is then called "fortified". CAUSES The most common causes of vitamin B12 deficiency are:  Pernicious anemia. The condition develops when the body cannot make enough healthy red blood cells. This stems from a lack of a protein made in the stomach (intrinsic factor). People without this protein cannot absorb enough vitamin B12 from food.  Malabsorption. This is when the body cannot absorb the vitamin. It can be caused by:  Pernicious anemia.  Surgery to remove part or all of the stomach can lead to malabsorption. Removal of part or all of the small intestine can also cause malabsorption.  Vegetarian diet.  People who are strict about not eating foods from animals could have trouble taking in enough vitamin B12 from diet alone.  Medications. Some medicines have been linked to B12 deficiency, such as Metformin (a drug prescribed for type 2 diabetes). Long-term use of stomach acid suppressants also can keep the vitamin from being absorbed.  Intestinal problems such as inflammatory bowel disease. If there are problems in the digestive tract, vitamin B12 may not be absorbed in good enough amounts. SYMPTOMS People who do not get enough B12 can develop problems. These can include:  Anemia. This is when the body has too few red blood cells. Red blood cells carry oxygen to the rest of the body. Without a healthy supply of red blood cells, people can feel:  Tired (fatigued).  Weak.  Severe anemia can cause:  Shortness of breath.  Dizziness.  Rapid heart rate.  Paleness.  Other Vitamin B12 deficiency symptoms include:  Diarrhea.  Numbness or tingling in the hands or feet.  Loss of appetite.  Confusion.  Sores on the tongue or in the mouth. LET YOUR CAREGIVER KNOW ABOUT:  Any allergies. It is very important to know if you are allergic or sensitive to cobalt. Vitamin B12 contains cobalt.  Any history of kidney disease.  All medications you are taking. Include prescription and over-the-counter medicines, herbs and creams.  Whether you are pregnant or breast-feeding.  If you have Leber's disease, a hereditary eye condition, vitamin B12 could make it worse. RISKS AND COMPLICATIONS Reactions to an injection are   usually temporary. They might include:  Pain at the injection site.  Redness, swelling or tenderness at the site.  Headache, dizziness or weakness.  Nausea, upset stomach or diarrhea.  Numbness or tingling.  Fever.  Joint pain.  Itching or rash. If a reaction does not go away in a short while, talk with your healthcare provider. A change in the way the shots are  given, or where they are given, might need to be made. BEFORE AN INJECTION To decide whether B12 injections are right for you, your healthcare provider will probably:  Ask about your medical history.  Ask questions about your diet.  Ask about symptoms such as:  Have you felt weak?  Do you feel unusually tired?  Do you get dizzy?  Order blood tests. These may include a test to:  Check the level of red cells in your blood.  Measure B12 levels.  Check for the presence of intrinsic factor. VITAMIN B12 INJECTIONS How often you will need a vitamin B12 injection will depend on how severe your deficiency is. This also will affect how long you will need to get them. People with pernicious anemia usually get injections for their entire life. Others might get them for a shorter period. For many people, injections are given daily or weekly for several weeks. Then, once B12 levels are normal, injections are given just once a month. If the cause of the deficiency can be fixed, the injections can be stopped. Talk with your healthcare provider about what you should expect. For an injection:  The injection site will be cleaned with an alcohol swab.  Your healthcare provider will insert a needle directly into a muscle. Most any muscle can be used. Most often, an arm muscle is used. A buttocks muscle can also be used. Many people say shots in that area are less painful.  A small adhesive bandage may be put over the injection site. It usually can be taken off in an hour or less. Injections can be given by your healthcare provider. In some cases, family members give them. Sometimes, people give them to themselves. Talk with your healthcare provider about what would be best for you. If someone other than your healthcare provider will be giving the shots, the person will need to be trained to give them correctly. HOME CARE INSTRUCTIONS   You can remove the adhesive bandage within an hour of getting a  shot.  You should be able to go about your normal activities right away.  Avoid drinking large amounts of alcohol while taking vitamin B12 shots. Alcohol can interfere with the body's use of the vitamin. SEEK MEDICAL CARE IF:   Pain, redness, swelling or tenderness at the injection site does not get better or gets worse.  Headache, dizziness or weakness does not go away.  You develop a fever of more than 100.5 F (38.1 C). SEEK IMMEDIATE MEDICAL CARE IF:   You have chest pain.  You develop shortness of breath.  You have muscle weakness that gets worse.  You develop numbness, weakness or tingling on one side or one area of the body.  You have symptoms of an allergic reaction, such as:  Hives.  Difficulty breathing.  Swelling of the lips, face, tongue or throat.  You develop a fever of more than 102.0 F (38.9 C). MAKE SURE YOU:   Understand these instructions.  Will watch your condition.  Will get help right away if you are not doing well or get worse. Document   Released: 07/29/2008 Document Revised: 07/25/2011 Document Reviewed: 07/29/2008 [facility] Patient Information 2015 [place], [place]. This information is not intended to replace advice given to you by your health care provider. Make sure you discuss any questions you have with your health care provider. Darbepoetin Alfa injection What is this medicine? DARBEPOETIN ALFA (dar be POE e tin AL fa) helps your body make more red blood cells. It is used to treat anemia caused by chronic kidney failure and chemotherapy. This medicine may be used for other purposes; ask your health care provider or pharmacist if you have questions. COMMON BRAND NAME(S): Aranesp What should I tell my health care provider before I take this medicine? They need to know if you have any of these conditions: -blood clotting disorders or history of blood clots -cancer patient not on chemotherapy -cystic fibrosis -heart disease, such as angina,  heart failure, or a history of a heart attack -hemoglobin level of 12 g/dL or greater -high blood pressure -low levels of folate, iron, or vitamin B12 -seizures -an unusual or allergic reaction to darbepoetin, erythropoietin, albumin, hamster proteins, latex, other medicines, foods, dyes, or preservatives -pregnant or trying to get pregnant -breast-feeding How should I use this medicine? This medicine is for injection into a vein or under the skin. It is usually given by a health care professional in a hospital or clinic setting. If you get this medicine at home, you will be taught how to prepare and give this medicine. Do not shake the solution before you withdraw a dose. Use exactly as directed. Take your medicine at regular intervals. Do not take your medicine more often than directed. It is important that you put your used needles and syringes in a special sharps container. Do not put them in a trash can. If you do not have a sharps container, call your pharmacist or healthcare provider to get one. Talk to your pediatrician regarding the use of this medicine in children. While this medicine may be used in children as young as 1 year for selected conditions, precautions do apply. Overdosage: If you think you have taken too much of this medicine contact a poison control center or emergency room at once. NOTE: This medicine is only for you. Do not share this medicine with others. What if I miss a dose? If you miss a dose, take it as soon as you can. If it is almost time for your next dose, take only that dose. Do not take double or extra doses. What may interact with this medicine? Do not take this medicine with any of the following medications: -epoetin alfa This list may not describe all possible interactions. Give your health care provider a list of all the medicines, herbs, non-prescription drugs, or dietary supplements you use. Also tell them if you smoke, drink alcohol, or use illegal  drugs. Some items may interact with your medicine. What should I watch for while using this medicine? Visit your prescriber or health care professional for regular checks on your progress and for the needed blood tests and blood pressure measurements. It is especially important for the doctor to make sure your hemoglobin level is in the desired range, to limit the risk of potential side effects and to give you the best benefit. Keep all appointments for any recommended tests. Check your blood pressure as directed. Ask your doctor what your blood pressure should be and when you should contact him or her. As your body makes more red blood cells, you may need to  take iron, folic acid, or vitamin B supplements. Ask your doctor or health care provider which products are right for you. If you have kidney disease continue dietary restrictions, even though this medication can make you feel better. Talk with your doctor or health care professional about the foods you eat and the vitamins that you take. What side effects may I notice from receiving this medicine? Side effects that you should report to your doctor or health care professional as soon as possible: -allergic reactions like skin rash, itching or hives, swelling of the face, lips, or tongue -breathing problems -changes in vision -chest pain -confusion, trouble speaking or understanding -feeling faint or lightheaded, falls -high blood pressure -muscle aches or pains -pain, swelling, warmth in the leg -rapid weight gain -severe headaches -sudden numbness or weakness of the face, arm or leg -trouble walking, dizziness, loss of balance or coordination -seizures (convulsions) -swelling of the ankles, feet, hands -unusually weak or tired Side effects that usually do not require medical attention (report to your doctor or health care professional if they continue or are bothersome): -diarrhea -fever, chills (flu-like symptoms) -headaches -nausea,  vomiting -redness, stinging, or swelling at site where injected This list may not describe all possible side effects. Call your doctor for medical advice about side effects. You may report side effects to FDA at 1-800-FDA-1088. Where should I keep my medicine? Keep out of the reach of children. Store in a refrigerator between 2 and 8 degrees C (36 and 46 degrees F). Do not freeze. Do not shake. Throw away any unused portion if using a single-dose vial. Throw away any unused medicine after the expiration date. NOTE: This sheet is a summary. It may not cover all possible information. If you have questions about this medicine, talk to your doctor, pharmacist, or health care provider.  2015, [employer]. (2008-04-15 10:23:57)

## 2014-02-05 NOTE — Telephone Encounter (Signed)
gv adn printed appt sched and avs for pt for OCT °

## 2014-02-05 NOTE — Progress Notes (Signed)
[facility] ONCOLOG[facility] OFFICE PROGRESS NOTE 02/05/14  Christina Frees, MD [address] [place] [address]  DIAGNOSIS: Multiple Myeloma  Chief Complaint  Patient presents with  . Follow-up   DIAGNOSIS:  1. Multiple myeloma.  2. Anemia secondary to vitamin B12 deficiency.   PAST THERAPY:  Revlimid (08/2009-08/2012)  Zometa (02/2010 - 02/2012)  Velcade (05/2011 -10/09/2013)  Cytoxan 08/2012 - 10/09/2013)  Decadron (08/2012 - present)   CURRENT THERAPY:  1. Pomalyst 2 mg daily for 1 - 21 days and then off 7 days started on 10/25/2013. She is in middle of cycle 01/25/14 through 02/14/14. Cycle 5 starts on 02/22/14 2. Decadron 20 mg weekly (every Wednesday) 2. Vitamin B12 mg IM monthly.  3. Aranesp 300 mcg subcu every 2 weeks for hemoglobin less than or equal to 10.   Interim History: The patient is a pleasant 80 y.o. female who presented for follow up visit. She was last seen by Dr Juliann Mule on 01/08/2014. Today, she is accompanied by her daughter [name].  She continues rehab for a right greater trochanter frx (on observation, not felt to require surgical intervention).  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  She restarted cycle #3 of her pomalyst and is mid-cycle.    She denies any r fevers or chills. She denies focal neurological symptoms, headaches or visual changes, abdominal or urinary. Her appetite and weight is stable.    She reported pain in back,neck and shoulders; numbness in fingers and feet, and dry skin. The patient denied fever, chills, night sweats, change in appetite or weight. She denied odynophagia or dysphagia. No chest pain, palpitations, abdominal pain, vomiting, constipation, hematochezia. The patient denied dysuria, nocturia, polyuria, hematuria, myalgia, tingling, psychiatric problems. She has some bruising in left leg superficial from steroids I presume. She is a diabetic. She uses walker for ambulation and does not need oxygen any  more.  MEDICAL HISTORY: Past Medical History  Diagnosis Date  . Hypertension   . Diabetes mellitus   . Anemia   . Osteoporosis   . Dyslipidemia   . Myocardial infarction   . Coronary artery disease   . Shortness of breath   . GERD (gastroesophageal reflux disease)   . Wears glasses   . colon ca dx'd 2003    surg only  . Multiple myeloma dx'd 2009    oral chemo ongoing    INTERIM HISTORY: has Multiple myeloma; Anemia of chronic disease; Magnesium deficiency; Type II or unspecified type diabetes mellitus without mention of complication, uncontrolled; Hypertension; Coronary artery disease with history of MI; Fall; Fracture of multiple ribs; PNA (pneumonia); Thrombocytopenia, unspecified; Hyponatremia; Moderate protein-calorie malnutrition; Hypomagnesemia; Hyperlipidemia; Hypokalemia; GI bleed; Acute GI bleeding; Chronic combined systolic and diastolic congestive heart failure; Abdominal pain, epigastric ventral hernia; Hiatal hernia; History of colon cancer; Greater trochanter fracture; Hip fracture; UTI (urinary tract infection); and Edema on her problem list.    ALLERGIES:  is allergic to gemfibrozil; nifedipine; and questran.  MEDICATIONS: has a current medication list which includes the following prescription(s): acyclovir, magic mouthwash w/lidocaine, bisacodyl, calcium carbonate, camphor-menthol-capsicum, carvedilol, cholecalciferol, cyanocobalamin, cyclobenzaprine, dexamethasone, hydrocodone-acetaminophen, lisinopril, loperamide, magnesium hydroxide, magnesium oxide, metformin, multivitamin with minerals, nystatin, pantoprazole, polyethyl glycol-propyl glycol, pomalidomide, prochlorperazine, and sodium chloride, and the following Facility-Administered Medications: cyanocobalamin, darbepoetin, and darbepoetin alfa-polysorbate.  SURGICAL HISTORY:  Past Surgical History  Procedure Laterality Date  . Coronary artery bypass graft  08/2001    4 vessel  . Colon surgery  2003  .  Tonsillectomy    . Appendectomy    . Abdominal hysterectomy    . Back surgery    . Eye surgery      cataracts  . Direct laryngoscopy with radiaesse injection Left 08/13/2012    Procedure: LEFT VOCAL CORD RADIESSE AUGMENTATION LARYNGOSCOPY ;  Surgeon: Jodi Marble, MD;  Location: [facility];  Service: ENT;  Laterality: Left;  . Colonoscopy N/A 06/13/2013    Procedure: COLONOSCOPY;  Surgeon: Jeryl Columbia, MD;  Location: [facility];  Service: Endoscopy;  Laterality: N/A;   Problem List:  1. Multiple myeloma, initially presenting as an IgA kappa monoclonal gammopathy in February 2009. 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 This was felt to be an adverse prognostic determinant. Bone marrow on 08/31/2007 showed 46% plasma cells. A repeat bone marrow on 08/05/2009 showed 73% plasma cells. Treatment with Revlimid was started in April 2011. Zometa was started in October 2011 and 2 years of treatment were concluded on 02/23/2012. We had previously started Aranesp for the patient's anemia in May of 2010. Most recent metastatic bone survey was on 08/05/2009. Other x-rays since then are available. Maximum IgA level was 2080 on 08/11/2009. Subcutaneous Velcade was added to the patient's treatment program on 06/14/2011 because of a rising IgA level 1080 on 06/02/2011. The patient had been receiving Velcade every 2 weeks in combination with Revlimid during the early months of 2014. The patient continues to demonstrate progressive disease as evidenced by rising IgA and serum kappa levels. Revlimid was discontinued in April 2014. The patient had received Revlimid for about 3 years from April 2011 through April 2014. As of 08/29/2012, the patient was receiving Velcade, Cytoxan and Decadron every 2 weeks. The patient has also been receiving Aranesp 300 mcg subcu every 2 weeks for hemoglobin less than or equal to 10.  Her VCD was discontinued due to progression and she started Pomalyst plus  dexamethasone on 10/25/2013. She is on cycle 4 now. 2. Anemia secondary to vitamin B12 deficiency. The patient had been on oral vitamin B12. However, her vitamin B12 level fell to 284 on 07/26/2011 and vitamin B12 shots 1000 mcg given IM every month was resumed on 09/06/2011.  3. Anemia secondary to iron deficiency.   REVIEW OF SYSTEMS:   Constitutional: Denies fevers, chills or abnormal weight loss Eyes: Denies blurriness of vision Ears, nose, mouth, throat, and face: Denies mucositis or sore throat Respiratory: Denies cough, dyspnea or wheezes Cardiovascular: Denies palpitation, chest discomfort or lower extremity swelling Gastrointestinal:  Denies nausea, heartburn;  positive for constipation Skin: Denies abnormal skin rashes Lymphatics: Denies new lymphadenopathy or easy bruising Neurological:Denies numbness, tingling or new weaknesses Behavioral/Psych: Mood is stable, no new changes  All other systems were reviewed with the patient and are negative.  PHYSICAL EXAMINATION: ECOG PERFORMANCE STATUS: 1-2  Blood pressure 125/48, pulse 65, temperature 98.3 F (36.8 C), temperature source Oral, resp. rate 16, height 4' 8"  (1.422 m), weight 93 lb 14.4 oz (42.593 kg), SpO2 95.00%.  GENERAL:alert, no distress and comfortable; Diminished hearing; Kyphotic SKIN: skin color, texture, turgor are normal, no rashes or significant lesions  EYES: normal, Conjunctiva are pink and non-injected, sclera clear OROPHARYNX:no exudate, no erythema and lips, buccal mucosa, and tongue normal  NECK: supple, thyroid normal size, non-tender, without nodularity LYMPH:  no palpable lymphadenopathy in the cervical, axillary or supraclavicular LUNGS: clear to auscultation and percussion with normal breathing effort HEART: regular rate & rhythm and no murmurs and no lower extremity edema ABDOMEN:abdomen soft,  non-tender and normal bowel sounds Musculoskeletal:no cyanosis of digits and no clubbing, mild bruising in  left leg noted NEURO: alert & oriented x 3 with fluent speech, no focal motor/sensory deficits; cautious gait with a walker.   Lab Results  Component Value Date   WBC 7.0 02/05/2014   HGB 9.2* 02/05/2014   HCT 29.3* 02/05/2014   MCV 96.9 02/05/2014   PLT 230 02/05/2014   NEUTROABS 4.4 02/05/2014      Chemistry      Component Value Date/Time   NA 139 02/05/2014 0918   NA 132* 11/11/2013 0524   K 4.0 02/05/2014 0918   K 3.7 11/11/2013 0524   CL 93* 11/11/2013 0524   CL 101 10/18/2012 1051   CO2 30* 02/05/2014 0918   CO2 27 11/11/2013 0524   BUN 10.5 02/05/2014 0918   BUN 14 11/11/2013 0524   CREATININE 0.7 02/05/2014 0918   CREATININE 0.56 11/11/2013 0524   CREATININE 0.69 08/11/2009 1145      Component Value Date/Time   CALCIUM 9.5 02/05/2014 0918   CALCIUM 8.8 11/11/2013 0524   ALKPHOS 61 02/05/2014 0918   ALKPHOS 40 11/11/2013 0524   AST 8 02/05/2014 0918   AST 11 11/11/2013 0524   ALT <6 02/05/2014 0918   ALT 7 11/11/2013 0524   BILITOT 0.37 02/05/2014 0918   BILITOT 0.3 11/11/2013 0524     Myeloma markers from today drawn and are pending. Listed below we were seeing very good response in light chains and IgA levels.         RADIOGRAPHIC STUDIES: 10/30/2013  METASTATIC BONE SURVEY  COMPARISON: Metastatic survey dated 11/01/2012  FINDINGS: The bones are osteopenic. Focal area of lucency within the frontal region of the cranium. Finding has developed in the interim when compared to prior. Multilevel spondylosis throughout the cervical, thoracic, and lumbar  spine, stable. Stable areas of cement augmentation within the lower thoracic spine and lower lumbar spine. No appreciable destructive lytic lesions within the spine. There has been increase in the number of rib fractures on the left. These fractures have areas of callus, age indeterminate. No appreciable destructive lytic lesions. No appreciable rib fractures on the left. Osteoarthritic changes are again appreciated within the hips,  knees, right ankle. There is no evidence of aggressive appearing lytic lesions within the appendicular skeleton. Diffuse advanced atherosclerotic calcifications appreciated. IMPRESSION:  Interval development of a lytic focus in the frontal region of the calvarium. No further regions destructive appearing lytic lesions. Stable diffuse spondylosis throughout the spine. Stable areas of cement augmentation within the thoracic and lumbar spine Increase in the rib fractures on the left age indeterminate. Advanced atherosclerotic changes Multiple areas of advanced osteoarthritic changes.  11/09/2013 RIGHT HIP - COMPLETE 2+ VIEW  COMPARISON: CT from the previous day  FINDINGS: Minimally distracted avulsion fracture of the greater trochanter. No definite extension across the femoral neck or head. No dislocation. No other fracture or acute bone abnormality. Changes of kyphoplasty/ vertebroplasty in the lower lumbar spine. Extensive bilateral iliofemoral arterial calcifications. Mild diffuse osteopenia.  IMPRESSION: 1. Little definite change in appearance of right greater trochanter avulsion fracture.  ASSESSMENT: Christina Hopkins 80 y.o. female with a history of Multiple Myeloma.   PLAN:  1. Multiple myeloma, progressive. --Her M-spike had increased and her IgA level was 2270 up 1060 on 08/14/2013.   Her IgA level was 716 down from 1,370 with Kappa:Lamda ratio of 13 down from 63.44 on 11/20/2013.   We will continue pomalyst treatment  given she received at least 2 prior therapies (including lenalidomide and bortezomib) and had demonstrated disease progression on or within 60 days of completion of the last therapy. We stopped Velcade, cytoxan and changed to pomalyst 2 mg daily for 21 days, then off for 7 days in combination with her dexamethasone 20 mg weekly. She is not taking aspirin because of the GI bleed she had in Jan 2015 as per daughter. The benefit of treatment including controlling her multiple myeloma and  risks were discussed including but not limited to bone marrow suppression resulting in life threatening infections, anemia and fatigue, diarrhea, second malignancies and neuropathy.  She agreed to proceed with treatment.  If her MM progresses despite this treatment, we will consider a carfilzomib-based treatment.   --We will follow SPEP plus IFE, UPEP plus IFE and Kappa Lambda light chains while on therapy monthly.   We obtained a skeletal survey on 10/30/2013 which demonstrated an interval development of a lytic focus in the frontal region of the calvarium.    2. S/p R greater trochanteric fracture. --She reports that her pain is stable.  Continued physical therapy as tolerated.   3. Hypoalbuminemia, improving. --Her albumin was 3.1 up from 2.6 up from 2.5 on 07/08 up from 2.1 on 6/29.    4. Anemia. --Secondary to number 1 plus or minus pomalyst.   She is asymptomatic. She will get Aranesp and B12 shot in office today.  5. Follow up. RTC in 4 weeks for evaluation for another treatment with CBC, CMET and myeloma markers  All questions were answered. The patient knows to call the clinic with any problems, questions or concerns. We can certainly see the patient much sooner if necessary.  I spent 30 minutes counseling the patient face to face. The total time spent in the appointment was 35 minutes as this was my first encounter with patient.    Bernadene Bell, MD Medical Hematologist/Oncologist [place] Pager: [phone] Office No: [phone]

## 2014-02-07 LAB — IGG, IGA, IGM
IgA: 1180 mg/dL — ABNORMAL HIGH (ref 69–380)
IgG (Immunoglobin G), Serum: 329 mg/dL — ABNORMAL LOW (ref 690–1700)
IgM, Serum: 10 mg/dL — ABNORMAL LOW (ref 52–322)

## 2014-02-07 LAB — KAPPA/LAMBDA LIGHT CHAINS
KAPPA FREE LGHT CHN: 11.9 mg/dL — AB (ref 0.33–1.94)
Kappa:Lambda Ratio: 13.52 — ABNORMAL HIGH (ref 0.26–1.65)
LAMBDA FREE LGHT CHN: 0.88 mg/dL (ref 0.57–2.63)

## 2014-02-18 ENCOUNTER — Other Ambulatory Visit: Payer: Self-pay | Admitting: *Deleted

## 2014-02-18 DIAGNOSIS — C9000 Multiple myeloma not having achieved remission: Secondary | ICD-10-CM

## 2014-02-18 DIAGNOSIS — N08 Glomerular disorders in diseases classified elsewhere: Principal | ICD-10-CM

## 2014-02-18 MED ORDER — POMALIDOMIDE 2 MG PO CAPS: 2.0000 mg | ORAL_CAPSULE | Freq: Every day | ORAL | Status: DC

## 2014-02-18 NOTE — Addendum Note (Signed)
Addended by: Wyonia Hough on: 02/18/2014 04:19 PM   Modules accepted: Orders

## 2014-02-18 NOTE — Telephone Encounter (Signed)
THIS REFILL REQUEST FOR POMALYST WAS GIVEN TO DR.SEHBAI'S NURSE, STACEY CAMP,RN.

## 2014-02-19 NOTE — Telephone Encounter (Signed)
[facility] faxed confirmation of facsimile receipt for Pomalyst prescription referral.  [facility] will verify insurance and make delivery arrangements with patient. 

## 2014-02-20 NOTE — Telephone Encounter (Signed)
RECEIVED A FAX FROM [employer] CONCERNING A CONFIRMATION OF PRESCRIPTION SHIPMENT FOR POMALYST ON 02/19/14.

## 2014-02-24 ENCOUNTER — Other Ambulatory Visit: Payer: Self-pay | Admitting: *Deleted

## 2014-02-24 MED ORDER — HYDROCODONE-ACETAMINOPHEN 5-325 MG PO TABS: ORAL_TABLET | ORAL | Status: DC

## 2014-02-24 NOTE — Telephone Encounter (Signed)
[facility] 

## 2014-03-05 ENCOUNTER — Telehealth: Payer: Self-pay | Admitting: Hematology

## 2014-03-05 ENCOUNTER — Ambulatory Visit (HOSPITAL_BASED_OUTPATIENT_CLINIC_OR_DEPARTMENT_OTHER): Payer: Medicare Other | Admitting: Hematology

## 2014-03-05 ENCOUNTER — Other Ambulatory Visit (HOSPITAL_BASED_OUTPATIENT_CLINIC_OR_DEPARTMENT_OTHER): Payer: Medicare Other

## 2014-03-05 ENCOUNTER — Encounter: Payer: Self-pay | Admitting: Hematology

## 2014-03-05 ENCOUNTER — Ambulatory Visit: Payer: Medicare Other

## 2014-03-05 VITALS — BP 143/75 | HR 73 | Temp 98.1°F | Resp 18 | Ht <= 58 in | Wt 93.0 lb

## 2014-03-05 DIAGNOSIS — D519 Vitamin B12 deficiency anemia, unspecified: Secondary | ICD-10-CM

## 2014-03-05 DIAGNOSIS — E440 Moderate protein-calorie malnutrition: Secondary | ICD-10-CM

## 2014-03-05 DIAGNOSIS — E538 Deficiency of other specified B group vitamins: Secondary | ICD-10-CM

## 2014-03-05 DIAGNOSIS — E612 Magnesium deficiency: Secondary | ICD-10-CM

## 2014-03-05 DIAGNOSIS — Z85038 Personal history of other malignant neoplasm of large intestine: Secondary | ICD-10-CM

## 2014-03-05 DIAGNOSIS — E785 Hyperlipidemia, unspecified: Secondary | ICD-10-CM

## 2014-03-05 DIAGNOSIS — C9000 Multiple myeloma not having achieved remission: Secondary | ICD-10-CM

## 2014-03-05 DIAGNOSIS — I10 Essential (primary) hypertension: Secondary | ICD-10-CM

## 2014-03-05 DIAGNOSIS — E871 Hypo-osmolality and hyponatremia: Secondary | ICD-10-CM

## 2014-03-05 DIAGNOSIS — K922 Gastrointestinal hemorrhage, unspecified: Secondary | ICD-10-CM

## 2014-03-05 DIAGNOSIS — D509 Iron deficiency anemia, unspecified: Secondary | ICD-10-CM

## 2014-03-05 LAB — COMPREHENSIVE METABOLIC PANEL (CC13)
ALT: 7 U/L (ref 0–55)
ANION GAP: 8 meq/L (ref 3–11)
AST: 10 U/L (ref 5–34)
Albumin: 2.9 g/dL — ABNORMAL LOW (ref 3.5–5.0)
Alkaline Phosphatase: 49 U/L (ref 40–150)
BUN: 10.6 mg/dL (ref 7.0–26.0)
CALCIUM: 10 mg/dL (ref 8.4–10.4)
CHLORIDE: 100 meq/L (ref 98–109)
CO2: 32 mEq/L — ABNORMAL HIGH (ref 22–29)
Creatinine: 0.7 mg/dL (ref 0.6–1.1)
Glucose: 163 mg/dl — ABNORMAL HIGH (ref 70–140)
Potassium: 3.7 mEq/L (ref 3.5–5.1)
SODIUM: 139 meq/L (ref 136–145)
TOTAL PROTEIN: 6.9 g/dL (ref 6.4–8.3)
Total Bilirubin: 0.31 mg/dL (ref 0.20–1.20)

## 2014-03-05 LAB — CBC WITH DIFFERENTIAL/PLATELET
BASO%: 1.7 % (ref 0.0–2.0)
Basophils Absolute: 0.1 10*3/uL (ref 0.0–0.1)
EOS%: 1.8 % (ref 0.0–7.0)
Eosinophils Absolute: 0.1 10*3/uL (ref 0.0–0.5)
HCT: 30.5 % — ABNORMAL LOW (ref 34.8–46.6)
HGB: 9.4 g/dL — ABNORMAL LOW (ref 11.6–15.9)
LYMPH#: 1.4 10*3/uL (ref 0.9–3.3)
LYMPH%: 29.6 % (ref 14.0–49.7)
MCH: 29.3 pg (ref 25.1–34.0)
MCHC: 30.9 g/dL — AB (ref 31.5–36.0)
MCV: 94.9 fL (ref 79.5–101.0)
MONO#: 0.7 10*3/uL (ref 0.1–0.9)
MONO%: 14 % (ref 0.0–14.0)
NEUT#: 2.5 10*3/uL (ref 1.5–6.5)
NEUT%: 52.9 % (ref 38.4–76.8)
Platelets: 300 10*3/uL (ref 145–400)
RBC: 3.22 10*6/uL — AB (ref 3.70–5.45)
RDW: 18.8 % — ABNORMAL HIGH (ref 11.2–14.5)
WBC: 4.8 10*3/uL (ref 3.9–10.3)

## 2014-03-05 MED ORDER — CYANOCOBALAMIN 1000 MCG/ML IJ SOLN
1000.0000 ug | Freq: Once | INTRAMUSCULAR | Status: AC
  Administered 2014-03-05: 1000 ug via INTRAMUSCULAR

## 2014-03-05 MED ORDER — DARBEPOETIN ALFA-POLYSORBATE 300 MCG/0.6ML IJ SOLN
300.0000 ug | Freq: Once | INTRAMUSCULAR | Status: AC
  Administered 2014-03-05: 300 ug via SUBCUTANEOUS
  Filled 2014-03-05: qty 0.6

## 2014-03-05 NOTE — Progress Notes (Signed)
[facility] PROGRESS NOTE DATE OF VISIT: 03/05/2014  [name], MD [address] [place] [address]  DIAGNOSIS: Multiple Myeloma  Chief Complaint  Patient presents with  . Follow-up   DIAGNOSIS:  1. Multiple myeloma.  2. Anemia secondary to vitamin B12 deficiency.   PAST THERAPY:  Revlimid (08/2009-08/2012)  Zometa (02/2010 - 02/2012)  Velcade (05/2011 -10/09/2013)  Cytoxan 08/2012 - 10/09/2013)  Decadron (08/2012 - present)   CURRENT THERAPY:  1. Pomalyst 2 mg daily for 1 - 21 days and then off 7 days started on 10/25/2013. She is in middle of cycle 01/25/14 through 02/14/14. Cycle 5 started on 02/22/14 2. Decadron 20 mg weekly (every Wednesday) 2. Vitamin B12 mg IM monthly.  3. Aranesp 300 mcg subcu every 4 weeks for hemoglobin less than or equal to 10.   Interim History: The patient is a pleasant 80 y.o. female who presented for follow up visit. She was last seen by me on 02/05/14. Today, she is accompanied by her daughter [name].  She continues rehab for a right greater trochanter frx (on observation, not felt to require surgical intervention).  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  She restarted cycle #5 of her pomalyst and is mid-cycle.    She denies any  fevers or chills. She denies focal neurological symptoms, headaches or visual changes, abdominal or urinary. Her appetite and weight is stable.    She reported pain in back,neck and shoulders; numbness in fingers and feet, and dry skin. The patient denied fever, chills, night sweats, change in appetite or weight. She denied odynophagia or dysphagia. No chest pain, palpitations, abdominal pain, vomiting, constipation, hematochezia. The patient denied dysuria, nocturia, polyuria, hematuria, myalgia, tingling, psychiatric problems. She has some bruising in left leg superficial from steroids I presume. She is a diabetic. She uses walker for ambulation and does not need  oxygen any more.Main complaint is some arthritis related pain.  MEDICAL HISTORY: Past Medical History  Diagnosis Date  . Hypertension   . Diabetes mellitus   . Anemia   . Osteoporosis   . Dyslipidemia   . Myocardial infarction   . Coronary artery disease   . Shortness of breath   . GERD (gastroesophageal reflux disease)   . Wears glasses   . colon ca dx'd 2003    surg only  . Multiple myeloma dx'd 2009    oral chemo ongoing    INTERIM HISTORY: has Multiple myeloma; Anemia of chronic disease; Magnesium deficiency; Type II or unspecified type diabetes mellitus without mention of complication, uncontrolled; Hypertension; Coronary artery disease with history of MI; Fall; Fracture of multiple ribs; PNA (pneumonia); Thrombocytopenia, unspecified; Hyponatremia; Moderate protein-calorie malnutrition; Hypomagnesemia; Hyperlipidemia; Hypokalemia; GI bleed; Acute GI bleeding; Chronic combined systolic and diastolic congestive heart failure; Abdominal pain, epigastric ventral hernia; Hiatal hernia; History of colon cancer; Greater trochanter fracture; Hip fracture; UTI (urinary tract infection); and Edema on her problem list.    ALLERGIES:  is allergic to gemfibrozil; nifedipine; and questran.  MEDICATIONS: has a current medication list which includes the following prescription(s): acyclovir, magic mouthwash w/lidocaine, bisacodyl, calcium carbonate, camphor-menthol-capsicum, carvedilol, cholecalciferol, cyanocobalamin, cyclobenzaprine, dexamethasone, hydrocodone-acetaminophen, lisinopril, loperamide, magnesium hydroxide, magnesium oxide, metformin, multivitamin with minerals, nystatin, pantoprazole, polyethyl glycol-propyl glycol, pomalidomide, prochlorperazine, and sodium chloride, and the following Facility-Administered Medications: darbepoetin.  SURGICAL HISTORY:  Past Surgical History  Procedure Laterality Date  . Coronary artery bypass graft  08/2001    4 vessel  .  Colon surgery  2003  .  Tonsillectomy    . Appendectomy    . Abdominal hysterectomy    . Back surgery    . Eye surgery      cataracts  . Direct laryngoscopy with radiaesse injection Left 08/13/2012    Procedure: LEFT VOCAL CORD RADIESSE AUGMENTATION LARYNGOSCOPY ;  Surgeon: Jodi Marble, MD;  Location: [facility];  Service: ENT;  Laterality: Left;  . Colonoscopy N/A 06/13/2013    Procedure: COLONOSCOPY;  Surgeon: Jeryl Columbia, MD;  Location: [facility];  Service: Endoscopy;  Laterality: N/A;   Problem List:  1. Multiple myeloma, initially presenting as an IgA kappa monoclonal gammopathy in February 2009. 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 This was felt to be an adverse prognostic determinant. Bone marrow on 08/31/2007 showed 46% plasma cells. A repeat bone marrow on 08/05/2009 showed 73% plasma cells. Treatment with Revlimid was started in April 2011. Zometa was started in October 2011 and 2 years of treatment were concluded on 02/23/2012. We had previously started Aranesp for the patient's anemia in May of 2010. Most recent metastatic bone survey was on 08/05/2009. Other x-rays since then are available. Maximum IgA level was 2080 on 08/11/2009. Subcutaneous Velcade was added to the patient's treatment program on 06/14/2011 because of a rising IgA level 1080 on 06/02/2011. The patient had been receiving Velcade every 2 weeks in combination with Revlimid during the early months of 2014. The patient continues to demonstrate progressive disease as evidenced by rising IgA and serum kappa levels. Revlimid was discontinued in April 2014. The patient had received Revlimid for about 3 years from April 2011 through April 2014. As of 08/29/2012, the patient was receiving Velcade, Cytoxan and Decadron every 2 weeks. The patient has also been receiving Aranesp 300 mcg subcu every 2 weeks for hemoglobin less than or equal to 10.  Her VCD was discontinued due to progression and she started Pomalyst plus  dexamethasone on 10/25/2013. She is on cycle 4 now. 2. Anemia secondary to vitamin B12 deficiency. The patient had been on oral vitamin B12. However, her vitamin B12 level fell to 284 on 07/26/2011 and vitamin B12 shots 1000 mcg given IM every month was resumed on 09/06/2011.  3. Anemia secondary to iron deficiency.   REVIEW OF SYSTEMS:   Constitutional: Denies fevers, chills or abnormal weight loss Eyes: Denies blurriness of vision Ears, nose, mouth, throat, and face: Denies mucositis or sore throat Respiratory: Denies cough, dyspnea or wheezes Cardiovascular: Denies palpitation, chest discomfort or lower extremity swelling Gastrointestinal:  Denies nausea, heartburn;  positive for constipation Skin: Denies abnormal skin rashes Lymphatics: Denies new lymphadenopathy or easy bruising Neurological:Denies numbness, tingling or new weaknesses Behavioral/Psych: Mood is stable, no new changes  All other systems were reviewed with the patient and are negative.  PHYSICAL EXAMINATION: ECOG PERFORMANCE STATUS: 1-2  Blood pressure 143/75, pulse 73, temperature 98.1 F (36.7 C), temperature source Oral, resp. rate 18, height 4' 8"  (1.422 m), weight 93 lb (42.185 kg).  GENERAL:alert, no distress and comfortable; Diminished hearing; Kyphotic SKIN: skin color, texture, turgor are normal, no rashes or significant lesions  EYES: normal, Conjunctiva are pink and non-injected, sclera clear OROPHARYNX:no exudate, no erythema and lips, buccal mucosa, and tongue normal  NECK: supple, thyroid normal size, non-tender, without nodularity LYMPH:  no palpable lymphadenopathy in the cervical, axillary or supraclavicular LUNGS: clear to auscultation and percussion with normal breathing effort HEART: regular rate & rhythm and no murmurs and no lower extremity  edema ABDOMEN:abdomen soft, non-tender and normal bowel sounds Musculoskeletal:no cyanosis of digits and no clubbing, mild bruising in left leg noted NEURO:  alert & oriented x 3 with fluent speech, no focal motor/sensory deficits; cautious gait with a walker.   Lab Results  Component Value Date   WBC 4.8 03/05/2014   HGB 9.4* 03/05/2014   HCT 30.5* 03/05/2014   MCV 94.9 03/05/2014   PLT 300 03/05/2014   NEUTROABS 2.5 03/05/2014      Chemistry      Component Value Date/Time   NA 139 02/05/2014 0918   NA 132* 11/11/2013 0524   K 4.0 02/05/2014 0918   K 3.7 11/11/2013 0524   CL 93* 11/11/2013 0524   CL 101 10/18/2012 1051   CO2 30* 02/05/2014 0918   CO2 27 11/11/2013 0524   BUN 10.5 02/05/2014 0918   BUN 14 11/11/2013 0524   CREATININE 0.7 02/05/2014 0918   CREATININE 0.56 11/11/2013 0524   CREATININE 0.69 08/11/2009 1145      Component Value Date/Time   CALCIUM 9.5 02/05/2014 0918   CALCIUM 8.8 11/11/2013 0524   ALKPHOS 61 02/05/2014 0918   ALKPHOS 40 11/11/2013 0524   AST 8 02/05/2014 0918   AST 11 11/11/2013 0524   ALT <6 02/05/2014 0918   ALT 7 11/11/2013 0524   BILITOT 0.37 02/05/2014 0918   BILITOT 0.3 11/11/2013 0524     Myeloma markers from today drawn and are pending. Listed below we were seeing very good response in light chains and IgA levels.         RADIOGRAPHIC STUDIES: 10/30/2013  METASTATIC BONE SURVEY  COMPARISON: Metastatic survey dated 11/01/2012  FINDINGS: The bones are osteopenic. Focal area of lucency within the frontal region of the cranium. Finding has developed in the interim when compared to prior. Multilevel spondylosis throughout the cervical, thoracic, and lumbar  spine, stable. Stable areas of cement augmentation within the lower thoracic spine and lower lumbar spine. No appreciable destructive lytic lesions within the spine. There has been increase in the number of rib fractures on the left. These fractures have areas of callus, age indeterminate. No appreciable destructive lytic lesions. No appreciable rib fractures on the left. Osteoarthritic changes are again appreciated within the hips, knees, right ankle.  There is no evidence of aggressive appearing lytic lesions within the appendicular skeleton. Diffuse advanced atherosclerotic calcifications appreciated. IMPRESSION:  Interval development of a lytic focus in the frontal region of the calvarium. No further regions destructive appearing lytic lesions. Stable diffuse spondylosis throughout the spine. Stable areas of cement augmentation within the thoracic and lumbar spine Increase in the rib fractures on the left age indeterminate. Advanced atherosclerotic changes Multiple areas of advanced osteoarthritic changes.  11/09/2013 RIGHT HIP - COMPLETE 2+ VIEW  COMPARISON: CT from the previous day  FINDINGS: Minimally distracted avulsion fracture of the greater trochanter. No definite extension across the femoral neck or head. No dislocation. No other fracture or acute bone abnormality. Changes of kyphoplasty/ vertebroplasty in the lower lumbar spine. Extensive bilateral iliofemoral arterial calcifications. Mild diffuse osteopenia.  IMPRESSION: 1. Little definite change in appearance of right greater trochanter avulsion fracture.  ASSESSMENT: MATTESON BLUE 80 y.o. female with a history of Multiple Myeloma.   PLAN:  1. Multiple myeloma, progressive. --Her IgA level was 1180 on 02/05/14 up 716 on 01/08/14.  We are waiting for one from today.The kappa:lambda ratio on 02/05/14 was stable at 13.5 down from 63 four months ago.  We will continue pomalyst  treatment given she received at least 2 prior therapies (including lenalidomide and bortezomib) and had demonstrated disease progression on or within 60 days of completion of the last therapy. We stopped Velcade, cytoxan and changed to pomalyst 2 mg daily for 21 days, then off for 7 days in combination with her dexamethasone 20 mg weekly. She is not taking aspirin because of the GI bleed she had in Jan 2015 as per daughter. The benefit of treatment including controlling her multiple myeloma and risks were discussed  including but not limited to bone marrow suppression resulting in life threatening infections, anemia and fatigue, diarrhea, second malignancies and neuropathy.  She agreed to proceed with treatment.  If her MM progresses despite this treatment, we will consider a carfilzomib-based treatment.   --We will follow SPEP plus IFE, UPEP plus IFE and Kappa Lambda light chains while on therapy monthly.   We obtained a skeletal survey on 10/30/2013 which demonstrated an interval development of a lytic focus in the frontal region of the calvarium.    2. S/p R greater trochanteric fracture. --She reports that her pain is stable.  Continued physical therapy as tolerated.   3. Hypoalbuminemia, improving. --Her albumin was 2.8 last month and waiting for one from today.     4. Anemia. --Secondary to number 1 plus or minus pomalyst.   She is asymptomatic. She will get Aranesp and B12 shot in office today. Hemoglobin 9.4 grams up from 9.2 grams.  5. Follow up. RTC in 4 weeks for evaluation for another treatment with CBC, CMET and myeloma markers  All questions were answered. The patient knows to call the clinic with any problems, questions or concerns. We can certainly see the patient much sooner if necessary.  I spent 20 minutes counseling the patient face to face. The total time spent in the appointment was 25 minutes.     Bernadene Bell, MD Medical Hematologist/Oncologist [facility] Pager: [phone] Office No: [phone]

## 2014-03-05 NOTE — Patient Instructions (Signed)
Cyanocobalamin, Vitamin B12 injection What is this medicine? CYANOCOBALAMIN (sye an oh koe BAL a min) is a man made form of vitamin B12. Vitamin B12 is used in the growth of healthy blood cells, nerve cells, and proteins in the body. It also helps with the metabolism of fats and carbohydrates. This medicine is used to treat people who can not absorb vitamin B12. This medicine may be used for other purposes; ask your health care provider or pharmacist if you have questions. COMMON BRAND NAME(S): Cyomin, LA-12, Nutri-Twelve, Primabalt What should I tell my health care provider before I take this medicine? They need to know if you have any of these conditions: -kidney disease -Leber's disease -megaloblastic anemia -an unusual or allergic reaction to cyanocobalamin, cobalt, other medicines, foods, dyes, or preservatives -pregnant or trying to get pregnant -breast-feeding How should I use this medicine? This medicine is injected into a muscle or deeply under the skin. It is usually given by a health care professional in a clinic or doctor's office. However, your doctor may teach you how to inject yourself. Follow all instructions. Talk to your pediatrician regarding the use of this medicine in children. Special care may be needed. Overdosage: If you think you have taken too much of this medicine contact a poison control center or emergency room at once. NOTE: This medicine is only for you. Do not share this medicine with others. What if I miss a dose? If you are given your dose at a clinic or doctor's office, call to reschedule your appointment. If you give your own injections and you miss a dose, take it as soon as you can. If it is almost time for your next dose, take only that dose. Do not take double or extra doses. What may interact with this medicine? -colchicine -heavy alcohol intake This list may not describe all possible interactions. Give your health care provider a list of all the  medicines, herbs, non-prescription drugs, or dietary supplements you use. Also tell them if you smoke, drink alcohol, or use illegal drugs. Some items may interact with your medicine. What should I watch for while using this medicine? Visit your doctor or health care professional regularly. You may need blood work done while you are taking this medicine. You may need to follow a special diet. Talk to your doctor. Limit your alcohol intake and avoid smoking to get the best benefit. What side effects may I notice from receiving this medicine? Side effects that you should report to your doctor or health care professional as soon as possible: -allergic reactions like skin rash, itching or hives, swelling of the face, lips, or tongue -blue tint to skin -chest tightness, pain -difficulty breathing, wheezing -dizziness -red, swollen painful area on the leg Side effects that usually do not require medical attention (report to your doctor or health care professional if they continue or are bothersome): -diarrhea -headache This list may not describe all possible side effects. Call your doctor for medical advice about side effects. You may report side effects to FDA at 1-800-FDA-1088. Where should I keep my medicine? Keep out of the reach of children. Store at room temperature between 15 and 30 degrees C (59 and 85 degrees F). Protect from light. Throw away any unused medicine after the expiration date. NOTE: This sheet is a summary. It may not cover all possible information. If you have questions about this medicine, talk to your doctor, pharmacist, or health care provider.  2015, Elsevier/Gold Standard. (2007-08-13 22:10:20) Darbepoetin   Alfa injection What is this medicine? DARBEPOETIN ALFA (dar be POE e tin AL fa) helps your body make more red blood cells. It is used to treat anemia caused by chronic kidney failure and chemotherapy. This medicine may be used for other purposes; ask your health care  provider or pharmacist if you have questions. COMMON BRAND NAME(S): Aranesp What should I tell my health care provider before I take this medicine? They need to know if you have any of these conditions: -blood clotting disorders or history of blood clots -cancer patient not on chemotherapy -cystic fibrosis -heart disease, such as angina, heart failure, or a history of a heart attack -hemoglobin level of 12 g/dL or greater -high blood pressure -low levels of folate, iron, or vitamin B12 -seizures -an unusual or allergic reaction to darbepoetin, erythropoietin, albumin, hamster proteins, latex, other medicines, foods, dyes, or preservatives -pregnant or trying to get pregnant -breast-feeding How should I use this medicine? This medicine is for injection into a vein or under the skin. It is usually given by a health care professional in a hospital or clinic setting. If you get this medicine at home, you will be taught how to prepare and give this medicine. Do not shake the solution before you withdraw a dose. Use exactly as directed. Take your medicine at regular intervals. Do not take your medicine more often than directed. It is important that you put your used needles and syringes in a special sharps container. Do not put them in a trash can. If you do not have a sharps container, call your pharmacist or healthcare provider to get one. Talk to your pediatrician regarding the use of this medicine in children. While this medicine may be used in children as young as 1 year for selected conditions, precautions do apply. Overdosage: If you think you have taken too much of this medicine contact a poison control center or emergency room at once. NOTE: This medicine is only for you. Do not share this medicine with others. What if I miss a dose? If you miss a dose, take it as soon as you can. If it is almost time for your next dose, take only that dose. Do not take double or extra doses. What may  interact with this medicine? Do not take this medicine with any of the following medications: -epoetin alfa This list may not describe all possible interactions. Give your health care provider a list of all the medicines, herbs, non-prescription drugs, or dietary supplements you use. Also tell them if you smoke, drink alcohol, or use illegal drugs. Some items may interact with your medicine. What should I watch for while using this medicine? Visit your prescriber or health care professional for regular checks on your progress and for the needed blood tests and blood pressure measurements. It is especially important for the doctor to make sure your hemoglobin level is in the desired range, to limit the risk of potential side effects and to give you the best benefit. Keep all appointments for any recommended tests. Check your blood pressure as directed. Ask your doctor what your blood pressure should be and when you should contact him or her. As your body makes more red blood cells, you may need to take iron, folic acid, or vitamin B supplements. Ask your doctor or health care provider which products are right for you. If you have kidney disease continue dietary restrictions, even though this medication can make you feel better. Talk with your doctor or health   care professional about the foods you eat and the vitamins that you take. What side effects may I notice from receiving this medicine? Side effects that you should report to your doctor or health care professional as soon as possible: -allergic reactions like skin rash, itching or hives, swelling of the face, lips, or tongue -breathing problems -changes in vision -chest pain -confusion, trouble speaking or understanding -feeling faint or lightheaded, falls -high blood pressure -muscle aches or pains -pain, swelling, warmth in the leg -rapid weight gain -severe headaches -sudden numbness or weakness of the face, arm or leg -trouble walking,  dizziness, loss of balance or coordination -seizures (convulsions) -swelling of the ankles, feet, hands -unusually weak or tired Side effects that usually do not require medical attention (report to your doctor or health care professional if they continue or are bothersome): -diarrhea -fever, chills (flu-like symptoms) -headaches -nausea, vomiting -redness, stinging, or swelling at site where injected This list may not describe all possible side effects. Call your doctor for medical advice about side effects. You may report side effects to FDA at 1-800-FDA-1088. Where should I keep my medicine? Keep out of the reach of children. Store in a refrigerator between 2 and 8 degrees C (36 and 46 degrees F). Do not freeze. Do not shake. Throw away any unused portion if using a single-dose vial. Throw away any unused medicine after the expiration date. NOTE: This sheet is a summary. It may not cover all possible information. If you have questions about this medicine, talk to your doctor, pharmacist, or health care provider.  2015, Elsevier/Gold Standard. (2008-04-15 10:23:57)  

## 2014-03-05 NOTE — Telephone Encounter (Signed)
Pt confirmed labs/ov per 10/21 POF, gave pt AVS.... KJ °

## 2014-03-07 LAB — SPEP & IFE WITH QIG
ALBUMIN ELP: 45.2 % — AB (ref 55.8–66.1)
ALPHA-2-GLOBULIN: 12.6 % — AB (ref 7.1–11.8)
Alpha-1-Globulin: 5.6 % — ABNORMAL HIGH (ref 2.9–4.9)
Beta 2: 25.4 % — ABNORMAL HIGH (ref 3.2–6.5)
Beta Globulin: 5.7 % (ref 4.7–7.2)
GAMMA GLOBULIN: 5.5 % — AB (ref 11.1–18.8)
IgA: 1650 mg/dL — ABNORMAL HIGH (ref 69–380)
IgG (Immunoglobin G), Serum: 311 mg/dL — ABNORMAL LOW (ref 690–1700)
IgM, Serum: 8 mg/dL — ABNORMAL LOW (ref 52–322)
M-Spike, %: 1.11 g/dL
Total Protein, Serum Electrophoresis: 6.7 g/dL (ref 6.0–8.3)

## 2014-03-07 LAB — KAPPA/LAMBDA LIGHT CHAINS
Kappa free light chain: 11.4 mg/dL — ABNORMAL HIGH (ref 0.33–1.94)
Kappa:Lambda Ratio: 11.75 — ABNORMAL HIGH (ref 0.26–1.65)
Lambda Free Lght Chn: 0.97 mg/dL (ref 0.57–2.63)

## 2014-03-14 ENCOUNTER — Other Ambulatory Visit: Payer: Self-pay | Admitting: *Deleted

## 2014-03-14 DIAGNOSIS — N08 Glomerular disorders in diseases classified elsewhere: Principal | ICD-10-CM

## 2014-03-14 DIAGNOSIS — C9000 Multiple myeloma not having achieved remission: Secondary | ICD-10-CM

## 2014-03-14 MED ORDER — POMALIDOMIDE 2 MG PO CAPS: 2.0000 mg | ORAL_CAPSULE | Freq: Every day | ORAL | Status: DC

## 2014-03-14 NOTE — Addendum Note (Signed)
Addended by: Wyonia Hough on: 03/14/2014 03:10 PM   Modules accepted: Orders

## 2014-03-14 NOTE — Telephone Encounter (Signed)
THIS REFILL REQUEST FOR POMALYST WAS GIVEN TO DR.SEHBAI'S NURSE, KATHI BUYCK,RN.

## 2014-03-17 NOTE — Telephone Encounter (Signed)
RECEIVED A FAX BIOLOGICS CONCERNING A CONFIRMATION OF FACSIMILE RECEIPT FOR PT. REFERRAL.

## 2014-03-20 ENCOUNTER — Encounter: Payer: Self-pay | Admitting: Internal Medicine

## 2014-03-20 ENCOUNTER — Non-Acute Institutional Stay (SKILLED_NURSING_FACILITY): Payer: Medicare Other | Admitting: Internal Medicine

## 2014-03-20 DIAGNOSIS — E119 Type 2 diabetes mellitus without complications: Secondary | ICD-10-CM

## 2014-03-20 DIAGNOSIS — C9000 Multiple myeloma not having achieved remission: Secondary | ICD-10-CM

## 2014-03-20 DIAGNOSIS — I10 Essential (primary) hypertension: Secondary | ICD-10-CM

## 2014-03-20 DIAGNOSIS — S72001D Fracture of unspecified part of neck of right femur, subsequent encounter for closed fracture with routine healing: Secondary | ICD-10-CM

## 2014-03-20 DIAGNOSIS — I5042 Chronic combined systolic (congestive) and diastolic (congestive) heart failure: Secondary | ICD-10-CM

## 2014-03-20 DIAGNOSIS — E612 Magnesium deficiency: Secondary | ICD-10-CM

## 2014-03-20 NOTE — Progress Notes (Signed)
Patient ID: [facility], female   DOB: 23-Mar-1926, 80 y.o.   MRN: [id number]  this is a routine visit.  Level of care skilled.  [place].  Chief complaint medical management of hip fracture multiple bilateral low lung diabetes type 2 hypertension hyponatremia CHF.  History of present illness.  Patient is a pleasant 80 year old female with the above diagnosis-she was originally admitted for therapy after a fall with hip fracture is was treated conservatively peers to be doing well she is ambulating with a walker with restorative therapy.  This is complicated with a history of multiple myeloma this is followed by gynecology in fact she saw them last month.  She has a listed history of hypertension this appears to be stable recent blood pressures 136/55-112/62-140/67.  She is a type II diabetic on Glucophage-blood sugars range from 96-1 35 this appears stable today she has no complaints appears to be feeling well.  Family medical social history as been reviewed per previous progress note on 01/01/2014.  Medications have been reviewed per MAR.  Review of systems.  In general no complaints of fever chills.  skin does not complain of rashes or itching. Eyes does not complain of visual changes.Ears nose mouth and throatdoes not complain of any sore throat or dysphagia.  Respiratory no complaints of cough or shortness of breath.  Heart is no chest pain or significant lower extremity edema.  GI does not complain of abdominal discomfort nausea vomiting diarrhea or constipation.  Muscle skeletal does not complain of joint pain.  Neurologic no headache dizziness or syncopal type episode complaints.  Psych she does not exhibit any overt anxiety or depression appears to be pleasant upbeat.  Physical exam.  Temperature is 97.7 pulse 65 respirations 20 blood pressure 136/55.  In general this is a pleasant frail elderly female in no distress.  Her skin is warm and  dry.  Eyes pupils appear reactive to light sclerae and conjunctivae are clear visual acuity appears intact.  Oropharynx is clear mucous membranes moist.  Chest is clear to auscultation she is kyphotic with somewhat shallow air entry and there is no labored breathing.  Heart is regular rate and rhythm without murmur gallop rub or lower extremity edema  Abdomen is soft nontender with positive bowel sounds she does have a chronic abdominal hernia this is nontender nonerythematous.  Muscle skeletal is significantly K phonic she is able to stand without assistance and ambulate with a walker.  Neurologic is grossly intact no lateralizing findings speech is clear.  Psych she appears alert and oriented pleasant and appropriate.  Labs.  02/05/2014.  WBC 9.3 hemoglobin 10.1 platelet 280.  01/22/2014.  WBC 4.1 hemoglobin 8.7 platelets 170.  01/17/2014.  Sodium 139 potassium 3.7 BUN 10 creatinine 0.6.  BNP-1360.  Assessment and plan.  Number history of hip fracture-she appears to be doing well with this ambulating with a walker does not complain of pain she is receiving Norco as needed.  Multiple myeloma this is followed by oncology-at this point appears to be fairly stable clinically.  History of hyponatremia this appears resolved she is on sodium chloride-Will update metabolic panel.  Anemia of chronic disease this actually appears to be improved she is on B12 will update CBC.  History of chronic combined systolic and diastolic CHF-she is on Coreg and lisinopril at this point appears to be stable.  History of type 2 diabetes continues on Glucophage CBGs as noted above are stable.  Question  hypertension blood pressure readings  appear to be stable again she is on lisinopril as well as Coreg with her history of CHF  Of note Will update a B12 magnesium as well as CMP and CBC-she does have a history of magnesium deficiency is on magnesium  supplementation.  [id number]   .                                        Progress Notes                             Other Encounter Related Information                                                                                                               No data filed

## 2014-03-21 NOTE — Telephone Encounter (Signed)
RECEIVED A FAX FROM [facility] CONCERNING A CONFIRMATION OF PRESCRIPTION SHIPMENT FOR POMALYST ON 03/20/14.

## 2014-03-25 ENCOUNTER — Other Ambulatory Visit: Payer: Self-pay | Admitting: *Deleted

## 2014-03-25 MED ORDER — HYDROCODONE-ACETAMINOPHEN 5-325 MG PO TABS: ORAL_TABLET | ORAL | Status: AC

## 2014-03-25 NOTE — Telephone Encounter (Signed)
[facility] 

## 2014-04-02 ENCOUNTER — Telehealth: Payer: Self-pay | Admitting: Hematology

## 2014-04-02 ENCOUNTER — Ambulatory Visit (HOSPITAL_BASED_OUTPATIENT_CLINIC_OR_DEPARTMENT_OTHER): Payer: Medicare Other | Admitting: Hematology

## 2014-04-02 ENCOUNTER — Encounter: Payer: Self-pay | Admitting: Hematology

## 2014-04-02 ENCOUNTER — Ambulatory Visit (HOSPITAL_BASED_OUTPATIENT_CLINIC_OR_DEPARTMENT_OTHER): Payer: Medicare Other

## 2014-04-02 ENCOUNTER — Other Ambulatory Visit (HOSPITAL_BASED_OUTPATIENT_CLINIC_OR_DEPARTMENT_OTHER): Payer: Medicare Other

## 2014-04-02 VITALS — BP 130/54 | HR 65 | Temp 98.2°F | Resp 18 | Ht <= 58 in | Wt 92.0 lb

## 2014-04-02 DIAGNOSIS — D519 Vitamin B12 deficiency anemia, unspecified: Secondary | ICD-10-CM

## 2014-04-02 DIAGNOSIS — D630 Anemia in neoplastic disease: Secondary | ICD-10-CM

## 2014-04-02 DIAGNOSIS — C9000 Multiple myeloma not having achieved remission: Secondary | ICD-10-CM

## 2014-04-02 DIAGNOSIS — E538 Deficiency of other specified B group vitamins: Secondary | ICD-10-CM

## 2014-04-02 DIAGNOSIS — I10 Essential (primary) hypertension: Secondary | ICD-10-CM

## 2014-04-02 DIAGNOSIS — R770 Abnormality of albumin: Secondary | ICD-10-CM

## 2014-04-02 DIAGNOSIS — D509 Iron deficiency anemia, unspecified: Secondary | ICD-10-CM

## 2014-04-02 DIAGNOSIS — E612 Magnesium deficiency: Secondary | ICD-10-CM

## 2014-04-02 DIAGNOSIS — E785 Hyperlipidemia, unspecified: Secondary | ICD-10-CM

## 2014-04-02 DIAGNOSIS — C9002 Multiple myeloma in relapse: Secondary | ICD-10-CM

## 2014-04-02 LAB — CBC WITH DIFFERENTIAL/PLATELET
BASO%: 3.7 % — AB (ref 0.0–2.0)
BASOS ABS: 0.2 10*3/uL — AB (ref 0.0–0.1)
EOS%: 1.9 % (ref 0.0–7.0)
Eosinophils Absolute: 0.1 10*3/uL (ref 0.0–0.5)
HCT: 28.4 % — ABNORMAL LOW (ref 34.8–46.6)
HGB: 8.8 g/dL — ABNORMAL LOW (ref 11.6–15.9)
LYMPH#: 1.1 10*3/uL (ref 0.9–3.3)
LYMPH%: 23.5 % (ref 14.0–49.7)
MCH: 29.5 pg (ref 25.1–34.0)
MCHC: 31.1 g/dL — ABNORMAL LOW (ref 31.5–36.0)
MCV: 95 fL (ref 79.5–101.0)
MONO#: 0.6 10*3/uL (ref 0.1–0.9)
MONO%: 13.6 % (ref 0.0–14.0)
NEUT#: 2.6 10*3/uL (ref 1.5–6.5)
NEUT%: 57.3 % (ref 38.4–76.8)
Platelets: 246 10*3/uL (ref 145–400)
RBC: 2.99 10*6/uL — ABNORMAL LOW (ref 3.70–5.45)
RDW: 18.2 % — AB (ref 11.2–14.5)
WBC: 4.6 10*3/uL (ref 3.9–10.3)

## 2014-04-02 LAB — COMPREHENSIVE METABOLIC PANEL (CC13)
ALBUMIN: 2.8 g/dL — AB (ref 3.5–5.0)
AST: 11 U/L (ref 5–34)
Alkaline Phosphatase: 47 U/L (ref 40–150)
Anion Gap: 8 mEq/L (ref 3–11)
BUN: 12.7 mg/dL (ref 7.0–26.0)
CALCIUM: 9.9 mg/dL (ref 8.4–10.4)
CHLORIDE: 99 meq/L (ref 98–109)
CO2: 31 meq/L — AB (ref 22–29)
Creatinine: 0.7 mg/dL (ref 0.6–1.1)
Glucose: 128 mg/dl (ref 70–140)
POTASSIUM: 3.8 meq/L (ref 3.5–5.1)
Sodium: 139 mEq/L (ref 136–145)
Total Bilirubin: 0.29 mg/dL (ref 0.20–1.20)
Total Protein: 7.3 g/dL (ref 6.4–8.3)

## 2014-04-02 LAB — LACTATE DEHYDROGENASE (CC13): LDH: 132 U/L (ref 125–245)

## 2014-04-02 MED ORDER — CYANOCOBALAMIN 1000 MCG/ML IJ SOLN
1000.0000 ug | Freq: Once | INTRAMUSCULAR | Status: AC
  Administered 2014-04-02: 1000 ug via INTRAMUSCULAR

## 2014-04-02 MED ORDER — DARBEPOETIN ALFA-POLYSORBATE 500 MCG/ML IJ SOLN: 300.0000 ug | Freq: Once | INTRAMUSCULAR | Status: DC

## 2014-04-02 MED ORDER — DARBEPOETIN ALFA 300 MCG/0.6ML IJ SOSY
300.0000 ug | PREFILLED_SYRINGE | Freq: Once | INTRAMUSCULAR | Status: AC
  Administered 2014-04-02: 300 ug via SUBCUTANEOUS
  Filled 2014-04-02: qty 0.6

## 2014-04-02 NOTE — Progress Notes (Signed)
[facility] PROGRESS NOTE DATE OF VISIT: 04/02/2014  Christina Frees, MD [address] [place] [address]  DIAGNOSIS: Multiple Myeloma  Chief Complaint  Patient presents with  . Follow-up   DIAGNOSIS:  1. Multiple myeloma.  2. Anemia secondary to vitamin B12 deficiency.   PAST THERAPY:  Revlimid (08/2009-08/2012)  Zometa (02/2010 - 02/2012)  Velcade (05/2011 -10/09/2013)  Cytoxan 08/2012 - 10/09/2013)  Decadron (08/2012 - present)   CURRENT THERAPY:  1. Pomalyst 2 mg daily for 1 - 21 days and then off 7 days started on 10/25/2013. She is in middle of cycle 6 03/22/14 through 04/11/14.  2. Decadron 20 mg weekly (every Wednesday) 3. Vitamin B12 mg IM monthly.  4. Aranesp 300 mcg subcu every 4 weeks for hemoglobin less than or equal to 10.  5. If her IgA and M-Protein continues to go up as of labs from today, I discussed with her about changing therapy to Carfilzomib next month.  Interim History: The patient is a pleasant 80 y.o. female who presented for follow up visit. She was last seen by me on 02/05/14. Today, she is accompanied by her daughter [facility].  She continues rehab for a right greater trochanter frx (on observation, not felt to require surgical intervention).  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  She restarted cycle #5 of her pomalyst and is mid-cycle.    She denies any  fevers or chills. She denies focal neurological symptoms, headaches or visual changes, abdominal or urinary. Her appetite and weight is stable.    She reported pain in back,neck and shoulders; numbness in fingers and feet, and dry skin. The patient denied fever, chills, night sweats, change in appetite or weight. She denied odynophagia or dysphagia. No chest pain, palpitations, abdominal pain, vomiting, constipation, hematochezia. The patient denied dysuria, nocturia, polyuria, hematuria, myalgia, tingling, psychiatric problems. She has some bruising in  left leg superficial from steroids I presume. She is a diabetic. She uses walker for ambulation and does not need oxygen any more.Main complaint is some arthritis related pain.  MEDICAL HISTORY: Past Medical History  Diagnosis Date  . Hypertension   . Diabetes mellitus   . Anemia   . Osteoporosis   . Dyslipidemia   . Myocardial infarction   . Coronary artery disease   . Shortness of breath   . GERD (gastroesophageal reflux disease)   . Wears glasses   . colon ca dx'd 2003    surg only  . Multiple myeloma dx'd 2009    oral chemo ongoing    INTERIM HISTORY: has Multiple myeloma; Anemia of chronic disease; Magnesium deficiency; Diabetes type 2, controlled; Hypertension; Coronary artery disease with history of MI; Fall; Fracture of multiple ribs; PNA (pneumonia); Thrombocytopenia, unspecified; Hyponatremia; Moderate protein-calorie malnutrition; Hypomagnesemia; Hyperlipidemia; Hypokalemia; GI bleed; Acute GI bleeding; Chronic combined systolic and diastolic congestive heart failure; Abdominal pain, epigastric ventral hernia; Hiatal hernia; History of colon cancer; Greater trochanter fracture; Hip fracture; UTI (urinary tract infection); and Edema on her problem list.    ALLERGIES:  is allergic to gemfibrozil; nifedipine; and questran.  MEDICATIONS: has a current medication list which includes the following prescription(s): acyclovir, magic mouthwash w/lidocaine, bisacodyl, calcium carbonate, camphor-menthol-capsicum, carvedilol, cholecalciferol, cyanocobalamin, cyclobenzaprine, dexamethasone, hydrocodone-acetaminophen, lisinopril, loperamide, magnesium hydroxide, magnesium oxide, metformin, multivitamin with minerals, nystatin, pantoprazole, polyethyl glycol-propyl glycol, pomalidomide, prochlorperazine, and sodium chloride, and the following Facility-Administered Medications: darbepoetin.  SURGICAL HISTORY:  Past Surgical History  Procedure Laterality Date  .  Coronary artery bypass graft   08/2001    4 vessel  . Colon surgery  2003  . Tonsillectomy    . Appendectomy    . Abdominal hysterectomy    . Back surgery    . Eye surgery      cataracts  . Direct laryngoscopy with radiaesse injection Left 08/13/2012    Procedure: LEFT VOCAL CORD RADIESSE AUGMENTATION LARYNGOSCOPY ;  Surgeon: Jodi Marble, MD;  Location: [facility];  Service: ENT;  Laterality: Left;  . Colonoscopy N/A 06/13/2013    Procedure: COLONOSCOPY;  Surgeon: Jeryl Columbia, MD;  Location: [facility];  Service: Endoscopy;  Laterality: N/A;   Problem List:  1. Multiple myeloma, initially presenting as an IgA kappa monoclonal gammopathy in February 2009. 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 This was felt to be an adverse prognostic determinant. Bone marrow on 08/31/2007 showed 46% plasma cells. A repeat bone marrow on 08/05/2009 showed 73% plasma cells. Treatment with Revlimid was started in April 2011. Zometa was started in October 2011 and 2 years of treatment were concluded on 02/23/2012. We had previously started Aranesp for the patient's anemia in May of 2010. Most recent metastatic bone survey was on 08/05/2009. Other x-rays since then are available. Maximum IgA level was 2080 on 08/11/2009. Subcutaneous Velcade was added to the patient's treatment program on 06/14/2011 because of a rising IgA level 1080 on 06/02/2011. The patient had been receiving Velcade every 2 weeks in combination with Revlimid during the early months of 2014. The patient continues to demonstrate progressive disease as evidenced by rising IgA and serum kappa levels. Revlimid was discontinued in April 2014. The patient had received Revlimid for about 3 years from April 2011 through April 2014. As of 08/29/2012, the patient was receiving Velcade, Cytoxan and Decadron every 2 weeks. The patient has also been receiving Aranesp 300 mcg subcu every 2 weeks for hemoglobin less than or equal to 10.  Her VCD was discontinued due to  progression and she started Pomalyst plus dexamethasone on 10/25/2013. She is on cycle 4 now. 2. Anemia secondary to vitamin B12 deficiency. The patient had been on oral vitamin B12. However, her vitamin B12 level fell to 284 on 07/26/2011 and vitamin B12 shots 1000 mcg given IM every month was resumed on 09/06/2011.  3. Anemia secondary to iron deficiency.   REVIEW OF SYSTEMS:   Constitutional: Denies fevers, chills or abnormal weight loss Eyes: Denies blurriness of vision Ears, nose, mouth, throat, and face: Denies mucositis or sore throat Respiratory: Denies cough, dyspnea or wheezes Cardiovascular: Denies palpitation, chest discomfort or lower extremity swelling Gastrointestinal:  Denies nausea, heartburn;  positive for constipation Skin: Denies abnormal skin rashes Lymphatics: Denies new lymphadenopathy or easy bruising Neurological:Denies numbness, tingling or new weaknesses Behavioral/Psych: Mood is stable, no new changes  All other systems were reviewed with the patient and are negative.  PHYSICAL EXAMINATION: ECOG PERFORMANCE STATUS: 1-2  Blood pressure 130/54, pulse 65, temperature 98.2 F (36.8 C), temperature source Oral, resp. rate 18, height 4' 8"  (1.422 m), weight 92 lb (41.731 kg), SpO2 90 %.  GENERAL:alert, no distress and comfortable; Diminished hearing; Kyphotic SKIN: skin color, texture, turgor are normal, no rashes or significant lesions  EYES: normal, Conjunctiva are pink and non-injected, sclera clear OROPHARYNX:no exudate, no erythema and lips, buccal mucosa, and tongue normal  NECK: supple, thyroid normal size, non-tender, without nodularity LYMPH:  no palpable lymphadenopathy in the cervical, axillary or supraclavicular LUNGS: clear to auscultation and percussion  with normal breathing effort HEART: regular rate & rhythm and no murmurs and no lower extremity edema ABDOMEN:abdomen soft, non-tender and normal bowel sounds Musculoskeletal:no cyanosis of digits and  no clubbing, mild bruising in left leg noted NEURO: alert & oriented x 3 with fluent speech, no focal motor/sensory deficits; cautious gait with a walker.         Myeloma markers from today drawn and are pending. Listed below we were seeing very good response in light chains and IgA levels.         RADIOGRAPHIC STUDIES: 10/30/2013  METASTATIC BONE SURVEY  COMPARISON: Metastatic survey dated 11/01/2012  FINDINGS: The bones are osteopenic. Focal area of lucency within the frontal region of the cranium. Finding has developed in the interim when compared to prior. Multilevel spondylosis throughout the cervical, thoracic, and lumbar  spine, stable. Stable areas of cement augmentation within the lower thoracic spine and lower lumbar spine. No appreciable destructive lytic lesions within the spine. There has been increase in the number of rib fractures on the left. These fractures have areas of callus, age indeterminate. No appreciable destructive lytic lesions. No appreciable rib fractures on the left. Osteoarthritic changes are again appreciated within the hips, knees, right ankle. There is no evidence of aggressive appearing lytic lesions within the appendicular skeleton. Diffuse advanced atherosclerotic calcifications appreciated. IMPRESSION:  Interval development of a lytic focus in the frontal region of the calvarium. No further regions destructive appearing lytic lesions. Stable diffuse spondylosis throughout the spine. Stable areas of cement augmentation within the thoracic and lumbar spine Increase in the rib fractures on the left age indeterminate. Advanced atherosclerotic changes Multiple areas of advanced osteoarthritic changes.  11/09/2013 RIGHT HIP - COMPLETE 2+ VIEW  COMPARISON: CT from the previous day  FINDINGS: Minimally distracted avulsion fracture of the greater trochanter. No definite extension across the femoral neck or head. No dislocation. No other fracture or acute bone  abnormality. Changes of kyphoplasty/ vertebroplasty in the lower lumbar spine. Extensive bilateral iliofemoral arterial calcifications. Mild diffuse osteopenia.  IMPRESSION: 1. Little definite change in appearance of right greater trochanter avulsion fracture.  ASSESSMENT: NELSY MADONNA 80 y.o. female with a history of Multiple Myeloma.   PLAN:  1. Multiple myeloma, progressive. --Her IgA level was 1650 on 10/21 up from 1180 on 02/05/14 up 716 on 01/08/14.  We are waiting for one from today.The kappa:lambda ratio on 02/05/14 was stable/trending down 11.75 down from 13.5 down from 63 four months ago.  We will continue pomalyst treatment given she received at least 2 prior therapies (including lenalidomide and bortezomib) and had demonstrated disease progression on or within 60 days of completion of the last therapy. We stopped Velcade, cytoxan and changed to pomalyst 2 mg daily for 21 days, then off for 7 days in combination with her dexamethasone 20 mg weekly. She is not taking aspirin because of the GI bleed she had in Jan 2015 as per daughter. The benefit of treatment including controlling her multiple myeloma and risks were discussed including but not limited to bone marrow suppression resulting in life threatening infections, anemia and fatigue, diarrhea, second malignancies and neuropathy.  She agreed to proceed with treatment.  If her MM progresses despite this treatment, we will consider a carfilzomib-based treatment.   --We will follow SPEP, QIG and Kappa Lambda light chains while on therapy monthly.   We obtained a skeletal survey on 10/30/2013 which demonstrated an interval development of a lytic focus in the frontal region of the calvarium.  2. S/p R greater trochanteric fracture. --She reports that her pain is stable.  Continued physical therapy as tolerated.   3. Hypoalbuminemia, improving. --Her albumin was 2.8 last month and waiting for one from today.     4. Anemia. --Secondary to  number 1 plus or minus pomalyst.   She is asymptomatic. She will get Aranesp and B12 shot in [facility] today. Hemoglobin 8.8 grams down from 9.4 grams.  5. Follow up. RTC in 4 weeks for evaluation for another treatment with CBC, CMET and myeloma markers with Dr Alvy Bimler and possible change in therapy if myeloma markers are still going up.  All questions were answered. The patient knows to call the clinic with any problems, questions or concerns. We can certainly see the patient much sooner if necessary.  I spent 20 minutes counseling the patient face to face. The total time spent in the appointment was 25 minutes.     Bernadene Bell, MD Medical Hematologist/Oncologist [place] Pager: [phone] [facility] No: [phone]

## 2014-04-02 NOTE — Telephone Encounter (Signed)
Gave avs & cal for Dec. °

## 2014-04-04 LAB — SPEP & IFE WITH QIG
ALBUMIN ELP: 40 % — AB (ref 55.8–66.1)
ALPHA-1-GLOBULIN: 5.1 % — AB (ref 2.9–4.9)
ALPHA-2-GLOBULIN: 11.8 % (ref 7.1–11.8)
BETA GLOBULIN: 5.4 % (ref 4.7–7.2)
Beta 2: 31.4 % — ABNORMAL HIGH (ref 3.2–6.5)
Gamma Globulin: 6.3 % — ABNORMAL LOW (ref 11.1–18.8)
IgA: 2210 mg/dL — ABNORMAL HIGH (ref 69–380)
IgG (Immunoglobin G), Serum: 291 mg/dL — ABNORMAL LOW (ref 690–1700)
IgM, Serum: 7 mg/dL — ABNORMAL LOW (ref 52–322)
M-Spike, %: 1.43 g/dL
Total Protein, Serum Electrophoresis: 7.1 g/dL (ref 6.0–8.3)

## 2014-04-04 LAB — KAPPA/LAMBDA LIGHT CHAINS
KAPPA LAMBDA RATIO: 29.2 — AB (ref 0.26–1.65)
Kappa free light chain: 4.38 mg/dL — ABNORMAL HIGH (ref 0.33–1.94)
Lambda Free Lght Chn: 0.15 mg/dL — ABNORMAL LOW (ref 0.57–2.63)

## 2014-04-07 ENCOUNTER — Other Ambulatory Visit: Payer: Self-pay | Admitting: *Deleted

## 2014-04-07 DIAGNOSIS — N08 Glomerular disorders in diseases classified elsewhere: Principal | ICD-10-CM

## 2014-04-07 DIAGNOSIS — C9000 Multiple myeloma not having achieved remission: Secondary | ICD-10-CM

## 2014-04-07 MED ORDER — POMALIDOMIDE 2 MG PO CAPS: 2.0000 mg | ORAL_CAPSULE | Freq: Every day | ORAL | Status: DC

## 2014-04-07 NOTE — Telephone Encounter (Signed)
Refill request for Pomalyst 2 mg to MD desk for approval.

## 2014-04-07 NOTE — Addendum Note (Signed)
Addended by: Tania Ade on: 04/07/2014 04:01 PM   Modules accepted: Orders

## 2014-04-07 NOTE — Telephone Encounter (Signed)
Left detailed message for patient to call [facility] for her survey before her med can be filled. Left phone #.

## 2014-04-08 NOTE — Telephone Encounter (Signed)
RECEIVED A FAX FROM [facility] CONCERNING A CONFIRMATION OF FACSIMILE RECEIPT FOR PT. REFERRAL. 

## 2014-04-17 NOTE — Telephone Encounter (Signed)
RECEIVED A FAX FROM [facility] CONCERNING A CONFIRMATION OF PRESCRIPTION SHIPMENT FOR POMALYST ON 04/16/14.

## 2014-05-01 ENCOUNTER — Telehealth: Payer: Self-pay | Admitting: *Deleted

## 2014-05-01 NOTE — Telephone Encounter (Signed)
Lab report received today to triage from collaborative nurse.  "Patient not found in system and not our patient" per [facility] RN.  Identified patient in EPIC with slightly different d.o.b.  Called [facility], identified myself and asked if she is currently living in her home or receiving rehab.  States she is a resident at [facility].  Asked her D.O.B and she confirmed 1926-01-20 as her D.O.B.  Faxed report of the 04-24-2014 lab results read d.o.b of 09-09-1923.  Patient does not believe [facility] is aware of her appointment on 05-05-2014 with Dr. Alvy Bimler.  [facility].  Spoke with Nurse [facility] and informed her of the appointment and D.O.B.  [facility] will ensure Christina [facility] with scheduling at [facility] is aware of the appointment at 0900 at [facility] on 05-05-2014.  Transferred to admissions and left voicemail for [facility] information about patient's correct d.o.b.

## 2014-05-01 NOTE — Telephone Encounter (Signed)
Christina [facility] with [facility] called and confirmed they have the correct D.O.B.  She will follow up with the [facility] to determine why the results have the incorrect D.O.B.

## 2014-05-02 ENCOUNTER — Other Ambulatory Visit: Payer: Self-pay | Admitting: *Deleted

## 2014-05-02 NOTE — Telephone Encounter (Signed)
THIS REFILL REQUEST FOR POMALYST WAS PLACED ON DR.GORSUCH'S DESK.

## 2014-05-03 ENCOUNTER — Other Ambulatory Visit: Payer: Self-pay | Admitting: Nurse Practitioner

## 2014-05-05 ENCOUNTER — Ambulatory Visit (HOSPITAL_BASED_OUTPATIENT_CLINIC_OR_DEPARTMENT_OTHER): Payer: Medicare Other | Admitting: Hematology and Oncology

## 2014-05-05 ENCOUNTER — Ambulatory Visit (HOSPITAL_BASED_OUTPATIENT_CLINIC_OR_DEPARTMENT_OTHER): Payer: Medicare Other | Admitting: Lab

## 2014-05-05 ENCOUNTER — Encounter: Payer: Self-pay | Admitting: Hematology and Oncology

## 2014-05-05 ENCOUNTER — Ambulatory Visit (HOSPITAL_BASED_OUTPATIENT_CLINIC_OR_DEPARTMENT_OTHER): Payer: Medicare Other

## 2014-05-05 ENCOUNTER — Telehealth: Payer: Self-pay | Admitting: *Deleted

## 2014-05-05 VITALS — BP 152/70 | HR 85 | Temp 98.4°F | Resp 18

## 2014-05-05 DIAGNOSIS — D638 Anemia in other chronic diseases classified elsewhere: Secondary | ICD-10-CM

## 2014-05-05 DIAGNOSIS — C9000 Multiple myeloma not having achieved remission: Secondary | ICD-10-CM

## 2014-05-05 DIAGNOSIS — C9002 Multiple myeloma in relapse: Secondary | ICD-10-CM

## 2014-05-05 DIAGNOSIS — G893 Neoplasm related pain (acute) (chronic): Secondary | ICD-10-CM | POA: Insufficient documentation

## 2014-05-05 LAB — CBC WITH DIFFERENTIAL/PLATELET
BASO%: 1.1 % (ref 0.0–2.0)
BASOS ABS: 0 10*3/uL (ref 0.0–0.1)
EOS%: 4 % (ref 0.0–7.0)
Eosinophils Absolute: 0.1 10*3/uL (ref 0.0–0.5)
HCT: 28.5 % — ABNORMAL LOW (ref 34.8–46.6)
HGB: 8.8 g/dL — ABNORMAL LOW (ref 11.6–15.9)
LYMPH#: 0.9 10*3/uL (ref 0.9–3.3)
LYMPH%: 27.2 % (ref 14.0–49.7)
MCH: 29.3 pg (ref 25.1–34.0)
MCHC: 30.8 g/dL — ABNORMAL LOW (ref 31.5–36.0)
MCV: 95 fL (ref 79.5–101.0)
MONO#: 0.4 10*3/uL (ref 0.1–0.9)
MONO%: 12.8 % (ref 0.0–14.0)
NEUT%: 54.9 % (ref 38.4–76.8)
NEUTROS ABS: 1.9 10*3/uL (ref 1.5–6.5)
Platelets: 202 10*3/uL (ref 145–400)
RBC: 3 10*6/uL — AB (ref 3.70–5.45)
RDW: 19.4 % — AB (ref 11.2–14.5)
WBC: 3.5 10*3/uL — ABNORMAL LOW (ref 3.9–10.3)

## 2014-05-05 LAB — COMPREHENSIVE METABOLIC PANEL (CC13)
ALK PHOS: 48 U/L (ref 40–150)
ALT: 13 U/L (ref 0–55)
AST: 12 U/L (ref 5–34)
Albumin: 2.7 g/dL — ABNORMAL LOW (ref 3.5–5.0)
Anion Gap: 11 mEq/L (ref 3–11)
BUN: 14.1 mg/dL (ref 7.0–26.0)
CALCIUM: 10.3 mg/dL (ref 8.4–10.4)
CHLORIDE: 98 meq/L (ref 98–109)
CO2: 33 mEq/L — ABNORMAL HIGH (ref 22–29)
Creatinine: 0.7 mg/dL (ref 0.6–1.1)
EGFR: 78 mL/min/{1.73_m2} — ABNORMAL LOW (ref >90)
Glucose: 129 mg/dl (ref 70–140)
POTASSIUM: 3.2 meq/L — AB (ref 3.5–5.1)
Sodium: 143 mEq/L (ref 136–145)
Total Bilirubin: 0.58 mg/dL (ref 0.20–1.20)
Total Protein: 7.8 g/dL (ref 6.4–8.3)

## 2014-05-05 MED ORDER — DARBEPOETIN ALFA 300 MCG/0.6ML IJ SOSY
300.0000 ug | PREFILLED_SYRINGE | Freq: Once | INTRAMUSCULAR | Status: AC
  Administered 2014-05-05: 300 ug via SUBCUTANEOUS
  Filled 2014-05-05: qty 0.6

## 2014-05-05 MED ORDER — MORPHINE SULFATE ER 15 MG PO TBCR: 15.0000 mg | EXTENDED_RELEASE_TABLET | Freq: Two times a day (BID) | ORAL | Status: AC

## 2014-05-05 NOTE — Telephone Encounter (Signed)
S/w nurse, Olu,  at [facility] regarding Dr. Alvy Bimler recommends Hospice referral for pt.. She says they will give request to their SW who will make the referral.  Asked her to call us if they need anything from Dr. Alvy Bimler.

## 2014-05-05 NOTE — Patient Instructions (Signed)
Darbepoetin Alfa injection What is this medicine? DARBEPOETIN ALFA (dar be POE e tin AL fa) helps your body make more red blood cells. It is used to treat anemia caused by chronic kidney failure and chemotherapy. This medicine may be used for other purposes; ask your health care provider or pharmacist if you have questions. COMMON BRAND NAME(S): Aranesp What should I tell my health care provider before I take this medicine? They need to know if you have any of these conditions: -blood clotting disorders or history of blood clots -cancer patient not on chemotherapy -cystic fibrosis -heart disease, such as angina, heart failure, or a history of a heart attack -hemoglobin level of 12 g/dL or greater -high blood pressure -low levels of folate, iron, or vitamin B12 -seizures -an unusual or allergic reaction to darbepoetin, erythropoietin, albumin, hamster proteins, latex, other medicines, foods, dyes, or preservatives -pregnant or trying to get pregnant -breast-feeding How should I use this medicine? This medicine is for injection into a vein or under the skin. It is usually given by a health care professional in a hospital or clinic setting. If you get this medicine at home, you will be taught how to prepare and give this medicine. Do not shake the solution before you withdraw a dose. Use exactly as directed. Take your medicine at regular intervals. Do not take your medicine more often than directed. It is important that you put your used needles and syringes in a special sharps container. Do not put them in a trash can. If you do not have a sharps container, call your pharmacist or healthcare provider to get one. Talk to your pediatrician regarding the use of this medicine in children. While this medicine may be used in children as young as 1 year for selected conditions, precautions do apply. Overdosage: If you think you have taken too much of this medicine contact a poison control center or  emergency room at once. NOTE: This medicine is only for you. Do not share this medicine with others. What if I miss a dose? If you miss a dose, take it as soon as you can. If it is almost time for your next dose, take only that dose. Do not take double or extra doses. What may interact with this medicine? Do not take this medicine with any of the following medications: -epoetin alfa This list may not describe all possible interactions. Give your health care provider a list of all the medicines, herbs, non-prescription drugs, or dietary supplements you use. Also tell them if you smoke, drink alcohol, or use illegal drugs. Some items may interact with your medicine. What should I watch for while using this medicine? Visit your prescriber or health care professional for regular checks on your progress and for the needed blood tests and blood pressure measurements. It is especially important for the doctor to make sure your hemoglobin level is in the desired range, to limit the risk of potential side effects and to give you the best benefit. Keep all appointments for any recommended tests. Check your blood pressure as directed. Ask your doctor what your blood pressure should be and when you should contact him or her. As your body makes more red blood cells, you may need to take iron, folic acid, or vitamin B supplements. Ask your doctor or health care provider which products are right for you. If you have kidney disease continue dietary restrictions, even though this medication can make you feel better. Talk with your doctor or health   care professional about the foods you eat and the vitamins that you take. What side effects may I notice from receiving this medicine? Side effects that you should report to your doctor or health care professional as soon as possible: -allergic reactions like skin rash, itching or hives, swelling of the face, lips, or tongue -breathing problems -changes in vision -chest  pain -confusion, trouble speaking or understanding -feeling faint or lightheaded, falls -high blood pressure -muscle aches or pains -pain, swelling, warmth in the leg -rapid weight gain -severe headaches -sudden numbness or weakness of the face, arm or leg -trouble walking, dizziness, loss of balance or coordination -seizures (convulsions) -swelling of the ankles, feet, hands -unusually weak or tired Side effects that usually do not require medical attention (report to your doctor or health care professional if they continue or are bothersome): -diarrhea -fever, chills (flu-like symptoms) -headaches -nausea, vomiting -redness, stinging, or swelling at site where injected This list may not describe all possible side effects. Call your doctor for medical advice about side effects. You may report side effects to FDA at 1-800-FDA-1088. Where should I keep my medicine? Keep out of the reach of children. Store in a refrigerator between 2 and 8 degrees C (36 and 46 degrees F). Do not freeze. Do not shake. Throw away any unused portion if using a single-dose vial. Throw away any unused medicine after the expiration date. NOTE: This sheet is a summary. It may not cover all possible information. If you have questions about this medicine, talk to your doctor, pharmacist, or health care provider.  2015, Elsevier/Gold Standard. (2008-04-15 10:23:57)  

## 2014-05-05 NOTE — Assessment & Plan Note (Signed)
This is likely anemia of chronic disease. The patient denies recent history of bleeding such as epistaxis, hematuria or hematochezia. She is asymptomatic from the anemia. We will observe for now.  She does not require transfusion now. She would like to get erythropoietin stimulating agent today before she leaves which I think is reasonable. I have discontinued vitamin B-12 injection as this is unlikely going to be helpful.

## 2014-05-05 NOTE — Assessment & Plan Note (Signed)
She has progressed on multiple agents. I discussed the risks, benefits, side effects of future chemotherapy. The expected response rate to be less than 20%. Currently, she has wary poor performance status with uncontrolled pain. She has poor quality of life. After extensive discussion, the patient elected to go on hospice care. I wrote down my recommendation back to her nursing home and hopefully they can arrange for hospice care for her at the end of her life. I have not make return appointment for the patient to come back.

## 2014-05-05 NOTE — Assessment & Plan Note (Signed)
Her pain is poorly controlled. I recommend addition of MS Contin. Hopefully, with referral to hospice taken take care of her pain from now on.

## 2014-05-05 NOTE — Progress Notes (Signed)
[facility] progress notes  Patient Care Team: [name], MD as PCP - General (Family Medicine)  CHIEF COMPLAINTS/PURPOSE OF VISIT:  Refractory multiple myeloma  HISTORY OF PRESENTING ILLNESS:  Christina Hopkins 80 y.o. female was transferred to my care after her prior physician has left.  I reviewed the patient's records extensive and collaborated the history with the patient. Summary of her history is as follows:  PAST THERAPY:  Revlimid (08/2009-08/2012)  Zometa (02/2010 - 02/2012)  Velcade (05/2011 -10/09/2013)  Cytoxan 08/2012 - 10/09/2013)  Decadron (08/2012 - present)   CURRENT THERAPY:  1. Pomalyst 2 mg daily for 1 - 21 days and then off 7 days started on 10/25/2013. She is in middle of cycle 6 03/22/14 through 04/11/14.  2. Decadron 20 mg weekly (every Wednesday) 3. Vitamin B12 mg IM monthly.  4. Aranesp 300 mcg subcu every 4 weeks for hemoglobin less than or equal to 10.   She returns to follow-up on her treatment for multiple myeloma. She has worsening back pain with profound weakness. She is rating her pain as 6 out of 10 pain, uncontrolled with current pain regimen. She has shortness of breath on minimal exertion and is almost wheelchair-bound. She is not participating well with rehabilitation. She had poor appetite. She denies any  fevers or chills.    MEDICAL HISTORY:  Past Medical History  Diagnosis Date  . Hypertension   . Diabetes mellitus   . Anemia   . Osteoporosis   . Dyslipidemia   . Myocardial infarction   . Coronary artery disease   . Shortness of breath   . GERD (gastroesophageal reflux disease)   . Wears glasses   . colon ca dx'd 2003    surg only  . Multiple myeloma dx'd 2009    oral chemo ongoing    SURGICAL HISTORY: Past Surgical History  Procedure Laterality Date  . Coronary artery bypass graft  08/2001    4 vessel  . Colon surgery  2003  . Tonsillectomy    . Appendectomy    . Abdominal hysterectomy    .  Back surgery    . Eye surgery      cataracts  . Direct laryngoscopy with radiaesse injection Left 08/13/2012    Procedure: LEFT VOCAL CORD RADIESSE AUGMENTATION LARYNGOSCOPY ;  Surgeon: Jodi Marble, MD;  Location: [facility];  Service: ENT;  Laterality: Left;  . Colonoscopy N/A 06/13/2013    Procedure: COLONOSCOPY;  Surgeon: Jeryl Columbia, MD;  Location: [facility];  Service: Endoscopy;  Laterality: N/A;    SOCIAL HISTORY: History   Social History  . Marital Status: Widowed    Spouse Name: N/A    Number of Children: N/A  . Years of Education: N/A   Occupational History  . Not on file.   Social History Main Topics  . Smoking status: Never Smoker   . Smokeless tobacco: Never Used  . Alcohol Use: No  . Drug Use: No  . Sexual Activity: No   Other Topics Concern  . Not on file   Social History Narrative    FAMILY HISTORY: Family History  Problem Relation Age of Onset  . Heart disease Brother   . Diabetes Brother     ALLERGIES:  is allergic to gemfibrozil; nifedipine; and questran.  MEDICATIONS:  Current Outpatient Prescriptions  Medication Sig Dispense Refill  . acyclovir (ZOVIRAX) 400 MG tablet Take 400 mg by mouth 2 (two) times daily.    . Alum &  Mag Hydroxide-Simeth (MAGIC MOUTHWASH W/LIDOCAINE) SOLN Take 5 mLs by mouth 3 (three) times daily as needed for mouth pain. 240 mL 0  . busPIRone (BUSPAR) 5 MG tablet Take 5 mg by mouth 2 (two) times daily.    . carvedilol (COREG) 25 MG tablet Take 25 mg by mouth 2 (two) times daily with a meal.    . cholecalciferol (VITAMIN D) 1000 UNITS tablet Take 5,000 Units by mouth daily.    Marland Kitchen dexamethasone (DECADRON) 4 MG tablet Take 20 mg by mouth once a week. On Wednesdays    . HYDROcodone-acetaminophen (NORCO/VICODIN) 5-325 MG per tablet Take one tablet by mouth every 6 hours scheduled for pain. Do not exceed 3gm apap/24hr 120 tablet 0  . lidocaine (XYLOCAINE) 2 % solution Use as directed 20 mLs in the mouth or  throat 3 (three) times daily.    Marland Kitchen lisinopril (PRINIVIL,ZESTRIL) 2.5 MG tablet Take 2.5 mg by mouth every other day.    . magnesium oxide (MAG-OX) 400 MG tablet Take 1 tablet (400 mg total) by mouth 2 (two) times daily. 60 tablet 1  . metFORMIN (GLUCOPHAGE) 500 MG tablet Take 500 mg by mouth 2 (two) times daily with a meal.      . mirtazapine (REMERON) 15 MG tablet Take 7.5 mg by mouth at bedtime.    . Multiple Vitamin (MULTIVITAMIN WITH MINERALS) TABS Take 1 tablet by mouth daily.    Marland Kitchen nystatin (MYCOSTATIN) 100000 UNIT/ML suspension Take 5 mLs by mouth 3 (three) times daily as needed.    . pantoprazole (PROTONIX) 40 MG tablet Take 40 mg by mouth daily.      . pomalidomide (POMALYST) 2 MG capsule Take 1 capsule (2 mg total) by mouth daily. Take with water on days 1-21. Repeat every 28 days. 21 capsule 0  . sodium chloride 1 G tablet Take 1 g by mouth 2 (two) times daily with a meal.    . bisacodyl (DULCOLAX) 10 MG suppository Place 10 mg rectally daily as needed for moderate constipation.    . calcium carbonate (TUMS - DOSED IN MG ELEMENTAL CALCIUM) 500 MG chewable tablet Chew 1 tablet by mouth daily.    . Camphor-Menthol-Capsicum (TIGER BALM PAIN RELIEVING) 80-24-16 MG PTCH Apply 1 patch topically daily as needed (pain).    . cyanocobalamin (,VITAMIN B-12,) 1000 MCG/ML injection Inject 1,000 mcg into the muscle every 30 (thirty) days.    . cyclobenzaprine (FLEXERIL) 10 MG tablet Take 10 mg by mouth 3 (three) times daily as needed for muscle spasms.    Marland Kitchen loperamide (IMODIUM) 2 MG capsule Take 2 mg by mouth 3 (three) times daily as needed for diarrhea or loose stools.     . magnesium hydroxide (MILK OF MAGNESIA) 400 MG/5ML suspension Take by mouth daily as needed for mild constipation.    Marland Kitchen morphine (MS CONTIN) 15 MG 12 hr tablet Take 1 tablet (15 mg total) by mouth every 12 (twelve) hours. 60 tablet 0  . Polyethyl Glycol-Propyl Glycol (SYSTANE) 0.4-0.3 % SOLN Place 1 drop into both eyes 3 (three)  times daily as needed (dry eyes).    . prochlorperazine (COMPAZINE) 10 MG tablet Take 10 mg by mouth every 6 (six) hours as needed for nausea or vomiting.     No current facility-administered medications for this visit.    REVIEW OF SYSTEMS:   Constitutional: Denies fevers, chills or abnormal night sweats Eyes: Denies blurriness of vision, double vision or watery eyes Ears, nose, mouth, throat, and face: Denies mucositis  or sore throat Respiratory: Denies cough, dyspnea or wheezes Cardiovascular: Denies palpitation, chest discomfort or lower extremity swelling Gastrointestinal:  Denies nausea, heartburn or change in bowel habits Skin: Denies abnormal skin rashes Lymphatics: Denies new lymphadenopathy  Neurological:Denies numbness, tingling  Behavioral/Psych: Mood is stable, no new changes  All other systems were reviewed with the patient and are negative.  PHYSICAL EXAMINATION: ECOG PERFORMANCE STATUS: 3 - Symptomatic, >50% confined to bed  Filed Vitals:   05/05/14 0935  BP: 152/70  Pulse: 85  Temp: 98.4 F (36.9 C)  Resp: 18   Filed Weights    GENERAL:alert, no distress and comfortable. She looks very debilitated, sitting on the wheelchair SKIN: skin color, texture, turgor are normal, no rashes or significant lesions EYES: normal, conjunctiva are pale and non-injected, sclera clear OROPHARYNX:no exudate, normal lips, buccal mucosa, and tongue; dry mucous membrane NECK: supple, thyroid normal size, non-tender, without nodularity LYMPH:  no palpable lymphadenopathy in the cervical, axillary or inguinal LUNGS: clear to auscultation and percussion with normal breathing effort HEART: regular rate & rhythm and no murmurs without lower extremity edema ABDOMEN:abdomen soft, non-tender and normal bowel sounds Musculoskeletal:no cyanosis of digits and no clubbing  PSYCH: alert & oriented x 3 with fluent speech  LABORATORY DATA:  I have reviewed the data as listed Lab Results   Component Value Date   WBC 3.5* 05/05/2014   HGB 8.8* 05/05/2014   HCT 28.5* 05/05/2014   MCV 95.0 05/05/2014   PLT 202 05/05/2014    Recent Labs  11/09/13 1516 11/10/13 0432 11/11/13 0524  03/05/14 0940 04/02/14 0935 05/05/14 0917  NA 129* 132* 132*  < > 139 139 143  K 4.0 3.8 3.7  < > 3.7 3.8 3.2*  CL 90* 92* 93*  --   --   --   --   CO2 _0 < > 32* 31* 33*  GLUCOSE 157* 117* 133*  < > 163* 128 129  BUN _1 < > 10.6 12.7 14.1  CREATININE 0.70 0.56 0.56  < > 0.7 0.7 0.7  CALCIUM 9.1 8.8 8.8  < > 10.0 9.9 10.3  GFRNONAA 76* 82* 82*  --   --   --   --   GFRAA 88* >90 >90  --   --   --   --   PROT  --   --  6.6  < > 6.9 7.3 7.8  ALBUMIN  --   --  2.1*  < > 2.9* 2.8* 2.7*  AST  --   --  11  < > _2 ALT  --   --  7  < > 7 <6 13  ALKPHOS  --   --  40  < > 49 47 48  BILITOT  --   --  0.3  < > 0.31 0.29 0.58  < > = values in this interval not displayed.  ASSESSMENT & PLAN:  Multiple myeloma She has progressed on multiple agents. I discussed the risks, benefits, side effects of future chemotherapy. The expected response rate to be less than 20%. Currently, she has wary poor performance status with uncontrolled pain. She has poor quality of life. After extensive discussion, the patient elected to go on hospice care. I wrote down my recommendation back to her nursing home and hopefully they can arrange for hospice care for her at the end of her life. I have not make return appointment for the patient to come back.  Anemia of chronic disease This is likely anemia of chronic disease. The patient denies recent history of bleeding such as epistaxis, hematuria or hematochezia. She is asymptomatic from the anemia. We will observe for now.  She does not require transfusion now. She would like to get erythropoietin stimulating agent today before she leaves which I think is reasonable. I have discontinued vitamin B-12 injection as this is unlikely going to be  helpful.  Cancer associated pain Her pain is poorly controlled. I recommend addition of MS Contin. Hopefully, with referral to hospice taken take care of her pain from now on.   No orders of the defined types were placed in this encounter.    All questions were answered. The patient knows to call the clinic with any problems, questions or concerns. I spent 25 minutes counseling the patient face to face. The total time spent in the appointment was 30 minutes and more than 50% was on counseling.     [facility], [place], MD 05/05/2014 8:37 PM

## 2014-05-06 NOTE — Addendum Note (Signed)
Addended by: Wyonia Hough on: 05/06/2014 10:55 AM   Modules accepted: Orders, Medications

## 2014-05-06 NOTE — Telephone Encounter (Signed)
PT. HAS ELECTED HOSPICE PER DR.GORSUCH'S DICTATION OF 05/05/14. POMALYST HAS BEEN DISCONTINUED.

## 2014-05-07 LAB — SPEP & IFE WITH QIG
ALPHA-2-GLOBULIN: 12.8 % — AB (ref 7.1–11.8)
Albumin ELP: 37.7 % — ABNORMAL LOW (ref 55.8–66.1)
Alpha-1-Globulin: 5.6 % — ABNORMAL HIGH (ref 2.9–4.9)
BETA GLOBULIN: 5.2 % (ref 4.7–7.2)
Beta 2: 33.5 % — ABNORMAL HIGH (ref 3.2–6.5)
Gamma Globulin: 5.2 % — ABNORMAL LOW (ref 11.1–18.8)
IGG (IMMUNOGLOBIN G), SERUM: 271 mg/dL — AB (ref 690–1700)
IgA: 2290 mg/dL — ABNORMAL HIGH (ref 69–380)
IgM, Serum: 7 mg/dL — ABNORMAL LOW (ref 52–322)
M-Spike, %: 1.79 g/dL
TOTAL PROTEIN, SERUM ELECTROPHOR: 7.7 g/dL (ref 6.0–8.3)

## 2014-05-07 LAB — KAPPA/LAMBDA LIGHT CHAINS
KAPPA LAMBDA RATIO: 6.37 — AB (ref 0.26–1.65)
Kappa free light chain: 3.31 mg/dL — ABNORMAL HIGH (ref 0.33–1.94)
LAMBDA FREE LGHT CHN: 0.52 mg/dL — AB (ref 0.57–2.63)

## 2014-05-08 ENCOUNTER — Non-Acute Institutional Stay (SKILLED_NURSING_FACILITY): Payer: Medicare Other | Admitting: Internal Medicine

## 2014-05-08 DIAGNOSIS — R627 Adult failure to thrive: Secondary | ICD-10-CM

## 2014-05-08 DIAGNOSIS — C9000 Multiple myeloma not having achieved remission: Secondary | ICD-10-CM

## 2014-05-08 NOTE — Progress Notes (Signed)
Patient ID: Christina Hopkins, female   DOB: 1925-06-17, 80 y.o.   MRN: [id number]   This is an acute visit.  Level care skilled.  [facility].  Chief complaint  Acute visit secondary to failure to thrive-end-stage multiple myeloma-  History of present illness.  Patient is a pleasant 80 year old female with a complex medical history including long-term multiple myeloma-she has been followed by oncology.  She also has a history of diabetes type 2 coronary artery disease and CHF.  She has declined markedly here the last few days-she has been evaluated by oncology-most recently on December 21-recommendation for hospice care for end-of-life issues-no further aggressive workup-she was also started on morphine.  She was evaluated by Dr. Sheppard Coil a couple days ago and morphine was changed to 5 mg every 4 hours when necessary pain.  According to nursing staff she is still having some discomfort-I do note she also has been started on a Duragesic patch as of 2 days ago as well as.  On my evaluation today patient has markedly declined from when I last saw her-she appears to be entering the end stages of life-apparently is eating minimally and actually has had some vomiting episodes when trying D-she continues on numerous medications with her numerous medical issues but I suspect these can be discontinued since these do not appear to be adding any quality of life and it is somewhat suspect, much she is actually retaining of these-.  I did discuss this with her daughters including her responsible party who are in agreement with this.  Family medical social history as been reviewed per progress notes including 01/23/2014.  Medications have been reviewed per [facility] medications include magnesium-a cycle of air sodium chloride vitamin D fosinopril-dexamethasone-Remeron-protonix-Glucophage-Coreg-BuSpar.  Review of systems-this is essentially unattainable since patient is largely nonverbal-according in  nursing staff she appears to be comfortable but does have episodes of breakthrough pain and feel she would benefit from more frequent dosing of the morphine.  Physical exam . Enter is 98.3 pulse 94 respirations 20 blood pressure 143/62  In general this is a very frail elderly female who has declined markedly since I last saw her she is resting in bed does not appear to be uncomfortable however.  Her skin is warm and dry she is quite pale.  Oropharynx mucous membranes at this point appear fairly moist family has been getting her fluids as tolerated.  Chest poor respiratory effort shallow air entry but no labored breathing.  Heart is regular rate and rhythm without murmur gallop or rub she does not really have significant lower extremity edema.  Abdomen is soft protuberant some mild tenderness to palpation I suspect this is more a reaction to the invasive maneuver.  Musculoskeletal extremely frail all 4 extremities is able to move all time for but again she is very weak.  Psych she is responsive does not really speak-does not recognize me again this is a marked change from when I last saw her.  Labs.  04/15/2014.  WBC 4.9 hemoglobin 9.1 platelets 248.  Sodium 138 potassium 4.1 BUN 10 creatinine 0.6.  Assessment plan.  End-stage multiple myeloma-failure to thrive-pain issues-I did have a discussion with her daughter at bedside as well as with her responsible party-Christina Hopkins via phone-emphasis again is on comfort and preserving any quality of life remaining.  With that in mind we'll discontinue all non-pertinent medications-certainly will continue pain medications  the Duragesic patch as well as morphine-will increase the morphine to every 2 hours when necessary  pain.  Hospice consult also is pending we will discuss with nursing about the timetable for this-hopefully they can be, involved as soon as possible.  At this point patient does not appear to be uncomfortable but is declining  quite rapidly-continue to monitor-.  Addendum I did later reevaluate patient-she appeared to be somewhat un comfortable anxious agitated-will add when necessary Ativan as needed for anxiety and agitation-area  Physical exam was essentially unchanged except patient appeared to be more uncomfortable fidgety-I feel she would benefit significantly with the addition of Ativan--- again the priority here is comfort   [phone]

## 2014-05-11 ENCOUNTER — Encounter: Payer: Self-pay | Admitting: Internal Medicine

## 2014-05-11 ENCOUNTER — Non-Acute Institutional Stay (SKILLED_NURSING_FACILITY): Payer: Medicare Other | Admitting: Internal Medicine

## 2014-05-11 DIAGNOSIS — Z710 Person encountering health services to consult on behalf of another person: Secondary | ICD-10-CM | POA: Insufficient documentation

## 2014-05-11 DIAGNOSIS — G893 Neoplasm related pain (acute) (chronic): Secondary | ICD-10-CM

## 2014-05-11 DIAGNOSIS — C9000 Multiple myeloma not having achieved remission: Secondary | ICD-10-CM

## 2014-05-11 DIAGNOSIS — D638 Anemia in other chronic diseases classified elsewhere: Secondary | ICD-10-CM

## 2014-05-11 NOTE — Assessment & Plan Note (Signed)
Spoke with daughter at length. She is a Marine scientist, she has understanding. She agrees with Hospice. We discussed pain medication, she works with the pharmacy, and have decided on fentanyl and MS liquid.

## 2014-05-11 NOTE — Assessment & Plan Note (Signed)
I have reviewed Onc note. Christina Hopkins and I have spoken frankly to each other. She fully grasps the situation and is appropriately sad, with some crying but we sat together for a while at her request.She admits she is not afraid. She is in pain but is slow to state that. She agrees to [facility] consult. Comfort is the goal.

## 2014-05-11 NOTE — Assessment & Plan Note (Signed)
MS contin 15 mg was written per Onc and is finally here at SNF and pt has been just given her first dose along with a norco. Fentanyl patch has been ordered along with liquid morphine after talking with pt's daughter-pt has difficulty swallowing.Pt can have anything she needs.

## 2014-05-11 NOTE — Progress Notes (Signed)
MRN: [id number] Name: Christina Hopkins  Sex: female Age: 80 y.o. DOB: 1925-07-31  [place] #: [facility] Facility/Room:418 Level Of Care: SNF Provider: Inocencio Homes D Emergency Contacts: Extended Emergency Contact Information Primary Emergency Contact: [facility] Address: [address]          [place], [place] [address] Johnnette Litter of [place] Phone: [phone] Mobile Phone: [phone] Relation: Daughter Secondary Emergency Contact: Renee Harder [place] Mobile Phone: [phone] Relation: Son  Code Status: DNR  Allergies: Gemfibrozil; Nifedipine; and Questran  Chief Complaint  Patient presents with  . Acute Visit    HPI: Patient is 80 y.o. female who is being seen today for change in condition.Pt was seen yesterday by Onc and her multiple myeloma was found to be not resonding to tx. Pt's functional status has declined significantly and she is in pain.  Past Medical History  Diagnosis Date  . Hypertension   . Diabetes mellitus   . Anemia   . Osteoporosis   . Dyslipidemia   . Myocardial infarction   . Coronary artery disease   . Shortness of breath   . GERD (gastroesophageal reflux disease)   . Wears glasses   . colon ca dx'd 2003    surg only  . Multiple myeloma dx'd 2009    oral chemo ongoing    Past Surgical History  Procedure Laterality Date  . Coronary artery bypass graft  08/2001    4 vessel  . Colon surgery  2003  . Tonsillectomy    . Appendectomy    . Abdominal hysterectomy    . Back surgery    . Eye surgery      cataracts  . Direct laryngoscopy with radiaesse injection Left 08/13/2012    Procedure: LEFT VOCAL CORD RADIESSE AUGMENTATION LARYNGOSCOPY ;  Surgeon: Jodi Marble, MD;  Location: [facility];  Service: ENT;  Laterality: Left;  . Colonoscopy N/A 06/13/2013    Procedure: COLONOSCOPY;  Surgeon: Jeryl Columbia, MD;  Location: [facility];  Service: Endoscopy;  Laterality: N/A;      Medication List      This list is accurate as of: 05/11/14  3:11 PM.  Always use your most recent med list.               acyclovir 400 MG tablet  Commonly known as:  ZOVIRAX  Take 400 mg by mouth 2 (two) times daily.     bisacodyl 10 MG suppository  Commonly known as:  DULCOLAX  Place 10 mg rectally daily as needed for moderate constipation.     busPIRone 5 MG tablet  Commonly known as:  BUSPAR  Take 5 mg by mouth 2 (two) times daily.     calcium carbonate 500 MG chewable tablet  Commonly known as:  TUMS - dosed in mg elemental calcium  Chew 1 tablet by mouth daily.     carvedilol 25 MG tablet  Commonly known as:  COREG  Take 25 mg by mouth 2 (two) times daily with a meal.     cholecalciferol 1000 UNITS tablet  Commonly known as:  VITAMIN D  Take 5,000 Units by mouth daily.     cyanocobalamin 1000 MCG/ML injection  Commonly known as:  (VITAMIN B-12)  Inject 1,000 mcg into the muscle every 30 (thirty) days.     cyclobenzaprine 10 MG tablet  Commonly known as:  FLEXERIL  Take 10 mg by mouth 3 (three) times daily as needed for muscle spasms.  dexamethasone 4 MG tablet  Commonly known as:  DECADRON  Take 20 mg by mouth once a week. On Wednesdays     HYDROcodone-acetaminophen 5-325 MG per tablet  Commonly known as:  NORCO/VICODIN  Take one tablet by mouth every 6 hours scheduled for pain. Do not exceed 3gm apap/24hr     lidocaine 2 % solution  Commonly known as:  XYLOCAINE  Use as directed 20 mLs in the mouth or throat 3 (three) times daily.     lisinopril 2.5 MG tablet  Commonly known as:  PRINIVIL,ZESTRIL  Take 2.5 mg by mouth every other day.     loperamide 2 MG capsule  Commonly known as:  IMODIUM  Take 2 mg by mouth 3 (three) times daily as needed for diarrhea or loose stools.     magic mouthwash w/lidocaine Soln  Take 5 mLs by mouth 3 (three) times daily as needed for mouth pain.     magnesium hydroxide 400 MG/5ML suspension  Commonly known as:  MILK OF MAGNESIA  Take  by mouth daily as needed for mild constipation.     magnesium oxide 400 MG tablet  Commonly known as:  MAG-OX  Take 1 tablet (400 mg total) by mouth 2 (two) times daily.     metFORMIN 500 MG tablet  Commonly known as:  GLUCOPHAGE  Take 500 mg by mouth 2 (two) times daily with a meal.     mirtazapine 15 MG tablet  Commonly known as:  REMERON  Take 7.5 mg by mouth at bedtime.     morphine 15 MG 12 hr tablet  Commonly known as:  MS CONTIN  Take 1 tablet (15 mg total) by mouth every 12 (twelve) hours.     multivitamin with minerals Tabs tablet  Take 1 tablet by mouth daily.     nystatin 100000 UNIT/ML suspension  Commonly known as:  MYCOSTATIN  Take 5 mLs by mouth 3 (three) times daily as needed.     pantoprazole 40 MG tablet  Commonly known as:  PROTONIX  Take 40 mg by mouth daily.     prochlorperazine 10 MG tablet  Commonly known as:  COMPAZINE  Take 10 mg by mouth every 6 (six) hours as needed for nausea or vomiting.     sodium chloride 1 G tablet  Take 1 g by mouth 2 (two) times daily with a meal.     SYSTANE 0.4-0.3 % Soln  Generic drug:  Polyethyl Glycol-Propyl Glycol  Place 1 drop into both eyes 3 (three) times daily as needed (dry eyes).     TIGER BALM PAIN RELIEVING 80-24-16 MG Ptch  Generic drug:  Camphor-Menthol-Capsicum  Apply 1 patch topically daily as needed (pain).        No orders of the defined types were placed in this encounter.    Immunization History  Administered Date(s) Administered  . Influenza-Unspecified 02/09/2011, 01/27/2012, 02/25/2014  . Pneumococcal Conjugate-13 02/22/2005    History  Substance Use Topics  . Smoking status: Never Smoker   . Smokeless tobacco: Never Used  . Alcohol Use: No    Review of Systems  DATA OBTAINED: from patient, nurse, medical record, daughter GENERAL:  no fevers,+ fatigue, no appetite  SKIN: No itching, rash HEENT: No complaint RESPIRATORY: No cough, wheezing, SOB CARDIAC: No chest pain,  palpitations, lower extremity edema  GI: No abdominal pain, No N/V/D or constipation, No heartburn or reflux + pain GU: No dysuria, frequency or urgency, or incontinence  MUSCULOSKELETAL: + unrelieved bone/joint  pain NEUROLOGIC: No headache, dizziness  PSYCHIATRIC: + sadness  Filed Vitals:   05/11/14 1433  BP: 181/84  Pulse: 95  Temp: 98.4 F (36.9 C)  Resp: 22    Physical Exam  GENERAL APPEARANCE: Alert, conversant, appears uncomfortable  SKIN: No diaphoresis rash, or wounds HEENT: Pt has vocal cord problem which makes her voice very soft and hoarse so she speaks slowly RESPIRATORY: Breathing is even, unlabored. Lung sounds are clear   CARDIOVASCULAR: Heart RRR no murmurs, rubs or gallops. No peripheral edema  GASTROINTESTINAL: Abdomen is soft, +tender, not distended w/ normal bowel sounds.  GENITOURINARY: Bladder non tender, not distended  MUSCULOSKELETAL: No abnormal joints or musculature NEUROLOGIC: Cranial nerves 2-12 grossly intact. Moves all extremities PSYCHIATRIC: tearful some, which is appropriate  Patient Active Problem List   Diagnosis Date Noted  . Encounter for family conference without patient present 05/11/2014  . FTT (failure to thrive) in adult 05/08/2014  . Cancer associated pain 05/05/2014  . Edema 01/16/2014  . Greater trochanter fracture 11/09/2013  . Hip fracture 11/09/2013  . Abdominal pain, epigastric ventral hernia 06/12/2013  . Hiatal hernia 06/12/2013  . History of colon cancer 06/12/2013  . Chronic combined systolic and diastolic congestive heart failure 06/07/2013  . Hypokalemia 06/06/2013  . GI bleed 06/06/2013  . Hyperlipidemia 03/30/2013  . Hypomagnesemia 03/26/2013  . Hyponatremia 03/24/2013  . Moderate protein-calorie malnutrition 03/24/2013  . Fall 03/23/2013  . Fracture of multiple ribs 03/23/2013  . Magnesium deficiency 04/05/2011  . Diabetes type 2, controlled 04/05/2011  . Hypertension 04/05/2011  . Coronary artery disease  with history of MI 04/05/2011  . Multiple myeloma 04/02/2011  . Anemia of chronic disease 04/02/2011    CBC    Component Value Date/Time   WBC 3.5* 05/05/2014 0917   WBC 7.6 11/09/2013 1516   RBC 3.00* 05/05/2014 0917   RBC 2.86* 11/09/2013 1516   HGB 8.8* 05/05/2014 0917   HGB 9.2* 11/09/2013 1516   HCT 28.5* 05/05/2014 0917   HCT 27.7* 11/09/2013 1516   PLT 202 05/05/2014 0917   PLT 181 11/09/2013 1516   MCV 95.0 05/05/2014 0917   MCV 96.9 11/09/2013 1516   LYMPHSABS 0.9 05/05/2014 0917   LYMPHSABS 0.9 06/06/2013 0957   MONOABS 0.4 05/05/2014 0917   MONOABS 0.4 06/06/2013 0957   EOSABS 0.1 05/05/2014 0917   EOSABS 0.0 06/06/2013 0957   BASOSABS 0.0 05/05/2014 0917   BASOSABS 0.0 06/06/2013 0957    CMP     Component Value Date/Time   NA 143 05/05/2014 0917   NA 132* 11/11/2013 0524   K 3.2* 05/05/2014 0917   K 3.7 11/11/2013 0524   CL 93* 11/11/2013 0524   CL 101 10/18/2012 1051   CO2 33* 05/05/2014 0917   CO2 27 11/11/2013 0524   GLUCOSE 129 05/05/2014 0917   GLUCOSE 133* 11/11/2013 0524   GLUCOSE 153* 10/18/2012 1051   BUN 14.1 05/05/2014 0917   BUN 14 11/11/2013 0524   CREATININE 0.7 05/05/2014 0917   CREATININE 0.56 11/11/2013 0524   CREATININE 0.69 08/11/2009 1145   CALCIUM 10.3 05/05/2014 0917   CALCIUM 8.8 11/11/2013 0524   PROT 7.8 05/05/2014 0917   PROT 6.6 11/11/2013 0524   ALBUMIN 2.7* 05/05/2014 0917   ALBUMIN 2.1* 11/11/2013 0524   AST 12 05/05/2014 0917   AST 11 11/11/2013 0524   ALT 13 05/05/2014 0917   ALT 7 11/11/2013 0524   ALKPHOS 48 05/05/2014 0917   ALKPHOS 40 11/11/2013 0524  BILITOT 0.58 05/05/2014 0917   BILITOT 0.3 11/11/2013 0524   GFRNONAA 82* 11/11/2013 0524   GFRAA >90 11/11/2013 0524    Assessment and Plan  Multiple myeloma I have reviewed Onc note. Mrs Kastens and I have spoken frankly to each other. She fully grasps the situation and is appropriately sad, with some crying but we sat together for a while at her  request.She admits she is not afraid. She is in pain but is slow to state that. She agrees to [facility] consult. Comfort is the goal.  Cancer associated pain MS contin 15 mg was written per Onc and is finally here at SNF and pt has been just given her first dose along with a norco. Fentanyl patch has been ordered along with liquid morphine after talking with pt's daughter-pt has difficulty swallowing.Pt can have anything she needs.  Anemia of chronic disease As per Onc note we will monitor pending what next few weeks bring.  Encounter for family conference without patient present Spoke with daughter at length. She is a [employer] scientist, she has understanding. She agrees with [facility]osp[facility]. We discussed pain medication, she works with the pharmacy, and have decided on fentanyl and MS liquid.  Timw spent with pt, family, nursing, records > 45 min Pt seen 05/06/2014 Hennie Duos, MD

## 2014-05-11 NOTE — Assessment & Plan Note (Signed)
As per Onc note we will monitor pending what next few weeks bring.

## 2014-05-12 ENCOUNTER — Other Ambulatory Visit: Payer: Self-pay

## 2014-05-12 ENCOUNTER — Other Ambulatory Visit: Payer: Self-pay | Admitting: Hematology and Oncology

## 2014-05-12 DIAGNOSIS — C9000 Multiple myeloma not having achieved remission: Secondary | ICD-10-CM

## 2014-05-12 MED ORDER — MORPHINE SULFATE (CONCENTRATE) 20 MG/ML PO SOLN: ORAL | Status: AC

## 2014-05-12 NOTE — Telephone Encounter (Signed)
RX sent to [facility] @ [phone]. Phone number [phone]  

## 2014-05-16 DEATH — deceased

## 2014-05-21 ENCOUNTER — Telehealth: Payer: Self-pay | Admitting: *Deleted

## 2014-05-21 NOTE — Telephone Encounter (Signed)
Dau requests copy of recent lab work on pt.. Pt passed away on Christmas day according to daughter.  Dr. Alvy Bimler is aware.  Labs placed in mail to [place], pt's daughter.
